# Patient Record
Sex: Male | Born: 1955 | Race: White | Hispanic: No | Smoking: Current every day smoker
Health system: Southern US, Community
[De-identification: ages and names within clinical notes are randomized; demographics above are authoritative.]

## PROBLEM LIST (undated history)

## (undated) DIAGNOSIS — F32A Depression, unspecified: Secondary | ICD-10-CM

## (undated) DIAGNOSIS — D649 Anemia, unspecified: Secondary | ICD-10-CM

## (undated) DIAGNOSIS — K219 Gastro-esophageal reflux disease without esophagitis: Secondary | ICD-10-CM

## (undated) DIAGNOSIS — Z950 Presence of cardiac pacemaker: Secondary | ICD-10-CM

## (undated) DIAGNOSIS — F17200 Nicotine dependence, unspecified, uncomplicated: Secondary | ICD-10-CM

## (undated) DIAGNOSIS — L0390 Cellulitis, unspecified: Secondary | ICD-10-CM

## (undated) DIAGNOSIS — R51 Headache: Secondary | ICD-10-CM

## (undated) DIAGNOSIS — M1990 Unspecified osteoarthritis, unspecified site: Secondary | ICD-10-CM

## (undated) DIAGNOSIS — I4430 Unspecified atrioventricular block: Secondary | ICD-10-CM

## (undated) DIAGNOSIS — L259 Unspecified contact dermatitis, unspecified cause: Secondary | ICD-10-CM

## (undated) DIAGNOSIS — I739 Peripheral vascular disease, unspecified: Secondary | ICD-10-CM

## (undated) DIAGNOSIS — I442 Atrioventricular block, complete: Secondary | ICD-10-CM

## (undated) DIAGNOSIS — R0789 Other chest pain: Secondary | ICD-10-CM

## (undated) DIAGNOSIS — I10 Essential (primary) hypertension: Secondary | ICD-10-CM

## (undated) DIAGNOSIS — R9389 Abnormal findings on diagnostic imaging of other specified body structures: Secondary | ICD-10-CM

## (undated) DIAGNOSIS — J449 Chronic obstructive pulmonary disease, unspecified: Secondary | ICD-10-CM

## (undated) HISTORY — DX: Peripheral vascular disease, unspecified: I73.9

## (undated) HISTORY — DX: Unspecified contact dermatitis, unspecified cause: L25.9

## (undated) HISTORY — DX: Abnormal findings on diagnostic imaging of other specified body structures: R93.89

## (undated) HISTORY — DX: Cellulitis, unspecified: L03.90

## (undated) HISTORY — DX: Depression, unspecified: F32.A

## (undated) HISTORY — DX: Atrioventricular block, complete: I44.2

## (undated) HISTORY — DX: Unspecified atrioventricular block: I44.30

## (undated) HISTORY — DX: Unspecified osteoarthritis, unspecified site: M19.90

## (undated) HISTORY — DX: Chronic obstructive pulmonary disease, unspecified: J44.9

## (undated) HISTORY — DX: Other chest pain: R07.89

## (undated) HISTORY — DX: Headache: R51

## (undated) HISTORY — DX: Nicotine dependence, unspecified, uncomplicated: F17.200

## (undated) HISTORY — DX: Presence of cardiac pacemaker: Z95.0

## (undated) HISTORY — DX: Gastro-esophageal reflux disease without esophagitis: K21.9

## (undated) HISTORY — PX: CATARACT EXTRACTION: SUR2

## (undated) HISTORY — PX: MOUTH SURGERY: SHX715

## (undated) HISTORY — DX: Anemia, unspecified: D64.9

## (undated) HISTORY — DX: Essential (primary) hypertension: I10

---

## 1998-05-08 HISTORY — PX: OTHER SURGICAL HISTORY: SHX169

## 1998-05-31 ENCOUNTER — Ambulatory Visit (HOSPITAL_COMMUNITY): Admission: RE | Admit: 1998-05-31 | Discharge: 1998-06-01 | Payer: Self-pay | Admitting: General Surgery

## 1998-05-31 ENCOUNTER — Encounter: Payer: Self-pay | Admitting: General Surgery

## 2000-05-25 ENCOUNTER — Encounter: Admission: RE | Admit: 2000-05-25 | Discharge: 2000-05-25 | Payer: Self-pay | Admitting: Pulmonary Disease

## 2000-05-25 ENCOUNTER — Encounter: Payer: Self-pay | Admitting: Pulmonary Disease

## 2003-06-12 ENCOUNTER — Emergency Department (HOSPITAL_COMMUNITY): Admission: EM | Admit: 2003-06-12 | Discharge: 2003-06-12 | Payer: Self-pay | Admitting: Emergency Medicine

## 2005-01-10 ENCOUNTER — Ambulatory Visit: Payer: Self-pay | Admitting: Pulmonary Disease

## 2005-01-16 ENCOUNTER — Ambulatory Visit: Payer: Self-pay

## 2005-01-19 ENCOUNTER — Ambulatory Visit: Payer: Self-pay | Admitting: Pulmonary Disease

## 2006-04-16 ENCOUNTER — Ambulatory Visit: Payer: Self-pay | Admitting: Pulmonary Disease

## 2007-03-25 ENCOUNTER — Ambulatory Visit: Payer: Self-pay | Admitting: Pulmonary Disease

## 2007-04-08 ENCOUNTER — Telehealth (INDEPENDENT_AMBULATORY_CARE_PROVIDER_SITE_OTHER): Payer: Self-pay | Admitting: *Deleted

## 2007-04-10 DIAGNOSIS — Z9189 Other specified personal risk factors, not elsewhere classified: Secondary | ICD-10-CM

## 2007-04-10 DIAGNOSIS — R51 Headache: Secondary | ICD-10-CM | POA: Insufficient documentation

## 2007-04-10 DIAGNOSIS — R519 Headache, unspecified: Secondary | ICD-10-CM | POA: Insufficient documentation

## 2007-04-10 DIAGNOSIS — I10 Essential (primary) hypertension: Secondary | ICD-10-CM | POA: Insufficient documentation

## 2007-04-10 HISTORY — DX: Essential (primary) hypertension: I10

## 2007-05-14 ENCOUNTER — Telehealth (INDEPENDENT_AMBULATORY_CARE_PROVIDER_SITE_OTHER): Payer: Self-pay | Admitting: *Deleted

## 2007-09-02 ENCOUNTER — Telehealth (INDEPENDENT_AMBULATORY_CARE_PROVIDER_SITE_OTHER): Payer: Self-pay | Admitting: *Deleted

## 2007-10-30 ENCOUNTER — Telehealth (INDEPENDENT_AMBULATORY_CARE_PROVIDER_SITE_OTHER): Payer: Self-pay | Admitting: *Deleted

## 2007-10-30 ENCOUNTER — Ambulatory Visit: Payer: Self-pay | Admitting: Internal Medicine

## 2007-10-31 DIAGNOSIS — L255 Unspecified contact dermatitis due to plants, except food: Secondary | ICD-10-CM | POA: Insufficient documentation

## 2007-12-16 ENCOUNTER — Telehealth (INDEPENDENT_AMBULATORY_CARE_PROVIDER_SITE_OTHER): Payer: Self-pay | Admitting: *Deleted

## 2008-03-08 HISTORY — PX: PACEMAKER INSERTION: SHX728

## 2008-03-17 ENCOUNTER — Telehealth: Payer: Self-pay | Admitting: Pulmonary Disease

## 2008-03-24 ENCOUNTER — Telehealth (INDEPENDENT_AMBULATORY_CARE_PROVIDER_SITE_OTHER): Payer: Self-pay | Admitting: *Deleted

## 2008-03-26 ENCOUNTER — Ambulatory Visit: Payer: Self-pay | Admitting: Cardiovascular Disease

## 2008-03-26 ENCOUNTER — Ambulatory Visit: Payer: Self-pay | Admitting: Pulmonary Disease

## 2008-03-26 ENCOUNTER — Inpatient Hospital Stay (HOSPITAL_COMMUNITY): Admission: EM | Admit: 2008-03-26 | Discharge: 2008-03-28 | Payer: Self-pay | Admitting: Emergency Medicine

## 2008-03-26 DIAGNOSIS — J449 Chronic obstructive pulmonary disease, unspecified: Secondary | ICD-10-CM

## 2008-03-26 DIAGNOSIS — R930 Abnormal findings on diagnostic imaging of skull and head, not elsewhere classified: Secondary | ICD-10-CM

## 2008-03-26 DIAGNOSIS — J4489 Other specified chronic obstructive pulmonary disease: Secondary | ICD-10-CM | POA: Insufficient documentation

## 2008-03-26 DIAGNOSIS — F17200 Nicotine dependence, unspecified, uncomplicated: Secondary | ICD-10-CM | POA: Insufficient documentation

## 2008-03-26 DIAGNOSIS — K219 Gastro-esophageal reflux disease without esophagitis: Secondary | ICD-10-CM | POA: Insufficient documentation

## 2008-03-26 HISTORY — DX: Chronic obstructive pulmonary disease, unspecified: J44.9

## 2008-03-26 HISTORY — DX: Other specified chronic obstructive pulmonary disease: J44.89

## 2008-03-26 HISTORY — DX: Gastro-esophageal reflux disease without esophagitis: K21.9

## 2008-03-27 ENCOUNTER — Ambulatory Visit: Payer: Self-pay | Admitting: Surgery

## 2008-03-27 ENCOUNTER — Encounter: Payer: Self-pay | Admitting: Cardiovascular Disease

## 2008-04-01 ENCOUNTER — Telehealth (INDEPENDENT_AMBULATORY_CARE_PROVIDER_SITE_OTHER): Payer: Self-pay | Admitting: *Deleted

## 2008-04-15 ENCOUNTER — Ambulatory Visit: Payer: Self-pay

## 2008-04-15 ENCOUNTER — Encounter: Payer: Self-pay | Admitting: Pulmonary Disease

## 2008-05-13 ENCOUNTER — Telehealth: Payer: Self-pay | Admitting: Pulmonary Disease

## 2008-06-04 ENCOUNTER — Ambulatory Visit: Payer: Self-pay | Admitting: Internal Medicine

## 2008-06-04 ENCOUNTER — Emergency Department (HOSPITAL_COMMUNITY): Admission: EM | Admit: 2008-06-04 | Discharge: 2008-06-04 | Payer: Self-pay | Admitting: Emergency Medicine

## 2008-06-12 ENCOUNTER — Telehealth (INDEPENDENT_AMBULATORY_CARE_PROVIDER_SITE_OTHER): Payer: Self-pay | Admitting: *Deleted

## 2008-06-16 ENCOUNTER — Encounter: Payer: Self-pay | Admitting: Internal Medicine

## 2008-06-30 ENCOUNTER — Ambulatory Visit: Payer: Self-pay | Admitting: Internal Medicine

## 2008-08-10 ENCOUNTER — Ambulatory Visit: Payer: Self-pay | Admitting: Internal Medicine

## 2008-08-10 ENCOUNTER — Telehealth (INDEPENDENT_AMBULATORY_CARE_PROVIDER_SITE_OTHER): Payer: Self-pay | Admitting: *Deleted

## 2008-11-26 ENCOUNTER — Ambulatory Visit: Payer: Self-pay | Admitting: Pulmonary Disease

## 2008-11-26 DIAGNOSIS — Z950 Presence of cardiac pacemaker: Secondary | ICD-10-CM | POA: Insufficient documentation

## 2008-11-26 DIAGNOSIS — I459 Conduction disorder, unspecified: Secondary | ICD-10-CM

## 2008-11-26 HISTORY — DX: Conduction disorder, unspecified: I45.9

## 2009-04-12 ENCOUNTER — Telehealth (INDEPENDENT_AMBULATORY_CARE_PROVIDER_SITE_OTHER): Payer: Self-pay | Admitting: *Deleted

## 2009-04-15 ENCOUNTER — Ambulatory Visit: Payer: Self-pay | Admitting: Pulmonary Disease

## 2009-04-18 LAB — CONVERTED CEMR LAB
ALT: 22 units/L (ref 0–53)
AST: 19 units/L (ref 0–37)
Alkaline Phosphatase: 81 units/L (ref 39–117)
Basophils Relative: 0.5 % (ref 0.0–3.0)
Bilirubin, Direct: 0.1 mg/dL (ref 0.0–0.3)
CO2: 30 meq/L (ref 19–32)
Calcium: 9.8 mg/dL (ref 8.4–10.5)
Cholesterol: 197 mg/dL (ref 0–200)
Eosinophils Relative: 1.2 % (ref 0.0–5.0)
Glucose, Bld: 113 mg/dL — ABNORMAL HIGH (ref 70–99)
HDL: 30.2 mg/dL — ABNORMAL LOW (ref >39.00)
Lymphocytes Relative: 33.1 % (ref 12.0–46.0)
MCV: 99.2 fL (ref 78.0–100.0)
Monocytes Absolute: 0.6 10*3/uL (ref 0.1–1.0)
Monocytes Relative: 6.5 % (ref 3.0–12.0)
Neutrophils Relative %: 58.7 % (ref 43.0–77.0)
Platelets: 267 10*3/uL (ref 150.0–400.0)
Potassium: 4.7 meq/L (ref 3.5–5.1)
Sodium: 140 meq/L (ref 135–145)
Total Bilirubin: 0.8 mg/dL (ref 0.3–1.2)
Total Protein: 7.1 g/dL (ref 6.0–8.3)

## 2009-04-20 ENCOUNTER — Encounter (INDEPENDENT_AMBULATORY_CARE_PROVIDER_SITE_OTHER): Payer: Self-pay | Admitting: *Deleted

## 2009-04-23 ENCOUNTER — Ambulatory Visit: Payer: Self-pay | Admitting: Gastroenterology

## 2009-05-03 ENCOUNTER — Encounter (INDEPENDENT_AMBULATORY_CARE_PROVIDER_SITE_OTHER): Payer: Self-pay | Admitting: *Deleted

## 2009-05-06 ENCOUNTER — Encounter: Payer: Self-pay | Admitting: Pulmonary Disease

## 2009-05-06 ENCOUNTER — Ambulatory Visit: Payer: Self-pay

## 2009-05-13 ENCOUNTER — Ambulatory Visit: Payer: Self-pay | Admitting: Internal Medicine

## 2009-09-14 ENCOUNTER — Telehealth: Payer: Self-pay | Admitting: Pulmonary Disease

## 2010-01-03 ENCOUNTER — Encounter (INDEPENDENT_AMBULATORY_CARE_PROVIDER_SITE_OTHER): Payer: Self-pay | Admitting: *Deleted

## 2010-06-07 NOTE — Assessment & Plan Note (Signed)
Summary: 9 MO F/U  Medications Added LOSARTAN POTASSIUM 100 MG TABS (LOSARTAN POTASSIUM) take 1 tab by mouth once daily.../ not started yet MOVIPREP 100 GM  SOLR (PEG-KCL-NACL-NASULF-NA ASC-C) As per prep instructions./has not taken yet.      Allergies Added: NKDA  History of Present Illness: Andrew Wiggins returns today for followup.  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  He has ongoing tobacco abuse but is trying to stop smoking.  He is down to less than half pack a day.  He denies c/p, sob or peripheral edema.  Current Medications (verified): 1)  Bufferin Low Dose 81 Mg Tbec (Aspirin) .... Take 1 Tab By Mouth Once Daily.Marland KitchenMarland Kitchen 2)  Losartan Potassium 100 Mg Tabs (Losartan Potassium) .... Take 1 Tab By Mouth Once Daily.../ Not Started Yet 3)  Moviprep 100 Gm  Solr (Peg-Kcl-Nacl-Nasulf-Na Asc-C) .... As Per Prep Instructions.Mosetta Pigeon Not Taken Yet.  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    Vital Signs:  Patient profile:   55 year old male Height:      70 inches Weight:      160 pounds BMI:     23.04 Pulse rate:   81 / minute BP sitting:   158 / 74  (left arm)  Vitals Entered By: Laurance Flatten CMA (May 13, 2009 12:17 PM)  Physical Exam  General:  Well  developed, well nourished, in no acute distress.  HEENT: normal Neck: supple. No JVD. Carotids 2+ bilaterally no bruits Cor: RRR no rubs, gallops or murmur Lungs: CTA.  Well healed PPM incision. Ab: soft, nontender. nondistended. No HSM. Good bowel sounds Ext: warm. no cyanosis, clubbing or edema Neuro: alert and oriented. Grossly nonfocal. affect pleasant    PPM Specifications Following MD:  Andrew Bunting, MD     PPM Vendor:  Medtronic     PPM Model Number:  [id number]     PPM Serial Number:  [id number] PPM DOI:  03/27/2008      Lead 1    Location: RA     DOI: 03/27/2008     Model #: [id number]     Serial #: [id number]     Status: active Lead 2    Location: RV     DOI: 03/27/2008     Model #: [id number]     Serial #: [id number]     Status: active  Magnet Response Rate:  BOL 85 ERI  65  Indications:  2:1HB   PPM Follow Up Remote Check?  No Battery Voltage:  2.80 V     Battery Est. Longevity:  9 YEARS     Pacer Dependent:  No       PPM Device Measurements Atrium  Amplitude: 2.8 mV, Impedance: 497 ohms, Threshold: 0.5 V at 0.4 msec Right Ventricle  Amplitude: 11.2 mV, Impedance: 744 ohms, Threshold: 0.625 V at 0.4 msec  Episodes MS Episodes:  15     Percent Mode Switch:  <0.1%     Coumadin:  No Ventricular High Rate:  1     Atrial Pacing:  2.2%     Ventricular Pacing:  99.9%  Parameters Mode:  DDDR     Lower Rate Limit:  60     Upper Rate Limit:  130 Paced AV Delay:  150     Sensed AV Delay:  120 Next Cardiology Appt Due:  11/05/2009 Tech Comments:  Normal device function.  Pt in SR today, will D/W Dr Ladona Ridgel need to reprogram to promote intrinsic conduction.  VHR episode is VT with a cycle length of 250 msec.  Longest mode switch episode 33 minutes, likely atrial tach.  No [facility]link because of no land line.  ROV 6 months device clinic. Gypsy Balsam, RN, BSN  May 13, 2009 12:20 PM  MD Comments:  Agree with above.  Impression & Recommendations:  Problem # 1:  CARDIAC PACEMAKER IN SITU  (ICD-V45.01) His device is working normally today.  will recheck his device in several months.  Problem # 2:  HYPERTENSION (ICD-401.9) A low sodium diet is requested.  Continue current meds. His updated medication list for this problem includes:    Bufferin Low Dose 81 Mg Tbec (Aspirin) .[name]... Take 1 tab by mouth once daily...    Losartan Potassium 100 Mg Tabs (Losartan potassium) .[name]... Take 1 tab by mouth once daily.../ not started yet  Problem # 3:  CIGARETTE SMOKER (ICD-305.1) I have continued to ask him to try and stop smoking.

## 2010-06-07 NOTE — Progress Notes (Signed)
Summary: nos appt  Phone Note Call from Patient   Caller: [email or web address]  Call For: nadel Summary of Call: LMTCB x2 to rsc 5/9 nos. Initial call taken by: Darletta Moll,  Sep 14, 2009 1:24 PM

## 2010-06-07 NOTE — Letter (Signed)
Summary: Device-Delinquent Check    [address], [place] [address]   Phone: [phone]  Fax: [phone]     January 03, 2010 MRN: [id number]   [facility] [address] [place], [place]  [address]   Dear Mr. Hallum,  According to our records, you have not had your implanted device checked in the recommended period of time.  We are unable to determine appropriate device function without checking your device on a regular basis.  Please call our office to schedule an appointment, with the device clinic,  as soon as possible.  If you are having your device checked by another physician, please call us so that we may update our records.  Thank you,  Altha Harm, LPN  January 03, 2010 4:25 PM  [facility]

## 2010-06-13 ENCOUNTER — Telehealth (INDEPENDENT_AMBULATORY_CARE_PROVIDER_SITE_OTHER): Payer: Self-pay | Admitting: *Deleted

## 2010-06-23 NOTE — Progress Notes (Signed)
Summary: refill of blood pressure meds  Phone Note Call from Patient   Caller: Patient Call For: NADEL Summary of Call: Patient phoned and stated that he is out of work and doesnt have any insurance wants to know if you could talk to dr Kriste Basque to see if Dr Kriste Basque would refill his blood pressure medication with out seeing him since he doesnt have insurance and cant really afford an office visit. Patient can be reached at [phone] Patient really needs this medication because he ran out in December Initial call taken by: Vedia Coffer,  June 13, 2010 3:18 PM  Follow-up for Phone Call        Spoke with pt.  He states that he has been out of his BP med since Dec 2011.  He was last seen here in Dec 2010 and has since then lost his job and has no ins.  He states can not afford appt, but wants to know if SN can refill losartan anyway.  Pls advise thanks Follow-up by: Vernie Murders,  June 13, 2010 3:53 PM  Additional Follow-up for Phone Call Additional follow up Details #1::        Per TP- can not refill meds without ov.  Can mention to SN that has no ins and he may help with bill.  I called pt and notified of this and he still refused to sched appt.  Additional Follow-up by: Vernie Murders,  June 13, 2010 5:32 PM

## 2010-07-05 ENCOUNTER — Telehealth (INDEPENDENT_AMBULATORY_CARE_PROVIDER_SITE_OTHER): Payer: Self-pay | Admitting: *Deleted

## 2010-07-14 NOTE — Progress Notes (Signed)
Summary: bp is beginning to go up > lisinopril rx, OV scheduled  Phone Note Call from Patient   Caller: Patient Call For: NADEL Summary of Call: Patient phoned regarding his blood pressure and his bp medication. Patient is unemployed and doesnt have any insurance but his blood pressure is beginning to go up he stated that last night it was 183/86 and it came down to 166/85 then before he went to bed it was 155/85 this morning it was 146/84 he can be reached at [phone] Initial call taken by: Vedia Coffer,  July 05, 2010 10:48 AM  Follow-up for Phone Call        Pt's last OV with SN - 04/2009.    Called, spoke with pt.  States he has been out of bp med since approx December d/t no insurance and unemployed at this time.  It is normally arougn 146/84 but states yesterday it went up to the 180's.  States it did come back down into the 160's before he went to bed.  States he did have slight dizziness yesteday but denies blurred vision.  None today - bp 146/84 this am.  He is ok to schedule appt but first availabe with SN is not until 08/03/10.  Pt does not want to wait this long.  Pls advise if he can be worked in sooner and if bp med can be sent in to last until OV -- Dr. Kriste Basque, pls advise.  Thanks!  [facility] in [place] Follow-up by: Gweneth Dimitri RN,  July 05, 2010 11:50 AM  Additional Follow-up for Phone Call Additional follow up Details #1::        per SN----he was on losartan---can change this to lisinopril 20mg   #30   1 daily---this is on the $4 list at [facility] and [facility] or $10 for #90 day supply----he needs to quit smoking and no salt in his diet---will need follow up with SN and Dr. Thereasa Distance follow up of pacer for heart block.  thanks Randell Loop CMA  July 05, 2010 12:41 PM     Additional Follow-up for Phone Call Additional follow up Details #2::    Called, spoke with pt.  He was informed of above recs per SN to change losartan to lisinopril, quit smoking, and no salt  diet.  Aware lisinopril 20mg  once daily #30 x 0 sent to [facility] in [place] and this is on their $4 list.  OV scheduled with SN on March 21 at 4pm -- pt aware he will have to keep OV for additional rxs.  He verbalized understanding of instructions and also aware he needs to call Dr. Lubertha Basque office for f/u.  Pt will call back if he has any further questions before scheduled ov. Follow-up by: Gweneth Dimitri RN,  July 05, 2010 2:02 PM  New/Updated Medications: LISINOPRIL 20 MG TABS (LISINOPRIL) Take 1 tablet by mouth once a day Prescriptions: LISINOPRIL 20 MG TABS (LISINOPRIL) Take 1 tablet by mouth once a day  #30 x 0   Entered by:   Gweneth Dimitri RN   Authorized by:   Michele Mcalpine MD   Signed by:   Gweneth Dimitri RN on 07/05/2010   Method used:   Electronically to        [facility] (retail)       [address]       [place], [place]  [address]       Ph: [phone]  Fax: [phone]   RxID:   [id number]

## 2010-07-27 ENCOUNTER — Ambulatory Visit (INDEPENDENT_AMBULATORY_CARE_PROVIDER_SITE_OTHER): Payer: Self-pay | Admitting: Pulmonary Disease

## 2010-07-27 ENCOUNTER — Encounter: Payer: Self-pay | Admitting: Pulmonary Disease

## 2010-07-27 ENCOUNTER — Encounter: Payer: Self-pay | Admitting: Adult Health

## 2010-07-27 DIAGNOSIS — I779 Disorder of arteries and arterioles, unspecified: Secondary | ICD-10-CM

## 2010-07-27 DIAGNOSIS — J449 Chronic obstructive pulmonary disease, unspecified: Secondary | ICD-10-CM

## 2010-07-27 DIAGNOSIS — I10 Essential (primary) hypertension: Secondary | ICD-10-CM

## 2010-07-27 DIAGNOSIS — I459 Conduction disorder, unspecified: Secondary | ICD-10-CM

## 2010-07-27 HISTORY — DX: Disorder of arteries and arterioles, unspecified: I77.9

## 2010-07-27 NOTE — Progress Notes (Signed)
  Subjective:    Patient ID: Andrew Wiggins, male    DOB: Aug 30, 1955, 55 y.o.   MRN: [id number]  HPI 55 y/o WM here for a follow up visit... 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  ~  July 27, 2010:  15 month ROV- he lost his job & doesn't have insurance therefore let his BP meds run out & hasn't been back to [facility] for pacer check either;  He called last month & we changed his Micardis to LISINOPRIL 20mg /d;  He's been checking his BP at home & prev 160-180/ 80-90 off meds has improved to the 140/80 range now;  We discussed the need to elim salt/ sodium & quit smoking (1ppd = very expensive) as well... He will monitor his BP at home & call me if >150/90 for additional med (?incr Lisin to 40, or add HCT 12.5?);  He promises to f/u w/ Cards as well...    Carotid dis>  Prev CDoppler w/ severe plaque on right ICA, he is taking ASA daily, he knows the contribution from smoking & the benefits of quitting, no cerebral ischemic symptoms & we discussed what to watch out for...    Health Maintenance>  He had Pneumovax 12/10;  Needs yearly Flu vaccines & so advised;  Needs baseline colonoscopy, DRE, & PSA...   Review of Systems    Constitutional:  Denies F/C/S, anorexia, unexpected weight change. HEENT:  No HA, visual changes, earache, nasal symptoms, sore throat, hoarseness. Resp:  min cough & sputum; no hemoptysis; no SOB, tightness, wheezing. Cardio:  No CP, palpit, DOE, orthopnea, edema. GI:  Denies N/V/D/C or blood in stool; no reflux, abd pain, distention, or gas. GU:  No dysuria, freq, urgency, hematuria, or flank pain. MS:  Denies joint pain, swelling, tenderness, or decr ROM; no neck pain, back pain, etc. Neuro:  No tremors, seizures, dizziness, syncope, weakness, numbness,  gait abn. Skin:  No suspicious lesions or skin rash. Heme:  No adenopathy, bruising, bleeding. Psyche: Denies confusion, sleep disturbance, hallucinations, anxiety, depression.    Objective:   Physical Exam   WD, WN, 55 y/o WM in NAD... Vital Signs:  Reviewed... BP recheck 142/ 80... General:  Alert & oriented; pleasant & cooperative... HEENT:  Jerome/AT, EOM-wnl, PERRLA, Fundi-benign, EACs-clear, TMs-wnl, NOSE-clear, THROAT-clear & wnl. Neck:  Supple w/ full ROM; no JVD; normal carotid impulses & faint right neck bruit; no thyromegaly or nodules palpated; no lymphadenopathy. Chest:  Clear to P & A; without wheezes/ rales/ or rhonchi heard... Heart:  Regular Rhythm; norm S1 & S2 without murmurs/ rubs/ or gallops detected... Abdomen:  Soft & nontender; normal bowel sounds; no organomegaly or masses palpated... Ext:  Normal ROM; without deformities or arthritic changes; no varicose veins, venous insuffic, or edema... Derm:  No lesions noted; no rash etc... Lymph:  No cervical, supraclavicular, axillary, or inguinal adenopathy palpated...    Assessment & Plan:

## 2010-07-27 NOTE — Assessment & Plan Note (Signed)
He has been on the Lisinopril 20mg /d for about a month & home BP checks are 140/80 range... Rx refilled for 90d supplies & rec to continue home BP monitoring & call if readings >150/90 for adjustment in therapy.Marland KitchenMarland Kitchen

## 2010-07-27 NOTE — Patient Instructions (Signed)
We refilled your LISINOPRIL 20mg  for 90d supply... Be sure to eliminate the salt/ sodium from your diet... Continue to monitor your BP at home and call if BP is staying >150/90... Andrew Wiggins, you need to quit the smoking!!! Call for any questions... Let's plan a follow up visit in 1 year, sooner if needed.Marland KitchenMarland Kitchen

## 2010-07-27 NOTE — Assessment & Plan Note (Addendum)
We discussed smoking cessation & reviewed his prev CXR & PFT... He does not want repeat eval due to lack of insurance & expense.

## 2010-07-27 NOTE — Assessment & Plan Note (Signed)
His exam is essent neg w/o signif bruit on exam & normal carotid impulses;  No cerebral ischemic symtoms;  He is advised to quit smoking & continue the ASA daily.Marland KitchenMarland Kitchen

## 2010-07-27 NOTE — Assessment & Plan Note (Signed)
He has not had any arrhythmia issues or pacer problems but he is overdue for f/u w/ Cards & promises to set this up as soon as he is able.Marland KitchenMarland Kitchen

## 2010-08-22 LAB — BASIC METABOLIC PANEL
BUN: 13 mg/dL (ref 6–23)
CO2: 23 mEq/L (ref 19–32)
Chloride: 106 mEq/L (ref 96–112)
Creatinine, Ser: 0.84 mg/dL (ref 0.4–1.5)
Glucose, Bld: 162 mg/dL — ABNORMAL HIGH (ref 70–99)
Potassium: 5 mEq/L (ref 3.5–5.1)

## 2010-08-22 LAB — HEPATIC FUNCTION PANEL
ALT: 37 U/L (ref 0–53)
Albumin: 3.9 g/dL (ref 3.5–5.2)
Alkaline Phosphatase: 72 U/L (ref 39–117)
Indirect Bilirubin: 1 mg/dL — ABNORMAL HIGH (ref 0.3–0.9)
Total Bilirubin: 1.4 mg/dL — ABNORMAL HIGH (ref 0.3–1.2)
Total Protein: 6.8 g/dL (ref 6.0–8.3)

## 2010-08-22 LAB — DIFFERENTIAL
Basophils Relative: 0 % (ref 0–1)
Eosinophils Absolute: 0.1 10*3/uL (ref 0.0–0.7)
Eosinophils Relative: 1 % (ref 0–5)
Lymphs Abs: 0.4 10*3/uL — ABNORMAL LOW (ref 0.7–4.0)
Neutrophils Relative %: 94 % — ABNORMAL HIGH (ref 43–77)

## 2010-08-22 LAB — URINALYSIS, ROUTINE W REFLEX MICROSCOPIC
Bilirubin Urine: NEGATIVE
Glucose, UA: NEGATIVE mg/dL
Hgb urine dipstick: NEGATIVE
Specific Gravity, Urine: 1.023 (ref 1.005–1.030)

## 2010-08-22 LAB — POCT CARDIAC MARKERS: Myoglobin, poc: 51.3 ng/mL (ref 12–200)

## 2010-08-22 LAB — CBC
HCT: 51 % (ref 39.0–52.0)
MCHC: 33.7 g/dL (ref 30.0–36.0)
MCV: 96.8 fL (ref 78.0–100.0)
Platelets: 224 10*3/uL (ref 150–400)
WBC: 16.2 10*3/uL — ABNORMAL HIGH (ref 4.0–10.5)

## 2010-09-20 NOTE — H&P (Signed)
NAMEJACQUIS, Andrew Wiggins                 ACCOUNT NO.:  [id number]   MEDICAL RECORD NO.:  [id number]          PATIENT TYPE:  INP   LOCATION:  3313                         FACILITY:  [facility]   PHYSICIAN:  Jason Coop, MD DATE OF BIRTH:  02-Nov-1955   DATE OF ADMISSION:  03/26/2008  DATE OF DISCHARGE:                              HISTORY & PHYSICAL   CHIEF COMPLAINT:  Dizziness and weakness.   HISTORY OF PRESENT ILLNESS:  Andrew Wiggins is a 55 year old man with a  history of hypertension who was referred to ER by Dr. Kriste Basque after he was  found to have second-degree AV block in his office.  Over the last few  weeks, Andrew Wiggins was found to have systolic blood pressure in 170s at  his workplace.  He was also a little dizzy.  He was taking Diovan 80 mg  p.o. daily until then.  Then, he called Dr. Jodelle Green office who is his  primary care doctor and his Diovan dose was doubled to 160 mg p.o.  daily.  Then he started to have heart rate in 40s again found at his  workplace, but his blood pressure did not go down.  He still had some  dizziness, but there is no chest pain, shortness of breath, palpitation,  blurred vision, loss of consciousness, presyncope, fever, chills,  nausea, vomiting, or perspiration.   ALLERGY HISTORY:  He is allergic to NERVE MEDICATIONS, which cause him  completely numb down his legs.   PAST MEDICAL HISTORY:  Positive for hypertension, COPD, status post  cholecystectomy, and ongoing tobacco abuse.   HOME MEDICATIONS:  Diovan 160 mg p.o. daily.   SOCIAL HISTORY:  Andrew Wiggins live in [place] with himself.  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  He is separated and he has  ongoing tobacco abuse of 35 pack-years.  He does not drink alcohol or  drugs.  He does not use illicit drugs.   FAMILY HISTORY:  Noncontributory.   REVIEW OF SYSTEMS:  As per HPI.   PHYSICAL EXAMINATION:  VITAL SIGNS:  Temperature 98.2; pulse 41;  respirations 20; blood pressure 201/91,  repeat 176/69; and oxygenation  saturation 99 on room air.  GENERAL:  He is not in acute distress.  HEENT:  PERRLA, Extraocular muscles were intact.  Oropharynx without  erythema or exudate.  NECK:  Supple without JVD but is positive for bilateral carotid bruits.  CARDIOVASCULAR:  First and second heart sounds normal.  Regular rate and  rhythm.  There is grade 4/6 systolic murmur all over the precordial  area, more prominent on the right second intercostal space in the apex.  There is a bilateral femoral bruit as well.  Bilateral pedal pulses are  2+.  LUNGS:  Clear to auscultation bilaterally without crackles or wheeze.  ABDOMEN:  Soft and nontender.  No organomegaly.  EXTREMITIES:  No pedal pitting edema.  NEUROLOGIC:  Alert and oriented x3.   A 12-lead EKG done today is positive for heart rate of 41 with 2:1 AV  block with incomplete right bundle-branch block.  This is completely  new  compared with last one from December 2007.  Labs and chest x-ray at the  time of dictation are pending.   ASSESSMENT/PLAN:  Andrew Wiggins is a 55 year old gentleman with a history of  hypertension admitted for:  1. 2:1 block.  We will admit him to step-down versus intensive care      unit depending on the bed availability.  We will try an external      pacemaker and we will also keep him at bedside IV atropine.  We      will check 2D echocardiogram.  We will try to get permanent      pacemaker by early morning tomorrow.  We will also try to get      cardiac enzymes and  followup EKG.  We will start him on aspirin      and check potassium and magnesium.  We will also check TSH.  He may      or may not need cardiac catheterization, which will be decided      later.  2. Heart murmur/carotid bruit.  We will check 2D echocardiogram and      carotid Doppler and follow from there.      Jason Coop, MD  Electronically Signed     YP/MEDQ  D:  03/26/2008  T:  03/27/2008  Job:  [id number]   cc:    Lonzo Cloud. Kriste Basque, MD

## 2010-09-20 NOTE — Discharge Summary (Signed)
NAMETUCKER, MINTER                 ACCOUNT NO.:  [id number]   MEDICAL RECORD NO.:  [id number]          PATIENT TYPE:  INP   LOCATION:  3313                         FACILITY:  [facility]   PHYSICIAN:  Madolyn Frieze. Jens Som, MD, FACCDATE OF BIRTH:  13-Jan-1956   DATE OF ADMISSION:  03/26/2008  DATE OF DISCHARGE:  03/28/2008                               DISCHARGE SUMMARY   DISCHARGING PHYSICIAN:  Madolyn Frieze. Jens Som, MD, [facility]   PRIMARY CARE:  Lonzo Cloud. Kriste Basque, MD   ELECTROPHYSIOLOGY:  Doylene Canning. Ladona Ridgel, MD   DISCHARGING DIAGNOSIS:  A 2:1 atrioventricular block status post  permanent pacemaker with a Medtronic Adapta device in the setting of  dizziness and weakness.   PAST MEDICAL HISTORY:  Hypertension, COPD, status post cholecystectomy,  ongoing tobacco abuse.   HOSPITAL COURSE:  Mr. Mooney presented on day of admission after being  referred to the ER by Dr. Kriste Basque as the patient was found to have a  second degree AV block in his office associated with dizziness, heart  rate in the 40s.  The patient was admitted for 2:1 AV block.  A 2-D  echocardiogram obtained.  Echocardiogram showed EF of 55-60% mild  diastolic dysfunction, mild mitral valve regurgitation.  The patient  underwent placement of a permanent pacemaker by Dr. Lewayne Bunting on  March 27, 2008.  DDD permanent pacemaker implanted via left  subclavian vein.  The patient tolerated the procedure without  complications.  The patient also underwent a carotid Doppler showing no  ICA stenosis bilaterally.  On day of discharge, the patient is afebrile,  blood pressure 110/84, heart rate 79, sat 95% on room air.  Chest x-ray  obtained showed status post placement of a dual lead pacemaker without  evidence of complication.  The patient being discharged home to follow  up with Dr. Ladona Ridgel on February 23, at 11:30.  He will have a wound check  in the pacer clinic.  Before that time, office will call to arrange  appointment.  Followup with Dr.  Kriste Basque as previously prescribed, will  need outpatient Myoview in 4-6 weeks, this will be arranged through the  office.  The patient Myoview being arranged secondary to mild bump in  troponin most likely occurred following pacemaker insertion, troponin of  0.11 at time of discharge.  We will arrange for office to set up a  stress Myoview also.  The patient continue Diovan 160 mg daily as  previously prescribed.   DURATION OF DISCHARGE ENCOUNTERED:  Less than 30 minutes.      Dorian Pod, ACNP      Madolyn Frieze. Jens Som, MD, [facility]  Electronically Signed   MB/MEDQ  D:  03/28/2008  T:  03/28/2008  Job:  [id number]

## 2010-09-20 NOTE — Consult Note (Signed)
Andrew Wiggins, Andrew Wiggins.:  [id number]   MEDICAL RECORD Wiggins.:  [id number]          PATIENT TYPE:  EMS   LOCATION:  MAJO                         FACILITY:  [facility]   PHYSICIAN:  Pricilla Riffle, MD, FACCDATE OF BIRTH:  07/18/1955   DATE OF CONSULTATION:  06/04/2008  DATE OF DISCHARGE:                                 CONSULTATION   [name] is a 55 year old male.  He has a past medical history of  hypertension.  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  His symptoms were  dizziness.  He received a Medtronic dual-chamber pacemaker in November  2009.  As part of that admission, he had an echocardiogram on March 27, 2008, which showed an ejection fraction of 55-60%, Wiggins left  ventricular wall motion abnormalities, trivial tricuspid regurgitation,  mild mitral regurgitation, and mild diastolic dysfunction.  He had a  stress test in April 16, 2008, which was negative per patient recall.   On the morning of this admission, January 28, the patient awoke at about  2:30 in the morning.  He was nauseated.  He felt a fluttering of rapid  heart palpitations which lasted about a minute and then calmed down.  He  felt that his face was numb bilaterally and felt swollen.  Since that  time, he has been having sporadic abdominal cramping.  This seems to be  more located in the left lower quadrant.  He has been feeling a little  dizzy.  He ate Wiggins breakfast.  He had one episode of diarrhea at about  6:30 in the morning.  He came to the emergency room.   Telemetry on arriving in the emergency room showed sinus tachycardia.  There has been Wiggins significant ectopy if any.  The patient had a complete  blood count which showed white cells at 16.2, hemoglobin 17.2, and  hematocrit 51.  His serum glucose which was a fasting glucose since he  had Wiggins breakfast was 162.  He did have a temperature spike to 100.4.  Blood  pressure is 133/76 and heart rate at that time of exam was about  104 beats per minute.  The patient has Wiggins known drug allergies.  His  only medication is Diovan 160 mg daily.   PAST MEDICAL HISTORY:  1. Hypertension.  2. 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      The patient had a Medtronic dual-chamber pacemaker implanted in      November 2009, Dr. Lewayne Bunting.  3. Ongoing tobacco habituation.  4. Incomplete right bundle-ranch block.  5. Increased fasting glucose on a.m. BMET today and hemoglobin A1c in      November 2009 was 6.5.  6. The patient has dyslipidemia with a very low HDL.  His HDL      cholesterol on November 20, was 19.  7. He also has a history of diverticulitis which he says had a flare      about 2 years ago.   SOCIAL HISTORY:  He lives  in [place], he lives alone.  Works in  Teaching laboratory technician.  He smokes just a little less than one pack per  day, has done so for lifelong.  Wiggins alcoholic beverages.  Wiggins drugs.   FAMILY HISTORY:  Posey Rea of his mother and father since he was raised as  a foster child.  He has some acquaintance with his siblings.  Two  brothers died of leukemia, both had heart problems apparently.  One  sister is living.  She had open heart surgery for congenital heart  disease at age 69.   REVIEW OF SYSTEMS:  The patient is having fevers, but not chills or  rigors.  HEENT:  He complains of a rushing sound in both ears, day and  night which has occurred since the stress test in April 16, 2008.  He  does have dizziness and especially today, he has felt dizzy.  INTEGUMENT:  Wiggins rashes or lesions.  CARDIOPULMONARY:  Wiggins chest pain.  Wiggins  shortness of breath.  Wiggins dyspnea on exertion.  Wiggins orthopnea.  Wiggins  paroxysmal nocturnal dyspnea.  He is not having any lower extremity  edema.  He is not complaining of presyncope or syncope.  He does have  palpitations which lasted about 1 minute when he first awoke nauseated  this morning  at 2:30.  UROGENITAL:  Wiggins evidence of dysuria, hematuria,  straining, or nocturia.  NEUROPSYCHIATRIC:  The patient complaining of  bilateral facial numbness when he first awoke and a feeling of swelling  in the head.  MUSCULOSKELETAL:  Wiggins arthralgias or joint swelling or  deformity on patient inquiry.  GI:  The patient had nausea this morning  and is still nauseated to some extent.  He had one episode of diarrhea  about 6:30.  He is having a cramping abdominal pain more located on the  left lower quadrant though on palpation is not having frank guarding.  He had minimal rebound.  ENDOCRINE:  Wiggins polyuria.  Wiggins polydipsia.  Wiggins  heat or cold intolerance.   PHYSICAL EXAMINATION:  VITAL SIGNS:  Temperature 100.4, blood pressure  133/76, respirations 19, pulse is 104, and oxygen saturation 94% on room  air.  GENERAL:  The patient is alert and oriented x3, in Wiggins acute distress.  HEENT:  Normocephalic and atraumatic.  Eyes, pupils equal, round, and  reactive to light.  The patient does respond to sinus pressure on  palpation of the maxillary sinus area.  NECK:  Supple.  Wiggins carotid bruits auscultated.  Wiggins jugular venous  distention.  HEART:  Regular rate and rhythm with normal S1 and S2.  Wiggins S3, S4,  murmur, rub, or gallop.  LUNGS:  Clear to auscultation and percussion bilaterally.  INTEGUMENT:  Wiggins rashes or lesions.  ABDOMEN:  Tender left lower quadrant slightly.  Wiggins hepatosplenomegaly.  There is only slight rebound.  Wiggins guarding.  Bowel sounds are present.  UROGENITAL:  Deferred.  RECTAL:  Also, deferred.  EXTREMITIES:  Wiggins evidence of edema.  Pulses 2+ bilaterally.  MUSCULOSKELETAL:  Wiggins joint deformities, effusions, or costovertebral  angle tenderness.  NEUROLOGIC:  Grossly intact.  Alert and oriented x3.   Chest x-ray shows Wiggins acute process.   LABORATORY STUDIES:  White cells 16.2, hemoglobin 17.2, hematocrit 51,  and platelets of 224.  Sodium 136, potassium 5, chloride 106, carbonate   23, BUN is 13, creatinine 0.84, and glucose 162.  TSH in the past in  March 26, 2008, 2.017.  Lipids, March 27, 2009,  cholesterol 145  with triglyceride of 143, HDL cholesterol 19, and LDL cholesterol 97.   IMPRESSION:  The patient admitted with sinus tachycardia on telemetry.  We will have the pacemaker interrogated.  There is Wiggins evidence on  examination for a primary cardiac problem.  His sinus tachycardia is  probably a response to his abdominal process which needs further  evaluation.   PLAN:  We will follow up on pacer interrogation and we ask that he  continue on telemetry while he is here, possibly follow up on borderline  glucose intolerance.  We would appreciate General Medicine to help in  this.     Maple Mirza, PA      Pricilla Riffle, MD, [facility]  Electronically Signed   GM/MEDQ  D:  06/04/2008  T:  06/05/2008  Job:  [id number]

## 2010-09-20 NOTE — Assessment & Plan Note (Signed)
[facility]                         ELECTROPHYSIOLOGY OFFICE NOTE   MELQUAN, ERNSBERGER                        MRN:          [id number]  DATE:06/30/2008                            DOB:          [birth year]    [name] returns today for followup.  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  The patient returns today for  followup.  He has been stable except that he notes that since his  pacemaker was implanted, he has ringing in his ears.  He denies any loss  of hearing.  He denies any other symptoms, to speak of is well.   PHYSICAL EXAMINATION:  GENERAL:  The patient is a pleasant, middle-aged  man in no acute distress.  VITAL SIGNS:  The blood pressure today was 133/83, the pulse was 97 and  regular, respirations were 18.  NECK:  No jugular venous distention.  LUNGS:  Clear bilaterally to auscultation.  No wheezes, rales, or  rhonchi are present.  No increased work of breathing.  CARDIOVASCULAR:  Regular rate and rhythm.  Normal S1 and S2.  ABDOMEN:  Soft, nontender.  EXTREMITIES:  Demonstrate no edema.   MEDICATIONS:  1. Micardis 80 mg a day.  2. Aspirin 81 mg daily.   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  With regard to this patient's  heart block, the patient's pacemaker today is working normally.  His P  and R waves were 2 and 22 respectively.  The impedance 496 in the A and  749 in the V.  The threshold 0.5, 0.4 in the A and 0.625, 0.4 in the  right ventricle.  Battery voltage was 2.79 volts.  He was V pacing 100%  of the time.  With regard to this, I have recommended that he continue  followup, and we will see him back for pacemaker check in several  months.  With regard to the patient's smoking cessation,  I have  recommended that he continue to try to stop smoking and counseled him  that his nicotine patch would be a consideration when he  decides to  stop.     Doylene Canning. Ladona Ridgel, MD  Electronically Signed    GWT/MedQ  DD: 06/30/2008  DT: 07/01/2008  Job #: [id number]

## 2010-09-20 NOTE — Op Note (Signed)
Andrew Wiggins, Andrew Wiggins                 ACCOUNT NO.:  [id number]   MEDICAL RECORD NO.:  [id number]          PATIENT TYPE:  INP   LOCATION:  3313                         FACILITY:  [facility]   PHYSICIAN:  Doylene Canning. Ladona Ridgel, MD    DATE OF BIRTH:  04/18/1956   DATE OF PROCEDURE:  03/27/2008  DATE OF DISCHARGE:                               OPERATIVE REPORT   PROCEDURE PERFORMED:  Implantation of a dual-chamber pacemaker with  indication of symptomatic bradycardia.   INTRODUCTION:  The patient is a 55 year old male with hypertension who  was seen in Dr. 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  He ruled out for MI.  He has had no anginal  symptoms.  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  He is now referred for permanent pacemaker  implantation.  Of note, the patient has right bundle-branch block as  well.   PROCEDURE:  After informed consent was obtained, the patient was taken  to the diagnostic catheterization lab in the fasting state.  After usual  preparation and draping, intravenous fentanyl and midazolam was given  for sedation.  A 30 mL of lidocaine was infiltrated in the left  infraclavicular region.  A 5-cm incision was carried out over this  region.  Electrocautery was utilized to dissect down to the cephalic  vein.  The cephalic vein was isolated and the Medtronic model 5076 58-cm  active fixation pacing lead, serial number [id number] was advanced into  the right ventricle.  The Medtronic model 5076 52-cm active fixation  pacing lead, serial number [id number] was advanced into the right  atrium.  Mapping was carried out in the right ventricle at the final  site on the RV septal junction.  R-wave was measured 12.  The impedance  789 with the lead actively fixed, threshold 0.6 volts at 0.5  milliseconds and 10 volts pacing did not stimulate the diaphragm.  There  is a prominent injury current present.  With the  ventricular lead in  satisfactory position, attention was then turned for placement of the  atrial lead, which was placed in the anterolateral portion of the right  atrium where P-waves measured 5 millivolts, the impedance was 600 ohms,  threshold was 1.2 volts at 0.5 milliseconds.  A 10-volt pacing again did  not stimulate the diaphragm and the injury current was prominent.  With  both atrial and ventricular leads in satisfactory position, they were  secured to the subpectoralis fascia with a figure-of-eight silk suture.  The sewing sleeve was also secured with silk suture.  Electrocautery was  utilized to make subcutaneous pocket.  Gentamicin irrigation was  utilized to irrigate the pocket.  The Medtronic Adapta L dual-chamber  pacemaker, serial [id number] was connected to the atrial and ventricular  leads and placed back in the subcutaneous pocket.  Generator was secured  with silk suture.  Additional kanamycin was utilized to irrigate the  pocket and incision was closed with 2-0 Vicryl followed by a layer of 4-  0 Vicryl.  Benzoin  was painted on the skin, Steri-Strips were applied,  and a pressure dressing was placed, and the patient was returned to his  room in satisfactory condition.   COMPLICATIONS:  There were no immediate procedure complications.   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      Doylene Canning. Ladona Ridgel, MD  Electronically Signed     GWT/MEDQ  D:  03/27/2008  T:  03/28/2008  Job:  [id number]

## 2010-12-16 ENCOUNTER — Encounter: Payer: Self-pay | Admitting: *Deleted

## 2011-02-08 LAB — CBC
HCT: 45.5
HCT: 45.6
Hemoglobin: 15.4
MCHC: 33.8
MCV: 97.8
Platelets: 179
RBC: 4.65
WBC: 8.9
WBC: 9.4

## 2011-02-08 LAB — HEMOGLOBIN A1C
Hgb A1c MFr Bld: 6.5 — ABNORMAL HIGH
Mean Plasma Glucose: 140

## 2011-02-08 LAB — CARDIAC PANEL(CRET KIN+CKTOT+MB+TROPI)
CK, MB: 1.9
Relative Index: 1.3
Relative Index: 1.5
Troponin I: 0.01
Troponin I: 0.11 — ABNORMAL HIGH

## 2011-02-08 LAB — COMPREHENSIVE METABOLIC PANEL
ALT: 26
AST: 26
Alkaline Phosphatase: 70
CO2: 25
Calcium: 9.3
Chloride: 110
GFR calc Af Amer: >60
GFR calc non Af Amer: >60
Potassium: 4.4
Sodium: 141
Total Bilirubin: 0.7

## 2011-02-08 LAB — BASIC METABOLIC PANEL
BUN: 9
CO2: 27
Chloride: 110
Potassium: 4.4

## 2011-02-08 LAB — LIPID PANEL
LDL Cholesterol: 97
VLDL: 29

## 2011-02-08 LAB — B-NATRIURETIC PEPTIDE (CONVERTED LAB): Pro B Natriuretic peptide (BNP): <30

## 2011-07-27 ENCOUNTER — Ambulatory Visit: Payer: Self-pay | Admitting: Pulmonary Disease

## 2012-01-30 ENCOUNTER — Other Ambulatory Visit: Payer: Self-pay | Admitting: Pulmonary Disease

## 2012-01-31 ENCOUNTER — Telehealth: Payer: Self-pay | Admitting: Pulmonary Disease

## 2012-01-31 MED ORDER — LISINOPRIL 20 MG PO TABS: 20.0000 mg | ORAL_TABLET | Freq: Every day | ORAL | Status: DC

## 2012-01-31 NOTE — Telephone Encounter (Signed)
Pt aware that Rx has been sent for 1 month supply only and will keep OV with SN 03-06-12.

## 2012-03-05 ENCOUNTER — Encounter: Payer: Self-pay | Admitting: *Deleted

## 2012-03-06 ENCOUNTER — Other Ambulatory Visit: Payer: Self-pay | Admitting: Pulmonary Disease

## 2012-03-06 ENCOUNTER — Ambulatory Visit (INDEPENDENT_AMBULATORY_CARE_PROVIDER_SITE_OTHER): Payer: Medicare Other | Admitting: Pulmonary Disease

## 2012-03-06 ENCOUNTER — Other Ambulatory Visit (INDEPENDENT_AMBULATORY_CARE_PROVIDER_SITE_OTHER): Payer: Medicare Other

## 2012-03-06 ENCOUNTER — Encounter: Payer: Self-pay | Admitting: Pulmonary Disease

## 2012-03-06 ENCOUNTER — Ambulatory Visit (INDEPENDENT_AMBULATORY_CARE_PROVIDER_SITE_OTHER)
Admission: RE | Admit: 2012-03-06 | Discharge: 2012-03-06 | Disposition: A | Discharge status: Home / Self Care | Payer: Medicare Other | Source: Ambulatory Visit | Attending: Pulmonary Disease | Admitting: Pulmonary Disease

## 2012-03-06 VITALS — BP 164/88 | HR 90 | Temp 97.3°F | Ht 70.0 in | Wt 155.4 lb

## 2012-03-06 DIAGNOSIS — J449 Chronic obstructive pulmonary disease, unspecified: Secondary | ICD-10-CM

## 2012-03-06 DIAGNOSIS — I10 Essential (primary) hypertension: Secondary | ICD-10-CM | POA: Diagnosis not present

## 2012-03-06 DIAGNOSIS — F17200 Nicotine dependence, unspecified, uncomplicated: Secondary | ICD-10-CM

## 2012-03-06 DIAGNOSIS — E785 Hyperlipidemia, unspecified: Secondary | ICD-10-CM

## 2012-03-06 DIAGNOSIS — I459 Conduction disorder, unspecified: Secondary | ICD-10-CM

## 2012-03-06 DIAGNOSIS — N400 Enlarged prostate without lower urinary tract symptoms: Secondary | ICD-10-CM | POA: Diagnosis not present

## 2012-03-06 DIAGNOSIS — I779 Disorder of arteries and arterioles, unspecified: Secondary | ICD-10-CM | POA: Diagnosis not present

## 2012-03-06 DIAGNOSIS — I771 Stricture of artery: Secondary | ICD-10-CM

## 2012-03-06 DIAGNOSIS — F419 Anxiety disorder, unspecified: Secondary | ICD-10-CM

## 2012-03-06 DIAGNOSIS — J4489 Other specified chronic obstructive pulmonary disease: Secondary | ICD-10-CM

## 2012-03-06 DIAGNOSIS — Z950 Presence of cardiac pacemaker: Secondary | ICD-10-CM

## 2012-03-06 HISTORY — DX: Hyperlipidemia, unspecified: E78.5

## 2012-03-06 LAB — BASIC METABOLIC PANEL
BUN: 12 mg/dL (ref 6–23)
CO2: 28 mEq/L (ref 19–32)
Chloride: 104 mEq/L (ref 96–112)
Glucose, Bld: 104 mg/dL — ABNORMAL HIGH (ref 70–99)
Potassium: 4.8 mEq/L (ref 3.5–5.1)

## 2012-03-06 LAB — CBC WITH DIFFERENTIAL/PLATELET
Basophils Absolute: 0 10*3/uL (ref 0.0–0.1)
Eosinophils Absolute: 0.1 10*3/uL (ref 0.0–0.7)
Eosinophils Relative: 1 % (ref 0.0–5.0)
HCT: 48.7 % (ref 39.0–52.0)
Lymphs Abs: 3 10*3/uL (ref 0.7–4.0)
MCHC: 33.3 g/dL (ref 30.0–36.0)
MCV: 96.6 fl (ref 78.0–100.0)
Monocytes Absolute: 0.7 10*3/uL (ref 0.1–1.0)
Platelets: 250 10*3/uL (ref 150.0–400.0)
RDW: 13.6 % (ref 11.5–14.6)

## 2012-03-06 LAB — HEPATIC FUNCTION PANEL
ALT: 30 U/L (ref 0–53)
Total Bilirubin: 0.5 mg/dL (ref 0.3–1.2)

## 2012-03-06 LAB — LDL CHOLESTEROL, DIRECT: Direct LDL: 118.9 mg/dL

## 2012-03-06 LAB — LIPID PANEL: Cholesterol: 194 mg/dL (ref 0–200)

## 2012-03-06 LAB — PSA: PSA: 0.54 ng/mL (ref 0.10–4.00)

## 2012-03-06 NOTE — Patient Instructions (Addendum)
Today we updated your med list in our EPIC system...    Continue your current medications the same...    We refilled your LISINOPRIL BP med today...  Today we did your follow up CXR, EKG, & FASTING blood work...    We will call you w/ the results when available...  We will also schedule follow up blood flow test (Doppler studies) on your Carotids, & leg vessels to check your circulation...  In the meanwhile you NEED to Quit the smoking, and get on a good low cholesterol low fat diet...  Continue the 81mg  aspirin daily...  Let's plan a follow up visit in 6 months time, but call sooner if needed for problems.Marland KitchenMarland Kitchen

## 2012-03-10 ENCOUNTER — Encounter: Payer: Self-pay | Admitting: Pulmonary Disease

## 2012-03-10 NOTE — Progress Notes (Signed)
Subjective:    Patient ID: Andrew Wiggins, male    DOB: 01/02/1956, 56 y.o.   MRN: [id number]  HPI 56 y/o WM here for a follow up visit... 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  ~  July 27, 2010:  15 month ROV- he lost his job & doesn't have insurance therefore let his BP meds run out & hasn't been back to [facility] for pacer check either;  He called last month & we changed his Micardis to LISINOPRIL 20mg /d;  He's been checking his BP at home & prev 160-180/ 80-90 off meds has improved to the 140/80 range now;  We discussed the need to elim salt/ sodium & quit smoking (1ppd = very expensive) as well... He will monitor his BP at home & call me if >150/90 for additional med (?incr Lisin to 40, or add HCT 12.5?);  He promises to f/u w/ Cards as well...    Carotid dis>  Prev CDoppler w/ severe plaque on right ICA, he is taking ASA daily, he knows the contribution from smoking & the benefits of quitting, no cerebral ischemic symptoms & we discussed what to watch out for...    Health Maintenance>  He had Pneumovax 12/10;  Needs yearly Flu vaccines & so advised;  Needs baseline colonoscopy, DRE, & PSA...  ~  March 06, 2012:  23mo ROV & Koltyn is c/o leg pain w/ ambulation ~1block, then rests a few mins, then goes again; no lesions or sores on legs, DP pulse is diminished but PT pulse is intact, groin pulses ok w/o bruits heard... We discussed checking ArtDopplers of LE w/ ABI...    COPD> on Mucinex OTC but he won't stay on inhalers, etc; he denies recent URI or resp exac...    AbnCXR> w/ calcif granuloma right base; f/u CXR w/ pacer on left, normal heart size, clear lungs, NAD...    Cig Smoker> still smoking ~1/2ppd, offered smoking cessation help/ counseling/ but he's not  interested; I begged him to quit!    HBP> on Lisin20 but ran out several days ago, BP= 164/88, and med re-written w/ reminder to take every day...    Cardiac> ABV, Pacer> followed by DrTaylor; EKG shows NSR, rate85, LBBB;     Carotid Art dis> on ASA81; last CDoppler   w/ severe plaque on right but no signif ICAstenoses...    GERD> on Prilosec20 OTC as needed...    Headache> he uses Goodies as needed... We reviewed prob list, meds, xrays and labs> see below for updates >> OK Flu shot today... CXR 10/13 showed normal heart size, pacer on left, clear lungs, NAD.Marland KitchenMarland Kitchen EKG 10/13 showed NSR, rate85, LBBB, NAD... LABS 10/13:  FLP- not at goals on diet alone;  Chems- wnl x BS=104;  CBC- wnl;  PSA= 0.54 Arterial Dopplers of LEs==> pending         PROBLEM LIST:    CHRONIC OBSTRUCTIVE PULMONARY DISEASE (ICD-496) - hx recurrent bronchitic episodes in the past... smokes 1/2 - 1ppd x yrs... he notes mild cough, occas sputum, denies SOB... He will not stay on inhaled Rx... ~  PFT's 11/08 showed FVC= 3.85 (79%), FEV1= 2.19 (55%), %1sec= 57, mid-flows= 32% predicted:  all c/w mod airflow obstruction...   Hx of ABNORMAL CHEST XRAY (  ICD-793.1) - prev film w/ sm calcif granuloma right base, otherw neg...  ~  CXR 1/10 showed pacer placed, clear lungs, NAD.[name]. ~  CXR 12/10 showed Pacer on left, clear lungs & mild hyperinflation, NAD.[name]. ~  CXR 10/13 showed normal heart size, pacer on left, clear lungs, NAD...  CIGARETTE SMOKER (ICD-305.1) - 30+ yrs of >1ppd smoking... recently down to 1/2 ppd... we have discussed smoking cessation on numerous occas & reviewed options again today... He continues to decline smoking cessation help...  HYPERTENSION (ICD-401.9) - hx HBP on LISINOPRIL 20mg /d...  ~  2DEcho 11/09 showed sl incr LVwall thickness, norm LVF w/ EF= 55-60%, no regional wall motion abn, mild DD, mild MR... ~  3/12:  BP= 140/80 & denies fatigue, visual changes, CP, palipit, syncope, dyspnea, edema, etc... Changed  to Lisinopril20. ~  10/13:  BP= 164/88 but he ran out of Lisin20 3d ago; he denies CP, palpit, dizzy, SOB, edema...  CHEST WALL PAIN, HX OF (ICD-V15.89) ~  NuclearStressTest 12/09 = atypic Cp, LBBB, no perfusion defects, EF= 50% w/ abn septal motion.  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  CAROTID ARTERY DISEASE >> on ASA 81mg /d... exam 11/09 shows gr 1-2 sys murmur LSB, but also has prominent bruit to base of right side of his neck w/ right > left Carotid Bruit... ~  CDopplers 11/09 showed severe plaque right ICA, & min plaque on left... no signif ICA stenoses per report. ~  f/u CDopplers 12/10 were ordered but pt never had these done... ~  f/u CDopplers 11/13 ==> pending  R/O PERIPHERAL ARTERIAL DISEASE >> presented 10/13 w/ leg pain on ambulation... ~  11/13:  ArtDopplers & ABIs ==> pending  GERD (ICD-530.81) - prev mild GERD symptoms that improved after cholecystectomy on 2000... uses OTC PPI as needed... ~  he is due for routine screening colonoscopy...  HEADACHE (ICD-784.0) - long hx of intermittent HA's and had neuro eval by DrStiefel in the 80's thought to be mixed muscle contraction & migraines... ~  He uses Goodie's powders as needed...  CONTACT DERMATITIS DUE TO POISON IVY (ICD-692.6)  Heath Maintenance:  OK Flu shot & Pneumovax 12/10...   Past Medical History  Diagnosis Date  . COPD (chronic obstructive pulmonary disease)   . Abnormal chest x-ray   . Tobacco use disorder   . Hypertension   . Chest wall pain   . AV block     history of  . Cardiac pacemaker in situ   . GERD (gastroesophageal reflux disease)   . Headache   . Dermatitis, contact     due to poison ivy  . Peripheral vascular disease     Past Surgical History  Procedure Date  . Laproscopic cholecystectomy 2000    by Dr. Odie Sera  . Pacemaker insertion 03/2008    by Dr. Ladona Ridgel    Outpatient Encounter Prescriptions as of 03/06/2012    Medication Sig Dispense Refill  . aspirin 81 MG tablet Take 81 mg by mouth daily.        . Aspirin-Acetaminophen-Caffeine (GOODY HEADACHE PO) As needed for headaches       . Dextromethorphan-Guaifenesin (MUCINEX FAST-MAX DM MAX) 5-100 MG/5ML LIQD As directed on bottle---take as needed for congestion/cough       . [DISCONTINUED] lisinopril (PRINIVIL,ZESTRIL) 20 MG tablet Take 1 tablet (20 mg total) by mouth daily.  30 tablet  0    No Known Allergies   Current Medications, Allergies,  Past Medical History, Past Surgical History, Family History, and Social History were reviewed in [employer] record.   Review of Systems    Constitutional:  Denies F/C/S, anorexia, unexpected weight change. HEENT:  No HA, visual changes, earache, nasal symptoms, sore throat, hoarseness. Resp:  min cough & sputum; no hemoptysis; no SOB, tightness, wheezing. Cardio:  No CP, palpit, DOE, orthopnea, edema. GI:  Denies N/V/D/C or blood in stool; no reflux, abd pain, distention, or gas. GU:  No dysuria, freq, urgency, hematuria, or flank pain. MS:  Denies joint pain, swelling, tenderness, or decr ROM; no neck pain, back pain, etc. Neuro:  No tremors, seizures, dizziness, syncope, weakness, numbness, gait abn. Skin:  No suspicious lesions or skin rash. Heme:  No adenopathy, bruising, bleeding. Psyche: Denies confusion, sleep disturbance, hallucinations, anxiety, depression.    Objective:   Physical Exam   WD, WN, 56 y/o WM in NAD... Vital Signs:  Reviewed... BP recheck 142/ 80... General:  Alert & oriented; pleasant & cooperative... HEENT:  [place]/AT, EOM-wnl, PERRLA, Fundi-benign, EACs-clear, TMs-wnl, NOSE-clear, THROAT-clear & wnl. Neck:  Supple w/ full ROM; no JVD; normal carotid impulses & faint right neck bruit; no thyromegaly or nodules palpated; no lymphadenopathy. Chest:  Clear to P & A; without wheezes/ rales/ or rhonchi heard... Heart:  Regular Rhythm; norm S1 & S2 without  murmurs/ rubs/ or gallops detected... Abdomen:  Soft & nontender; normal bowel sounds; no organomegaly or masses palpated... Ext:  Normal ROM; without deformities or arthritic changes; no varicose veins, venous insuffic, or edema... Derm:  No lesions noted; no rash etc... Lymph:  No cervical, supraclavicular, axillary, or inguinal adenopathy palpated...    Assessment & Plan:    Pulm> COPD, AbnCXR, CigSmoker> CXR w/o acute changes; must quit all smoking but he is not inclined toward counseling etc...  HBP>  BP is up today but he ran out of his Lisin20 3d ago; re-written 7 reminded to take it every day!  Cardiac> LBBB, Pacer>  Followed by DrTaylor 7 due for f/u visit 7 pacer check...  Carotid Art Dis>  He is overdue for CDopplers==> pending; continue ASA daily...  PAD>  Presented 10/13 w/ LE pain on ambulation; Art dopplers ==> pending...  GERD> min symptoms and uses OTC Prilosec as needed...  Need for baseline Colonoscopy>  He hasn't yet done his baseline colonoscopy 7 reminded again...  Headaches>  Uses Goodies as needed...   Patient's Medications  New Prescriptions   No medications on file  Previous Medications   ASPIRIN 81 MG TABLET    Take 81 mg by mouth daily.     ASPIRIN-ACETAMINOPHEN-CAFFEINE (GOODY HEADACHE PO)    As needed for headaches    DEXTROMETHORPHAN-GUAIFENESIN (MUCINEX FAST-MAX DM MAX) 5-100 MG/5ML LIQD    As directed on bottle---take as needed for congestion/cough   Modified Medications   Modified Medication Previous Medication   LISINOPRIL (PRINIVIL,ZESTRIL) 20 MG TABLET lisinopril (PRINIVIL,ZESTRIL) 20 MG tablet      TAKE ONE TABLET BY MOUTH EVERY DAY    Take 1 tablet (20 mg total) by mouth daily.  Discontinued Medications   No medications on file

## 2012-03-12 ENCOUNTER — Telehealth: Payer: Self-pay | Admitting: Pulmonary Disease

## 2012-03-12 ENCOUNTER — Encounter (INDEPENDENT_AMBULATORY_CARE_PROVIDER_SITE_OTHER): Payer: Medicare Other

## 2012-03-12 DIAGNOSIS — Z950 Presence of cardiac pacemaker: Secondary | ICD-10-CM

## 2012-03-12 DIAGNOSIS — I70219 Atherosclerosis of native arteries of extremities with intermittent claudication, unspecified extremity: Secondary | ICD-10-CM | POA: Diagnosis not present

## 2012-03-12 DIAGNOSIS — I779 Disorder of arteries and arterioles, unspecified: Secondary | ICD-10-CM

## 2012-03-12 NOTE — Telephone Encounter (Signed)
Spoke with patient in regards to lab results and recs per SN. Pt expressed understanding.

## 2012-03-21 ENCOUNTER — Telehealth: Payer: Self-pay | Admitting: Pulmonary Disease

## 2012-03-21 NOTE — Telephone Encounter (Signed)
Notes Recorded by Marcellus Scott, CMA on 03/20/2012 at 1:06 PM [email or web address] for the pt ------  Notes Recorded by Michele Mcalpine, MD on 03/19/2012 at 7:48 AM Please notify patient>  Art dopplers of legs shows normal circulation, no blockages...   [id number]. Carron Curie, CMA

## 2012-03-21 NOTE — Telephone Encounter (Signed)
Spoke with pt and notified of results per Dr. Nadel. Pt verbalized understanding and denied any questions. 

## 2012-05-06 ENCOUNTER — Telehealth: Payer: Self-pay | Admitting: Pulmonary Disease

## 2012-05-06 MED ORDER — AMOXICILLIN-POT CLAVULANATE 875-125 MG PO TABS: 1.0000 | ORAL_TABLET | Freq: Two times a day (BID) | ORAL | Status: DC

## 2012-05-06 MED ORDER — HYDROCODONE-HOMATROPINE 5-1.5 MG/5ML PO SYRP: 5.0000 mL | ORAL_SOLUTION | ORAL | Status: DC | PRN

## 2012-05-06 NOTE — Telephone Encounter (Signed)
Per SN: ok for augmentin 875mg  #14 1 po BID, otc mucinex 600mg  2 po BID w/ plenty of fluids, hycodan #6oz 1 tsp po q4h prn cough.  Thanks.  Called spoke with patient, advised of SN's recs as stated above; pt already taking mucinex.  Pt verbalized his understanding.  Rx's telephoned to verified pharmacy.  Given verbally to pharmacist Bjorn Loser.  Nothing further needed at this time; will sign off.

## 2012-05-06 NOTE — Telephone Encounter (Signed)
Spoke with pt He is c/o congestion and cough x 3 days Started in head and has moved to chest Cough is prod with moderate white sputum Chest sore from cough Had low grade temp yesterday- tylenol helped No SOB, CP/Chest tightness, wheeze, or other co's Last ov 03/06/12 Next ov 09/04/12 No Known Allergies

## 2012-09-04 ENCOUNTER — Ambulatory Visit (INDEPENDENT_AMBULATORY_CARE_PROVIDER_SITE_OTHER): Payer: Medicare Other | Admitting: Pulmonary Disease

## 2012-09-04 ENCOUNTER — Encounter: Payer: Self-pay | Admitting: Pulmonary Disease

## 2012-09-04 VITALS — BP 130/90 | HR 96 | Temp 98.3°F | Ht 70.0 in | Wt 156.0 lb

## 2012-09-04 DIAGNOSIS — F17200 Nicotine dependence, unspecified, uncomplicated: Secondary | ICD-10-CM

## 2012-09-04 DIAGNOSIS — J309 Allergic rhinitis, unspecified: Secondary | ICD-10-CM

## 2012-09-04 DIAGNOSIS — J449 Chronic obstructive pulmonary disease, unspecified: Secondary | ICD-10-CM

## 2012-09-04 DIAGNOSIS — Z950 Presence of cardiac pacemaker: Secondary | ICD-10-CM

## 2012-09-04 DIAGNOSIS — K219 Gastro-esophageal reflux disease without esophagitis: Secondary | ICD-10-CM

## 2012-09-04 DIAGNOSIS — E785 Hyperlipidemia, unspecified: Secondary | ICD-10-CM

## 2012-09-04 DIAGNOSIS — I779 Disorder of arteries and arterioles, unspecified: Secondary | ICD-10-CM

## 2012-09-04 DIAGNOSIS — I10 Essential (primary) hypertension: Secondary | ICD-10-CM

## 2012-09-04 DIAGNOSIS — R51 Headache: Secondary | ICD-10-CM

## 2012-09-04 DIAGNOSIS — J4489 Other specified chronic obstructive pulmonary disease: Secondary | ICD-10-CM

## 2012-09-04 HISTORY — DX: Allergic rhinitis, unspecified: J30.9

## 2012-09-04 MED ORDER — LISINOPRIL 20 MG PO TABS: ORAL_TABLET | ORAL | Status: DC

## 2012-09-04 NOTE — Patient Instructions (Addendum)
Today we updated your med list in our EPIC system...    Continue your current medications the same...  For your allergy symptoms:    Try an OTC antihist like Claritin, Zyrtek, or Allegra once daily...    Use a NASAL Saline mist to spray in the nostrils every 1-2H as needed...     Call for any questions...  Let's plan a follow up visit in 18mo w/ FASTING blood work at that time.Marland KitchenMarland Kitchen

## 2012-09-04 NOTE — Progress Notes (Signed)
Subjective:    Patient ID: Andrew Wiggins, male    DOB: 1955-12-26, 57 y.o.   MRN: [id number]  HPI 57 y/o WM here for a follow up visit... 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  ~  July 27, 2010:  15 month ROV- he lost his job & doesn't have insurance therefore let his BP meds run out & hasn't been back to [facility] for pacer check either;  He called last month & we changed his Micardis to LISINOPRIL 20mg /d;  He's been checking his BP at home & prev 160-180/ 80-90 off meds has improved to the 140/80 range now;  We discussed the need to elim salt/ sodium & quit smoking (1ppd = very expensive) as well... He will monitor his BP at home & call me if >150/90 for additional med (?incr Lisin to 40, or add HCT 12.5?);  He promises to f/u w/ Cards as well...    Carotid dis>  Prev CDoppler w/ severe plaque on right ICA, he is taking ASA daily, he knows the contribution from smoking & the benefits of quitting, no cerebral ischemic symptoms & we discussed what to watch out for...    Health Maintenance>  He had Pneumovax 12/10;  Needs yearly Flu vaccines & so advised;  Needs baseline colonoscopy, DRE, & PSA...  ~  March 06, 2012:  13mo ROV & [name] is c/o leg pain w/ ambulation ~1block, then rests a few mins, then goes again; no lesions or sores on legs, DP pulse is diminished but PT pulse is intact, groin pulses ok w/o bruits heard... We discussed checking ArtDopplers of LE w/ ABI...    COPD> on Mucinex OTC but he won't stay on inhalers, etc; he denies recent URI or resp exac...    AbnCXR> w/ calcif granuloma right base; f/u CXR w/ pacer on left, normal heart size, clear lungs, NAD...    Cig Smoker> still smoking ~1/2ppd, offered smoking cessation help/ counseling/ but he's not  interested; I begged him to quit!    HBP> on Lisin20 but ran out several days ago, BP= 164/88, and med re-written w/ reminder to take every day...    Cardiac> AVB, Pacer> followed by DrTaylor; EKG shows NSR, rate85, LBBB;     Carotid Art dis> on ASA81; last CDoppler   w/ severe plaque on right but no signif ICAstenoses...    GERD> on Prilosec20 OTC as needed...    Headache> he uses Goodies as needed... We reviewed prob list, meds, xrays and labs> see below for updates >> OK Flu shot today... CXR 10/13 showed normal heart size, pacer on left, clear lungs, NAD.Marland KitchenMarland Kitchen EKG 10/13 showed NSR, rate85, LBBB, NAD... LABS 10/13:  FLP- not at goals on diet alone;  Chems- wnl x BS=104;  CBC- wnl;  PSA= 0.54 Carotid Dopplers were ordered but never done... Arterial Dopplers of LEs 11/13 were WNL- normal waveforms, no blockages...  ~  September 04, 2012:  69mo ROV & [name] has had a good interval- just c/o some allergy symptoms w/ the spring pollen; we discussed Rx w/ OTC antihist, nasal saline, etc;  He has been creating walking sticks, birdhouses, etc- he is a talented Nutritional therapist;  We reviewed the following medical problems during today's office visit >>  COPD> on Mucinex OTC but he won't stay on inhalers, etc; still smoking but "trying to quit"; he denies recent URI or resp exac; he declines smoking cessation help...    AbnCXR> w/ calcif granuloma right base; f/u CXR 10/13 w/ pacer on left, normal heart size, clear lungs, NAD...    Cig Smoker> still smoking ~1/2ppd, offered smoking cessation help/ counseling/ but he's not interested; I begged him to quit!    HBP> on Lisin20; not on approp salt restriction; BP= 130/90, & he denies CP/ palpit/ SOB/ edema/ etc...    Cardiac> AVB, Pacer> followed by DrTaylor; EKG 10/13 shows NSR, rate85, LBBB...    Carotid Art dis> on ASA81; last CDoppler w/ severe plaque on right but no signif ICAstenoses=> needs f/u but he cancelled...    GERD> on Prilosec20 OTC as needed...     Headache> he uses Goodies as needed... We reviewed prob list, meds, xrays and labs> see below for updates >>           PROBLEM LIST:    CHRONIC OBSTRUCTIVE PULMONARY DISEASE (ICD-496) - hx recurrent bronchitic episodes in the past... smokes 1/2 - 1ppd x yrs... he notes mild cough, occas sputum, denies SOB... He will not stay on inhaled Rx... ~  PFT's 11/08 showed FVC= 3.85 (79%), FEV1= 2.19 (55%), %1sec= 57, mid-flows= 32% predicted:  all c/w mod airflow obstruction...   Hx of ABNORMAL CHEST XRAY (ICD-793.1) - prev film w/ sm calcif granuloma right base, otherw neg...  ~  CXR 1/10 showed pacer placed, clear lungs, NAD.[name]. ~  CXR 12/10 showed Pacer on left, clear lungs & mild hyperinflation, NAD.[name]. ~  CXR 10/13 showed normal heart size, pacer on left, clear lungs, NAD...  CIGARETTE SMOKER (ICD-305.1) - 30+ yrs of >1ppd smoking... Now down to 1/2 ppd... we have discussed smoking cessation on numerous occas & reviewed options again today... He continues to decline smoking cessation help...  HYPERTENSION (ICD-401.9) - hx HBP on LISINOPRIL 20mg /d...  ~  2DEcho 11/09 showed sl incr LVwall thickness, norm LVF w/ EF= 55-60%, no regional wall motion abn, mild DD, mild MR... ~  3/12:  BP= 140/80 & denies fatigue, visual changes, CP, palipit, syncope, dyspnea, edema, etc... Changed to Lisinopril20. ~  10/13:  BP= 164/88 but he ran out of Lisin20 3d ago; he denies CP, palpit, dizzy, SOB, edema... ~  4/14:  on Lisin20; not on approp salt restriction; BP= 130/90, & he denies CP/ palpit/ SOB/ edema/ etc.  CHEST WALL PAIN, HX OF (ICD-V15.89) ~  NuclearStressTest 12/09 = atypic Cp, LBBB, no perfusion defects, EF= 50% w/ abn septal motion.  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  ~  EKG 10/13 showed NSR, rate85, LBBB, NAD.  CAROTID ARTERY DISEASE >> on ASA 81mg /d... exam 11/09 shows gr 1-2 sys murmur LSB, but also has prominent bruit to base of  right side of his neck w/ right > left Carotid Bruit... ~  CDopplers 11/09 showed severe plaque right ICA, & min plaque on left... no signif ICA stenoses per report. ~  f/u CDopplers 12/10 were ordered but pt never had these done... ~  f/u CDopplers 11/13 ==> pending (pt cancelled)  R/O PERIPHERAL ARTERIAL DISEASE >> presented 10/13 w/ leg pain on ambulation... ~  11/13:  ArtDopplers & ABIs ==> normal  GERD (ICD-530.81) - prev mild GERD symptoms that improved after cholecystectomy on 2000... uses OTC PPI as needed... ~  he is due  for routine screening colonoscopy but he refuses to schedule this important screening procedure...  HEADACHE (ICD-784.0) - long hx of intermittent HA's and had neuro eval by DrStiefel in the 80's thought to be mixed muscle contraction & migraines... ~  He uses Goodie's powders as needed...  CONTACT DERMATITIS DUE TO POISON IVY (ICD-692.6)  Heath Maintenance:  OK Flu shot & Pneumovax 12/10...   Past Medical History  Diagnosis Date  . COPD (chronic obstructive pulmonary disease)   . Abnormal chest x-ray   . Tobacco use disorder   . Hypertension   . Chest wall pain   . AV block     history of  . Cardiac pacemaker in situ   . GERD (gastroesophageal reflux disease)   . Headache   . Dermatitis, contact     due to poison ivy  . Peripheral vascular disease     Past Surgical History  Procedure Laterality Date  . Laproscopic cholecystectomy  2000    by Dr. Odie Sera  . Pacemaker insertion  03/2008    by Dr. Ladona Ridgel    Outpatient Encounter Prescriptions as of 09/04/2012  Medication Sig Dispense Refill  . aspirin 81 MG tablet Take 81 mg by mouth daily.        . Aspirin-Acetaminophen-Caffeine (GOODY HEADACHE PO) As needed for headaches       . lisinopril (PRINIVIL,ZESTRIL) 20 MG tablet TAKE ONE TABLET BY MOUTH EVERY DAY  90 tablet  3  . [DISCONTINUED] amoxicillin-clavulanate (AUGMENTIN) 875-125 MG per tablet Take 1 tablet by mouth 2 (two) times daily.  14  tablet  0  . [DISCONTINUED] Dextromethorphan-Guaifenesin (MUCINEX FAST-MAX DM MAX) 5-100 MG/5ML LIQD As directed on bottle---take as needed for congestion/cough       . [DISCONTINUED] HYDROcodone-homatropine (HYCODAN) 5-1.5 MG/5ML syrup Take 5 mLs by mouth every 4 (four) hours as needed for cough.  180 mL  0   No facility-administered encounter medications on file as of 09/04/2012.    No Known Allergies   Current Medications, Allergies, Past Medical History, Past Surgical History, Family History, and Social History were reviewed in [employer] [facility] record.   Review of Systems    Constitutional:  Denies F/C/S, anorexia, unexpected weight change. HEENT:  No HA, visual changes, earache, nasal symptoms, sore throat, hoarseness. Resp:  min cough & sputum; no hemoptysis; no SOB, tightness, wheezing. Cardio:  No CP, palpit, DOE, orthopnea, edema. GI:  Denies N/V/D/C or blood in stool; no reflux, abd pain, distention, or gas. GU:  No dysuria, freq, urgency, hematuria, or flank pain. MS:  Denies joint pain, swelling, tenderness, or decr ROM; no neck pain, back pain, etc. Neuro:  No tremors, seizures, dizziness, syncope, weakness, numbness, gait abn. Skin:  No suspicious lesions or skin rash. Heme:  No adenopathy, bruising, bleeding. Psyche: Denies confusion, sleep disturbance, hallucinations, anxiety, depression.    Objective:   Physical Exam   WD, WN, 57 y/o WM in NAD... Vital Signs:  Reviewed... BP recheck 142/ 80... General:  Alert & oriented; pleasant & cooperative... HEENT:  [place]/AT, EOM-wnl, PERRLA, Fundi-benign, EACs-clear, TMs-wnl, NOSE-clear, THROAT-clear & wnl. Neck:  Supple w/ full ROM; no JVD; normal carotid impulses & faint right neck bruit; no thyromegaly or nodules palpated; no lymphadenopathy. Chest:  Clear to P & A; without wheezes/ rales/ or rhonchi heard... Heart:  Regular Rhythm; norm S1 & S2 without murmurs/ rubs/ or gallops detected... Abdomen:  Soft  & nontender; normal bowel sounds; no organomegaly or masses palpated... Ext:  Normal ROM; without  deformities or arthritic changes; no varicose veins, venous insuffic, or edema... Derm:  No lesions noted; no rash etc... Lymph:  No cervical, supraclavicular, axillary, or inguinal adenopathy palpated...    Assessment & Plan:    Pulm> COPD, AbnCXR, CigSmoker> CXR w/o acute changes; must quit all smoking but he is not inclined toward counseling etc...  HBP>  BP is borderline today on Lisin20- reminded to take it every day!  Cardiac> LBBB, Pacer>  Followed by DrTaylor & due for f/u visit & pacer check...  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  PAD>  Presented 10/13 w/ LE pain on ambulation; Art dopplers ==> wnl...  GERD> min symptoms and uses OTC Prilosec as needed...  Need for baseline Colonoscopy>  He hasn't yet done his baseline colonoscopy & reminded again...  Headaches>  Uses Goodies as needed...   Patient's Medications  New Prescriptions   No medications on file  Previous Medications   ASPIRIN 81 MG TABLET    Take 81 mg by mouth daily.     ASPIRIN-ACETAMINOPHEN-CAFFEINE (GOODY HEADACHE PO)    As needed for headaches   Modified Medications   Modified Medication Previous Medication   LISINOPRIL (PRINIVIL,ZESTRIL) 20 MG TABLET lisinopril (PRINIVIL,ZESTRIL) 20 MG tablet      TAKE ONE TABLET BY MOUTH EVERY DAY    TAKE ONE TABLET BY MOUTH EVERY DAY  Discontinued Medications   AMOXICILLIN-CLAVULANATE (AUGMENTIN) 875-125 MG PER TABLET    Take 1 tablet by mouth 2 (two) times daily.   DEXTROMETHORPHAN-GUAIFENESIN (MUCINEX FAST-MAX DM MAX) 5-100 MG/5ML LIQD    As directed on bottle---take as needed for congestion/cough    HYDROCODONE-HOMATROPINE (HYCODAN) 5-1.5 MG/5ML SYRUP    Take 5 mLs by mouth every 4 (four) hours as needed for cough.

## 2013-10-06 ENCOUNTER — Telehealth: Payer: Self-pay | Admitting: Pulmonary Disease

## 2013-10-06 MED ORDER — LISINOPRIL 20 MG PO TABS: ORAL_TABLET | ORAL | Status: DC

## 2013-10-06 NOTE — Telephone Encounter (Signed)
Attempted to call pt but no VM has been set up.  Will try back later.  Pt will need to either schedule an appt with SN or he will need to be set up with new primary care doctor for refills.  Will try back later.

## 2013-10-06 NOTE — Telephone Encounter (Signed)
Pt called back and has appt scheduled with SN on 6-19 at 10.  Pt is aware that refill has been sent to the pharmacy for 30 day supply.  Nothing further is needed.

## 2013-10-24 ENCOUNTER — Ambulatory Visit (INDEPENDENT_AMBULATORY_CARE_PROVIDER_SITE_OTHER): Payer: Medicare Other | Admitting: Pulmonary Disease

## 2013-10-24 ENCOUNTER — Encounter: Payer: Self-pay | Admitting: Pulmonary Disease

## 2013-10-24 ENCOUNTER — Other Ambulatory Visit (INDEPENDENT_AMBULATORY_CARE_PROVIDER_SITE_OTHER): Payer: Medicare Other

## 2013-10-24 ENCOUNTER — Ambulatory Visit (INDEPENDENT_AMBULATORY_CARE_PROVIDER_SITE_OTHER)
Admission: RE | Admit: 2013-10-24 | Discharge: 2013-10-24 | Disposition: A | Discharge status: Home / Self Care | Payer: Medicare Other | Source: Ambulatory Visit | Attending: Pulmonary Disease | Admitting: Pulmonary Disease

## 2013-10-24 VITALS — BP 138/78 | HR 75 | Temp 97.5°F | Ht 70.0 in | Wt 152.6 lb

## 2013-10-24 DIAGNOSIS — I459 Conduction disorder, unspecified: Secondary | ICD-10-CM

## 2013-10-24 DIAGNOSIS — I739 Peripheral vascular disease, unspecified: Secondary | ICD-10-CM

## 2013-10-24 DIAGNOSIS — Z950 Presence of cardiac pacemaker: Secondary | ICD-10-CM | POA: Diagnosis not present

## 2013-10-24 DIAGNOSIS — R51 Headache: Secondary | ICD-10-CM

## 2013-10-24 DIAGNOSIS — R61 Generalized hyperhidrosis: Secondary | ICD-10-CM | POA: Diagnosis not present

## 2013-10-24 DIAGNOSIS — J449 Chronic obstructive pulmonary disease, unspecified: Secondary | ICD-10-CM | POA: Diagnosis not present

## 2013-10-24 DIAGNOSIS — K219 Gastro-esophageal reflux disease without esophagitis: Secondary | ICD-10-CM

## 2013-10-24 DIAGNOSIS — F17200 Nicotine dependence, unspecified, uncomplicated: Secondary | ICD-10-CM | POA: Diagnosis not present

## 2013-10-24 DIAGNOSIS — E785 Hyperlipidemia, unspecified: Secondary | ICD-10-CM | POA: Diagnosis not present

## 2013-10-24 DIAGNOSIS — I779 Disorder of arteries and arterioles, unspecified: Secondary | ICD-10-CM

## 2013-10-24 DIAGNOSIS — N320 Bladder-neck obstruction: Secondary | ICD-10-CM | POA: Diagnosis not present

## 2013-10-24 LAB — SEDIMENTATION RATE: Sed Rate: 9 mm/hr (ref 0–22)

## 2013-10-24 LAB — LIPID PANEL
CHOL/HDL RATIO: 6
CHOLESTEROL: 173 mg/dL (ref 0–200)
HDL: 29.1 mg/dL — ABNORMAL LOW (ref >39.00)
LDL CALC: 109 mg/dL — AB (ref 0–99)
NonHDL: 143.9
TRIGLYCERIDES: 173 mg/dL — AB (ref 0.0–149.0)
VLDL: 34.6 mg/dL (ref 0.0–40.0)

## 2013-10-24 LAB — CBC WITH DIFFERENTIAL/PLATELET
BASOS PCT: 0.1 % (ref 0.0–3.0)
Basophils Absolute: 0 10*3/uL (ref 0.0–0.1)
EOS PCT: 1.5 % (ref 0.0–5.0)
Eosinophils Absolute: 0.1 10*3/uL (ref 0.0–0.7)
HEMATOCRIT: 46 % (ref 39.0–52.0)
HEMOGLOBIN: 15.7 g/dL (ref 13.0–17.0)
LYMPHS ABS: 2.4 10*3/uL (ref 0.7–4.0)
Lymphocytes Relative: 24.6 % (ref 12.0–46.0)
MCHC: 34.1 g/dL (ref 30.0–36.0)
MCV: 94.9 fl (ref 78.0–100.0)
MONO ABS: 0.7 10*3/uL (ref 0.1–1.0)
MONOS PCT: 6.8 % (ref 3.0–12.0)
NEUTROS ABS: 6.6 10*3/uL (ref 1.4–7.7)
Neutrophils Relative %: 67 % (ref 43.0–77.0)
Platelets: 216 10*3/uL (ref 150.0–400.0)
RBC: 4.84 Mil/uL (ref 4.22–5.81)
RDW: 13.6 % (ref 11.5–15.5)
WBC: 9.8 10*3/uL (ref 4.0–10.5)

## 2013-10-24 LAB — BASIC METABOLIC PANEL
BUN: 11 mg/dL (ref 6–23)
CALCIUM: 9.5 mg/dL (ref 8.4–10.5)
CO2: 29 mEq/L (ref 19–32)
Chloride: 106 mEq/L (ref 96–112)
Creatinine, Ser: 0.8 mg/dL (ref 0.4–1.5)
GFR: 105.22 mL/min (ref >60.00)
GLUCOSE: 133 mg/dL — AB (ref 70–99)
POTASSIUM: 4.7 meq/L (ref 3.5–5.1)
SODIUM: 139 meq/L (ref 135–145)

## 2013-10-24 LAB — HEPATIC FUNCTION PANEL
ALBUMIN: 4.1 g/dL (ref 3.5–5.2)
ALT: 20 U/L (ref 0–53)
AST: 16 U/L (ref 0–37)
Alkaline Phosphatase: 77 U/L (ref 39–117)
BILIRUBIN TOTAL: 0.4 mg/dL (ref 0.2–1.2)
Bilirubin, Direct: 0.1 mg/dL (ref 0.0–0.3)
Total Protein: 6.8 g/dL (ref 6.0–8.3)

## 2013-10-24 LAB — PSA: PSA: 0.55 ng/mL (ref 0.10–4.00)

## 2013-10-24 LAB — TSH: TSH: 0.67 u[IU]/mL (ref 0.35–4.50)

## 2013-10-24 MED ORDER — LISINOPRIL 20 MG PO TABS: ORAL_TABLET | ORAL | Status: DC

## 2013-10-24 NOTE — Patient Instructions (Signed)
Today we updated your med list in our EPIC system...    Continue your current medications the same...  Today we did your follow up CXR & FASTING blood work...    We will contact you w/ the results when available...   For the night sweats>>    Try the OTC herbal prep called "black cohosh" to see if this helps...    If our labs & CXR are unrevealing, then the next step is to consider an Endocrine consult...  Call for any questions...  Let's plan a follow up visit in [id number], sooner if needed for problems.[name]..[name]

## 2013-10-24 NOTE — Progress Notes (Signed)
Subjective:    Patient ID: Andrew Wiggins, male    DOB: 1955/11/03, 58 y.o.   MRN: [id number]  HPI 58 y/o WM here for a follow up visit... 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  ~  July 27, 2010:  15 month ROV- he lost his job & doesn't have insurance therefore let his BP meds run out & hasn't been back to [facility] for pacer check either;  He called last month & we changed his Micardis to LISINOPRIL 20mg /d;  He's been checking his BP at home & prev 160-180/ 80-90 off meds has improved to the 140/80 range now;  We discussed the need to elim salt/ sodium & quit smoking (1ppd = very expensive) as well... He will monitor his BP at home & call me if >150/90 for additional med (?incr Lisin to 40, or add HCT 12.5?);  He promises to f/u w/ Cards as well...    Carotid dis>  Prev CDoppler w/ severe plaque on right ICA, he is taking ASA daily, he knows the contribution from smoking & the benefits of quitting, no cerebral ischemic symptoms & we discussed what to watch out for...    Health Maintenance>  He had Pneumovax 12/10;  Needs yearly Flu vaccines & so advised;  Needs baseline colonoscopy, DRE, & PSA...  ~  March 06, 2012:  51mo ROV & Andrew Wiggins is c/o leg pain w/ ambulation ~1block, then rests a few mins, then goes again; no lesions or sores on legs, DP pulse is diminished but PT pulse is intact, groin pulses ok w/o bruits heard... We discussed checking ArtDopplers of LE w/ ABI...    COPD> on Mucinex OTC but he won't stay on inhalers, etc; he denies recent URI or resp exac...    AbnCXR> w/ calcif granuloma right base; f/u CXR w/ pacer on left, normal heart size, clear lungs, NAD...    Cig Smoker> still smoking ~1/2ppd, offered smoking cessation help/ counseling/ but he's not  interested; I begged him to quit!    HBP> on Lisin20 but ran out several days ago, BP= 164/88, and med re-written w/ reminder to take every day...    Cardiac> AVB, Pacer> followed by DrTaylor; EKG shows NSR, rate85, LBBB;     Carotid Art dis> on ASA81; last CDoppler   w/ severe plaque on right but no signif ICAstenoses...    GERD> on Prilosec20 OTC as needed...    Headache> he uses Goodies as needed... We reviewed prob list, meds, xrays and labs> see below for updates >> OK Flu shot today...  CXR 10/13 showed normal heart size, pacer on left, clear lungs, NAD.Marland KitchenMarland Kitchen  EKG 10/13 showed NSR, rate85, LBBB, NAD...  LABS 10/13:  FLP- not at goals on diet alone;  Chems- wnl x BS=104;  CBC- wnl;  PSA= 0.54  Carotid Dopplers were ordered but never done...  Arterial Dopplers of LEs 11/13 were WNL- normal waveforms, no blockages...  ~  September 04, 2012:  27mo ROV & Andrew Wiggins has had a good interval- just c/o some allergy symptoms w/ the spring pollen; we discussed Rx w/ OTC antihist, nasal saline, etc;  He has been creating walking sticks, birdhouses, etc- he is a talented Nutritional therapist;  We reviewed the following medical problems during today's office  visit >>     COPD> on Mucinex OTC but he won't stay on inhalers, etc; still smoking but "trying to quit"; he denies recent URI or resp exac; he declines smoking cessation help...    AbnCXR> w/ calcif granuloma right base; f/u CXR 10/13 w/ pacer on left, normal heart size, clear lungs, NAD...    Cig Smoker> still smoking ~1/2ppd, offered smoking cessation help/ counseling/ but he's not interested; I begged him to quit!    HBP> on Lisin20; not on approp salt restriction; BP= 130/90, & he denies CP/ palpit/ SOB/ edema/ etc...    Cardiac> AVB, Pacer> followed by DrTaylor; EKG 10/13 shows NSR, rate85, LBBB...    Carotid Art dis> on ASA81; last CDoppler w/ severe plaque on right but no signif ICAstenoses=> needs f/u but he cancelled...    GERD> on Prilosec20 OTC as  needed...    Headache> he uses Goodies as needed... We reviewed prob list, meds, xrays and labs> see below for updates >>   ~  October 24, 2013:  42mo ROV & Andrew Wiggins is c/o some night sweats and "hot flashes" which he thinks is "the change"; still smoking 1/2 ppd but denies cardioresp symptoms; we discussed trial of "black cohosh" & noted he may need endocrine eval if routine studies are neg & symptom persists... We reviewed the following medical problems during today's office visit >>     COPD> on Mucinex OTC but he won't stay on inhalers, etc; still smoking 1/2ppd but "trying to quit"; he denies recent URI or resp exac; he declines smoking cessation help...    AbnCXR> w/ calcif granuloma right base; f/u CXR 6/15 w/ pacer on left, normal heart size, clear lungs, NAD...    Cig Smoker> still smoking ~1/2ppd, offered smoking cessation help/ counseling/ but he's not interested; I begged him to quit (again)!    HBP> on Lisin20; not on approp salt restriction (reminded); BP= 138/78, & he denies CP/ palpit/ SOB/ edema/ etc...    Cardiac> AVB, Pacer> followed by DrTaylor; EKG 10/13 shows NSR, rate85, LBBB...    Carotid Art dis> on ASA81; last CDoppler 2009 w/ severe plaque on right but no signif ICAstenoses=> needs f/u but he cancelled Dopplers in 2010 & 2013...    Hyperlipid> on diet alone;  FLP 6/15 showed TChol 173, TG 173, HDL 29, LDL 109... rec better low fat diet & incr exercise!    GERD> on Prilosec20 OTC as needed...    Headache> he uses Goodies as needed... We reviewed prob list, meds, xrays and labs> see below for updates >> he notes that OTC B12 tabs are giving him some energy...   CXR 6/15 showed normal heart size, clear lungs, NAD, pacer ok...  LABS 6/15:  FLP- ok x TG/LDL not at goals;  Chems- ok x BS=133;  CBC- wnl;  TSH=0.67;  PSA=0.55;  Sed=9...           PROBLEM LIST:    CHRONIC OBSTRUCTIVE PULMONARY DISEASE (ICD-496) - hx recurrent bronchitic episodes in the past... smokes 1/2 - 1ppd x  yrs... he notes mild cough, occas sputum, denies SOB... He will not stay on inhaled Rx... ~  PFT's 11/08 showed FVC= 3.85 (79%), FEV1= 2.19 (55%), %1sec= 57, mid-flows= 32% predicted:  all c/w mod airflow obstruction...   Hx of ABNORMAL CHEST XRAY (ICD-793.1) - prev film w/ sm calcif granuloma right base, otherw neg...  ~  CXR 1/10 showed pacer placed, clear lungs, NAD.[name]. ~  CXR 12/10 showed Pacer on left,  clear lungs & mild hyperinflation, NAD.[name]. ~  CXR 10/13 showed normal heart size, pacer on left, clear lungs, NAD.[name]. ~  CXR 6/15 showed normal heart size, clear lungs, NAD, pacer ok...  CIGARETTE SMOKER (ICD-305.1) - 30+ yrs of >1ppd smoking... Now down to 1/2 ppd... we have discussed smoking cessation on numerous occas & reviewed options again today... He continues to decline smoking cessation help...  HYPERTENSION (ICD-401.9) - hx HBP on LISINOPRIL 20mg /d...  ~  2DEcho 11/09 showed sl incr LVwall thickness, norm LVF w/ EF= 55-60%, no regional wall motion abn, mild DD, mild MR... ~  3/12:  BP= 140/80 & denies fatigue, visual changes, CP, palipit, syncope, dyspnea, edema, etc... Changed to Lisinopril20. ~  10/13:  BP= 164/88 but he ran out of Lisin20 3d ago; he denies CP, palpit, dizzy, SOB, edema... ~  4/14:  on Lisin20; not on approp salt restriction; BP= 130/90, & he denies CP/ palpit/ SOB/ edema/ etc. ~  6/15:  on Lisin20; not on approp salt restriction (reminded); BP= 138/78, & he denies CP/ palpit/ SOB/ edema/ etc...  CHEST WALL PAIN, HX OF (ICD-V15.89) ~  NuclearStressTest 12/09 = atypic Cp, LBBB, no perfusion defects, EF= 50% w/ abn septal motion.  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  ~  EKG 10/13 showed NSR, rate85, LBBB, NAD.  CAROTID ARTERY DISEASE >> on ASA 81mg /d... exam 11/09 shows gr 1-2 sys murmur LSB, but also has prominent bruit to base of right side of his neck w/ right > left Carotid Bruit... ~  CDopplers  11/09 showed severe plaque right ICA, & min plaque on left... no signif ICA stenoses per report. ~  f/u CDopplers 12/10 were ordered but pt never had these done... ~  f/u CDopplers 11/13 ==> pending (pt cancelled) ~  Pt has declined f/u CDopplers...  R/O PERIPHERAL ARTERIAL DISEASE >> presented 10/13 w/ leg pain on ambulation... ~  11/13:  ArtDopplers & ABIs ==> normal  BORDERLINE HYPERLIPIDEMIA >>  ~  FLP 10/13 on diet alone showed TChol 194, TG 272, HDL 30, LDL 119... We reviewed low fat diet needed...  ~  FLP 6/15 on diet alone showed TChol 173, TG 173, HDL 29, LDL 109... Needs better low fat diet & incr exercise...  GERD (ICD-530.81) - prev mild GERD symptoms that improved after cholecystectomy on 2000... uses OTC PPI as needed... ~  he is due for routine screening colonoscopy but he refuses to schedule this important screening procedure...  HEADACHE (ICD-784.0) - long hx of intermittent HA's and had neuro eval by DrStiefel in the 80's thought to be mixed muscle contraction & migraines... ~  He uses Goodie's powders as needed...  CONTACT DERMATITIS DUE TO POISON IVY (ICD-692.6)  Heath Maintenance:  OK Flu shot & Pneumovax 12/10...   Past Medical History  Diagnosis Date  . COPD (chronic obstructive pulmonary disease)   . Abnormal chest x-ray   . Tobacco use disorder   . Hypertension   . Chest wall pain   . AV block     history of  . Cardiac pacemaker in situ   . GERD (gastroesophageal reflux disease)   . Headache(784.0)   . Dermatitis, contact     due to poison ivy  . Peripheral vascular disease     Past Surgical History  Procedure Laterality Date  . Laproscopic cholecystectomy  2000    by Dr. Odie Sera  . Pacemaker insertion  03/2008  by Dr. Ladona Ridgelaylor    Outpatient Encounter Prescriptions as of 10/24/2013  Medication Sig  . aspirin 81 MG tablet Take 81 mg by mouth daily.    . Aspirin-Acetaminophen-Caffeine (GOODY HEADACHE PO) As needed for headaches   .  lisinopril (PRINIVIL,ZESTRIL) 20 MG tablet TAKE ONE TABLET BY MOUTH EVERY DAY  . vitamin B-12 (CYANOCOBALAMIN) 1000 MCG tablet Take 1,000 mcg by mouth daily.    No Known Allergies   Current Medications, Allergies, Past Medical History, Past Surgical History, Family History, and Social History were reviewed in [facility] electronic medical record.   Review of Systems    Constitutional:  Denies F/C/S, anorexia, unexpected weight change. He is c/o night sweats. HEENT:  No HA, visual changes, earache, nasal symptoms, sore throat, hoarseness. Resp:  min cough & sputum; no hemoptysis; no SOB, tightness, wheezing. Cardio:  No CP, palpit, DOE, orthopnea, edema. GI:  Denies N/V/D/C or blood in stool; no reflux, abd pain, distention, or gas. GU:  No dysuria, freq, urgency, hematuria, or flank pain. MS:  Denies joint pain, swelling, tenderness, or decr ROM; no neck pain, back pain, etc. Neuro:  No tremors, seizures, dizziness, syncope, weakness, numbness, gait abn. Skin:  No suspicious lesions or skin rash. Heme:  No adenopathy, bruising, bleeding. Psyche: Denies confusion, sleep disturbance, hallucinations, anxiety, depression.    Objective:   Physical Exam   WD, WN, 58 y/o WM in NAD... Vital Signs:  Reviewed... BP recheck 142/ 80... General:  Alert & oriented; pleasant & cooperative... HEENT:  [place]/AT, EOM-wnl, PERRLA, Fundi-benign, EACs-clear, TMs-wnl, NOSE-clear, THROAT-clear & wnl. Neck:  Supple w/ full ROM; no JVD; normal carotid impulses & faint right neck bruit; no thyromegaly or nodules palpated; no lymphadenopathy. Chest:  Clear to P & A; without wheezes/ rales/ or rhonchi heard... Heart:  Regular Rhythm; norm S1 & S2 without murmurs/ rubs/ or gallops detected... Abdomen:  Soft & nontender; normal bowel sounds; no organomegaly or masses palpated... Ext:  Normal ROM; without deformities or arthritic changes; no varicose veins, venous insuffic, or edema... Derm:  No lesions  noted; no rash etc... Scar on bridge of nose from prev skin ca... Lymph:  No cervical, supraclavicular, axillary, or inguinal adenopathy palpated...    Assessment & Plan:    Pulm> COPD, AbnCXR, CigSmoker> CXR w/o acute changes; must quit all smoking but he is not inclined toward counseling etc...  HBP>  BP is controlled today on Lisin20- reminded to take it every day!  Cardiac> LBBB, Pacer>  Followed by DrTaylor & due for f/u visit & pacer check...  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  HYPERLIPID>  TG is improved but still not at goal & HDL is low; needs better low fat diet & incr exercise...  PAD>  Presented 10/13 w/ LE pain on ambulation; Art dopplers ==> wnl...  GERD> min symptoms and uses OTC Prilosec as needed...  Need for baseline Colonoscopy>  He hasn't yet done his baseline colonoscopy & reminded again...  Headaches>  Uses Goodies as needed...   Patient's Medications  New Prescriptions   No medications on file  Previous Medications   ASPIRIN 81 MG TABLET    Take 81 mg by mouth daily.     ASPIRIN-ACETAMINOPHEN-CAFFEINE (GOODY HEADACHE PO)    As needed for headaches    VITAMIN B-12 (CYANOCOBALAMIN) 1000 MCG TABLET    Take 1,000 mcg by mouth daily.  Modified Medications   Modified Medication  Previous Medication   LISINOPRIL (PRINIVIL,ZESTRIL) 20 MG TABLET lisinopril (PRINIVIL,ZESTRIL) 20 MG tablet      TAKE ONE TABLET BY MOUTH EVERY DAY    TAKE ONE TABLET BY MOUTH EVERY DAY  Discontinued Medications   No medications on file

## 2014-03-04 ENCOUNTER — Telehealth: Payer: Self-pay | Admitting: Pulmonary Disease

## 2014-03-04 MED ORDER — AZITHROMYCIN 250 MG PO TABS: ORAL_TABLET | ORAL | Status: DC

## 2014-03-04 NOTE — Telephone Encounter (Signed)
Per SN---  Ok for the zpak #1  Take as directed Call for appt if not seeing improvement  Called and spoke with pt and he is aware of SN recs.  Nothing further is needed.

## 2014-03-04 NOTE — Telephone Encounter (Signed)
Called, spoke with pt.  C/o increased cough with increased mucus production (clear to white), wheezing, PND, and feels a "tickle" in chest x 1 wk.  Denies chest tightness/pain, increased SOB, or f/c/s.  Taking mucinex "about q4h" with no relief.  States zpak has worked well in the past for same sxs -- requesting rx.  Dr. Kriste BasqueNadel, pls advise.  Thank you.  Last OV with SN: 10/24/13 No pending appt  [facility] in [place]  No Known Allergies

## 2014-03-04 NOTE — Telephone Encounter (Signed)
[phone] returning call

## 2014-03-04 NOTE — Telephone Encounter (Signed)
LMTCB x 1 

## 2014-12-04 ENCOUNTER — Ambulatory Visit (INDEPENDENT_AMBULATORY_CARE_PROVIDER_SITE_OTHER)
Admission: RE | Admit: 2014-12-04 | Discharge: 2014-12-04 | Disposition: A | Discharge status: Home / Self Care | Payer: Medicare Other | Source: Ambulatory Visit | Attending: Pulmonary Disease | Admitting: Pulmonary Disease

## 2014-12-04 ENCOUNTER — Encounter: Payer: Self-pay | Admitting: Pulmonary Disease

## 2014-12-04 ENCOUNTER — Ambulatory Visit (INDEPENDENT_AMBULATORY_CARE_PROVIDER_SITE_OTHER): Payer: Medicare Other | Admitting: Pulmonary Disease

## 2014-12-04 VITALS — BP 146/80 | HR 97 | Temp 98.9°F | Ht 70.0 in | Wt 152.4 lb

## 2014-12-04 DIAGNOSIS — I459 Conduction disorder, unspecified: Secondary | ICD-10-CM | POA: Diagnosis not present

## 2014-12-04 DIAGNOSIS — E785 Hyperlipidemia, unspecified: Secondary | ICD-10-CM

## 2014-12-04 DIAGNOSIS — J449 Chronic obstructive pulmonary disease, unspecified: Secondary | ICD-10-CM

## 2014-12-04 DIAGNOSIS — Z950 Presence of cardiac pacemaker: Secondary | ICD-10-CM

## 2014-12-04 DIAGNOSIS — I779 Disorder of arteries and arterioles, unspecified: Secondary | ICD-10-CM

## 2014-12-04 DIAGNOSIS — I10 Essential (primary) hypertension: Secondary | ICD-10-CM | POA: Diagnosis not present

## 2014-12-04 DIAGNOSIS — I739 Peripheral vascular disease, unspecified: Secondary | ICD-10-CM

## 2014-12-04 DIAGNOSIS — F17200 Nicotine dependence, unspecified, uncomplicated: Secondary | ICD-10-CM

## 2014-12-04 DIAGNOSIS — M25512 Pain in left shoulder: Secondary | ICD-10-CM

## 2014-12-04 DIAGNOSIS — Z720 Tobacco use: Secondary | ICD-10-CM

## 2014-12-04 MED ORDER — BUDESONIDE-FORMOTEROL FUMARATE 160-4.5 MCG/ACT IN AERO: 2.0000 | INHALATION_SPRAY | Freq: Two times a day (BID) | RESPIRATORY_TRACT | Status: DC

## 2014-12-04 NOTE — Progress Notes (Addendum)
Subjective:    Patient ID: Andrew Wiggins, male    DOB: July 16, 1955, 59 y.o.   MRN: [id number]  HPI 59 y/o WM here for a follow up visit... 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  ~  July 27, 2010:  15 month ROV- he lost his job & doesn't have insurance therefore let his BP meds run out & hasn't been back to [facility] for pacer check either;  He called last month & we changed his Micardis to LISINOPRIL 20mg /d;  He's been checking his BP at home & prev 160-180/ 80-90 off meds has improved to the 140/80 range now;  We discussed the need to elim salt/ sodium & quit smoking (1ppd = very expensive) as well... He will monitor his BP at home & call me if >150/90 for additional med (?incr Lisin to 40, or add HCT 12.5?);  He promises to f/u w/ Cards as well...    Carotid dis>  Prev CDoppler w/ severe plaque on right ICA, he is taking ASA daily, he knows the contribution from smoking & the benefits of quitting, no cerebral ischemic symptoms & we discussed what to watch out for...    Health Maintenance>  He had Pneumovax 12/10;  Needs yearly Flu vaccines & so advised;  Needs baseline colonoscopy, DRE, & PSA...  ~  March 06, 2012:  88mo ROV & Andrew Wiggins is c/o leg pain w/ ambulation ~1block, then rests a few mins, then goes again; no lesions or sores on legs, DP pulse is diminished but PT pulse is intact, groin pulses ok w/o bruits heard... We discussed checking ArtDopplers of LE w/ ABI...    COPD> on Mucinex OTC but he won't stay on inhalers, etc; he denies recent URI or resp exac...    AbnCXR> w/ calcif granuloma right base; f/u CXR w/ pacer on left, normal heart size, clear lungs, NAD...    Cig Smoker> still smoking ~1/2ppd, offered smoking cessation help/ counseling/ but he's not  interested; I begged him to quit!    HBP> on Lisin20 but ran out several days ago, BP= 164/88, and med re-written w/ reminder to take every day...    Cardiac> AVB, Pacer> followed by DrTaylor; EKG shows NSR, rate85, LBBB;     Carotid Art dis> on ASA81; last CDoppler   w/ severe plaque on right but no signif ICAstenoses...    GERD> on Prilosec20 OTC as needed...    Headache> he uses Goodies as needed... We reviewed prob list, meds, xrays and labs> see below for updates >> OK Flu shot today...  CXR 10/13 showed normal heart size, pacer on left, clear lungs, NAD.Marland KitchenMarland Kitchen  EKG 10/13 showed NSR, rate85, LBBB, NAD...  LABS 10/13:  FLP- not at goals on diet alone;  Chems- wnl x BS=104;  CBC- wnl;  PSA= 0.54  Carotid Dopplers were ordered but never done...  Arterial Dopplers of LEs 11/13 were WNL- normal waveforms, no blockages...  ~  September 04, 2012:  77mo ROV & Andrew Wiggins has had a good interval- just c/o some allergy symptoms w/ the spring pollen; we discussed Rx w/ OTC antihist, nasal saline, etc;  He has been creating walking sticks, birdhouses, etc- he is a talented Nutritional therapist;  We reviewed the following medical problems during today's office  visit >>     COPD> on Mucinex OTC but he won't stay on inhalers, etc; still smoking but "trying to quit"; he denies recent URI or resp exac; he declines smoking cessation help...    AbnCXR> w/ calcif granuloma right base; f/u CXR 10/13 w/ pacer on left, normal heart size, clear lungs, NAD...    Cig Smoker> still smoking ~1/2ppd, offered smoking cessation help/ counseling/ but he's not interested; I begged him to quit!    HBP> on Lisin20; not on approp salt restriction; BP= 130/90, & he denies CP/ palpit/ SOB/ edema/ etc...    Cardiac> AVB, Pacer> followed by DrTaylor; EKG 10/13 shows NSR, rate85, LBBB...    Carotid Art dis> on ASA81; last CDoppler w/ severe plaque on right but no signif ICAstenoses=> needs f/u but he cancelled...    GERD> on Prilosec20 OTC as  needed...    Headache> he uses Goodies as needed... We reviewed prob list, meds, xrays and labs> see below for updates >>   ~  October 24, 2013:  37mo ROV & Andrew Wiggins is c/o some night sweats and "hot flashes" which he thinks is "the change"; still smoking 1/2 ppd but denies cardioresp symptoms; we discussed trial of "black cohosh" & noted he may need endocrine eval if routine studies are neg & symptom persists... We reviewed the following medical problems during today's office visit >>     COPD> on Mucinex OTC but he won't stay on inhalers, etc; still smoking 1/2ppd but "trying to quit"; he denies recent URI or resp exac; he declines smoking cessation help...    AbnCXR> w/ calcif granuloma right base; f/u CXR 6/15 w/ pacer on left, normal heart size, clear lungs, NAD...    Cig Smoker> still smoking ~1/2ppd, offered smoking cessation help/ counseling/ but he's not interested; I begged him to quit (again)!    HBP> on Lisin20; not on approp salt restriction (reminded); BP= 138/78, & he denies CP/ palpit/ SOB/ edema/ etc...    Cardiac> AVB, Pacer> followed by DrTaylor; EKG 10/13 shows NSR, rate85, LBBB...    Carotid Art dis> on ASA81; last CDoppler 2009 w/ severe plaque on right but no signif ICAstenoses=> needs f/u but he cancelled Dopplers in 2010 & 2013...    Hyperlipid> on diet alone;  FLP 6/15 showed TChol 173, TG 173, HDL 29, LDL 109... rec better low fat diet & incr exercise!    GERD> on Prilosec20 OTC as needed...    Headache> he uses Goodies as needed... We reviewed prob list, meds, xrays and labs> see below for updates >> he notes that OTC B12 tabs are giving him some energy...   CXR 6/15 showed normal heart size, clear lungs, NAD, pacer ok...  LABS 6/15:  FLP- ok x TG/LDL not at goals;  Chems- ok x BS=133;  CBC- wnl;  TSH=0.67;  PSA=0.55;  Sed=9...   ~  December 04, 2014:  35mo ROV & Andrew Wiggins returns for yearly ROV, states breathing is OK, not on meds, still smoking ~1/2ppd; he denies resp exac over  the past yr- notes AM cough, min sput, no discoloration or hemoptysis; same SOB/DOE w/ exertion eg-walking but he notes legs bother him more than breathing... His CC is left shoulder pain, denies trauma, OTC analgesics help & we have rec trial Tramadol & refer to Ortho for XRays and further eval... We reviewed the following medical problems during today's office visit >>     COPD> on Mucinex OTC prn but he won't stay on inhalers,  etc; still smoking 1/2ppd; he denies recent URI or resp exac; he declines smoking cessation help...    AbnCXR> w/ calcif granuloma right base; f/u CXR 7/16 w/ pacer on left, normal heart size, clear lungs, NAD...    Cig Smoker> still smoking ~1/2ppd, offered smoking cessation help/ counseling/ but he's not interested; I begged him to quit (again)!    HBP> on Lisin20; not on approp salt restriction (reminded); BP= 146/80, & he denies CP/ palpit/ SOB/ edema/ etc...    Cardiac> AVB, Pacer> followed by DrTaylor but last seen ?2011; EKG 10/13 shows NSR, rate85, LBBB; we will arrange for Cards f/u appt..    Carotid Art dis> on ASA81; last CDoppler 2009 w/ severe plaque on right but no signif ICAstenoses=> needs f/u but he cancelled Dopplers in 2010 & 2013...    Hyperlipid> on diet alone;  FLP 6/15 showed TChol 173, TG 173, HDL 29, LDL 109... rec better low fat diet & incr exercise!    GERD> on Prilosec20 OTC as needed...    Headache> he uses Goodies as needed... EXAM showed Afeb, VSS, O2sat=96% on RA;  Heent/neck- 1+right CBruit;  Chest- decr BS at bases, otherw clear;  Heart- RR, gr1/6 SEM w/o r/g;  Abd- sogt, neg;  Ext- neg w/o c/c/e... We reviewed prob list, meds, xrays and labs> see below for updates >>   CXR showed COPD changes, pacer ok, NAD...   Spirometry 12/04/14 was attempted (see graph) but predicted values not recorded on the test...  IMP/PLAN>>  Shan has refused to quit smoking and won't take inhalers; he has cancelled several attempts at f/u CDopplers; he has not  followed up w/ Cards in 5 yrs or had pacer checked; we will arrange for f/u appt w/ Cards => DrTaylor 12/10/14 w/ Carotids;  We will arrange for Ortho referral for left shoulder;  ROV w/ me in 57mo sooner if needed prn...   ADDENDUM>>  CDoppler 12/10/14 showed mixed plaque bilat, incr in right carotid stenosis, same on left;  REC- contin ASA81 daily, QUIT ALL SMOKING, low chol/ low fat diet... Repeat CDoppler 23yr...          PROBLEM LIST:    CHRONIC OBSTRUCTIVE PULMONARY DISEASE (ICD-496) - hx recurrent bronchitic episodes in the past... smokes 1/2 - 1ppd x yrs... he notes mild cough, occas sputum, denies SOB... He will not stay on inhaled Rx... ~  PFT's 11/08 showed FVC= 3.85 (79%), FEV1= 2.19 (55%), %1sec= 57, mid-flows= 32% predicted:  all c/w mod airflow obstruction...   Hx of ABNORMAL CHEST XRAY (ICD-793.1) - prev film w/ sm calcif granuloma right base, otherw neg...  ~  CXR 1/10 showed pacer placed, clear lungs, NAD.[name]. ~  CXR 12/10 showed Pacer on left, clear lungs & mild hyperinflation, NAD.[name]. ~  CXR 10/13 showed normal heart size, pacer on left, clear lungs, NAD.[name]. ~  CXR 6/15 showed normal heart size, clear lungs, NAD, pacer ok... ~  CXR 7/16 showed COPD changes, pacer ok, NAD...  CIGARETTE SMOKER (ICD-305.1) - 30+ yrs of >1ppd smoking... Now down to 1/2 ppd... we have discussed smoking cessation on numerous occas & reviewed options again today... He continues to decline smoking cessation help...  HYPERTENSION (ICD-401.9) - hx HBP on LISINOPRIL 20mg /d...  ~  2DEcho 11/09 showed sl incr LVwall thickness, norm LVF w/ EF= 55-60%, no regional wall motion abn, mild DD, mild MR... ~  3/12:  BP= 140/80 & denies fatigue, visual changes, CP, palipit, syncope, dyspnea, edema, etc... Changed to Lisinopril20. ~  10/13:  BP= 164/88 but he ran out of Lisin20 3d ago; he denies CP, palpit, dizzy, SOB, edema... ~  4/14:  on Lisin20; not on approp salt restriction; BP= 130/90, & he denies CP/ palpit/ SOB/  edema/ etc. ~  6/15:  on Lisin20; not on approp salt restriction (reminded); BP= 138/78, & he denies CP/ palpit/ SOB/ edema/ etc... ~  7/16: on Lisin20; BP=146/80 & he remains asymptomatic, reminded to take med every day & elim salt...  CHEST WALL PAIN, HX OF (ICD-V15.89) ~  NuclearStressTest 12/09 = atypic Cp, LBBB, no perfusion defects, EF= 50% w/ abn septal motion.  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  ~  EKG 10/13 showed NSR, rate85, LBBB, NAD.  CAROTID ARTERY DISEASE >> on ASA 81mg /d... exam 11/09 shows gr 1-2 sys murmur LSB, but also has prominent bruit to base of right side of his neck w/ right > left Carotid Bruit... ~  CDopplers 11/09 showed severe plaque right ICA, & min plaque on left... no signif ICA stenoses per report. ~  f/u CDopplers 12/10 were ordered but pt never had these done... ~  f/u CDopplers 11/13 ==> pending (pt cancelled) ~  Pt has declined f/u CDopplers... ~  7/16: we arranged to f/u CDopplers and ROV w/ DrTaylor 12/10/14...  R/O PERIPHERAL ARTERIAL DISEASE >> presented 10/13 w/ leg pain on ambulation... ~  11/13:  ArtDopplers & ABIs ==> normal  BORDERLINE HYPERLIPIDEMIA >>  ~  FLP 10/13 on diet alone showed TChol 194, TG 272, HDL 30, LDL 119... We reviewed low fat diet needed...  ~  FLP 6/15 on diet alone showed TChol 173, TG 173, HDL 29, LDL 109... Needs better low fat diet & incr exercise...  GERD (ICD-530.81) - prev mild GERD symptoms that improved after cholecystectomy on 2000... uses OTC PPI as needed... ~  he is due for routine screening colonoscopy but he refuses to schedule this important screening procedure...  HEADACHE (ICD-784.0) - long hx of intermittent HA's and had neuro eval by DrStiefel in the 80's thought to be mixed muscle contraction & migraines... ~  He uses Goodie's powders as needed...  CONTACT DERMATITIS DUE TO POISON IVY (ICD-692.6)  Heath Maintenance:  OK Flu shot &  Pneumovax 12/10...   Past Medical History  Diagnosis Date  . COPD (chronic obstructive pulmonary disease)   . Abnormal chest x-ray   . Tobacco use disorder   . Hypertension   . Chest wall pain   . AV block     history of  . Cardiac pacemaker in situ   . GERD (gastroesophageal reflux disease)   . Headache(784.0)   . Dermatitis, contact     due to poison ivy  . Peripheral vascular disease     Past Surgical History  Procedure Laterality Date  . Laproscopic cholecystectomy  2000    by Dr. Odie Sera  . Pacemaker insertion  03/2008    by Dr. Ladona Ridgel    Outpatient Encounter Prescriptions as of 12/04/2014  Medication Sig  . aspirin 81 MG tablet Take 81 mg by mouth daily.    . Aspirin-Acetaminophen-Caffeine (GOODY HEADACHE PO) As needed for headaches   . lisinopril (PRINIVIL,ZESTRIL) 20 MG tablet TAKE ONE TABLET BY MOUTH EVERY DAY  . vitamin B-12 (CYANOCOBALAMIN) 1000 MCG tablet Take 1,000 mcg by mouth daily.  . [DISCONTINUED] azithromycin (ZITHROMAX) 250 MG tablet Take as directed (Patient not taking: Reported on 12/04/2014)   No facility-administered encounter  medications on file as of 12/04/2014.    No Known Allergies   Current Medications, Allergies, Past Medical History, Past Surgical History, Family History, and Social History were reviewed in [employer] record.   Review of Systems    Constitutional:  Denies F/C/S, anorexia, unexpected weight change. He is c/o night sweats. HEENT:  No HA, visual changes, earache, nasal symptoms, sore throat, hoarseness. Resp:  min cough & sputum; no hemoptysis; no SOB, tightness, wheezing. Cardio:  No CP, palpit, DOE, orthopnea, edema. GI:  Denies N/V/D/C or blood in stool; no reflux, abd pain, distention, or gas. GU:  No dysuria, freq, urgency, hematuria, or flank pain. MS:  Denies joint pain, swelling, tenderness, or decr ROM; no neck pain, back pain, etc. Neuro:  No tremors, seizures, dizziness, syncope,  weakness, numbness, gait abn. Skin:  No suspicious lesions or skin rash. Heme:  No adenopathy, bruising, bleeding. Psyche: Denies confusion, sleep disturbance, hallucinations, anxiety, depression.    Objective:   Physical Exam   WD, WN, 59 y/o WM in NAD... Vital Signs:  Reviewed... BP recheck 142/ 80... General:  Alert & oriented; pleasant & cooperative... HEENT:  [place]/AT, EOM-wnl, PERRLA, Fundi-benign, EACs-clear, TMs-wnl, NOSE-clear, THROAT-clear & wnl. Neck:  Supple w/ full ROM; no JVD; normal carotid impulses & faint right neck bruit; no thyromegaly or nodules palpated; no lymphadenopathy. Chest:  Clear to P & A; without wheezes/ rales/ or rhonchi heard... Heart:  Regular Rhythm; norm S1 & S2 without murmurs/ rubs/ or gallops detected... Abdomen:  Soft & nontender; normal bowel sounds; no organomegaly or masses palpated... Ext:  Normal ROM; without deformities or arthritic changes; no varicose veins, venous insuffic, or edema... Derm:  No lesions noted; no rash etc... Scar on bridge of nose from prev skin ca... Lymph:  No cervical, supraclavicular, axillary, or inguinal adenopathy palpated...    Assessment & Plan:    Pulm> COPD, AbnCXR, CigSmoker> CXR w/o acute changes; must quit all smoking but he is not inclined toward counseling etc...  HBP>  BP is controlled today on Lisin20- reminded to take it every day!  Cardiac> LBBB, Pacer>  Followed by DrTaylor & due for f/u visit & pacer check...  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  HYPERLIPID>  TG is improved but still not at goal & HDL is low; needs better low fat diet & incr exercise...  PAD>  Presented 10/13 w/ LE pain on ambulation; Art dopplers ==> wnl...  GERD> min symptoms and uses OTC Prilosec as needed...  Need for baseline Colonoscopy>  He hasn't yet done his baseline colonoscopy & reminded again...  Headaches>  Uses Goodies as  needed...   Patient's Medications  New Prescriptions   BUDESONIDE-FORMOTEROL (SYMBICORT) 160-4.5 MCG/ACT INHALER    Inhale 2 puffs into the lungs 2 (two) times daily.  Previous Medications   ASPIRIN 81 MG TABLET    Take 81 mg by mouth daily.     ASPIRIN-ACETAMINOPHEN-CAFFEINE (GOODY HEADACHE PO)    As needed for headaches    LISINOPRIL (PRINIVIL,ZESTRIL) 20 MG TABLET    TAKE ONE TABLET BY MOUTH EVERY DAY   VITAMIN B-12 (CYANOCOBALAMIN) 1000 MCG TABLET    Take 1,000 mcg by mouth daily.  Modified Medications   No medications on file  Discontinued Medications   AZITHROMYCIN (ZITHROMAX) 250 MG TABLET    Take as directed

## 2014-12-04 NOTE — Patient Instructions (Signed)
Today we updated your med list in our EPIC system...    Continue your current medications the same...  Since you are still smoking- we checked your Pulmonary function & a CXR today...    You need to quit ALL the smoking, & should start on an inhaler=> SYMBICORT160- 2 sprays twice daily for your breathing...    We will contact you w/ the CXR results when available...   You are overdue for a recheck on your pacemaker by drTaylor et al & we will set this up at your convenience...  You are also overdue for a follow up Carotid doppler test to be sure the plaque has not advanced and caused a blockage that would put you at resk for a stroke...    We will set this test up at your convenience...  Finally we will make a referral to an Orthopedist due to your left shoulder predicament...  Call for any questions...  Let's plan a follow up visit in 48mo, sooner if needed for problems.Marland KitchenMarland Kitchen

## 2014-12-05 ENCOUNTER — Other Ambulatory Visit: Payer: Self-pay | Admitting: Pulmonary Disease

## 2014-12-07 ENCOUNTER — Telehealth: Payer: Self-pay | Admitting: Pulmonary Disease

## 2014-12-07 NOTE — Telephone Encounter (Signed)
Spoke with pt. He is needing a refill on Lisinopril. According to our records, this has already been sent in. Pt is aware of this. Nothing further was needed.

## 2014-12-10 ENCOUNTER — Ambulatory Visit (HOSPITAL_COMMUNITY)
Admission: RE | Admit: 2014-12-10 | Discharge: 2014-12-10 | Disposition: A | Discharge status: Home / Self Care | Payer: Medicare Other | Source: Ambulatory Visit | Attending: Pulmonary Disease | Admitting: Pulmonary Disease

## 2014-12-10 ENCOUNTER — Ambulatory Visit (INDEPENDENT_AMBULATORY_CARE_PROVIDER_SITE_OTHER): Payer: Medicare Other | Admitting: Internal Medicine

## 2014-12-10 ENCOUNTER — Encounter: Payer: Self-pay | Admitting: Internal Medicine

## 2014-12-10 VITALS — BP 152/74 | HR 88 | Ht 70.0 in | Wt 153.4 lb

## 2014-12-10 DIAGNOSIS — I459 Conduction disorder, unspecified: Secondary | ICD-10-CM | POA: Diagnosis not present

## 2014-12-10 DIAGNOSIS — Z950 Presence of cardiac pacemaker: Secondary | ICD-10-CM

## 2014-12-10 DIAGNOSIS — I779 Disorder of arteries and arterioles, unspecified: Secondary | ICD-10-CM | POA: Diagnosis not present

## 2014-12-10 DIAGNOSIS — I10 Essential (primary) hypertension: Secondary | ICD-10-CM

## 2014-12-10 DIAGNOSIS — J449 Chronic obstructive pulmonary disease, unspecified: Secondary | ICD-10-CM

## 2014-12-10 DIAGNOSIS — I739 Peripheral vascular disease, unspecified: Secondary | ICD-10-CM

## 2014-12-10 DIAGNOSIS — I6523 Occlusion and stenosis of bilateral carotid arteries: Secondary | ICD-10-CM | POA: Insufficient documentation

## 2014-12-10 LAB — CUP PACEART INCLINIC DEVICE CHECK
Battery Voltage: 2.8 V
Brady Statistic AP VS Percent: 0 %
Brady Statistic AS VP Percent: 94 %
Brady Statistic AS VS Percent: 0 %
Date Time Interrogation Session: 20160804123745
Lead Channel Impedance Value: 469 Ohm
Lead Channel Pacing Threshold Amplitude: 0.75 V
Lead Channel Pacing Threshold Pulse Width: 0.4 ms
Lead Channel Setting Pacing Amplitude: 1.5 V
Lead Channel Setting Pacing Pulse Width: 0.4 ms
Lead Channel Setting Sensing Sensitivity: 5.6 mV
MDC IDC MSMT BATTERY IMPEDANCE: 439 Ohm
MDC IDC MSMT BATTERY REMAINING LONGEVITY: 94 mo
MDC IDC MSMT LEADCHNL RA SENSING INTR AMPL: 4 mV
MDC IDC MSMT LEADCHNL RV IMPEDANCE VALUE: 538 Ohm
MDC IDC MSMT LEADCHNL RV PACING THRESHOLD AMPLITUDE: 0.75 V
MDC IDC MSMT LEADCHNL RV PACING THRESHOLD PULSEWIDTH: 0.4 ms
MDC IDC SET LEADCHNL RV PACING AMPLITUDE: 2 V
MDC IDC STAT BRADY AP VP PERCENT: 6 %

## 2014-12-10 NOTE — Assessment & Plan Note (Signed)
He is encouraged to stop smoking. He will continue his bronchodilators and followup with Dr. Kriste Basque.

## 2014-12-10 NOTE — Assessment & Plan Note (Signed)
His Medtronic DDD PM is working normally. Will recheck in several months. 

## 2014-12-10 NOTE — Assessment & Plan Note (Signed)
He has systolic HTN today. He will continue his current meds.

## 2014-12-10 NOTE — Progress Notes (Signed)
      HPI [name] returns today after a long absence from our EP clinic. He is a pleasant 59 yo man with compete heart block, s/p PPM insertion, HTN, and COPD. He is seen by Dr. Kriste Basque. The patient denies being hospitalized, and overall feels well. He denies chest pain or sob.  No Known Allergies   Current Outpatient Prescriptions  Medication Sig Dispense Refill  . aspirin 81 MG tablet Take 81 mg by mouth daily.      . Aspirin-Acetaminophen-Caffeine (GOODY HEADACHE PO) Take 1 packet by mouth daily as needed for headaches    . budesonide-formoterol (SYMBICORT) 160-4.5 MCG/ACT inhaler Inhale 2 puffs into the lungs 2 (two) times daily. 1 Inhaler 6  . lisinopril (PRINIVIL,ZESTRIL) 20 MG tablet TAKE ONE TABLET BY MOUTH ONCE DAILY 90 tablet 0  . vitamin B-12 (CYANOCOBALAMIN) 1000 MCG tablet Take 1,000 mcg by mouth daily.     No current facility-administered medications for this visit.     Past Medical History  Diagnosis Date  . COPD (chronic obstructive pulmonary disease)   . Abnormal chest x-ray   . Tobacco use disorder   . Hypertension   . Chest wall pain   . AV block     history of  . Cardiac pacemaker in situ   . GERD (gastroesophageal reflux disease)   . Headache(784.0)   . Dermatitis, contact     due to poison ivy  . Peripheral vascular disease     ROS:   All systems reviewed and negative except as noted in the HPI.   Past Surgical History  Procedure Laterality Date  . Laproscopic cholecystectomy  2000    by Dr. Odie Sera  . Pacemaker insertion  03/2008    by Dr. Ladona Ridgel     No family history on file.   History   Social History  . Marital Status: Legally Separated    Spouse Name: N/A  . Number of Children: 2  . Years of Education: N/A   Occupational History  . unemployed     works for himself   Social History Main Topics  . Smoking status: Current Every Day Smoker -- 0.50 packs/day for 25 years    Types: Cigarettes  . Smokeless tobacco: Never Used      Comment: 5 cigs per day  . Alcohol Use: No  . Drug Use: No  . Sexual Activity: Not on file   Other Topics Concern  . Not on file   Social History Narrative   2 siblings died with leukemia around age 71     BP 152/74 mmHg  Pulse 88  Ht  (1.778 m)  Wt 153 lb 6.4 oz (69.582 kg)  BMI 22.01 kg/m2  Physical Exam:  Well appearing but diskempt 59 yo man, NAD HEENT: Unremarkable Neck:  6 cm JVD, no thyromegally Back:  No CVA tenderness Lungs:  Clear with no wheezes HEART:  Regular rate rhythm, no murmurs, no rubs, no clicks Abd:  soft, positive bowel sounds, no organomegally, no rebound, no guarding Ext:  2 plus pulses, no edema, no cyanosis, no clubbing Skin:  No rashes no nodules Neuro:  CN II through XII intact, motor grossly intact   DEVICE  Normal device function.  See PaceArt for details.   Assess/Plan:

## 2014-12-10 NOTE — Patient Instructions (Signed)
Medication Instructions:  Your physician recommends that you continue on your current medications as directed. Please refer to the Current Medication list given to you today.   Labwork: None ordered  Testing/Procedures: None ordered  Follow-Up: Your physician wants you to follow-up in: 12 months with Dr. Taylor.  You will receive a reminder letter in the mail two months in advance. If you don't receive a letter, please call our office to schedule the follow-up appointment.  Remote monitoring is used to monitor your Pacemaker from home. This monitoring reduces the number of office visits required to check your device to one time per year. It allows us to keep an eye on the functioning of your device to ensure it is working properly. You are scheduled for a device check from home on 03-11-15. You may send your transmission at any time that day. If you have a wireless device, the transmission will be sent automatically. After your physician reviews your transmission, you will receive a postcard with your next transmission date.    Any Other Special Instructions Will Be Listed Below (If Applicable).   

## 2014-12-21 ENCOUNTER — Telehealth: Payer: Self-pay | Admitting: Pulmonary Disease

## 2014-12-21 NOTE — Telephone Encounter (Signed)
Result Note     Please notify patient>     CDoppler shows mixed plaque bilat w/ worsening right stenosis- no yet ripe for surg; REC- QUIT SMOKING, take ASA81 daily, low chol/ low fat diet...    Plan repeat CDoppler 11yr.   --  I spoke with patient about results and he verbalized understanding and had no questions

## 2014-12-30 DIAGNOSIS — M7502 Adhesive capsulitis of left shoulder: Secondary | ICD-10-CM | POA: Diagnosis not present

## 2014-12-31 DIAGNOSIS — M25512 Pain in left shoulder: Secondary | ICD-10-CM | POA: Diagnosis not present

## 2015-03-05 ENCOUNTER — Other Ambulatory Visit: Payer: Self-pay | Admitting: Pulmonary Disease

## 2015-03-08 ENCOUNTER — Telehealth: Payer: Self-pay | Admitting: Pulmonary Disease

## 2015-03-08 NOTE — Telephone Encounter (Signed)
Called and spoke to pt. Pt requesting refill of Lisinopril. Rx already sent in on 10.28.16 to preferred pharmacy. Pt verbalized understanding and denied any further questions or concerns at this time.

## 2015-03-11 ENCOUNTER — Ambulatory Visit (INDEPENDENT_AMBULATORY_CARE_PROVIDER_SITE_OTHER): Payer: Medicare Other | Admitting: *Deleted

## 2015-03-11 ENCOUNTER — Telehealth: Payer: Self-pay | Admitting: Cardiology

## 2015-03-11 DIAGNOSIS — Z950 Presence of cardiac pacemaker: Secondary | ICD-10-CM

## 2015-03-11 NOTE — Telephone Encounter (Signed)
Spoke with pt and reminded pt of remote transmission that is due today. Pt verbalized understanding.   

## 2015-03-12 NOTE — Progress Notes (Signed)
Remote pacemaker transmission.   

## 2015-03-31 ENCOUNTER — Encounter: Payer: Self-pay | Admitting: Cardiology

## 2015-03-31 LAB — CUP PACEART REMOTE DEVICE CHECK
Battery Impedance: 536 Ohm
Battery Remaining Longevity: 87 mo
Battery Voltage: 2.79 V
Brady Statistic AP VS Percent: 0 %
Brady Statistic AS VS Percent: 0 %
Implantable Lead Implant Date: 20091120
Implantable Lead Implant Date: 20091120
Implantable Lead Location: 753859
Implantable Lead Location: 753860
Implantable Lead Model: 5076
Lead Channel Pacing Threshold Pulse Width: 0.4 ms
Lead Channel Pacing Threshold Pulse Width: 0.4 ms
Lead Channel Setting Pacing Amplitude: 1.5 V
Lead Channel Setting Pacing Amplitude: 2 V
Lead Channel Setting Pacing Pulse Width: 0.4 ms
Lead Channel Setting Sensing Sensitivity: 5.6 mV
MDC IDC MSMT LEADCHNL RA IMPEDANCE VALUE: 496 Ohm
MDC IDC MSMT LEADCHNL RA PACING THRESHOLD AMPLITUDE: 0.5 V
MDC IDC MSMT LEADCHNL RA SENSING INTR AMPL: 2.8 mV
MDC IDC MSMT LEADCHNL RV IMPEDANCE VALUE: 559 Ohm
MDC IDC MSMT LEADCHNL RV PACING THRESHOLD AMPLITUDE: 0.5 V
MDC IDC SESS DTM: 20161103145021
MDC IDC STAT BRADY AP VP PERCENT: 5 %
MDC IDC STAT BRADY AS VP PERCENT: 95 %

## 2015-05-28 ENCOUNTER — Encounter: Payer: Self-pay | Admitting: Internal Medicine

## 2015-06-07 ENCOUNTER — Encounter: Payer: Self-pay | Admitting: Pulmonary Disease

## 2015-06-07 ENCOUNTER — Ambulatory Visit (INDEPENDENT_AMBULATORY_CARE_PROVIDER_SITE_OTHER): Payer: Medicare Other | Admitting: Pulmonary Disease

## 2015-06-07 VITALS — BP 150/80 | HR 88 | Temp 97.2°F | Ht 70.0 in | Wt 152.4 lb

## 2015-06-07 DIAGNOSIS — J449 Chronic obstructive pulmonary disease, unspecified: Secondary | ICD-10-CM | POA: Diagnosis not present

## 2015-06-07 DIAGNOSIS — I10 Essential (primary) hypertension: Secondary | ICD-10-CM

## 2015-06-07 DIAGNOSIS — F17200 Nicotine dependence, unspecified, uncomplicated: Secondary | ICD-10-CM | POA: Diagnosis not present

## 2015-06-07 DIAGNOSIS — I459 Conduction disorder, unspecified: Secondary | ICD-10-CM | POA: Diagnosis not present

## 2015-06-07 DIAGNOSIS — Z23 Encounter for immunization: Secondary | ICD-10-CM

## 2015-06-07 DIAGNOSIS — I739 Peripheral vascular disease, unspecified: Secondary | ICD-10-CM

## 2015-06-07 DIAGNOSIS — Z950 Presence of cardiac pacemaker: Secondary | ICD-10-CM

## 2015-06-07 DIAGNOSIS — I779 Disorder of arteries and arterioles, unspecified: Secondary | ICD-10-CM

## 2015-06-07 DIAGNOSIS — E785 Hyperlipidemia, unspecified: Secondary | ICD-10-CM

## 2015-06-07 MED ORDER — AZITHROMYCIN 250 MG PO TABS: ORAL_TABLET | ORAL | Status: DC

## 2015-06-07 MED ORDER — FLUTICASONE-SALMETEROL 250-50 MCG/DOSE IN AEPB: 1.0000 | INHALATION_SPRAY | Freq: Two times a day (BID) | RESPIRATORY_TRACT | Status: DC

## 2015-06-07 NOTE — Patient Instructions (Signed)
Today we updated your med list in our EPIC system...    Continue your current medications the same...  We wrote for a ZPak antibiotic, continue the Mucinex as needed, you need to QUIT THE SMOKING!!!  We decided to switch the Symbicort (too $$) to [id number]- one inhalation twice daily...  Get a copy of your Part D Plan Prescription drug formulary so we can know what your plan covers & what it doesn't cover...  We gave you the 2016 Flu vaccine today...  Call for any questions...  Let's plan a follow up visit in 56mo, sooner if needed for problems.Marland KitchenMarland Kitchen

## 2015-06-08 ENCOUNTER — Encounter: Payer: Self-pay | Admitting: Pulmonary Disease

## 2015-06-08 NOTE — Progress Notes (Signed)
Subjective:    Patient ID: BARNABAS HENRIQUES, male    DOB: 1955-08-18, 60 y.o.   MRN: [id number]  HPI 60 y/o WM here for a follow up visit... 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 ~  SEE PREV EPIC NOTES FOR OLDER DATA >>    CXR 10/13 showed normal heart size, pacer on left, clear lungs, NAD.Marland KitchenMarland Kitchen  EKG 10/13 showed NSR, rate85, LBBB, NAD...  LABS 10/13:  FLP- not at goals on diet alone;  Chems- wnl x BS=104;  CBC- wnl;  PSA= 0.54  Carotid Dopplers were ordered but never done...  Arterial Dopplers of LEs 11/13 were WNL- normal waveforms, no blockages...  ~  October 24, 2013:  66mo ROV & Dmauri is c/o some night sweats and "hot flashes" which he thinks is "the change"; still smoking 1/2 ppd but denies cardioresp symptoms; we discussed trial of "black cohosh" & noted he may need endocrine eval if routine studies are neg & symptom persists... We reviewed the following medical problems during today's office visit >>     COPD> on Mucinex OTC but he won't stay on inhalers, etc; still smoking 1/2ppd but "trying to quit"; he denies recent URI or resp exac; he declines smoking cessation help...    AbnCXR> w/ calcif granuloma right base; f/u CXR 6/15 w/ pacer on left, normal heart size, clear lungs, NAD...    Cig Smoker> still smoking ~1/2ppd, offered smoking cessation help/ counseling/ but he's not interested; I begged him to quit (again)!    HBP> on Lisin20; not on approp salt restriction (reminded); BP= 138/78, & he denies CP/ palpit/ SOB/ edema/ etc...    Cardiac> AVB, Pacer> followed by DrTaylor; EKG 10/13 shows NSR, rate85, LBBB...    Carotid Art dis> on ASA81; last CDoppler 2009 w/ severe plaque on right but no signif ICAstenoses=> needs f/u but he cancelled Dopplers in 2010 &  2013...    Hyperlipid> on diet alone;  FLP 6/15 showed TChol 173, TG 173, HDL 29, LDL 109... rec better low fat diet & incr exercise!    GERD> on Prilosec20 OTC as needed...    Headache> he uses Goodies as needed... We reviewed prob list, meds, xrays and labs> see below for updates >> he notes that OTC B12 tabs are giving him some energy...   CXR 6/15 showed normal heart size, clear lungs, NAD, pacer ok...  LABS 6/15:  FLP- ok x TG/LDL not at goals;  Chems- ok x BS=133;  CBC- wnl;  TSH=0.67;  PSA=0.55;  Sed=9...   ~  December 04, 2014:  70mo ROV & Sadiel returns for yearly ROV, states breathing is OK, not on meds, still smoking ~1/2ppd; he denies resp exac over the past yr- notes AM cough, min sput, no discoloration or hemoptysis; same SOB/DOE w/ exertion eg-walking but he notes legs bother him more than breathing... His CC is left shoulder pain, denies trauma, OTC analgesics help & we have rec trial Tramadol & refer to Ortho for XRays and further eval... We reviewed the following medical problems during today's office visit >>     COPD> on Mucinex OTC prn but he won't stay on inhalers, etc; still smoking 1/2ppd; he denies recent URI or resp exac; he declines smoking cessation help.Marland KitchenMarland Kitchen  AbnCXR> w/ calcif granuloma right base; f/u CXR 7/16 w/ pacer on left, normal heart size, clear lungs, NAD...    Cig Smoker> still smoking ~1/2ppd, offered smoking cessation help/ counseling/ but he's not interested; I begged him to quit (again)!    HBP> on Lisin20; not on approp salt restriction (reminded); BP= 146/80, & he denies CP/ palpit/ SOB/ edema/ etc...    Cardiac> AVB, Pacer> followed by DrTaylor but last seen ?2011; EKG 10/13 shows NSR, rate85, LBBB; we will arrange for Cards f/u appt..    Carotid Art dis> on ASA81; last CDoppler 2009 w/ severe plaque on right but no signif ICAstenoses=> needs f/u but he cancelled Dopplers in 2010 & 2013...    Hyperlipid> on diet alone;  FLP 6/15 showed TChol 173, TG 173, HDL  29, LDL 109... rec better low fat diet & incr exercise!    GERD> on Prilosec20 OTC as needed...    Headache> he uses Goodies as needed... EXAM showed Afeb, VSS, O2sat=96% on RA;  Heent/neck- 1+right CBruit;  Chest- decr BS at bases, otherw clear;  Heart- RR, gr1/6 SEM w/o r/g;  Abd- sogt, neg;  Ext- neg w/o c/c/e... We reviewed prob list, meds, xrays and labs> see below for updates >>   CXR showed COPD changes, pacer ok, NAD...   Spirometry 12/04/14 was attempted (see graph) but predicted values not recorded on the test...  IMP/PLAN>>  Riese has refused to quit smoking and won't take inhalers; he has cancelled several attempts at f/u CDopplers; he has not followed up w/ Cards in 5 yrs or had pacer checked; we will arrange for f/u appt w/ Cards => DrTaylor 12/10/14 w/ Carotids;  We will arrange for Ortho referral for left shoulder;  ROV w/ me in 20mo sooner if needed prn...  ADDENDUM>>  CDoppler 12/10/14 showed mixed plaque bilat, incr in right carotid stenosis, same on left;  REC- contin ASA81 daily, QUIT ALL SMOKING, low chol/ low fat diet... Repeat CDoppler 28yr...  ~  June 07, 2015:  20mo ROV & Blade is still smoking ~1/2ppd; c/o sore throat, bronchitic cough, small amt whitish sput w/o blood; he notes ?low grade fever, no c/s, and SOB/DOE w/o change (pt still not exercising);  He is supposed to be on [id number] but it is too expensive & he hasn't refilled this med (reminded that 1/2ppd costs in $2-3 per day, ~$75/mo & he could use this for his co-pay)... We will change Rx to [id number] & he is asked to get a copy of his Insurance co prescription drug formulary... We reviewed the above problems list... EXAM showed Afeb, VSS- BP=150/80 recheck, O2sat=98% on RA;  Heent/neck- 1+right CBruit;  Chest- decr BS at bases, otherw clear;  Heart- RR, gr1/6 SEM w/o r/g;  Abd- sogt, neg;  Ext- neg w/o c/c/e... IMP/PLAN>>  Kemp needs to quit all smoking before it is too late but he is clearly NOT motivated  to do so & declines smoking cessation help; we will Rx w/ ZPak for prn use & switch to.[id number]; OK 2016 Flu vaccine today...           PROBLEM LIST:    CHRONIC OBSTRUCTIVE PULMONARY DISEASE (ICD-496) - hx recurrent bronchitic episodes in the past... smokes 1/2 - 1ppd x yrs... he notes mild cough, occas sputum, denies SOB... He will not stay on inhaled Rx... ~  PFT's 11/08 showed FVC= 3.85 (79%), FEV1= 2.19 (55%), %1sec= 57, mid-flows= 32% predicted:  all c/w mod airflow obstruction...   Hx of  ABNORMAL CHEST XRAY (ICD-793.1) - prev film w/ sm calcif granuloma right base, otherw neg...  ~  CXR 1/10 showed pacer placed, clear lungs, NAD.[name]. ~  CXR 12/10 showed Pacer on left, clear lungs & mild hyperinflation, NAD.[name]. ~  CXR 10/13 showed normal heart size, pacer on left, clear lungs, NAD.[name]. ~  CXR 6/15 showed normal heart size, clear lungs, NAD, pacer ok... ~  CXR 7/16 showed COPD changes, pacer ok, NAD...  CIGARETTE SMOKER (ICD-305.1) - 30+ yrs of >1ppd smoking... Now down to 1/2 ppd... we have discussed smoking cessation on numerous occas & reviewed options again today... He continues to decline smoking cessation help...  HYPERTENSION (ICD-401.9) - hx HBP on LISINOPRIL /d...  ~  2DEcho 11/09 showed sl incr LVwall thickness, norm LVF w/ EF= 55-60%, no regional wall motion abn, mild DD, mild MR... ~  3/12:  BP= 140/80 & denies fatigue, visual changes, CP, palipit, syncope, dyspnea, edema, etc... Changed to Lisinopril20. ~  10/13:  BP= 164/88 but he ran out of Lisin20 3d ago; he denies CP, palpit, dizzy, SOB, edema... ~  4/14:  on Lisin20; not on approp salt restriction; BP= 130/90, & he denies CP/ palpit/ SOB/ edema/ etc. ~  6/15:  on Lisin20; not on approp salt restriction (reminded); BP= 138/78, & he denies CP/ palpit/ SOB/ edema/ etc... ~  7/16: on Lisin20; BP=146/80 & he remains asymptomatic, reminded to take med every day & elim salt...  CHEST WALL PAIN, HX OF (ICD-V15.89) ~   NuclearStressTest 12/09 = atypic Cp, LBBB, no perfusion defects, EF= 50% w/ abn septal motion.  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  ~  EKG 10/13 showed NSR, rate85, LBBB, NAD.  CAROTID ARTERY DISEASE >> on ASA /d... exam 11/09 shows gr 1-2 sys murmur LSB, but also has prominent bruit to base of right side of his neck w/ right > left Carotid Bruit... ~  CDopplers 11/09 showed severe plaque right ICA, & min plaque on left... no signif ICA stenoses per report. ~  f/u CDopplers 12/10 were ordered but pt never had these done... ~  f/u CDopplers 11/13 ==> pending (pt cancelled) ~  Pt has declined f/u CDopplers... ~  7/16: we arranged to f/u CDopplers and ROV w/ DrTaylor 12/10/14...  R/O PERIPHERAL ARTERIAL DISEASE >> presented 10/13 w/ leg pain on ambulation... ~  11/13:  ArtDopplers & ABIs ==> normal  BORDERLINE HYPERLIPIDEMIA >>  ~  FLP 10/13 on diet alone showed TChol 194, TG 272, HDL 30, LDL 119... We reviewed low fat diet needed...  ~  FLP 6/15 on diet alone showed TChol 173, TG 173, HDL 29, LDL 109... Needs better low fat diet & incr exercise...  GERD (ICD-530.81) - prev mild GERD symptoms that improved after cholecystectomy on 2000... uses OTC PPI as needed... ~  he is due for routine screening colonoscopy but he refuses to schedule this important screening procedure...  HEADACHE (ICD-784.0) - long hx of intermittent HA's and had neuro eval by DrStiefel in the 80's thought to be mixed muscle contraction & migraines... ~  He uses Goodie's powders as needed...  CONTACT DERMATITIS DUE TO POISON IVY (ICD-692.6)  Heath Maintenance:  OK Flu shot & Pneumovax 12/10...   Past Medical History  Diagnosis Date  . COPD (chronic obstructive pulmonary disease) (HCC)   . Abnormal chest x-ray   . Tobacco use disorder   . Hypertension   . Chest wall pain   .  AV block     history of  . Cardiac pacemaker in situ   . GERD  (gastroesophageal reflux disease)   . Headache(784.0)   . Dermatitis, contact     due to poison ivy  . Peripheral vascular disease [facility])     Past Surgical History  Procedure Laterality Date  . Laproscopic cholecystectomy  2000    by Dr. Odie Sera  . Pacemaker insertion  03/2008    by Dr. Ladona Ridgel    Outpatient Encounter Prescriptions as of 06/07/2015  Medication Sig  . aspirin 81 MG tablet Take 81 mg by mouth daily.    . Aspirin-Acetaminophen-Caffeine (GOODY HEADACHE PO) Take 1 packet by mouth daily as needed for headaches  . guaiFENesin (MUCINEX) 600 MG 12 hr tablet Take 600 mg by mouth 2 (two) times daily.  [name] lisinopril (PRINIVIL,ZESTRIL) 20 MG tablet TAKE ONE TABLET BY MOUTH ONCE DAILY  . vitamin B-12 (CYANOCOBALAMIN) 1000 MCG tablet Take 1,000 mcg by mouth daily.  [name] azithromycin (ZITHROMAX) 250 MG tablet Take as directed  . Fluticasone-Salmeterol (ADVAIR) 250-50 MCG/DOSE AEPB Inhale 1 puff into the lungs every 12 (twelve) hours.  . [DISCONTINUED] budesonide-formoterol (SYMBICORT) 160-4.5 MCG/ACT inhaler Inhale 2 puffs into the lungs 2 (two) times daily. (Patient not taking: Reported on 06/07/2015)   No facility-administered encounter medications on file as of 06/07/2015.    No Known Allergies   Current Medications, Allergies, Past Medical History, Past Surgical History, Family History, and Social History were reviewed in [employer] record.   Review of Systems    Constitutional:  Denies F/C/S, anorexia, unexpected weight change. He is c/o night sweats. HEENT:  No HA, visual changes, earache, nasal symptoms, sore throat, hoarseness. Resp:  min cough & sputum; no hemoptysis; no SOB, tightness, wheezing. Cardio:  No CP, palpit, DOE, orthopnea, edema. GI:  Denies N/V/D/C or blood in stool; no reflux, abd pain, distention, or gas. GU:  No dysuria, freq, urgency, hematuria, or flank pain. MS:  Denies joint pain, swelling, tenderness, or decr ROM; no neck  pain, back pain, etc. Neuro:  No tremors, seizures, dizziness, syncope, weakness, numbness, gait abn. Skin:  No suspicious lesions or skin rash. Heme:  No adenopathy, bruising, bleeding. Psyche: Denies confusion, sleep disturbance, hallucinations, anxiety, depression.    Objective:   Physical Exam   WD, WN, 60 y/o WM in NAD... Vital Signs:  Reviewed... BP recheck 142/ 80... General:  Alert & oriented; pleasant & cooperative... HEENT:  [place]/AT, EOM-wnl, PERRLA, Fundi-benign, EACs-clear, TMs-wnl, NOSE-clear, THROAT-clear & wnl.  Neck:  Supple w/ full ROM; no JVD; normal carotid impulses & faint right neck bruit; no thyromegaly or nodules palpated; no lymphadenopathy.  Chest:  Clear to P & A; without wheezes/ rales/ or rhonchi heard... Heart:  Regular Rhythm; norm S1 & S2 without murmurs/ rubs/ or gallops detected... Abdomen:  Soft & nontender; normal bowel sounds; no organomegaly or masses palpated... Ext:  Normal ROM; without deformities or arthritic changes; no varicose veins, venous insuffic, or edema... Derm:  No lesions noted; no rash etc... Scar on bridge of nose from prev skin ca... Lymph:  No cervical, supraclavicular, axillary, or inguinal adenopathy palpated...    Assessment & Plan:    Pulm> COPD, AbnCXR, CigSmoker> CXR w/o acute changes; must quit all smoking but he is not inclined toward counseling etc...  HBP>  BP is controlled today on Lisin20- reminded to take it every day!  Cardiac> LBBB, Pacer>  Followed by DrTaylor & due for f/u visit & pacer  check...  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  HYPERLIPID>  TG is improved but still not at goal & HDL is low; needs better low fat diet & incr exercise...  PAD>  Presented 10/13 w/ LE pain on ambulation; Art dopplers ==> wnl...  GERD> min symptoms and uses OTC Prilosec as needed...  Need for baseline Colonoscopy>  He hasn't yet done his  baseline colonoscopy & reminded again...  Headaches>  Uses Goodies as needed...   Patient's Medications  New Prescriptions   AZITHROMYCIN (ZITHROMAX) 250 MG TABLET    Take as directed   FLUTICASONE-SALMETEROL (ADVAIR) 250-50 MCG/DOSE AEPB    Inhale 1 puff into the lungs every 12 (twelve) hours.  Previous Medications   ASPIRIN 81 MG TABLET    Take 81 mg by mouth daily.     ASPIRIN-ACETAMINOPHEN-CAFFEINE (GOODY HEADACHE PO)    Take 1 packet by mouth daily as needed for headaches   GUAIFENESIN (MUCINEX) 600 MG 12 HR TABLET    Take 600 mg by mouth 2 (two) times daily.   LISINOPRIL (PRINIVIL,ZESTRIL) 20 MG TABLET    TAKE ONE TABLET BY MOUTH ONCE DAILY   VITAMIN B-12 (CYANOCOBALAMIN) 1000 MCG TABLET    Take 1,000 mcg by mouth daily.  Modified Medications   No medications on file  Discontinued Medications   BUDESONIDE-FORMOTEROL (SYMBICORT) 160-4.5 MCG/ACT INHALER    Inhale 2 puffs into the lungs 2 (two) times daily.

## 2015-06-10 ENCOUNTER — Ambulatory Visit (INDEPENDENT_AMBULATORY_CARE_PROVIDER_SITE_OTHER): Payer: Medicare Other | Admitting: *Deleted

## 2015-06-10 ENCOUNTER — Telehealth: Payer: Self-pay | Admitting: Cardiology

## 2015-06-10 DIAGNOSIS — Z950 Presence of cardiac pacemaker: Secondary | ICD-10-CM | POA: Diagnosis not present

## 2015-06-10 NOTE — Telephone Encounter (Signed)
LMOVM reminding pt to send remote transmission.   

## 2015-06-14 NOTE — Progress Notes (Signed)
Remote pacemaker transmission.   

## 2015-06-25 ENCOUNTER — Encounter: Payer: Self-pay | Admitting: Cardiology

## 2015-06-25 LAB — CUP PACEART REMOTE DEVICE CHECK
Battery Remaining Longevity: 85 mo
Battery Voltage: 2.8 V
Brady Statistic AP VP Percent: 6 %
Brady Statistic AS VS Percent: 0 %
Date Time Interrogation Session: 20170202145555
Implantable Lead Implant Date: 20091120
Implantable Lead Implant Date: 20091120
Implantable Lead Location: 753859
Implantable Lead Model: 5076
Implantable Lead Model: 5076
Lead Channel Impedance Value: 598 Ohm
Lead Channel Pacing Threshold Amplitude: 0.5 V
Lead Channel Pacing Threshold Pulse Width: 0.4 ms
Lead Channel Sensing Intrinsic Amplitude: 2.8 mV
Lead Channel Setting Pacing Amplitude: 1.5 V
Lead Channel Setting Pacing Pulse Width: 0.4 ms
Lead Channel Setting Sensing Sensitivity: 5.6 mV
MDC IDC LEAD LOCATION: 753860
MDC IDC MSMT BATTERY IMPEDANCE: 585 Ohm
MDC IDC MSMT LEADCHNL RA IMPEDANCE VALUE: 496 Ohm
MDC IDC MSMT LEADCHNL RA PACING THRESHOLD AMPLITUDE: 0.625 V
MDC IDC MSMT LEADCHNL RA PACING THRESHOLD PULSEWIDTH: 0.4 ms
MDC IDC SET LEADCHNL RV PACING AMPLITUDE: 2 V
MDC IDC STAT BRADY AP VS PERCENT: 0 %
MDC IDC STAT BRADY AS VP PERCENT: 94 %

## 2015-08-25 ENCOUNTER — Telehealth: Payer: Self-pay | Admitting: Pulmonary Disease

## 2015-08-25 MED ORDER — LISINOPRIL 20 MG PO TABS: 20.0000 mg | ORAL_TABLET | Freq: Every day | ORAL | Status: DC

## 2015-08-25 NOTE — Telephone Encounter (Signed)
Called spoke with pt. He states he needs a refill on his lisinopril and would like it sent to [facility] in [place]. I explained to him that I would send the medication today. He voiced understanding and had no further questions.

## 2015-09-08 ENCOUNTER — Encounter: Payer: Medicare Other | Admitting: *Deleted

## 2015-09-10 ENCOUNTER — Encounter: Payer: Self-pay | Admitting: Cardiology

## 2015-09-27 ENCOUNTER — Ambulatory Visit (INDEPENDENT_AMBULATORY_CARE_PROVIDER_SITE_OTHER): Payer: Medicare Other | Admitting: *Deleted

## 2015-09-27 DIAGNOSIS — Z950 Presence of cardiac pacemaker: Secondary | ICD-10-CM | POA: Diagnosis not present

## 2015-09-28 NOTE — Progress Notes (Signed)
Remote pacemaker transmission.   

## 2015-10-15 LAB — CUP PACEART REMOTE DEVICE CHECK
Battery Remaining Longevity: 85 mo
Battery Voltage: 2.79 V
Date Time Interrogation Session: 20170520164118
Implantable Lead Implant Date: 20091120
Implantable Lead Location: 753860
Implantable Lead Model: 5076
Implantable Lead Model: 5076
Lead Channel Pacing Threshold Amplitude: 0.625 V
Lead Channel Pacing Threshold Amplitude: 0.625 V
Lead Channel Pacing Threshold Pulse Width: 0.4 ms
Lead Channel Setting Pacing Amplitude: 1.5 V
Lead Channel Setting Pacing Amplitude: 2 V
Lead Channel Setting Sensing Sensitivity: 5.6 mV
MDC IDC LEAD IMPLANT DT: 20091120
MDC IDC LEAD LOCATION: 753859
MDC IDC MSMT BATTERY IMPEDANCE: 610 Ohm
MDC IDC MSMT LEADCHNL RA IMPEDANCE VALUE: 497 Ohm
MDC IDC MSMT LEADCHNL RA PACING THRESHOLD PULSEWIDTH: 0.4 ms
MDC IDC MSMT LEADCHNL RA SENSING INTR AMPL: 2.8 mV
MDC IDC MSMT LEADCHNL RV IMPEDANCE VALUE: 698 Ohm
MDC IDC SET LEADCHNL RV PACING PULSEWIDTH: 0.4 ms
MDC IDC STAT BRADY AP VP PERCENT: 5 %
MDC IDC STAT BRADY AP VS PERCENT: 0 %
MDC IDC STAT BRADY AS VP PERCENT: 95 %
MDC IDC STAT BRADY AS VS PERCENT: 0 %

## 2015-10-21 ENCOUNTER — Encounter: Payer: Self-pay | Admitting: Cardiology

## 2015-12-06 ENCOUNTER — Encounter: Payer: Self-pay | Admitting: Pulmonary Disease

## 2015-12-06 ENCOUNTER — Ambulatory Visit (INDEPENDENT_AMBULATORY_CARE_PROVIDER_SITE_OTHER): Payer: Medicare Other | Admitting: Pulmonary Disease

## 2015-12-06 ENCOUNTER — Other Ambulatory Visit (INDEPENDENT_AMBULATORY_CARE_PROVIDER_SITE_OTHER): Payer: Medicare Other

## 2015-12-06 ENCOUNTER — Ambulatory Visit (INDEPENDENT_AMBULATORY_CARE_PROVIDER_SITE_OTHER)
Admission: RE | Admit: 2015-12-06 | Discharge: 2015-12-06 | Disposition: A | Discharge status: Home / Self Care | Payer: Medicare Other | Source: Ambulatory Visit | Attending: Pulmonary Disease | Admitting: Pulmonary Disease

## 2015-12-06 VITALS — BP 118/74 | HR 86 | Temp 98.6°F | Ht 69.0 in | Wt 149.2 lb

## 2015-12-06 DIAGNOSIS — F17200 Nicotine dependence, unspecified, uncomplicated: Secondary | ICD-10-CM | POA: Diagnosis not present

## 2015-12-06 DIAGNOSIS — K219 Gastro-esophageal reflux disease without esophagitis: Secondary | ICD-10-CM

## 2015-12-06 DIAGNOSIS — F411 Generalized anxiety disorder: Secondary | ICD-10-CM

## 2015-12-06 DIAGNOSIS — I10 Essential (primary) hypertension: Secondary | ICD-10-CM

## 2015-12-06 DIAGNOSIS — N320 Bladder-neck obstruction: Secondary | ICD-10-CM

## 2015-12-06 DIAGNOSIS — J449 Chronic obstructive pulmonary disease, unspecified: Secondary | ICD-10-CM

## 2015-12-06 DIAGNOSIS — I459 Conduction disorder, unspecified: Secondary | ICD-10-CM

## 2015-12-06 DIAGNOSIS — Z950 Presence of cardiac pacemaker: Secondary | ICD-10-CM

## 2015-12-06 DIAGNOSIS — J4489 Other specified chronic obstructive pulmonary disease: Secondary | ICD-10-CM

## 2015-12-06 LAB — CBC WITH DIFFERENTIAL/PLATELET
Basophils Absolute: 0 10*3/uL (ref 0.0–0.1)
Basophils Relative: 0.3 % (ref 0.0–3.0)
EOS ABS: 0.2 10*3/uL (ref 0.0–0.7)
EOS PCT: 1.5 % (ref 0.0–5.0)
HCT: 47.7 % (ref 39.0–52.0)
HEMOGLOBIN: 16.1 g/dL (ref 13.0–17.0)
LYMPHS ABS: 3.1 10*3/uL (ref 0.7–4.0)
Lymphocytes Relative: 29 % (ref 12.0–46.0)
MCHC: 33.8 g/dL (ref 30.0–36.0)
MCV: 93.7 fl (ref 78.0–100.0)
MONO ABS: 0.8 10*3/uL (ref 0.1–1.0)
Monocytes Relative: 7.1 % (ref 3.0–12.0)
NEUTROS PCT: 62.1 % (ref 43.0–77.0)
Neutro Abs: 6.7 10*3/uL (ref 1.4–7.7)
Platelets: 222 10*3/uL (ref 150.0–400.0)
RBC: 5.09 Mil/uL (ref 4.22–5.81)
RDW: 13.9 % (ref 11.5–15.5)
WBC: 10.7 10*3/uL — ABNORMAL HIGH (ref 4.0–10.5)

## 2015-12-06 LAB — COMPREHENSIVE METABOLIC PANEL
ALBUMIN: 4.3 g/dL (ref 3.5–5.2)
ALT: 18 U/L (ref 0–53)
AST: 14 U/L (ref 0–37)
Alkaline Phosphatase: 103 U/L (ref 39–117)
BILIRUBIN TOTAL: 0.5 mg/dL (ref 0.2–1.2)
BUN: 11 mg/dL (ref 6–23)
CALCIUM: 9.7 mg/dL (ref 8.4–10.5)
CO2: 28 meq/L (ref 19–32)
Chloride: 103 mEq/L (ref 96–112)
Creatinine, Ser: 0.82 mg/dL (ref 0.40–1.50)
GFR: 101.53 mL/min (ref >60.00)
Glucose, Bld: 199 mg/dL — ABNORMAL HIGH (ref 70–99)
Potassium: 4.7 mEq/L (ref 3.5–5.1)
Sodium: 139 mEq/L (ref 135–145)
Total Protein: 7.3 g/dL (ref 6.0–8.3)

## 2015-12-06 LAB — PSA: PSA: 0.59 ng/mL (ref 0.10–4.00)

## 2015-12-06 LAB — TSH: TSH: 1.33 u[IU]/mL (ref 0.35–4.50)

## 2015-12-06 NOTE — Progress Notes (Signed)
Subjective:    Patient ID: Andrew Wiggins, male    DOB: 1955-08-18, 60 y.o.   MRN: [id number]  HPI 60 y/o WM here for a follow up visit... 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 ~  SEE PREV EPIC NOTES FOR OLDER DATA >>    CXR 10/13 showed normal heart size, pacer on left, clear lungs, NAD.Marland KitchenMarland Kitchen  EKG 10/13 showed NSR, rate85, LBBB, NAD...  LABS 10/13:  FLP- not at goals on diet alone;  Chems- wnl x BS=104;  CBC- wnl;  PSA= 0.54  Carotid Dopplers were ordered but never done...  Arterial Dopplers of LEs 11/13 were WNL- normal waveforms, no blockages...  ~  October 24, 2013:  66mo ROV & Andrew Wiggins is c/o some night sweats and "hot flashes" which he thinks is "the change"; still smoking 1/2 ppd but denies cardioresp symptoms; we discussed trial of "black cohosh" & noted he may need endocrine eval if routine studies are neg & symptom persists... We reviewed the following medical problems during today's office visit >>     COPD> on Mucinex OTC but he won't stay on inhalers, etc; still smoking 1/2ppd but "trying to quit"; he denies recent URI or resp exac; he declines smoking cessation help...    AbnCXR> w/ calcif granuloma right base; f/u CXR 6/15 w/ pacer on left, normal heart size, clear lungs, NAD...    Cig Smoker> still smoking ~1/2ppd, offered smoking cessation help/ counseling/ but he's not interested; I begged him to quit (again)!    HBP> on Lisin20; not on approp salt restriction (reminded); BP= 138/78, & he denies CP/ palpit/ SOB/ edema/ etc...    Cardiac> AVB, Pacer> followed by DrTaylor; EKG 10/13 shows NSR, rate85, LBBB...    Carotid Art dis> on ASA81; last CDoppler 2009 w/ severe plaque on right but no signif ICAstenoses=> needs f/u but he cancelled Dopplers in 2010 &  2013...    Hyperlipid> on diet alone;  FLP 6/15 showed TChol 173, TG 173, HDL 29, LDL 109... rec better low fat diet & incr exercise!    GERD> on Prilosec20 OTC as needed...    Headache> he uses Goodies as needed... We reviewed prob list, meds, xrays and labs> see below for updates >> he notes that OTC B12 tabs are giving him some energy...   CXR 6/15 showed normal heart size, clear lungs, NAD, pacer ok...  LABS 6/15:  FLP- ok x TG/LDL not at goals;  Chems- ok x BS=133;  CBC- wnl;  TSH=0.67;  PSA=0.55;  Sed=9...   ~  December 04, 2014:  70mo ROV & Andrew Wiggins returns for yearly ROV, states breathing is OK, not on meds, still smoking ~1/2ppd; he denies resp exac over the past yr- notes AM cough, min sput, no discoloration or hemoptysis; same SOB/DOE w/ exertion eg-walking but he notes legs bother him more than breathing... His CC is left shoulder pain, denies trauma, OTC analgesics help & we have rec trial Tramadol & refer to Ortho for XRays and further eval... We reviewed the following medical problems during today's office visit >>     COPD> on Mucinex OTC prn but he won't stay on inhalers, etc; still smoking 1/2ppd; he denies recent URI or resp exac; he declines smoking cessation help.Marland KitchenMarland Kitchen  AbnCXR> w/ calcif granuloma right base; f/u CXR 7/16 w/ pacer on left, normal heart size, clear lungs, NAD...    Cig Smoker> still smoking ~1/2ppd, offered smoking cessation help/ counseling/ but he's not interested; I begged him to quit (again)!    HBP> on Lisin20; not on approp salt restriction (reminded); BP= 146/80, & he denies CP/ palpit/ SOB/ edema/ etc...    Cardiac> AVB, Pacer> followed by DrTaylor but last seen ?2011; EKG 10/13 shows NSR, rate85, LBBB; we will arrange for Cards f/u appt..    Carotid Art dis> on ASA81; last CDoppler 2009 w/ severe plaque on right but no signif ICAstenoses=> needs f/u but he cancelled Dopplers in 2010 & 2013...    Hyperlipid> on diet alone;  FLP 6/15 showed TChol 173, TG 173, HDL  29, LDL 109... rec better low fat diet & incr exercise!    GERD> on Prilosec20 OTC as needed...    Headache> he uses Goodies as needed... EXAM showed Afeb, VSS, O2sat=96% on RA;  Heent/neck- 1+right CBruit;  Chest- decr BS at bases, otherw clear;  Heart- RR, gr1/6 SEM w/o r/g;  Abd- sogt, neg;  Ext- neg w/o c/c/e... We reviewed prob list, meds, xrays and labs> see below for updates >>   CXR showed COPD changes, pacer ok, NAD...   Spirometry 12/04/14 was attempted (see graph) but predicted values not recorded on the test...  IMP/PLAN>>  Andrew Wiggins has refused to quit smoking and won't take inhalers; he has cancelled several attempts at f/u CDopplers; he has not followed up w/ Cards in 5 yrs or had pacer checked; we will arrange for f/u appt w/ Cards => DrTaylor 12/10/14 w/ Carotids;  We will arrange for Ortho referral for left shoulder;  ROV w/ me in 28mo sooner if needed prn...  ADDENDUM>>  CDoppler 12/10/14 showed mixed plaque bilat, incr in right carotid stenosis, same on left;  REC- contin ASA81 daily, QUIT ALL SMOKING, low chol/ low fat diet... Repeat CDoppler 538yr...  ~  June 07, 2015:  28mo ROV & Andrew Wiggins is still smoking ~1/2ppd; c/o sore throat, bronchitic cough, small amt whitish sput w/o blood; he notes ?low grade fever, no c/s, and SOB/DOE w/o change (pt still not exercising);  He is supposed to be on [id number] but it is too expensive & he hasn't refilled this med (reminded that 1/2ppd costs in $2-3 per day, ~$75/mo & he could use this for his co-pay)... We will change Rx to Advair250Bid & he is asked to get a copy of his Insurance co prescription drug formulary... We reviewed the above problems list... EXAM showed Afeb, VSS- BP=150/80 recheck, O2sat=98% on RA;  Heent/neck- 1+right CBruit;  Chest- decr BS at bases, otherw clear;  Heart- RR, gr1/6 SEM w/o r/g;  Abd- sogt, neg;  Ext- neg w/o c/c/e... IMP/PLAN>>  Andrew Wiggins needs to quit all smoking before it is too late but he is clearly NOT motivated  to do so & declines smoking cessation help; we will Rx w/ ZPak for prn use & switch to.[id number]; OK 2016 Flu vaccine today...   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 We reviewed the following medical problems during today's office visit >>     COPD> on Mucinex OTC prn but he won't stay on inhalers ?due to cost; still smoking but <1/2ppd now; he denies recent URI or resp exac; he declines smoking cessation help...    AbnCXR> w/ calcif granuloma right base; f/u CXR 7/17 w/ pacer on left, normal heart size, clear lungs, NAD...    Cig Smoker> still smoking <1/2ppd now, offered smoking cessation help/ counseling/ but he's not interested; I begged him to quit (again)!    HBP> on Lisin20 (med refilled); not on approp salt restriction (reminded); BP= 118/74, & he denies CP/ palpit/ SOB/ edema/ etc...    Cardiac> AVB, Pacer> followed by DrTaylor but last seen 8/16 (chart reviewed by me)- Hx CHB, PPM insertion, HBP; device functioning normally, no change in meds...    Carotid Art dis> on ASA81; last CDoppler 2009 w/ severe plaque on right but no signif ICAstenoses=> needs f/u but he cancelled Dopplers in 2010 & 2013...    Hyperlipid> on diet alone;  FLP 6/15 showed TChol 173, TG 173, HDL 29, LDL 109... rec better low fat diet & incr exercise! He is once again NOT fasting for labs today...    NEW= DM> blood sugar today = 199 (prev 104-133); we reviewed need for low carb diet & incr exercise & no smoking=> rec to start METFORMIN-ER 500mg  Qam...    GERD> on Prilosec20 OTC as needed...    Headache> he uses  Goodies as needed... EXAM showed Afeb, VSS, O2sat=98% on RA;  Heent/neck- 1+right CBruit;  Chest- decr BS at bases, otherw clear;  Heart- RR, gr1/6 SEM w/o r/g;  Abd- sogt, neg;  Ext- neg w/o c/c/e; Neuro intact w/o focal abn x R-CBruit...  CXR 12/06/15 (independently reviewed by me in the PACS system) showed norm heart size, hyperinflated lungs c/w COPD but clear 7 NAD, pacemaker on left...  Spirometry 12/06/15 (independently interpreted by me) FVC=3.27 (71%), FEV1=2.32 (67%), %1sec=71, mid-flows reduced at 54% predicted;  This represents mild airflow obstruction & GOLD Stage2 COPD (can't r/o mild superimposed restriction)...  LABS 12/06/15>  Chems- ok x BS=199;  CBC- wnl;  TSH=1.33;  PSA=0.59... IMP/PLAN>>  Zaniel has some underlying COPD & desperately needs to quit smoking; rec to start [id number] but unable to afford w/o insurance coverage- we discussed the obvious to quit smoking 7 save the money towards needed meds but he is not impressed; he will look into PartD coverage... we plan ROV in 70mo w/ labs and A1c...          PROBLEM LIST:    CHRONIC OBSTRUCTIVE PULMONARY DISEASE (ICD-496) - hx recurrent bronchitic episodes in the past... smokes 1/2 - 1ppd x yrs... he notes mild cough, occas sputum, denies SOB... He will not stay on inhaled Rx... ~  PFT's 11/08 showed FVC= 3.85 (79%), FEV1= 2.19 (55%), %1sec= 57, mid-flows= 32% predicted:  all c/w mod airflow obstruction...   Hx of ABNORMAL CHEST XRAY (ICD-793.1) - prev film w/ sm calcif granuloma right base, otherw neg...  ~  CXR 1/10 showed pacer placed, clear lungs, NAD.[name]. ~  CXR 12/10 showed Pacer on left, clear lungs & mild hyperinflation, NAD.[name]. ~  CXR 10/13 showed normal heart size, pacer on left, clear lungs, NAD.[name]. ~  CXR 6/15 showed normal heart size, clear lungs, NAD, pacer ok... ~  CXR 7/16 showed COPD changes, pacer ok,  NAD.Marland KitchenMarland Kitchen  CIGARETTE SMOKER (ICD-305.1) - 30+ yrs of >1ppd smoking... Now down to 1/2 ppd... we have  discussed smoking cessation on numerous occas & reviewed options again today... He continues to decline smoking cessation help...  HYPERTENSION (ICD-401.9) - hx HBP on LISINOPRIL 20mg /d...  ~  2DEcho 11/09 showed sl incr LVwall thickness, norm LVF w/ EF= 55-60%, no regional wall motion abn, mild DD, mild MR... ~  3/12:  BP= 140/80 & denies fatigue, visual changes, CP, palipit, syncope, dyspnea, edema, etc... Changed to Lisinopril20. ~  10/13:  BP= 164/88 but he ran out of Lisin20 3d ago; he denies CP, palpit, dizzy, SOB, edema... ~  4/14:  on Lisin20; not on approp salt restriction; BP= 130/90, & he denies CP/ palpit/ SOB/ edema/ etc. ~  6/15:  on Lisin20; not on approp salt restriction (reminded); BP= 138/78, & he denies CP/ palpit/ SOB/ edema/ etc... ~  7/16: on Lisin20; BP=146/80 & he remains asymptomatic, reminded to take med every day & elim salt...  CHEST WALL PAIN, HX OF (ICD-V15.89) ~  NuclearStressTest 12/09 = atypic Cp, LBBB, no perfusion defects, EF= 50% w/ abn septal motion.  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  ~  EKG 10/13 showed NSR, rate85, LBBB, NAD.  CAROTID ARTERY DISEASE >> on ASA 81mg /d... exam 11/09 shows gr 1-2 sys murmur LSB, but also has prominent bruit to base of right side of his neck w/ right > left Carotid Bruit... ~  CDopplers 11/09 showed severe plaque right ICA, & min plaque on left... no signif ICA stenoses per report. ~  f/u CDopplers 12/10 were ordered but pt never had these done... ~  f/u CDopplers 11/13 ==> pending (pt cancelled) ~  Pt has declined f/u CDopplers... ~  7/16: we arranged to f/u CDopplers and ROV w/ DrTaylor 12/10/14...  R/O PERIPHERAL ARTERIAL DISEASE >> presented 10/13 w/ leg pain on ambulation... ~  11/13:  ArtDopplers & ABIs ==> normal  BORDERLINE HYPERLIPIDEMIA >>  ~  FLP 10/13 on diet alone showed TChol 194, TG 272, HDL 30, LDL 119... We reviewed low fat diet needed...  ~   FLP 6/15 on diet alone showed TChol 173, TG 173, HDL 29, LDL 109... Needs better low fat diet & incr exercise...  GERD (ICD-530.81) - prev mild GERD symptoms that improved after cholecystectomy on 2000... uses OTC PPI as needed... ~  he is due for routine screening colonoscopy but he refuses to schedule this important screening procedure...  HEADACHE (ICD-784.0) - long hx of intermittent HA's and had neuro eval by DrStiefel in the 80's thought to be mixed muscle contraction & migraines... ~  He uses Goodie's powders as needed...  CONTACT DERMATITIS DUE TO POISON IVY (ICD-692.6)  Heath Maintenance:  OK Flu shot & Pneumovax 12/10...   Past Medical History:  Diagnosis Date  . Abnormal chest x-ray   . AV block    history of  . Cardiac pacemaker in situ   . Chest wall pain   . COPD (chronic obstructive pulmonary disease) (HCC)   . Dermatitis, contact    due to poison ivy  . GERD (gastroesophageal reflux disease)   . Headache(784.0)   . Hypertension   . Peripheral vascular disease (HCC)   . Tobacco use disorder     Past Surgical History:  Procedure Laterality Date  . laproscopic cholecystectomy  2000   by Dr. Odie Sera  . PACEMAKER INSERTION  03/2008   by  Dr. Ladona Ridgel    Outpatient Encounter Prescriptions as of 12/06/2015  Medication Sig Dispense Refill  . aspirin 81 MG tablet Take 81 mg by mouth daily.      . Aspirin-Acetaminophen-Caffeine (GOODY HEADACHE PO) Take 1 packet by mouth daily as needed for headaches    . cholecalciferol (VITAMIN D) 1000 units tablet Take 1,000 Units by mouth daily.    . Fluticasone-Salmeterol (ADVAIR) 250-50 MCG/DOSE AEPB Inhale 1 puff into the lungs every 12 (twelve) hours. 60 each 11  . guaiFENesin (MUCINEX) 600 MG 12 hr tablet Take 600 mg by mouth 2 (two) times daily.    [name] lisinopril (PRINIVIL,ZESTRIL) 20 MG tablet Take 1 tablet (20 mg total) by mouth daily. 90 tablet 1  . vitamin B-12 (CYANOCOBALAMIN) 1000 MCG tablet Take 1,000 mcg by mouth  daily.    . [DISCONTINUED] azithromycin (ZITHROMAX) 250 MG tablet Take as directed (Patient not taking: Reported on 12/06/2015) 6 tablet 0   No facility-administered encounter medications on file as of 12/06/2015.     No Known Allergies   Current Medications, Allergies, Past Medical History, Past Surgical History, Family History, and Social History were reviewed in [employer] record.   Review of Systems    Constitutional:  Denies F/C/S, anorexia, unexpected weight change. He is c/o night sweats. HEENT:  No HA, visual changes, earache, nasal symptoms, sore throat, hoarseness. Resp:  min cough & sputum; no hemoptysis; no SOB, tightness, wheezing. Cardio:  No CP, palpit, DOE, orthopnea, edema. GI:  Denies N/V/D/C or blood in stool; no reflux, abd pain, distention, or gas. GU:  No dysuria, freq, urgency, hematuria, or flank pain. MS:  Denies joint pain, swelling, tenderness, or decr ROM; no neck pain, back pain, etc. Neuro:  No tremors, seizures, dizziness, syncope, weakness, numbness, gait abn. Skin:  No suspicious lesions or skin rash. Heme:  No adenopathy, bruising, bleeding. Psyche: Denies confusion, sleep disturbance, hallucinations, anxiety, depression.    Objective:   Physical Exam   WD, WN, 60 y/o WM in NAD... Vital Signs:  Reviewed... BP recheck 142/ 80... General:  Alert & oriented; pleasant & cooperative... HEENT:  Andrew Wiggins/AT, EOM-wnl, PERRLA, Fundi-benign, EACs-clear, TMs-wnl, NOSE-clear, THROAT-clear & wnl.  Neck:  Supple w/ full ROM; no JVD; normal carotid impulses & faint right neck bruit; no thyromegaly or nodules palpated; no lymphadenopathy.  Chest:  Clear to P & A; without wheezes/ rales/ or rhonchi heard... Heart:  Regular Rhythm; norm S1 & S2 without murmurs/ rubs/ or gallops detected... Abdomen:  Soft & nontender; normal bowel sounds; no organomegaly or masses palpated... Ext:  Normal ROM; without deformities or arthritic changes; no varicose  veins, venous insuffic, or edema... Derm:  No lesions noted; no rash etc... Scar on bridge of nose from prev skin ca... Lymph:  No cervical, supraclavicular, axillary, or inguinal adenopathy palpated...    Assessment & Plan:    Pulm> COPD, AbnCXR, CigSmoker> CXR w/o acute changes; must quit all smoking but he is not inclined toward counseling etc...  HBP>  BP is controlled today on Lisin20- reminded to take it every day!  Cardiac> LBBB, Pacer>  Followed by DrTaylor & due for f/u visit & pacer check...  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  HYPERLIPID>  TG is improved but still not at goal & HDL is low; needs better low fat diet & incr exercise...  NEW => DM w/ FBS 11/2015 = 199 & we  reviewed diet, exercise, start METFORMIN-ER  Qam...  PAD>  Presented 10/13 w/ LE pain on ambulation; Art dopplers ==> wnl...  GERD> min symptoms and uses OTC Prilosec as needed...  Need for baseline Colonoscopy>  He hasn't yet done his baseline colonoscopy & reminded again...  Headaches>  Uses Goodies as needed...   Patient's Medications  New Prescriptions   No medications on file  Previous Medications   ASPIRIN 81 MG TABLET    Take 81 mg by mouth daily.     ASPIRIN-ACETAMINOPHEN-CAFFEINE (GOODY HEADACHE PO)    Take 1 packet by mouth daily as needed for headaches   CHOLECALCIFEROL (VITAMIN D) 1000 UNITS TABLET    Take 1,000 Units by mouth daily.   FLUTICASONE-SALMETEROL (ADVAIR) 250-50 MCG/DOSE AEPB    Inhale 1 puff into the lungs every 12 (twelve) hours.   GUAIFENESIN (MUCINEX) 600 MG 12 HR TABLET    Take 600 mg by mouth 2 (two) times daily.   LISINOPRIL (PRINIVIL,ZESTRIL) 20 MG TABLET    Take 1 tablet (20 mg total) by mouth daily.   VITAMIN B-12 (CYANOCOBALAMIN) 1000 MCG TABLET    Take 1,000 mcg by mouth daily.  Modified Medications   No medications on file  Discontinued Medications   AZITHROMYCIN  (ZITHROMAX) 250 MG TABLET    Take as directed

## 2015-12-06 NOTE — Patient Instructions (Signed)
Today we updated your med list in our EPIC system...    Continue your current medications the same...  We discussed contacting [employer] or Automotive engineer insurance man...    Ask about Medicare Plan D options (drug coverage)    Consider an Advantage plan (has Part B & Part D combined)  Once you get a Part d plan to help cover the cost of meds- ask for their PRESCRIPTION DRUG FORMULARY which lists all the meds they cover & their co-pays (so we can pick the best & least expensive meds for you)...  Today we checked your spirometry breathing test, a follow up CXR & Blood work...    We will contact you w/ the results when available...   I know that you know that you must quit smoking to preserve your lung function...  Call for any questions...  Let's plan a follow up visit in 55mo, sooner if needed for problems.Marland KitchenMarland Kitchen

## 2015-12-08 ENCOUNTER — Other Ambulatory Visit: Payer: Self-pay | Admitting: Pulmonary Disease

## 2015-12-08 MED ORDER — METFORMIN HCL ER (MOD) 500 MG PO TB24: 500.0000 mg | ORAL_TABLET | Freq: Every day | ORAL | 1 refills | Status: DC

## 2015-12-09 ENCOUNTER — Other Ambulatory Visit: Payer: Self-pay | Admitting: Pulmonary Disease

## 2015-12-09 MED ORDER — METFORMIN HCL ER 500 MG PO TB24: 500.0000 mg | ORAL_TABLET | Freq: Every day | ORAL | 3 refills | Status: DC

## 2016-02-11 DIAGNOSIS — K859 Acute pancreatitis without necrosis or infection, unspecified: Secondary | ICD-10-CM | POA: Diagnosis not present

## 2016-03-06 ENCOUNTER — Telehealth: Payer: Self-pay | Admitting: Pulmonary Disease

## 2016-03-06 ENCOUNTER — Other Ambulatory Visit: Payer: Self-pay | Admitting: Pulmonary Disease

## 2016-03-06 NOTE — Telephone Encounter (Signed)
We already refilled this today  I spoke with the pt and notified that this has been done  Nothing further needed

## 2016-03-06 NOTE — Telephone Encounter (Signed)
lmomtcb x1 

## 2016-03-06 NOTE — Telephone Encounter (Signed)
Patient is returning call.  States he has no refills on BP per pharmacy.  CB is [phone].  He states he is out of medication.

## 2016-06-07 ENCOUNTER — Ambulatory Visit (INDEPENDENT_AMBULATORY_CARE_PROVIDER_SITE_OTHER): Payer: PPO | Admitting: Pulmonary Disease

## 2016-06-07 ENCOUNTER — Other Ambulatory Visit (INDEPENDENT_AMBULATORY_CARE_PROVIDER_SITE_OTHER): Payer: PPO

## 2016-06-07 VITALS — BP 140/78 | HR 82 | Temp 97.0°F | Ht 69.0 in | Wt 148.0 lb

## 2016-06-07 DIAGNOSIS — R7309 Other abnormal glucose: Secondary | ICD-10-CM | POA: Diagnosis not present

## 2016-06-07 DIAGNOSIS — Z950 Presence of cardiac pacemaker: Secondary | ICD-10-CM | POA: Diagnosis not present

## 2016-06-07 DIAGNOSIS — I10 Essential (primary) hypertension: Secondary | ICD-10-CM | POA: Diagnosis not present

## 2016-06-07 DIAGNOSIS — Z23 Encounter for immunization: Secondary | ICD-10-CM

## 2016-06-07 DIAGNOSIS — J449 Chronic obstructive pulmonary disease, unspecified: Secondary | ICD-10-CM | POA: Diagnosis not present

## 2016-06-07 DIAGNOSIS — E039 Hypothyroidism, unspecified: Secondary | ICD-10-CM | POA: Diagnosis not present

## 2016-06-07 DIAGNOSIS — I459 Conduction disorder, unspecified: Secondary | ICD-10-CM | POA: Diagnosis not present

## 2016-06-07 DIAGNOSIS — F17200 Nicotine dependence, unspecified, uncomplicated: Secondary | ICD-10-CM

## 2016-06-07 DIAGNOSIS — E785 Hyperlipidemia, unspecified: Secondary | ICD-10-CM | POA: Diagnosis not present

## 2016-06-07 DIAGNOSIS — K219 Gastro-esophageal reflux disease without esophagitis: Secondary | ICD-10-CM | POA: Diagnosis not present

## 2016-06-07 LAB — COMPREHENSIVE METABOLIC PANEL
ALT: 17 U/L (ref 0–53)
AST: 14 U/L (ref 0–37)
Albumin: 4.4 g/dL (ref 3.5–5.2)
Alkaline Phosphatase: 97 U/L (ref 39–117)
BILIRUBIN TOTAL: 0.5 mg/dL (ref 0.2–1.2)
BUN: 12 mg/dL (ref 6–23)
CO2: 31 meq/L (ref 19–32)
CREATININE: 0.8 mg/dL (ref 0.40–1.50)
Calcium: 9.8 mg/dL (ref 8.4–10.5)
Chloride: 103 mEq/L (ref 96–112)
GFR: 104.29 mL/min (ref >60.00)
GLUCOSE: 279 mg/dL — AB (ref 70–99)
Potassium: 5 mEq/L (ref 3.5–5.1)
Sodium: 137 mEq/L (ref 135–145)
Total Protein: 7.6 g/dL (ref 6.0–8.3)

## 2016-06-07 LAB — CBC WITH DIFFERENTIAL/PLATELET
BASOS ABS: 0 10*3/uL (ref 0.0–0.1)
Basophils Relative: 0.4 % (ref 0.0–3.0)
Eosinophils Absolute: 0.1 10*3/uL (ref 0.0–0.7)
Eosinophils Relative: 1.2 % (ref 0.0–5.0)
HCT: 51.3 % (ref 39.0–52.0)
HEMOGLOBIN: 17.6 g/dL — AB (ref 13.0–17.0)
LYMPHS PCT: 29.8 % (ref 12.0–46.0)
Lymphs Abs: 2.6 10*3/uL (ref 0.7–4.0)
MCHC: 34.3 g/dL (ref 30.0–36.0)
MCV: 93.6 fl (ref 78.0–100.0)
MONOS PCT: 8 % (ref 3.0–12.0)
Monocytes Absolute: 0.7 10*3/uL (ref 0.1–1.0)
Neutro Abs: 5.3 10*3/uL (ref 1.4–7.7)
Neutrophils Relative %: 60.6 % (ref 43.0–77.0)
Platelets: 173 10*3/uL (ref 150.0–400.0)
RBC: 5.48 Mil/uL (ref 4.22–5.81)
RDW: 14.2 % (ref 11.5–15.5)
WBC: 8.8 10*3/uL (ref 4.0–10.5)

## 2016-06-07 LAB — LIPID PANEL
Cholesterol: 209 mg/dL — ABNORMAL HIGH (ref 0–200)
HDL: 30.2 mg/dL — ABNORMAL LOW (ref >39.00)
NonHDL: 178.64
Total CHOL/HDL Ratio: 7
Triglycerides: 246 mg/dL — ABNORMAL HIGH (ref 0.0–149.0)
VLDL: 49.2 mg/dL — ABNORMAL HIGH (ref 0.0–40.0)

## 2016-06-07 LAB — HEMOGLOBIN A1C: Hgb A1c MFr Bld: 10.9 % — ABNORMAL HIGH (ref 4.6–6.5)

## 2016-06-07 LAB — TSH: TSH: 1.2 u[IU]/mL (ref 0.35–4.50)

## 2016-06-07 LAB — LDL CHOLESTEROL, DIRECT: Direct LDL: 110 mg/dL

## 2016-06-07 MED ORDER — LISINOPRIL 20 MG PO TABS: 20.0000 mg | ORAL_TABLET | Freq: Every day | ORAL | 1 refills | Status: DC

## 2016-06-07 NOTE — Patient Instructions (Addendum)
Today we updated your med list in our EPIC system...    Continue your current medications the same...  We checked your fasting blood work including the A1c test to check your diabetes & determine if you needs meds...    We will contact you w/ the results when available...   Please work on smoking cessation it is vitally important!!! (as we discussed)...  Call for any questions...  Let's plan a follow up visit in 6mo, sooner if needed for problems.[name]..[name]

## 2016-06-09 ENCOUNTER — Encounter: Payer: Self-pay | Admitting: Pulmonary Disease

## 2016-06-09 NOTE — Progress Notes (Signed)
Subjective:    Patient ID: Andrew Wiggins, male    DOB: 01-Aug-1955, 61 y.o.   MRN: [id number]  HPI 61 y/o WM here for a follow up visit... 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 ~  SEE PREV EPIC NOTES FOR OLDER DATA >>     CXR 10/13 showed normal heart size, pacer on left, clear lungs, NAD.Andrew KitchenMarland Wiggins  EKG 10/13 showed NSR, rate85, LBBB, NAD...  LABS 10/13:  FLP- not at goals on diet alone;  Chems- wnl x BS=104;  CBC- wnl;  PSA= 0.54  Carotid Dopplers were ordered but never done...  Arterial Dopplers of LEs 11/13 were WNL- normal waveforms, no blockages...   ~  October 24, 2013:  62mo ROV & [name] is c/o some night sweats and "hot flashes" which he thinks is "the change"; still smoking 1/2 ppd but denies cardioresp symptoms; we discussed trial of "black cohosh" & noted he may need endocrine eval if routine studies are neg & symptom persists... We reviewed the following medical problems during today's office visit >>     COPD> on Mucinex OTC but he won't stay on inhalers, etc; still smoking 1/2ppd but "trying to quit"; he denies recent URI or resp exac; he declines smoking cessation help...    AbnCXR> w/ calcif granuloma right base; f/u CXR 6/15 w/ pacer on left, normal heart size, clear lungs, NAD...    Cig Smoker> still smoking ~1/2ppd, offered smoking cessation help/ counseling/ but he's not interested; I begged him to quit (again)!    HBP> on Lisin20; not on approp salt restriction (reminded); BP= 138/78, & he denies CP/ palpit/ SOB/ edema/ etc...    Cardiac> AVB, Pacer> followed by DrTaylor; EKG 10/13 shows NSR, rate85, LBBB...    Carotid Art dis> on ASA81; last CDoppler 2009 w/ severe plaque on right but no signif ICAstenoses=> needs f/u but he cancelled Dopplers in 2010 &  2013...    Hyperlipid> on diet alone;  FLP 6/15 showed TChol 173, TG 173, HDL 29, LDL 109... rec better low fat diet & incr exercise!    GERD> on Prilosec20 OTC as needed...    Headache> he uses Goodies as needed... We reviewed prob list, meds, xrays and labs> see below for updates >> he notes that OTC B12 tabs are giving him some energy...   CXR 6/15 showed normal heart size, clear lungs, NAD, pacer ok...  LABS 6/15:  FLP- ok x TG/LDL not at goals;  Chems- ok x BS=133;  CBC- wnl;  TSH=0.67;  PSA=0.55;  Sed=9...   ~  December 04, 2014:  62mo ROV & Andrew Wiggins returns for yearly ROV, states breathing is OK, not on meds, still smoking ~1/2ppd; he denies resp exac over the past yr- notes AM cough, min sput, no discoloration or hemoptysis; same SOB/DOE w/ exertion eg-walking but he notes legs bother him more than breathing... His CC is left shoulder pain, denies trauma, OTC analgesics help & we have rec trial Tramadol & refer to Ortho for XRays and further eval... We reviewed the following medical problems during today's office visit >>     COPD> on Mucinex OTC prn but he won't stay on inhalers, etc; still smoking 1/2ppd; he denies recent URI or resp exac; he declines smoking cessation help.Andrew KitchenMarland Wiggins  AbnCXR> w/ calcif granuloma right base; f/u CXR 7/16 w/ pacer on left, normal heart size, clear lungs, NAD...    Cig Smoker> still smoking ~1/2ppd, offered smoking cessation help/ counseling/ but he's not interested; I begged him to quit (again)!    HBP> on Lisin20; not on approp salt restriction (reminded); BP= 146/80, & he denies CP/ palpit/ SOB/ edema/ etc...    Cardiac> AVB, Pacer> followed by DrTaylor but last seen ?2011; EKG 10/13 shows NSR, rate85, LBBB; we will arrange for Cards f/u appt..    Carotid Art dis> on ASA81; last CDoppler 2009 w/ severe plaque on right but no signif ICAstenoses=> needs f/u but he cancelled Dopplers in 2010 & 2013...    Hyperlipid> on diet alone;  FLP 6/15 showed TChol 173, TG 173, HDL  29, LDL 109... rec better low fat diet & incr exercise!    GERD> on Prilosec20 OTC as needed...    Headache> he uses Goodies as needed... EXAM showed Afeb, VSS, O2sat=96% on RA;  Heent/neck- 1+right CBruit;  Chest- decr BS at bases, otherw clear;  Heart- RR, gr1/6 SEM w/o r/g;  Abd- sogt, neg;  Ext- neg w/o c/c/e... We reviewed prob list, meds, xrays and labs> see below for updates >>   CXR showed COPD changes, pacer ok, NAD...   Spirometry 12/04/14 was attempted (see graph) but predicted values not recorded on the test...  IMP/PLAN>>  Franciso has refused to quit smoking and won't take inhalers; he has cancelled several attempts at f/u CDopplers; he has not followed up w/ Cards in 5 yrs or had pacer checked; we will arrange for f/u appt w/ Cards => DrTaylor 12/10/14 w/ Carotids;  We will arrange for Ortho referral for left shoulder;  ROV w/ me in 28mo sooner if needed prn...  ADDENDUM>>  CDoppler 12/10/14 showed mixed plaque bilat, incr in right carotid stenosis, same on left;  REC- contin ASA81 daily, QUIT ALL SMOKING, low chol/ low fat diet... Repeat CDoppler 538yr...  ~  June 07, 2015:  62mo ROV & [name] is still smoking ~1/2ppd; c/o sore throat, bronchitic cough, small amt whitish sput w/o blood; he notes ?low grade fever, no c/s, and SOB/DOE w/o change (pt still not exercising);  He is supposed to be on [id number] but it is too expensive & he hasn't refilled this med (reminded that 1/2ppd costs in $2-3 per day, ~$75/mo & he could use this for his co-pay)... We will change Rx to Advair250Bid & he is asked to get a copy of his Insurance co prescription drug formulary... We reviewed the above problems list... EXAM showed Afeb, VSS- BP=150/80 recheck, O2sat=98% on RA;  Heent/neck- 1+right CBruit;  Chest- decr BS at bases, otherw clear;  Heart- RR, gr1/6 SEM w/o r/g;  Abd- sogt, neg;  Ext- neg w/o c/c/e... IMP/PLAN>>  Andrew Wiggins needs to quit all smoking before it is too late but he is clearly NOT motivated  to do so & declines smoking cessation help; we will Rx w/ ZPak for prn use & switch to.[id number]; OK 2016 Flu vaccine today...   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 We reviewed the following medical problems during today's office visit >>     COPD> on Mucinex OTC prn but he won't stay on inhalers ?due to cost; still smoking but <1/2ppd now; he denies recent URI or resp exac; he declines smoking cessation help...    AbnCXR> w/ calcif granuloma right base; f/u CXR 7/17 w/ pacer on left, normal heart size, clear lungs, NAD...    Cig Smoker> still smoking <1/2ppd now, offered smoking cessation help/ counseling/ but he's not interested; I begged him to quit (again)!    HBP> on Lisin20 (med refilled); not on approp salt restriction (reminded); BP= 118/74, & he denies CP/ palpit/ SOB/ edema/ etc...    Cardiac> AVB, Pacer> followed by DrTaylor but last seen 8/16 (chart reviewed by me)- Hx CHB, PPM insertion, HBP; device functioning normally, no change in meds...    Carotid Art dis> on ASA81; last CDoppler 2009 w/ severe plaque on right but no signif ICAstenoses=> needs f/u but he cancelled Dopplers in 2010 & 2013...    Hyperlipid> on diet alone;  FLP 6/15 showed TChol 173, TG 173, HDL 29, LDL 109... rec better low fat diet & incr exercise! He is once again NOT fasting for labs today...    NEW= DM> blood sugar today = 199 (prev 104-133); we reviewed need for low carb diet & incr exercise & no smoking=> rec to start METFORMIN-ER 500mg  Qam...    GERD> on Prilosec20 OTC as needed...    Headache> he uses  Goodies as needed... EXAM showed Afeb, VSS, O2sat=98% on RA;  Heent/neck- 1+right CBruit;  Chest- decr BS at bases, otherw clear;  Heart- RR, gr1/6 SEM w/o r/g;  Abd- sogt, neg;  Ext- neg w/o c/c/e; Neuro intact w/o focal abn x R-CBruit...  CXR 12/06/15 (independently reviewed by me in the PACS system) showed norm heart size, hyperinflated lungs c/w COPD but clear 7 NAD, pacemaker on left...  Spirometry 12/06/15 (independently interpreted by me) FVC=3.27 (71%), FEV1=2.32 (67%), %1sec=71, mid-flows reduced at 54% predicted;  This represents mild airflow obstruction & GOLD Stage2 COPD (can't r/o mild superimposed restriction)...  LABS 12/06/15>  Chems- ok x BS=199;  CBC- wnl;  TSH=1.33;  PSA=0.59... IMP/PLAN>>  Drayke has some underlying COPD & desperately needs to quit smoking; rec to start [id number] but unable to afford w/o insurance coverage- we discussed the obvious to quit smoking & save the money towards needed meds but he is not impressed; he will look into PartD coverage... we plan ROV in 61mo w/ labs and A1c...   ~  June 07, 2016:  60mo ROV & when Hitesh was here last we 1) begged him to quit smoking due to his mild COPD & his inability to afford inhalers; and 2) started Metformin500Qam for his DM w/ BS=199 in July2017; he was asked to f/u in 61mo but never made this appt and comes in today for 60mo ROV but he STOPPED the Metformin severasl months ago => he tells me that he went to [facility] ER in Oct2017 w/ diarrhea and LLQ pain, he says a scan showed pancreatitis, they stopped the Metformin, he was NOT admitted, placed on a liq diet & his symptoms resolved=> he did not see a gastroenterologist, did not call our office for follow up, and we never received any records from [facility]... we reviewed the following medical problems during today's office visit >>  COPD> on Mucinex OTC prn but he won't stay on inhalers ?due to cost; still smoking but ~1/2ppd now; he denies recent URI or resp  exac; he declines smoking cessation help...    AbnCXR> w/ calcif granuloma right base; f/u CXR 7/17 w/ pacer on left, normal heart size, clear lungs, NAD...    Cig Smoker> still smoking ~1/2ppd now, offered smoking cessation help/ counseling/ but he's not interested; I begged him to quit (again)!    HBP> on Lisin20 (med refilled); not on approp salt restriction (reminded); BP= 140/78, & he denies CP/ palpit/ SOB/ edema/ etc...    Cardiac> AVB, Pacer> followed by DrTaylor but last seen 8/16 (chart reviewed by me)- Hx CHB, PPM insertion, HBP; device functioning normally, no change in meds, asked to f/u 283yr...    Carotid Art dis> on ASA81; he cancelled CDopplers in 2010 & 2013; last CDoppler 12/2014 showed mixed plaque bilat, worsening right stenosis at 40-59%, similar left stenosis at 1-39%, & rec to take ASA, quit smoking, contol BP, Chol, BS to help prevent stroke...    Hyperlipid> on diet alone;  FLP 6/15 showed TChol 173, TG 173, HDL 29, LDL 109... rec better low fat diet & incr exercise! Repeat FLP 1/18 shows TChol 209, TG 246, HDL 30, LDL 110=> offered meds he declines wants diet alone    DM> blood sugar 7/17 = 199 (prev 104-133); we reviewed need for low carb diet & incr exercise & no smoking=>started on Metform500Qam, but he stopped 02/2016 w/ [facility] visit for diarrhea/ LLQ pain and said to have pancreatitis- he was not adm, we do not have any records, he was not seen by GI, and never called us for follow up here; LABS today 1/18 shows BS=279, A1c=10.9 & he is referred to Endocrine-DM for ASAP consultation...     GERD> on Prilosec20 OTC as needed...    Headache> he uses Goodies as needed... EXAM showed Afeb, VSS, O2sat=97% on RA;  Heent/neck- 1+right CBruit;  Chest- decr BS at bases, otherw clear;  Heart- RR, gr1/6 SEM w/o r/g;  Abd- sogt, neg;  Ext- neg w/o c/c/e; Neuro intact w/o focal abn x R-CBruit...  LABS 06/07/16>  FLP- not at goals on diet alone, refuses med rx;  Chems- ok x BS=279,  A1c=10.9 => needs Diabetic specialist (refer to DrEllison);   TSH=1.20;  CBC- wnl... IMP/PLAN>>  [name] is a walking time-bomb medically- smoker w/ COPD & abnCXR, known HBP, heart dis/pacer, HL, DM, and hx poor compliance w/ medical therapy & my recommendations for his care; he can't vs won't quit smoking, needs f/u w/ Cards-DrTaylor, needs statin added to regimen (declines), needs Endocrine-DM consult for on-going management of his DM => we will refer to [facility] endocrine ASAP...          PROBLEM LIST:    CHRONIC OBSTRUCTIVE PULMONARY DISEASE (ICD-496) - hx recurrent bronchitic episodes in the past... smokes 1/2 - 1ppd x yrs... he notes mild cough, occas sputum, denies SOB... He will not stay on inhaled Rx... ~  PFT's 11/08 showed FVC= 3.85 (79%), FEV1= 2.19 (55%), %1sec= 57, mid-flows= 32% predicted:  all c/w mod airflow obstruction...   Hx of ABNORMAL CHEST XRAY (ICD-793.1) - prev film w/ sm calcif granuloma right base, otherw neg...  ~  CXR 1/10 showed pacer placed, clear lungs, NAD.Andrew Wiggins.. ~  CXR 12/10 showed Pacer on left, clear lungs & mild hyperinflation, NAD.Andrew Wiggins.. ~  CXR 10/13 showed normal heart size, pacer on left, clear lungs, NAD.Andrew Wiggins.Andrew Wiggins. ~  CXR 6/15 showed normal heart size, clear lungs, NAD, pacer ok... ~  CXR 7/16 showed COPD changes, pacer ok, NAD...  CIGARETTE SMOKER (ICD-305.1) - 30+ yrs of >1ppd smoking... Now down to 1/2 ppd... we have discussed smoking cessation on numerous occas & reviewed options again today... He continues to decline smoking cessation help...  HYPERTENSION (ICD-401.9) - hx HBP on LISINOPRIL 20mg /d...  ~  2DEcho 11/09 showed sl incr LVwall thickness, norm LVF w/ EF= 55-60%, no regional wall motion abn, mild DD, mild MR... ~  3/12:  BP= 140/80 & denies fatigue, visual changes, CP, palipit, syncope, dyspnea, edema, etc... Changed to Lisinopril20. ~  10/13:  BP= 164/88 but he ran out of Lisin20 3d ago; he denies CP, palpit, dizzy, SOB, edema... ~  4/14:  on Lisin20; not on  approp salt restriction; BP= 130/90, & he denies CP/ palpit/ SOB/ edema/ etc. ~  6/15:  on Lisin20; not on approp salt restriction (reminded); BP= 138/78, & he denies CP/ palpit/ SOB/ edema/ etc... ~  7/16: on Lisin20; BP=146/80 & he remains asymptomatic, reminded to take med every day & elim salt...  CHEST WALL PAIN, HX OF (ICD-V15.89) ~  NuclearStressTest 12/09 = atypic Cp, LBBB, no perfusion defects, EF= 50% w/ abn septal motion.  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  ~  EKG 10/13 showed NSR, rate85, LBBB, NAD.  CAROTID ARTERY DISEASE >> on ASA 81mg /d... exam 11/09 shows gr 1-2 sys murmur LSB, but also has prominent bruit to base of right side of his neck w/ right > left Carotid Bruit... ~  CDopplers 11/09 showed severe plaque right ICA, & min plaque on left... no signif ICA stenoses per report. ~  f/u CDopplers 12/10 were ordered but pt never had these done... ~  f/u CDopplers 11/13 ==> pending (pt cancelled) ~  Pt has declined f/u CDopplers... ~  7/16: we arranged to f/u CDopplers and ROV w/ DrTaylor 12/10/14...  R/O PERIPHERAL ARTERIAL DISEASE >> presented 10/13 w/ leg pain on ambulation... ~  11/13:  ArtDopplers & ABIs ==> normal  HYPERLIPIDEMIA >>  ~  FLP 10/13 on diet alone showed TChol 194, TG 272, HDL 30, LDL 119... We reviewed low fat diet needed...  ~  FLP 6/15 on diet alone showed TChol 173, TG 173, HDL 29, LDL 109... Needs better low fat diet & incr exercise...  DIABETES MELLITUS >>  ~  Labs 11/2015 showed BS199 (prev 104-133) and he was started on Metformin500Qam; he did not return as requested to f/u efficacy... ~  He tells me he presented to [facility] ER [id number] w/ diarrhea & LLQ pain; told he had pancreatitis & says he was told to stop the Metformin; he was not admitted- did not consult w/ Endocrine/ GI/ etc- and we were never sent notification or records, and pt did not call our office for f/u visit! ~  Pt  returned 06/07/16 for ROV on diet alone & Labs showed BS=279, A1c=10.9, and he is being referred to Endocrine-DM ASAP for consultation and DM management...   GERD (ICD-530.81) - prev mild GERD symptoms that improved after cholecystectomy on 2000... uses OTC PPI as needed... ~  he is due for routine screening colonoscopy but he refuses to schedule this important screening procedure...  HEADACHE (ICD-784.0) - long hx of intermittent HA's and had neuro eval by DrStiefel in the 80's thought to be mixed muscle contraction & migraines... ~  He uses [employer]  powders as needed...  CONTACT DERMATITIS DUE TO POISON IVY (ICD-692.6)  Heath Maintenance:  OK Flu shot & Pneumovax 12/10...   Past Medical History:  Diagnosis Date  . Abnormal chest x-ray   . AV block    history of  . Cardiac pacemaker in situ   . Chest wall pain   . COPD (chronic obstructive pulmonary disease) (HCC)   . Dermatitis, contact    due to poison ivy  . GERD (gastroesophageal reflux disease)   . Headache(784.0)   . Hypertension   . Peripheral vascular disease (HCC)   . Tobacco use disorder     Past Surgical History:  Procedure Laterality Date  . laproscopic cholecystectomy  2000   by Dr. Odie Sera  . PACEMAKER INSERTION  03/2008   by Dr. Ladona Ridgel    Outpatient Encounter Prescriptions as of 06/07/2016  Medication Sig Dispense Refill  . aspirin 81 MG tablet Take 81 mg by mouth daily.      . Aspirin-Acetaminophen-Caffeine (GOODY HEADACHE PO) Take 1 packet by mouth daily as needed for headaches    . cholecalciferol (VITAMIN D) 1000 units tablet Take 1,000 Units by mouth daily.    Andrew Wiggins guaiFENesin (MUCINEX) 600 MG 12 hr tablet Take 600 mg by mouth 2 (two) times daily.    Andrew Wiggins lisinopril (PRINIVIL,ZESTRIL) 20 MG tablet Take 1 tablet (20 mg total) by mouth daily. 90 tablet 1  . [DISCONTINUED] lisinopril (PRINIVIL,ZESTRIL) 20 MG tablet TAKE ONE TABLET BY MOUTH ONCE DAILY 90 tablet 1  . Fluticasone-Salmeterol (ADVAIR) 250-50  MCG/DOSE AEPB Inhale 1 puff into the lungs every 12 (twelve) hours. (Patient not taking: Reported on 06/07/2016) 60 each 11  . metFORMIN (GLUCOPHAGE-XR) 500 MG 24 hr tablet Take 1 tablet (500 mg total) by mouth daily with breakfast. (Patient not taking: Reported on 06/07/2016) 90 tablet 3  . vitamin B-12 (CYANOCOBALAMIN) 1000 MCG tablet Take 1,000 mcg by mouth daily.     No facility-administered encounter medications on file as of 06/07/2016.     No Known Allergies   Current Medications, Allergies, Past Medical History, Past Surgical History, Family History, and Social History were reviewed in [employer] record.   Review of Systems    Constitutional:  Denies F/C/S, anorexia, unexpected weight change. He is c/o night sweats. HEENT:  No HA, visual changes, earache, nasal symptoms, sore throat, hoarseness. Resp:  min cough & sputum; no hemoptysis; no SOB, tightness, wheezing. Cardio:  No CP, palpit, DOE, orthopnea, edema. GI:  Denies N/V/D/C or blood in stool; no reflux, abd pain, distention, or gas. GU:  No dysuria, freq, urgency, hematuria, or flank pain. MS:  Denies joint pain, swelling, tenderness, or decr ROM; no neck pain, back pain, etc. Neuro:  No tremors, seizures, dizziness, syncope, weakness, numbness, gait abn. Skin:  No suspicious lesions or skin rash. Heme:  No adenopathy, bruising, bleeding. Psyche: Denies confusion, sleep disturbance, hallucinations, anxiety, depression.    Objective:   Physical Exam   WD, WN, 61 y/o WM in NAD... Vital Signs:  Reviewed... BP recheck 142/ 80... General:  Alert & oriented; pleasant & cooperative... HEENT:  [place]/AT, EOM-wnl, PERRLA, Fundi-benign, EACs-clear, TMs-wnl, NOSE-clear, THROAT-clear & wnl.  Neck:  Supple w/ full ROM; no JVD; normal carotid impulses & faint right neck bruit; no thyromegaly or nodules palpated; no lymphadenopathy.  Chest:  Clear to P & A; without wheezes/ rales/ or rhonchi heard... Heart:   Regular Rhythm; pacer palpated in left upper chest wall, norm S1 & S2 without murmurs/  rubs/ or gallops detected... Abdomen:  Soft & nontender; normal bowel sounds; no organomegaly or masses palpated... Ext:  Normal ROM; without deformities or arthritic changes; no varicose veins, venous insuffic, or edema... Derm:  No lesions noted; no rash etc... Scar on bridge of nose from prev skin ca... Lymph:  No cervical, supraclavicular, axillary, or inguinal adenopathy palpated...    Assessment & Plan:    06/07/16>   Andrew Wiggins is a walking time-bomb medically- smoker w/ COPD & abnCXR, known HBP, heart dis/pacer, HL, DM, and hx poor compliance w/ medical therapy & my recommendations for his care; he can't vs won't quit smoking, needs f/u w/ Cards-DrTaylor, needs statin added to regimen (declines), needs Endocrine-DM consult for on-going management of his DM => we will refer to [facility] endocrine ASAP.   Pulm> COPD, AbnCXR, CigSmoker> CXRs w/o acute changes; must quit all smoking but he is not inclined toward counseling etc...  HBP>  BP is controlled on Lisin20- reminded to take it every day!  Cardiac> LBBB, Pacer>  Followed by DrTaylor & due for f/u visit & pacer check...  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  HYPERLIPID>  FLP not controlled on diet alone, esp w/ mult coronary risk factors; I have advised STATIN therapy but he declines, wants diet alone...  NEW => DM w/ FBS 11/2015 = 199 & we reviewed diet, exercise, start METFORMIN-ER 500mg  Qam=> this was stopped 02/2016 by [facility] & pt never followed up w/ me... ~  Pt returned 06/07/16 for ROV on diet alone & Labs showed BS=279, A1c=10.9, and he is being referred to Endocrine-DM ASAP for consultation and DM management...   PAD>  Presented 10/13 w/ LE pain on ambulation; Art dopplers ==> wnl...  GERD> min symptoms and uses OTC Prilosec as needed...  Need for baseline  Colonoscopy>  He hasn't yet done his baseline colonoscopy & reminded again...  Headaches>  Uses Goodies as needed...   Patient's Medications  New Prescriptions   No medications on file  Previous Medications   ASPIRIN 81 MG TABLET    Take 81 mg by mouth daily.     ASPIRIN-ACETAMINOPHEN-CAFFEINE (GOODY HEADACHE PO)    Take 1 packet by mouth daily as needed for headaches   CHOLECALCIFEROL (VITAMIN D) 1000 UNITS TABLET    Take 1,000 Units by mouth daily.   FLUTICASONE-SALMETEROL (ADVAIR) 250-50 MCG/DOSE AEPB    Inhale 1 puff into the lungs every 12 (twelve) hours.   GUAIFENESIN (MUCINEX) 600 MG 12 HR TABLET    Take 600 mg by mouth 2 (two) times daily.   METFORMIN (GLUCOPHAGE-XR) 500 MG 24 HR TABLET    Take 1 tablet (500 mg total) by mouth daily with breakfast.   VITAMIN B-12 (CYANOCOBALAMIN) 1000 MCG TABLET    Take 1,000 mcg by mouth daily.  Modified Medications   Modified Medication Previous Medication   LISINOPRIL (PRINIVIL,ZESTRIL) 20 MG TABLET lisinopril (PRINIVIL,ZESTRIL) 20 MG tablet      Take 1 tablet (20 mg total) by mouth daily.    TAKE ONE TABLET BY MOUTH ONCE DAILY  Discontinued Medications   No medications on file

## 2016-06-12 ENCOUNTER — Other Ambulatory Visit: Payer: Self-pay | Admitting: Pulmonary Disease

## 2016-06-12 DIAGNOSIS — E118 Type 2 diabetes mellitus with unspecified complications: Secondary | ICD-10-CM

## 2016-07-07 ENCOUNTER — Ambulatory Visit: Payer: Medicare Other | Admitting: Endocrinology

## 2016-12-05 ENCOUNTER — Ambulatory Visit: Payer: Medicare Other | Admitting: Pulmonary Disease

## 2016-12-07 ENCOUNTER — Ambulatory Visit: Payer: Medicare Other | Admitting: Pulmonary Disease

## 2016-12-20 ENCOUNTER — Ambulatory Visit (INDEPENDENT_AMBULATORY_CARE_PROVIDER_SITE_OTHER)
Admission: RE | Admit: 2016-12-20 | Discharge: 2016-12-20 | Disposition: A | Discharge status: Home / Self Care | Payer: PPO | Source: Ambulatory Visit | Attending: Pulmonary Disease | Admitting: Pulmonary Disease

## 2016-12-20 ENCOUNTER — Other Ambulatory Visit (INDEPENDENT_AMBULATORY_CARE_PROVIDER_SITE_OTHER): Payer: PPO

## 2016-12-20 ENCOUNTER — Encounter: Payer: Self-pay | Admitting: Pulmonary Disease

## 2016-12-20 ENCOUNTER — Ambulatory Visit (INDEPENDENT_AMBULATORY_CARE_PROVIDER_SITE_OTHER): Payer: PPO | Admitting: Pulmonary Disease

## 2016-12-20 VITALS — BP 150/80 | HR 84 | Temp 98.2°F | Ht 69.0 in | Wt 144.1 lb

## 2016-12-20 DIAGNOSIS — J449 Chronic obstructive pulmonary disease, unspecified: Secondary | ICD-10-CM | POA: Diagnosis not present

## 2016-12-20 DIAGNOSIS — K219 Gastro-esophageal reflux disease without esophagitis: Secondary | ICD-10-CM

## 2016-12-20 DIAGNOSIS — E785 Hyperlipidemia, unspecified: Secondary | ICD-10-CM | POA: Diagnosis not present

## 2016-12-20 DIAGNOSIS — R7309 Other abnormal glucose: Secondary | ICD-10-CM

## 2016-12-20 DIAGNOSIS — E0840 Diabetes mellitus due to underlying condition with diabetic neuropathy, unspecified: Secondary | ICD-10-CM | POA: Diagnosis not present

## 2016-12-20 DIAGNOSIS — I10 Essential (primary) hypertension: Secondary | ICD-10-CM

## 2016-12-20 DIAGNOSIS — E1140 Type 2 diabetes mellitus with diabetic neuropathy, unspecified: Secondary | ICD-10-CM | POA: Insufficient documentation

## 2016-12-20 DIAGNOSIS — Z950 Presence of cardiac pacemaker: Secondary | ICD-10-CM | POA: Diagnosis not present

## 2016-12-20 DIAGNOSIS — I779 Disorder of arteries and arterioles, unspecified: Secondary | ICD-10-CM | POA: Diagnosis not present

## 2016-12-20 DIAGNOSIS — I459 Conduction disorder, unspecified: Secondary | ICD-10-CM

## 2016-12-20 DIAGNOSIS — I739 Peripheral vascular disease, unspecified: Secondary | ICD-10-CM

## 2016-12-20 DIAGNOSIS — F17200 Nicotine dependence, unspecified, uncomplicated: Secondary | ICD-10-CM

## 2016-12-20 HISTORY — DX: Type 2 diabetes mellitus with diabetic neuropathy, unspecified: E11.40

## 2016-12-20 LAB — BASIC METABOLIC PANEL
BUN: 11 mg/dL (ref 6–23)
CALCIUM: 9.3 mg/dL (ref 8.4–10.5)
CO2: 30 mEq/L (ref 19–32)
CREATININE: 0.78 mg/dL (ref 0.40–1.50)
Chloride: 102 mEq/L (ref 96–112)
GFR: 107.2 mL/min (ref >60.00)
Glucose, Bld: 232 mg/dL — ABNORMAL HIGH (ref 70–99)
Potassium: 4.1 mEq/L (ref 3.5–5.1)
Sodium: 138 mEq/L (ref 135–145)

## 2016-12-20 LAB — HEMOGLOBIN A1C: Hgb A1c MFr Bld: 9.3 % — ABNORMAL HIGH (ref 4.6–6.5)

## 2016-12-20 NOTE — Progress Notes (Signed)
Subjective:    Patient ID: Andrew Wiggins, male    DOB: 1956-02-19, 61 y.o.   MRN: [id number]  HPI 62 y/o WM here for a follow up visit... 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 ~  SEE PREV EPIC NOTES FOR OLDER DATA >>     CXR 10/13 showed normal heart size, pacer on left, clear lungs, NAD.Marland KitchenMarland Kitchen  EKG 10/13 showed NSR, rate85, LBBB, NAD...  LABS 10/13:  FLP- not at goals on diet alone;  Chems- wnl x BS=104;  CBC- wnl;  PSA= 0.54  Carotid Dopplers were ordered but never done...  Arterial Dopplers of LEs 11/13 were WNL- normal waveforms, no blockages...   ~  October 24, 2013:  63mo ROV & Andrew Wiggins is c/o some night sweats and "hot flashes" which he thinks is "the change"; still smoking 1/2 ppd but denies cardioresp symptoms; we discussed trial of "black cohosh" & noted he may need endocrine eval if routine studies are neg & symptom persists... We reviewed the following medical problems during today's office visit >>     COPD> on Mucinex OTC but he won't stay on inhalers, etc; still smoking 1/2ppd but "trying to quit"; he denies recent URI or resp exac; he declines smoking cessation help...    AbnCXR> w/ calcif granuloma right base; f/u CXR 6/15 w/ pacer on left, normal heart size, clear lungs, NAD...    Cig Smoker> still smoking ~1/2ppd, offered smoking cessation help/ counseling/ but he's not interested; I begged him to quit (again)!    HBP> on Lisin20; not on approp salt restriction (reminded); BP= 138/78, & he denies CP/ palpit/ SOB/ edema/ etc...    Cardiac> AVB, Pacer> followed by DrTaylor; EKG 10/13 shows NSR, rate85, LBBB...    Carotid Art dis> on ASA81; last CDoppler 2009 w/ severe plaque on right but no signif ICAstenoses=> needs f/u but he cancelled Dopplers in 2010 &  2013...    Hyperlipid> on diet alone;  FLP 6/15 showed TChol 173, TG 173, HDL 29, LDL 109... rec better low fat diet & incr exercise!    GERD> on Prilosec20 OTC as needed...    Headache> he uses Goodies as needed... We reviewed prob list, meds, xrays and labs> see below for updates >> he notes that OTC B12 tabs are giving him some energy...   CXR 6/15 showed normal heart size, clear lungs, NAD, pacer ok...  LABS 6/15:  FLP- ok x TG/LDL not at goals;  Chems- ok x BS=133;  CBC- wnl;  TSH=0.67;  PSA=0.55;  Sed=9...   ~  December 04, 2014:  60mo ROV & Andrew Wiggins returns for yearly ROV, states breathing is OK, not on meds, still smoking ~1/2ppd; he denies resp exac over the past yr- notes AM cough, min sput, no discoloration or hemoptysis; same SOB/DOE w/ exertion eg-walking but he notes legs bother him more than breathing... His CC is left shoulder pain, denies trauma, OTC analgesics help & we have rec trial Tramadol & refer to Ortho for XRays and further eval... We reviewed the following medical problems during today's office visit >>     COPD> on Mucinex OTC prn but he won't stay on inhalers, etc; still smoking 1/2ppd; he denies recent URI or resp exac; he declines smoking cessation help.Marland KitchenMarland Kitchen  AbnCXR> w/ calcif granuloma right base; f/u CXR 7/16 w/ pacer on left, normal heart size, clear lungs, NAD...    Cig Smoker> still smoking ~1/2ppd, offered smoking cessation help/ counseling/ but he's not interested; I begged him to quit (again)!    HBP> on Lisin20; not on approp salt restriction (reminded); BP= 146/80, & he denies CP/ palpit/ SOB/ edema/ etc...    Cardiac> AVB, Pacer> followed by DrTaylor but last seen ?2011; EKG 10/13 shows NSR, rate85, LBBB; we will arrange for Cards f/u appt..    Carotid Art dis> on ASA81; last CDoppler 2009 w/ severe plaque on right but no signif ICAstenoses=> needs f/u but he cancelled Dopplers in 2010 & 2013...    Hyperlipid> on diet alone;  FLP 6/15 showed TChol 173, TG 173, HDL  29, LDL 109... rec better low fat diet & incr exercise!    GERD> on Prilosec20 OTC as needed...    Headache> he uses Goodies as needed... EXAM showed Afeb, VSS, O2sat=96% on RA;  Heent/neck- 1+right CBruit;  Chest- decr BS at bases, otherw clear;  Heart- RR, gr1/6 SEM w/o r/g;  Abd- sogt, neg;  Ext- neg w/o c/c/e... We reviewed prob list, meds, xrays and labs> see below for updates >>   CXR showed COPD changes, pacer ok, NAD...   Spirometry 12/04/14 was attempted (see graph) but predicted values not recorded on the test...  IMP/PLAN>>  Franciso has refused to quit smoking and won't take inhalers; he has cancelled several attempts at f/u CDopplers; he has not followed up w/ Cards in 5 yrs or had pacer checked; we will arrange for f/u appt w/ Cards => DrTaylor 12/10/14 w/ Carotids;  We will arrange for Ortho referral for left shoulder;  ROV w/ me in 28mo sooner if needed prn...  ADDENDUM>>  CDoppler 12/10/14 showed mixed plaque bilat, incr in right carotid stenosis, same on left;  REC- contin ASA81 daily, QUIT ALL SMOKING, low chol/ low fat diet... Repeat CDoppler 538yr...  ~  June 07, 2015:  28mo ROV & Andrew Wiggins is still smoking ~1/2ppd; c/o sore throat, bronchitic cough, small amt whitish sput w/o blood; he notes ?low grade fever, no c/s, and SOB/DOE w/o change (pt still not exercising);  He is supposed to be on [id number] but it is too expensive & he hasn't refilled this med (reminded that 1/2ppd costs in $2-3 per day, ~$75/mo & he could use this for his co-pay)... We will change Rx to Advair250Bid & he is asked to get a copy of his Insurance co prescription drug formulary... We reviewed the above problems list... EXAM showed Afeb, VSS- BP=150/80 recheck, O2sat=98% on RA;  Heent/neck- 1+right CBruit;  Chest- decr BS at bases, otherw clear;  Heart- RR, gr1/6 SEM w/o r/g;  Abd- sogt, neg;  Ext- neg w/o c/c/e... IMP/PLAN>>  Andrew Wiggins needs to quit all smoking before it is too late but he is clearly NOT motivated  to do so & declines smoking cessation help; we will Rx w/ ZPak for prn use & switch to.[id number]; OK 2016 Flu vaccine today...   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 We reviewed the following medical problems during today's office visit >>     COPD> on Mucinex OTC prn but he won't stay on inhalers ?due to cost; still smoking but <1/2ppd now; he denies recent URI or resp exac; he declines smoking cessation help...    AbnCXR> w/ calcif granuloma right base; f/u CXR 7/17 w/ pacer on left, normal heart size, clear lungs, NAD...    Cig Smoker> still smoking <1/2ppd now, offered smoking cessation help/ counseling/ but he's not interested; I begged him to quit (again)!    HBP> on Lisin20 (med refilled); not on approp salt restriction (reminded); BP= 118/74, & he denies CP/ palpit/ SOB/ edema/ etc...    Cardiac> AVB, Pacer> followed by DrTaylor but last seen 8/16 (chart reviewed by me)- Hx CHB, PPM insertion, HBP; device functioning normally, no change in meds...    Carotid Art dis> on ASA81; last CDoppler 2009 w/ severe plaque on right but no signif ICAstenoses=> needs f/u but he cancelled Dopplers in 2010 & 2013...    Hyperlipid> on diet alone;  FLP 6/15 showed TChol 173, TG 173, HDL 29, LDL 109... rec better low fat diet & incr exercise! He is once again NOT fasting for labs today...    NEW= DM> blood sugar today = 199 (prev 104-133); we reviewed need for low carb diet & incr exercise & no smoking=> rec to start METFORMIN-ER 500mg  Qam...    GERD> on Prilosec20 OTC as needed...    Headache> he uses  Goodies as needed... EXAM showed Afeb, VSS, O2sat=98% on RA;  Heent/neck- 1+right CBruit;  Chest- decr BS at bases, otherw clear;  Heart- RR, gr1/6 SEM w/o r/g;  Abd- sogt, neg;  Ext- neg w/o c/c/e; Neuro intact w/o focal abn x R-CBruit...  CXR 12/06/15 (independently reviewed by me in the PACS system) showed norm heart size, hyperinflated lungs c/w COPD but clear 7 NAD, pacemaker on left...  Spirometry 12/06/15 (independently interpreted by me) FVC=3.27 (71%), FEV1=2.32 (67%), %1sec=71, mid-flows reduced at 54% predicted;  This represents mild airflow obstruction & GOLD Stage2 COPD (can't r/o mild superimposed restriction)...  LABS 12/06/15>  Chems- ok x BS=199;  CBC- wnl;  TSH=1.33;  PSA=0.59... IMP/PLAN>>  Kamarion has some underlying COPD & desperately needs to quit smoking; rec to start [id number] but unable to afford w/o insurance coverage- we discussed the obvious to quit smoking & save the money towards needed meds but he is not impressed; he will look into PartD coverage... we plan ROV in 878mo w/ labs and A1c...  ~  June 07, 2016:  108mo ROV & when Andrew Wiggins was here last we 1) begged him to quit smoking due to his mild COPD & his inability to afford inhalers; and 2) started Metformin500Qam for his DM w/ BS=199 in July2017; he was asked to f/u in 878mo but never made this appt and comes in today for 108mo ROV but he STOPPED the Metformin severasl months ago => he tells me that [facility] w/ diarrhea and LLQ pain, he says a scan showed pancreatitis, they stopped the Metformin, he was NOT admitted, placed on a liq diet & his symptoms resolved=> he did not see a gastroenterologist, did not call our office for follow up, and we never received any records from [facility]... we reviewed the following medical problems during today's office visit >>     COPD>  on Mucinex OTC prn but he won't stay on inhalers ?due to cost; still smoking but ~1/2ppd now; he denies recent URI or resp  exac; he declines smoking cessation help...    AbnCXR> w/ calcif granuloma right base; f/u CXR 7/17 w/ pacer on left, normal heart size, clear lungs, NAD...    Cig Smoker> still smoking ~1/2ppd now, offered smoking cessation help/ counseling/ but he's not interested; I begged him to quit (again)!    HBP> on Lisin20 (med refilled); not on approp salt restriction (reminded); BP= 140/78, & he denies CP/ palpit/ SOB/ edema/ etc...    Cardiac> AVB, Pacer> followed by DrTaylor but last seen 8/16 (chart reviewed by me)- Hx CHB, PPM insertion, HBP; device functioning normally, no change in meds, asked to f/u 10yr...    Carotid Art dis> on ASA81; he cancelled CDopplers in 2010 & 2013; last CDoppler 12/2014 showed mixed plaque bilat, worsening right stenosis at 40-59%, similar left stenosis at 1-39%, & rec to take ASA, quit smoking, contol BP, Chol, BS to help prevent stroke...    Hyperlipid> on diet alone;  FLP 6/15 showed TChol 173, TG 173, HDL 29, LDL 109... rec better low fat diet & incr exercise! Repeat FLP 1/18 shows TChol 209, TG 246, HDL 30, LDL 110=> offered meds he declines wants diet alone    DM> blood sugar 7/17 = 199 (prev 104-133); we reviewed need for low carb diet & incr exercise & no smoking=>started on Metform500Qam, but he stopped 02/2016 w/ [facility] ER visit for diarrhea/ LLQ pain and said to have pancreatitis- he was not adm, we do not have any records, he was not seen by GI, and never called [place] for follow up here; LABS today 1/18 shows BS=279, A1c=10.9 & he is referred to Endocrine-DM for ASAP consultation...     GERD> on Prilosec20 OTC as needed...    Headache> he uses Goodies as needed... EXAM showed Afeb, VSS, O2sat=97% on RA;  Heent/neck- 1+right CBruit;  Chest- decr BS at bases, otherw clear;  Heart- RR, gr1/6 SEM w/o r/g;  Abd- sogt, neg;  Ext- neg w/o c/c/e; Neuro intact w/o focal abn x R-CBruit...  LABS 06/07/16>  FLP- not at goals on diet alone, refuses med rx;  Chems- ok x BS=279,  A1c=10.9 => needs Diabetic specialist (refer to DrEllison);   TSH=1.20;  CBC- wnl... IMP/PLAN>>  Andrew Wiggins is a walking time-bomb medically- smoker w/ COPD & abnCXR, known HBP, heart dis/pacer, HL, DM, and hx poor compliance w/ medical therapy & my recommendations for his care; he can't vs won't quit smoking, needs f/u w/ Cards-DrTaylor, needs statin added to regimen (declines), needs Endocrine-DM consult for on-going management of his DM => we will refer to [facility] ASAP...   ~  December 20, 2016:  6-25mo ROV & at our last visit Andrew Wiggins's BS=279, A1c=10.9 & he was referred to [facility]/ DM but he never went for the appt- says it was due to a death in the family & never called to reschedule;  He is now c/o neuropathic symptoms in his legs, esp Qhs, & we reviewed the importance of tight DM control, need for DM consult & management, consideration of Gabapentin medication, etc;  Also reminded of the CV risk & need for f/u by CARDS (prev getting phone monitoring by DrTaylor but pt stopped this on his own...  We reviewed the following medical problems during today's office visit >>     COPD, GOLD 2> on Mucinex OTC prn but he  won't stay on inhalers ?due to cost; still smoking but ~1/2ppd now; he denies recent URI or resp exac; he declines smoking cessation help, refuses regular inhaler meds...    AbnCXR> w/ calcif granuloma right base; f/u CXR 8/18 w/ pacer on left, normal heart size, clear lungs, NAD...    Cig Smoker> still smoking ~1/2ppd now, offered smoking cessation help/ counseling/ but he's not interested; I begged him to quit (again)!    HBP> on Lisin20 (med refilled); not on approp salt restriction (reminded); BP= 160/80, & he denies CP/ palpit/ SOB/ edema/ etc; reminded to take med daily, elim sodium, monitor BP at home & call for issues...    Cardiac> AVB, Pacer> followed by DrTaylor but last seen 8/16 (chart reviewed by me)- Hx CHB, PPM insertion, HBP; device functioning normally, no change in meds,  asked to f/u 54yr...    Carotid Art dis> on ASA81; he cancelled CDopplers in 2010 & 2013; last CDoppler 12/2014 showed mixed plaque bilat, worsening right stenosis at 40-59%, similar left stenosis at 1-39%, & rec to take ASA, quit smoking, contol BP, Chol, BS to help prevent stroke...    Hyperlipid> on diet alone;  FLP 1/18 showed TChol 209, TG 246, HDL 30, LDL 110... rec better low fat diet & increase exercise, offered meds he declines wants diet alone...    DM> blood sugar 7/17 = 199 (prev 104-133); we reviewed need for low carb diet & incr exercise & no smoking=>started on Metform500Qam, but he stopped 02/2016 w/ [facility] ER visit for diarrhea/ LLQ pain and said to have pancreatitis- he was not adm, we do not have any records, he was not seen by GI, and never called [place] for follow up here; LABS 1/18 shows BS=279, A1c=10.9 & he is referred to Endocrine-DM but he cancelled & never resched...    GERD> on Prilosec20 OTC as needed...    Headache> he uses Goodies as needed... EXAM showed Afeb, VSS, O2sat=96% on RA;  Heent/neck- 1+right CBruit;  Chest- decr BS at bases, otherw clear;  Heart- RR, gr1/6 SEM w/o r/g;  Abd- soft, neg;  Ext- neg w/o c/c/e; Neuro intact w/o focal abn...  CXR 12/20/16 (independently reviewed by me in the PACS system) showed norm heart size & vascularity, pacer unchanged, lungs clear w/ sl hyperinflation- NAD...  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           PROBLEM LIST:    CHRONIC OBSTRUCTIVE PULMONARY DISEASE (ICD-496) - hx recurrent bronchitic episodes in the past... smokes 1/2 - 1ppd x yrs... he notes mild cough, occas sputum, denies SOB... He will not stay on inhaled Rx... ~  PFT's 11/08 showed FVC= 3.85 (79%), FEV1= 2.19 (55%), %  1sec= 57, mid-flows= 32% predicted:  all c/w mod airflow obstruction...   Hx of ABNORMAL CHEST XRAY (ICD-793.1) - prev film w/ sm calcif granuloma right base, otherw neg...  ~  CXR 1/10 showed pacer placed, clear lungs, NAD.[name]. ~  CXR 12/10 showed Pacer on left, clear lungs & mild hyperinflation, NAD.[name]. ~  CXR 10/13 showed normal heart size, pacer on left, clear lungs, NAD.[name]. ~  CXR 6/15 showed normal heart size, clear lungs, NAD, pacer ok... ~  CXR 7/16 showed COPD changes, pacer ok, NAD...  CIGARETTE SMOKER (ICD-305.1) - 30+ yrs of >1ppd smoking... Now down to 1/2 ppd... we have discussed smoking cessation on numerous occas & reviewed options again today... He continues to decline smoking cessation help...  HYPERTENSION (ICD-401.9) - hx HBP on LISINOPRIL 20mg /d...  ~  2DEcho 11/09 showed sl incr LVwall thickness, norm LVF w/ EF= 55-60%, no regional wall motion abn, mild DD, mild MR... ~  3/12:  BP= 140/80 & denies fatigue, visual changes, CP, palipit, syncope, dyspnea, edema, etc... Changed to Lisinopril20. ~  10/13:  BP= 164/88 but he ran out of Lisin20 3d ago; he denies CP, palpit, dizzy, SOB, edema... ~  4/14:  on Lisin20; not on approp salt restriction; BP= 130/90, & he denies CP/ palpit/ SOB/ edema/ etc. ~  6/15:  on Lisin20; not on approp salt restriction (reminded); BP= 138/78, & he denies CP/ palpit/ SOB/ edema/ etc... ~  7/16: on Lisin20; BP=146/80 & he remains asymptomatic, reminded to take med every  day & elim salt...  CHEST WALL PAIN, HX OF (ICD-V15.89) ~  NuclearStressTest 12/09 = atypic Cp, LBBB, no perfusion defects, EF= 50% w/ abn septal motion.  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  ~  EKG 10/13 showed NSR, rate85, LBBB, NAD.  CAROTID ARTERY DISEASE >> on ASA 81mg /d... exam 11/09 shows gr 1-2 sys murmur LSB, but also has prominent bruit to base of right side of his neck w/ right > left Carotid Bruit... ~  CDopplers 11/09 showed severe plaque right ICA, & min plaque on left... no signif ICA stenoses per report. ~  f/u CDopplers 12/10 were ordered but pt never had these done... ~  f/u CDopplers 11/13 ==> pending (pt cancelled) ~  Pt has declined f/u CDopplers... ~  7/16: we arranged to f/u CDopplers and ROV w/ DrTaylor 12/10/14...  R/O PERIPHERAL ARTERIAL DISEASE >> presented 10/13 w/ leg pain on ambulation... ~  11/13:  ArtDopplers & ABIs ==> normal  HYPERLIPIDEMIA >>  ~  FLP 10/13 on diet alone showed TChol 194, TG 272, HDL 30, LDL 119... We reviewed low fat diet needed...  ~  FLP 6/15 on diet alone showed TChol 173, TG 173, HDL 29, LDL 109... Needs better low fat diet & incr exercise...  DIABETES MELLITUS >>  ~  Labs 11/2015 showed BS199 (prev 104-133) and he was started on Metformin500Qam; he did not return as requested to f/u efficacy... ~  He tells me he presented to [facility] ER [id number] w/ diarrhea & LLQ pain; told he had pancreatitis & says he was told to stop the Metformin; he was not admitted- did not consult w/ Endocrine/ GI/ etc- and we were never sent notification or records, and pt did not call our office for f/u visit! ~  Pt returned 06/07/16 for ROV on diet alone & Labs showed BS=279, A1c=10.9, and he is being referred to Endocrine-DM ASAP for consultation and DM  management...   GERD (ICD-530.81) - prev mild GERD symptoms that improved after cholecystectomy on 2000... uses OTC PPI as needed... ~  he is  due for routine screening colonoscopy but he refuses to schedule this important screening procedure...  HEADACHE (ICD-784.0) - long hx of intermittent HA's and had neuro eval by DrStiefel in the 80's thought to be mixed muscle contraction & migraines... ~  He uses Goodie's powders as needed...  CONTACT DERMATITIS DUE TO POISON IVY (ICD-692.6)  Heath Maintenance:  OK Flu shot & Pneumovax 12/10...   Past Medical History:  Diagnosis Date  . Abnormal chest x-ray   . AV block    history of  . Cardiac pacemaker in situ   . Chest wall pain   . COPD (chronic obstructive pulmonary disease) (HCC)   . Dermatitis, contact    due to poison ivy  . GERD (gastroesophageal reflux disease)   . Headache(784.0)   . Hypertension   . Peripheral vascular disease (HCC)   . Tobacco use disorder     Past Surgical History:  Procedure Laterality Date  . laproscopic cholecystectomy  2000   by Dr. Odie Sera  . PACEMAKER INSERTION  03/2008   by Dr. Ladona Ridgel    Outpatient Encounter Prescriptions as of 12/20/2016  Medication Sig  . aspirin 81 MG tablet Take 81 mg by mouth daily.    . Aspirin-Acetaminophen-Caffeine (GOODY HEADACHE PO) Take 1 packet by mouth daily as needed for headaches  . cholecalciferol (VITAMIN D) 1000 units tablet Take 1,000 Units by mouth daily.  [name] guaiFENesin (MUCINEX) 600 MG 12 hr tablet Take 600 mg by mouth 2 (two) times daily.  [name] lisinopril (PRINIVIL,ZESTRIL) 20 MG tablet Take 1 tablet (20 mg total) by mouth daily.  . vitamin B-12 (CYANOCOBALAMIN) 1000 MCG tablet Take 1,000 mcg by mouth daily.  . [DISCONTINUED] Fluticasone-Salmeterol (ADVAIR) 250-50 MCG/DOSE AEPB Inhale 1 puff into the lungs every 12 (twelve) hours. (Patient not taking: Reported on 06/07/2016)  . [DISCONTINUED] metFORMIN (GLUCOPHAGE-XR) 500 MG 24 hr tablet Take 1 tablet (500 mg total) by mouth daily with breakfast. (Patient not taking: Reported on 06/07/2016)   No facility-administered encounter medications on file  as of 12/20/2016.     No Known Allergies   Immunization History  Administered Date(s) Administered  . Influenza Whole 04/15/2009  . Influenza,inj,Quad PF,36+ Mos 06/07/2015, 06/07/2016  . Pneumococcal Polysaccharide-23 04/15/2009    Current Medications, Allergies, Past Medical History, Past Surgical History, Family History, and Social History were reviewed in [employer] record.   Review of Systems    Constitutional:  Denies F/C/S, anorexia, unexpected weight change. He is c/o night sweats. HEENT:  No HA, visual changes, earache, nasal symptoms, sore throat, hoarseness. Resp:  min cough & sputum; no hemoptysis; no SOB, tightness, wheezing. Cardio:  No CP, palpit, DOE, orthopnea, edema. GI:  Denies N/V/D/C or blood in stool; no reflux, abd pain, distention, or gas. GU:  No dysuria, freq, urgency, hematuria, or flank pain. MS:  Denies joint pain, swelling, tenderness, or decr ROM; no neck pain, back pain, etc. Neuro:  No tremors, seizures, dizziness, syncope, weakness, numbness, gait abn. Skin:  No suspicious lesions or skin rash. Heme:  No adenopathy, bruising, bleeding. Psyche: Denies confusion, sleep disturbance, hallucinations, anxiety, depression.    Objective:   Physical Exam   WD, WN, 61 y/o WM in NAD... Vital Signs:  Reviewed... BP recheck 142/ 80... General:  Alert & oriented; pleasant & cooperative... HEENT:  [place]/AT, EOM-wnl, PERRLA, Fundi-benign, EACs-clear, TMs-wnl,  NOSE-clear, THROAT-clear & wnl.  Neck:  Supple w/ full ROM; no JVD; normal carotid impulses & faint right neck bruit; no thyromegaly or nodules palpated; no lymphadenopathy.  Chest:  Clear to P & A; without wheezes/ rales/ or rhonchi heard... Heart:  Regular Rhythm; pacer palpated in left upper chest wall, norm S1 & S2 without murmurs/ rubs/ or gallops detected... Abdomen:  Soft & nontender; normal bowel sounds; no organomegaly or masses palpated... Ext:  Normal ROM; without  deformities or arthritic changes; no varicose veins, venous insuffic, or edema... Derm:  No lesions noted; no rash etc... Scar on bridge of nose from prev skin ca... Lymph:  No cervical, supraclavicular, axillary, or inguinal adenopathy palpated...    Assessment & Plan:    06/07/16>   Andrik is a walking time-bomb medically- smoker w/ COPD & abnCXR, known HBP, heart dis/pacer, HL, DM, and hx poor compliance w/ medical therapy & my recommendations for his care; he can't vs won't quit smoking, needs f/u w/ Cards-DrTaylor, needs statin added to regimen (declines), needs Endocrine-DM consult for on-going management of his DM => we will refer to [facility] ASAP. 12/20/16>   Andrew Wiggins had some significant stress & family issues over the last 29mo forcing him to cancel the planned Endocrine/DM consult and he never let us know about these problems;  In the interim he has developed some DM neuropathy symptoms in his LEs (mild & he doesn't want Rx yet), he has continued smoking at least 6cig/d, and NOT really dieting (asked to follow a low carb, no sweets, low sodium diet);  He is intol to Metformin & not currently taking any DM meds;  Follow up labs show FBS=232 & A1c=9.3, and his CXR is clear;  We discussed starting GLIMEPIRIDE 4mg  Qam in the interim & getting in to see Endocrine/DM ASAP-- he also needs a f/u w/ DrTaylor for CARDS/EP (last seen 12/10/14 and last transmission was 09/27/15).   Pulm> COPD, AbnCXR, CigSmoker> CXRs w/o acute changes; must quit all smoking but he is not inclined toward counseling etc...  HBP>  BP is controlled on Lisin20- reminded to take it every day!  Cardiac> LBBB, Pacer>  Followed by DrTaylor & due for f/u visit & pacer check...  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  HYPERLIPID>  FLP not controlled on diet alone, esp w/ mult coronary risk factors; I have advised STATIN therapy but he declines,  wants diet alone...  NEW => DM w/ FBS 11/2015 = 199 & we reviewed diet, exercise, start METFORMIN-ER 500mg  Qam=> this was stopped 02/2016 by [facility] & pt never followed up w/ me... ~  Pt returned 06/07/16 for ROV on diet alone & Labs showed BS=279, A1c=10.9, and he is being referred to Endocrine-DM ASAP for consultation and DM management...   PAD>  Presented 10/13 w/ LE pain on ambulation; Art dopplers ==> wnl...  GERD> min symptoms and uses OTC Prilosec as needed...  Need for baseline Colonoscopy>  He hasn't yet done his baseline colonoscopy & reminded again...  Headaches>  Uses Goodies as needed...   Patient's Medications  New Prescriptions   No medications on file  Previous Medications   ASPIRIN 81 MG TABLET    Take 81 mg by mouth daily.     ASPIRIN-ACETAMINOPHEN-CAFFEINE (GOODY HEADACHE PO)    Take 1 packet by mouth daily as needed for headaches   CHOLECALCIFEROL (VITAMIN D) 1000 UNITS TABLET  Take 1,000 Units by mouth daily.   GUAIFENESIN (MUCINEX) 600 MG 12 HR TABLET    Take 600 mg by mouth 2 (two) times daily.   LISINOPRIL (PRINIVIL,ZESTRIL) 20 MG TABLET    Take 1 tablet (20 mg total) by mouth daily.   VITAMIN B-12 (CYANOCOBALAMIN) 1000 MCG TABLET    Take 1,000 mcg by mouth daily.  Modified Medications   No medications on file  Discontinued Medications   FLUTICASONE-SALMETEROL (ADVAIR) 250-50 MCG/DOSE AEPB    Inhale 1 puff into the lungs every 12 (twelve) hours.   METFORMIN (GLUCOPHAGE-XR) 500 MG 24 HR TABLET    Take 1 tablet (500 mg total) by mouth daily with breakfast.

## 2016-12-20 NOTE — Patient Instructions (Signed)
Today we updated your med list in our EPIC system...    Continue your current medications the same...  Today we rechecked your blood work & a follow up CXR...    We will contact you w/ the results when available...   We will arrange for an ASAP appt w/ Endocrine/ Diabetes for consultation & to manage your DM going forward...  We will also arrange for a follow up appt w/ CARDS- DrTaylor to check your pacemaker...  Call for any questions or if I can be of service in any way...  Let's plan a follow up visit in 60mo, sooner if needed for problems.[name]..[name]

## 2016-12-21 ENCOUNTER — Telehealth: Payer: Self-pay | Admitting: Pulmonary Disease

## 2016-12-21 DIAGNOSIS — E0840 Diabetes mellitus due to underlying condition with diabetic neuropathy, unspecified: Secondary | ICD-10-CM

## 2016-12-21 MED ORDER — GLIMEPIRIDE 4 MG PO TABS: 4.0000 mg | ORAL_TABLET | Freq: Every day | ORAL | 2 refills | Status: DC

## 2016-12-21 NOTE — Telephone Encounter (Signed)
Please notify patient>   LABS show FBS=232 & A1c=9.3; these numbers are sl better than 62mo ago but indicative of severe/ poorly controlled DM...  REC-- start GLIMEPIRIDE 4mg  one tab Qam Disp #30 or 90 w/ refills & referral to [facility] for DM management...   Please notify patient>   CXR is ok w/ norm heart size, pacer ok, lungs sl hyperinflated c/w COPD but clear- NAD...  He must quit all smoking...  -------------------------  Spoke with pt, aware of results/recs.  rx sent to preferred pharmacy, referral to endocrinology placed.  Nothing further needed.

## 2017-01-25 ENCOUNTER — Encounter: Payer: Self-pay | Admitting: Internal Medicine

## 2017-01-25 ENCOUNTER — Ambulatory Visit (INDEPENDENT_AMBULATORY_CARE_PROVIDER_SITE_OTHER): Payer: PPO | Admitting: Internal Medicine

## 2017-01-25 ENCOUNTER — Encounter (INDEPENDENT_AMBULATORY_CARE_PROVIDER_SITE_OTHER): Payer: Self-pay

## 2017-01-25 VITALS — BP 160/90 | HR 81 | Ht 69.0 in | Wt 144.0 lb

## 2017-01-25 DIAGNOSIS — I10 Essential (primary) hypertension: Secondary | ICD-10-CM

## 2017-01-25 DIAGNOSIS — I442 Atrioventricular block, complete: Secondary | ICD-10-CM

## 2017-01-25 NOTE — Progress Notes (Signed)
HPI Andrew Wiggins returns today for follow-up. He is a very pleasant 61 year old man with complete heart block, status post pacemaker insertion, hypertension, and COPD, who returns today for ongoing evaluation and management of the above problems. In the interim, he has done well. He continues to smoke less than a half pack of cigarettes a day. No chest pain, no shortness of breath, no syncope, no peripheral edema. He does complain of some numbness and burning sensation in his feet at nighttime which is consistent with his diabetic neuropathy. Finally, he has known history of whitecoat hypertension. No Known Allergies   Current Outpatient Prescriptions  Medication Sig Dispense Refill  . aspirin 81 MG tablet Take 81 mg by mouth daily.      . Aspirin-Acetaminophen-Caffeine (GOODY HEADACHE PO) Take 1 packet by mouth daily as needed for headaches    . glimepiride (AMARYL) 4 MG tablet Take 1 tablet (4 mg total) by mouth daily with breakfast. 30 tablet 2  . lisinopril (PRINIVIL,ZESTRIL) 20 MG tablet Take 1 tablet (20 mg total) by mouth daily. 90 tablet 1   No current facility-administered medications for this visit.      Past Medical History:  Diagnosis Date  . Abnormal chest x-ray   . AV block    history of  . Cardiac pacemaker in situ   . Chest wall pain   . COPD (chronic obstructive pulmonary disease) (HCC)   . Dermatitis, contact    due to poison ivy  . GERD (gastroesophageal reflux disease)   . Headache(784.0)   . Hypertension   . Peripheral vascular disease (HCC)   . Tobacco use disorder     ROS:   All systems reviewed and negative except as noted in the HPI.   Past Surgical History:  Procedure Laterality Date  . laproscopic cholecystectomy  2000   by Dr. Odie Sera  . PACEMAKER INSERTION  03/2008   by Dr. Ladona Ridgel     No family history on file.   Social History   Social History  . Marital status: Legally Separated    Spouse name: N/A  . Number of children: 2   . Years of education: N/A   Occupational History  . unemployed     works for himself   Social History Main Topics  . Smoking status: Current Every Day Smoker    Packs/day: 0.50    Years: 25.00    Types: Cigarettes  . Smokeless tobacco: Never Used     Comment: 10 cigs per day --1.30.17  . Alcohol use No  . Drug use: No  . Sexual activity: Not on file   Other Topics Concern  . Not on file   Social History Narrative   2 siblings died with leukemia around age 97     BP (!) 160/90   Pulse 81   Ht  (1.753 m)   Wt 144 lb (65.3 kg)   BMI 21.27 kg/m   Physical Exam:  diskempt appearing 61 year old man, NAD HEENT: Unremarkable Neck:  6 cm JVD, no thyromegally Lymphatics:  No adenopathy Back:  No CVA tenderness Lungs:  Clear, with no wheezes, rales, or rhonchi HEART:  Regular rate rhythm, no murmurs, no rubs, no clicks Abd:  soft, positive bowel sounds, no organomegally, no rebound, no guarding Ext:  2 plus pulses, no edema, no cyanosis, no clubbing Skin:  No rashes no nodules Neuro:  CN II through XII intact, motor grossly intact   DEVICE  Normal device function.  See PaceArt for details.   Assess/Plan: 1. Complete heart block - he is doing well status post pacemaker insertion. He has no underlying escape rhythm today. 2. Pacemaker - his Medtronic dual-chamber pacemaker is working normally. We'll plan a recheck in several months. He has approximate 6 years of battery longevity left. 3. Hypertension - his blood pressure in the office today is elevated, but he notes that is always high when he is in the doctor's office. At home it is well controlled. I've encouraged the patient to maintain a low-sodium diet, and he will continue his blood pressure lowering medications.  Andrew Wiggins, M.D.

## 2017-01-25 NOTE — Patient Instructions (Addendum)
Medication Instructions:  Your physician recommends that you continue on your current medications as directed. Please refer to the Current Medication list given to you today.  Labwork: None ordered.  Testing/Procedures: None ordered.  Follow-Up: Your physician wants you to follow-up in: one year with Dr. Ladona Ridgel.   You will receive a reminder letter in the mail two months in advance. If you don't receive a letter, please call our office to schedule the follow-up appointment.  Remote monitoring is used to monitor your Pacemaker from home. This monitoring reduces the number of office visits required to check your device to one time per year. It allows [place] to keep an eye on the functioning of your device to ensure it is working properly. You are scheduled for a device check from home on 04/26/2017. You may send your transmission at any time that day. If you have a wireless device, the transmission will be sent automatically. After your physician reviews your transmission, you will receive a postcard with your next transmission date.    Any Other Special Instructions Will Be Listed Below (If Applicable).     If you need a refill on your cardiac medications before your next appointment, please call your pharmacy.

## 2017-02-03 LAB — CUP PACEART INCLINIC DEVICE CHECK
Battery Remaining Longevity: 71 mo
Battery Voltage: 2.79 V
Date Time Interrogation Session: 20180920175143
Implantable Lead Implant Date: 20091120
Implantable Lead Model: 5076
Implantable Pulse Generator Implant Date: 20091120
Lead Channel Impedance Value: 462 Ohm
Lead Channel Pacing Threshold Pulse Width: 0.4 ms
Lead Channel Setting Pacing Amplitude: 1.5 V
Lead Channel Setting Pacing Amplitude: 2 V
Lead Channel Setting Pacing Pulse Width: 0.4 ms
Lead Channel Setting Sensing Sensitivity: 5.6 mV
MDC IDC LEAD IMPLANT DT: 20091120
MDC IDC LEAD LOCATION: 753859
MDC IDC LEAD LOCATION: 753860
MDC IDC MSMT BATTERY IMPEDANCE: 935 Ohm
MDC IDC MSMT LEADCHNL RA PACING THRESHOLD AMPLITUDE: 0.75 V
MDC IDC MSMT LEADCHNL RA PACING THRESHOLD PULSEWIDTH: 0.4 ms
MDC IDC MSMT LEADCHNL RA SENSING INTR AMPL: 4 mV
MDC IDC MSMT LEADCHNL RV IMPEDANCE VALUE: 1012 Ohm
MDC IDC MSMT LEADCHNL RV PACING THRESHOLD AMPLITUDE: 0.5 V
MDC IDC STAT BRADY AP VP PERCENT: 5 %
MDC IDC STAT BRADY AP VS PERCENT: 0 %
MDC IDC STAT BRADY AS VP PERCENT: 95 %
MDC IDC STAT BRADY AS VS PERCENT: 0 %

## 2017-02-07 ENCOUNTER — Encounter: Payer: Self-pay | Admitting: Endocrinology

## 2017-02-07 ENCOUNTER — Ambulatory Visit (INDEPENDENT_AMBULATORY_CARE_PROVIDER_SITE_OTHER): Payer: PPO | Admitting: Endocrinology

## 2017-02-07 VITALS — BP 140/88 | HR 96 | Ht 68.75 in | Wt 148.8 lb

## 2017-02-07 DIAGNOSIS — E1165 Type 2 diabetes mellitus with hyperglycemia: Secondary | ICD-10-CM | POA: Diagnosis not present

## 2017-02-07 DIAGNOSIS — E785 Hyperlipidemia, unspecified: Secondary | ICD-10-CM

## 2017-02-07 LAB — BASIC METABOLIC PANEL
BUN: 11 mg/dL (ref 6–23)
CALCIUM: 9.9 mg/dL (ref 8.4–10.5)
CO2: 29 mEq/L (ref 19–32)
CREATININE: 0.85 mg/dL (ref 0.40–1.50)
Chloride: 105 mEq/L (ref 96–112)
GFR: 97.03 mL/min (ref >60.00)
GLUCOSE: 127 mg/dL — AB (ref 70–99)
POTASSIUM: 4.4 meq/L (ref 3.5–5.1)
Sodium: 140 mEq/L (ref 135–145)

## 2017-02-07 LAB — GLUCOSE, POCT (MANUAL RESULT ENTRY): POC Glucose: 191 mg/dl — AB (ref 70–99)

## 2017-02-07 LAB — MICROALBUMIN / CREATININE URINE RATIO
Creatinine,U: 196.4 mg/dL
MICROALB UR: 2.9 mg/dL — AB (ref 0.0–1.9)
Microalb Creat Ratio: 1.5 mg/g (ref 0.0–30.0)

## 2017-02-07 MED ORDER — PIOGLITAZONE HCL 15 MG PO TABS: 15.0000 mg | ORAL_TABLET | Freq: Every day | ORAL | 2 refills | Status: DC

## 2017-02-07 NOTE — Progress Notes (Signed)
Patient ID: Andrew Wiggins, male   DOB: Apr 11, 1956, 61 y.o.   MRN: [id number]          Reason for Appointment: Consultation for Type 2 Diabetes  Referring physician: Lorin Picket NADEL   History of Present Illness:          Date of diagnosis of type 2 diabetes mellitus: 7/17      Background history:   His glucose was 199 done in 11/2015 He was recommended metformin but this caused diarrhea and possibly abdominal pain and was not continued after 3 weeks Subsequently the patient has not been on any treatment or had any regular follow-up His A1c in 1/18 was 10.9 and he was recommended endocrinology consultation but apparently the patient canceled this  Recent history:   Non-insulin hypoglycemic drugs the patient is taking are: Amaryl 4 mg daily  Current management, blood sugar patterns and problems identified:  The patient had a follow-up in August with showed an A1c of 9.3 in the lab glucose of 232  At that point he was started on Amaryl 4 mg daily  He has not seen a dietitian and has not been shown how to use a glucose monitor     He is not very active because of joint pains and does not do any formal walking  He says that he is only eating toast for breakfast with no protein  He is generally drinking regular Pepsi and has sugar in his coffee  Side effects from medications have been: Diarrhea from metformin ER  Compliance with the medical regimen: Fair Hypoglycemia:   none reported  Glucose monitoring:  not done         Glucometer: None  .      Blood Glucose readings not available   Self-care: The diet that the patient has been following is: None   Typical meal intake: Breakfast is Toast               Dietician visit, most recent: none               Exercise: none   Weight history: Has been up to 159 in 2017  Wt Readings from Last 3 Encounters:  02/07/17 148 lb 12.8 oz (67.5 kg)  01/25/17 144 lb (65.3 kg)  12/20/16 144 lb 2 oz (65.4 kg)    Glycemic control:   Lab  Results  Component Value Date   HGBA1C 9.3 (H) 12/20/2016   HGBA1C 10.9 (H) 06/07/2016   HGBA1C (H) 03/27/2008    6.5 (NOTE)   The ADA recommends the following therapeutic goal for glycemic   control related to Hgb A1C measurement:   Goal of Therapy:   < 7.0% Hgb A1C   Reference: American Diabetes Association: Clinical Practice   Recommendations 2008, Diabetes Care,  2008, 31:(Suppl 1).   Lab Results  Component Value Date   LDLCALC 109 (H) 10/24/2013   CREATININE 0.78 12/20/2016   No results found for: MICRALBCREAT  No results found for: FRUCTOSAMINE    Allergies as of 02/07/2017   No Known Allergies     Medication List       Accurate as of 02/07/17  2:50 PM. Always use your most recent med list.          aspirin 81 MG tablet Take 81 mg by mouth daily.   glimepiride 4 MG tablet Commonly known as:  AMARYL Take 1 tablet (4 mg total) by mouth daily with breakfast.   GOODY HEADACHE PO Take  1 packet by mouth daily as needed for headaches   lisinopril 20 MG tablet Commonly known as:  PRINIVIL,ZESTRIL Take 1 tablet (20 mg total) by mouth daily.   pioglitazone 15 MG tablet Commonly known as:  ACTOS Take 1 tablet (15 mg total) by mouth daily.       Allergies: No Known Allergies  Past Medical History:  Diagnosis Date  . Abnormal chest x-ray   . AV block    history of  . Cardiac pacemaker in situ   . Chest wall pain   . COPD (chronic obstructive pulmonary disease) (HCC)   . Dermatitis, contact    due to poison ivy  . GERD (gastroesophageal reflux disease)   . Headache(784.0)   . Hypertension   . Peripheral vascular disease (HCC)   . Tobacco use disorder     Past Surgical History:  Procedure Laterality Date  . laproscopic cholecystectomy  2000   by Dr. Odie Sera  . PACEMAKER INSERTION  03/2008   by Dr. Ladona Ridgel    Family History  Problem Relation Age of Onset  . Diabetes Brother     Social History:  reports that he has been smoking Cigarettes.  He  has a 12.50 pack-year smoking history. He has never used smokeless tobacco. He reports that he does not drink alcohol or use drugs.   Review of Systems  Constitutional: Positive for weight loss. Negative for reduced appetite.  HENT: Negative for headaches.   Eyes:       Occasionally may have blurred vision  Respiratory: Positive for shortness of breath.        Mild shortness of breath on exertion  Cardiovascular: Negative for chest pain and claudication.  Gastrointestinal: Negative for constipation and diarrhea.  Endocrine: Positive for fatigue. Negative for decreased libido.  Genitourinary: Positive for nocturia.       Getting overnight about 2 times.  Occasionally has slower urine stream  Musculoskeletal: Positive for joint pain.       Has pain in the hip area of the left and some in the shoulders  Skin: Negative for dry skin.  Neurological: Negative for numbness and tingling.       Sometimes gets a little burning in his toes  Psychiatric/Behavioral: Negative for insomnia.     Lipid history:  Labs as follows, has not been on medications recently    Lab Results  Component Value Date   CHOL 209 (H) 06/07/2016   HDL 30.20 (L) 06/07/2016   LDLCALC 109 (H) 10/24/2013   LDLDIRECT 110.0 06/07/2016   TRIG 246.0 (H) 06/07/2016   CHOLHDL 7 06/07/2016           Hypertension: On rx for years with lisinopril  BP Readings from Last 3 Encounters:  02/07/17 (!) 150/90  01/25/17 (!) 160/90  12/20/16 (!) 150/80     Most recent eye exam was A few years ago  Most recent foot exam: 10/18       Physical Examination:  BP (!) 150/90   Pulse 96   Ht 5' 8.75" (1.746 m)   Wt 148 lb 12.8 oz (67.5 kg)   SpO2 96%   BMI 22.13 kg/m   GENERAL:   Averagely built and nourished HEENT:         Eye exam shows normal external appearance. Fundus exam shows no retinopathy.  Oral exam shows normal mucosa with slightly dry tongue   NECK:   There is no lymphadenopathy Thyroid is not  enlarged and no nodules felt.  Carotids are normal to palpation and no bruit heard LUNGS:         Chest is symmetrical. Lungs are clear to auscultation.[name]   HEART:         Heart sounds:  S1 and S2 are normal. No murmur or click heard., no S3 or S4.   ABDOMEN:   There is no distention present. Liver and spleen are not palpable.  No renal artery bruit heard No other mass or tenderness present.   NEUROLOGICAL:   Ankle jerks are 1+ bilaterally.  biceps reflexes brisk   Diabetic Foot Exam - Simple   Simple Foot Form Diabetic Foot exam was performed with the following findings:  Yes 02/07/2017  1:47 PM  Visual Inspection No deformities, no ulcerations, no other skin breakdown bilaterally:  Yes See comments:  Yes Sensation Testing Intact to touch and monofilament testing bilaterally:  Yes Pulse Check See comments:  Yes Comments Severe onychomycosis of great toenails with deformity bilaterally Pedal pulses decreased bilaterally, right posterior tibialis not palpable            Vibration sense is Mildly reduced in distal first toes.  MUSCULOSKELETAL:  There is no swelling or deformity of the peripheral joints. [name]   EXTREMITIES:     There is no edema. No skin lesions present.[name] SKIN:       No rash        ASSESSMENT:  Diabetes type 2, uncontrolled  He has had diabetes for at least a year Currently only on Amaryl for the last 6 weeks but does not have adequate control, glucose 191 today He has had no diabetes education and no glucose monitoring at home He can do better with his diet especially with cutting back on regular soft drinks, having balanced meals with some protein Also not exercising  Discussed with patient that Amaryl only will be effective for the first few years and he needs a more long-term plan for pharmacological treatment of his diabetes   He is intolerant to metformin  Complications of diabetes: None evident, needs urine microalbumin  History of hypertension: Tends to  have relatively high blood pressure readings in the office  DYSLIPIDEMIA: He has a relatively high LDL over 100 and low HDL  PLAN:     He needs to start seeing the dietitian for meal planning  Encouraged him to taper down his intake of sweet drinks like Pepsi and reduce carbohydrates and fats  Have shown him how to use an Accu-Chek monitor   Discussed how often and when to check his blood sugars  Since ACTOS may be effective and would be inexpensive in combination with Amaryl he can start with 15 mg daily.  Discussed benefits, improvement of insulin resistance, HDL and triglycerides, side effects of possible ankle swelling or shortness of breath with high doses   Will consider increasing the dose on the next visit  He may also be a candidate for Invokana if this is not effective, cost may be an issue  We will see him back in 6 weeks for follow-up  Patient Instructions  Get eye exam  Check blood sugars on waking up 2/7 days   Also check blood sugars about 2 hours after a meal and do this after different meals by rotation  Recommended blood sugar levels on waking up is 90-130 and about 2 hours after meal is 130-160  Please bring your blood sugar monitor to each visit, thank you  Taper off sugar intake  Consultation note has been sent to the referring physician  [facility] 02/07/2017, 2:50 PM   Note: This office note was prepared with Dragon voice recognition system technology. Any transcriptional errors that result from this process are unintentional.

## 2017-02-07 NOTE — Patient Instructions (Addendum)
Get eye exam  Check blood sugars on waking up 2/7 days   Also check blood sugars about 2 hours after a meal and do this after different meals by rotation  Recommended blood sugar levels on waking up is 90-130 and about 2 hours after meal is 130-160  Please bring your blood sugar monitor to each visit, thank you  Taper off sugar intake

## 2017-02-08 LAB — FRUCTOSAMINE: Fructosamine: 284 umol/L (ref 0–285)

## 2017-03-16 ENCOUNTER — Other Ambulatory Visit: Payer: Self-pay | Admitting: Pulmonary Disease

## 2017-03-16 ENCOUNTER — Telehealth: Payer: Self-pay | Admitting: Pulmonary Disease

## 2017-03-16 MED ORDER — LISINOPRIL 20 MG PO TABS: 20.0000 mg | ORAL_TABLET | Freq: Every day | ORAL | 1 refills | Status: DC

## 2017-03-16 NOTE — Telephone Encounter (Signed)
Rx sent to pharmacy but no answer. I left a detailed message letting pt know his Rx was sent to the pharmacy.

## 2017-03-21 ENCOUNTER — Ambulatory Visit: Payer: PPO | Admitting: Endocrinology

## 2017-04-06 ENCOUNTER — Other Ambulatory Visit: Payer: PPO

## 2017-04-08 IMAGING — CR DG CHEST 2V
2 series · 2 of 2 positions shown · non-contrast
Comparison: 10/24/2013

CLINICAL DATA: COPD.

EXAM:
CHEST - 2 VIEW

[view not recorded (1 of 2)]
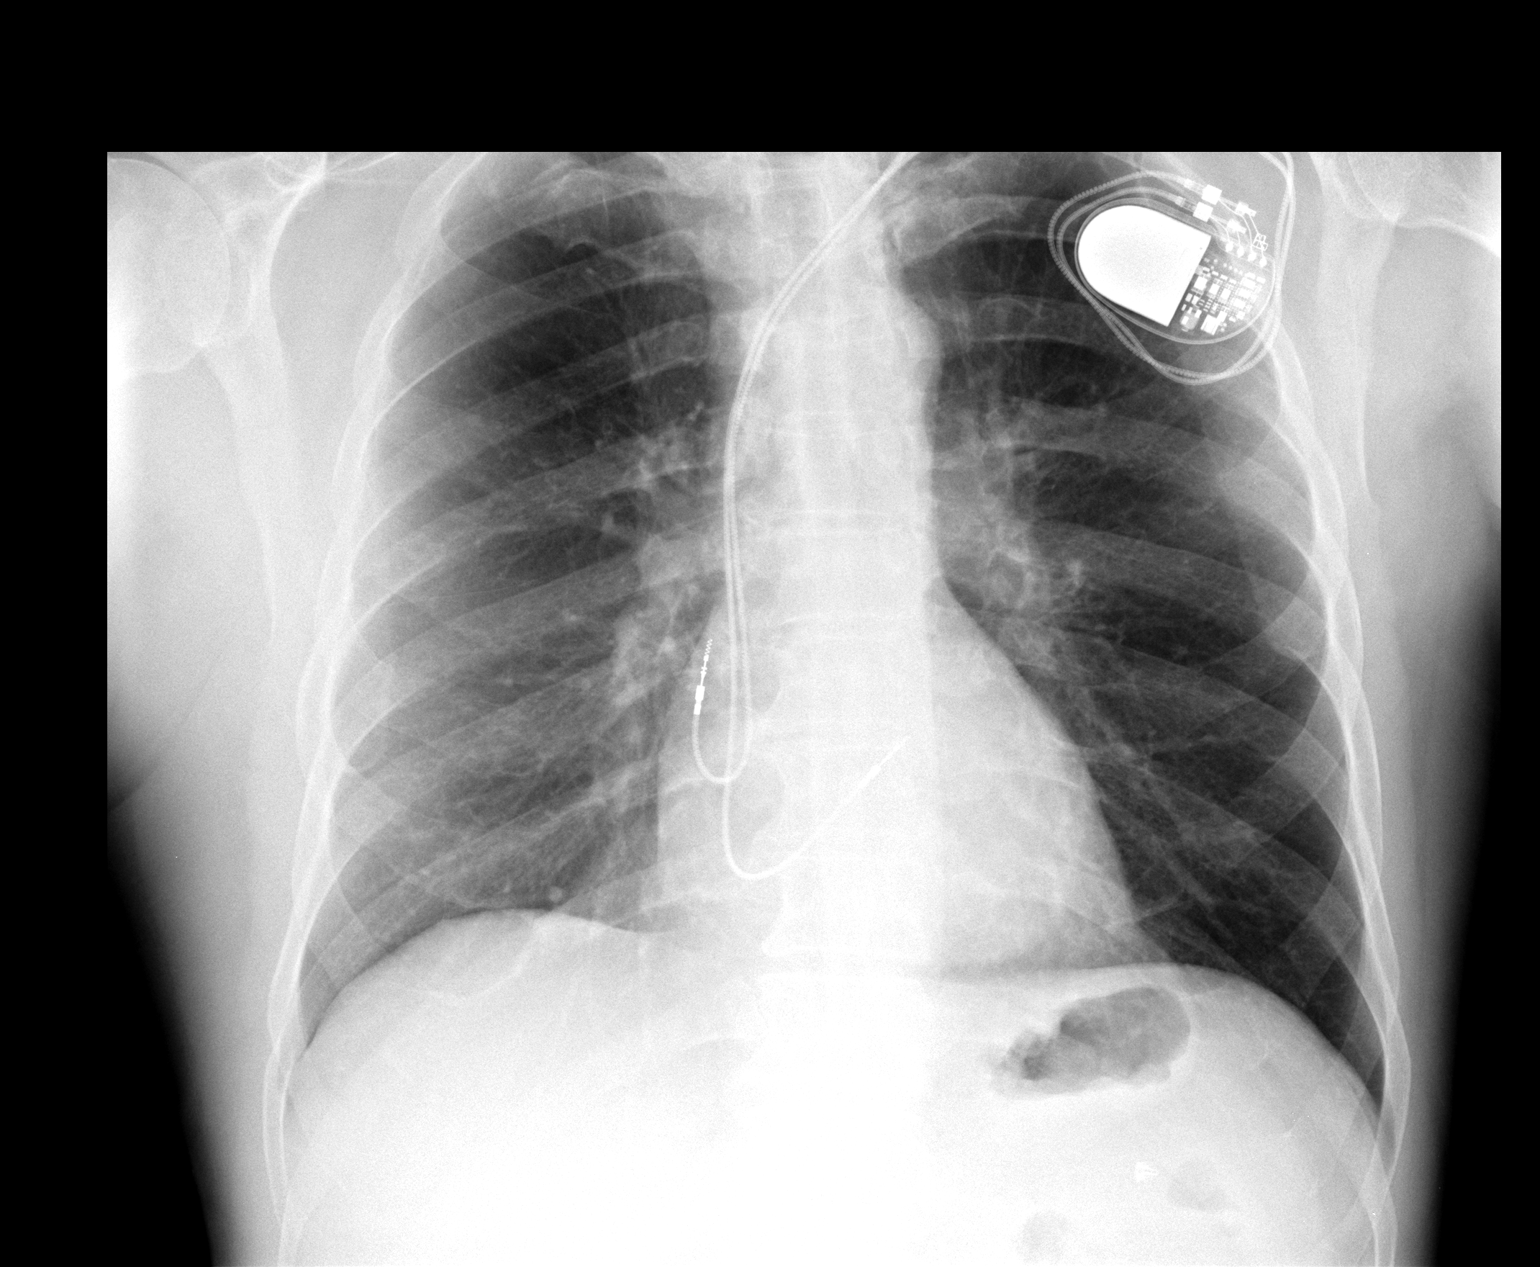

[view not recorded (2 of 2)]
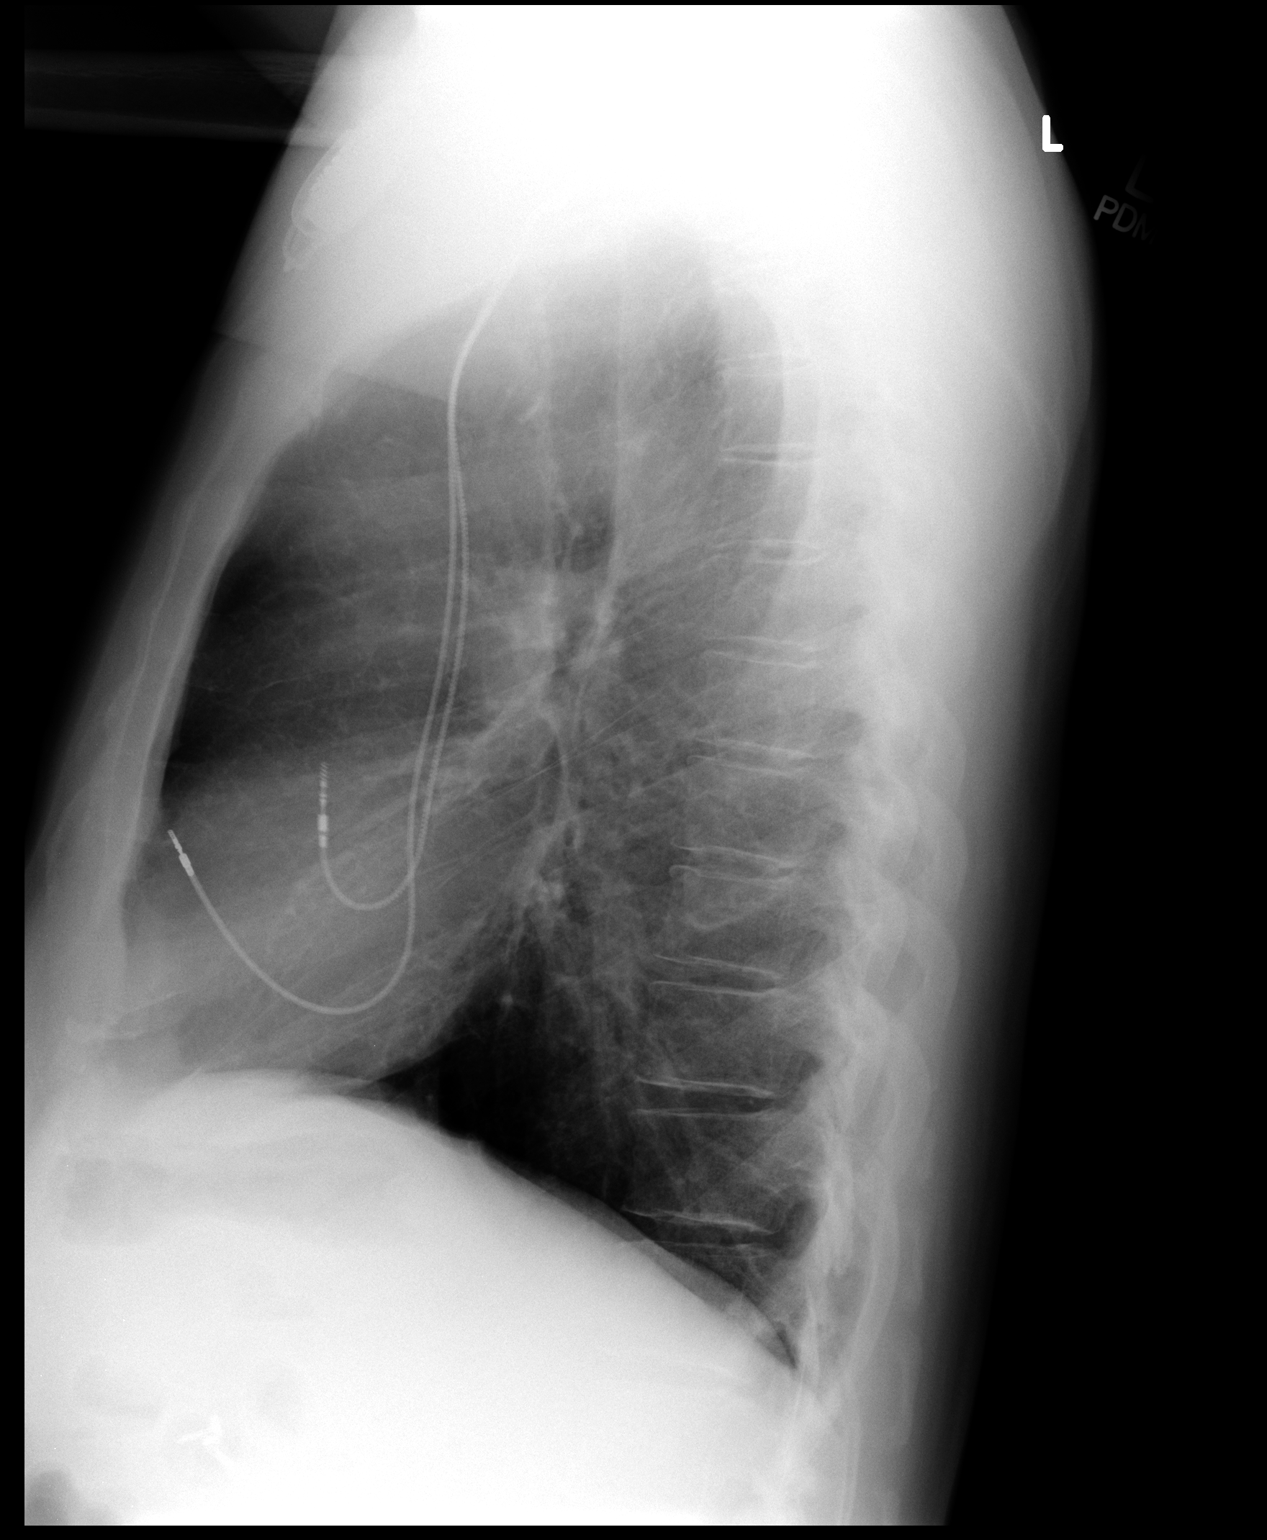

[2 of 2 positions shown; findings below may reference images not displayed]

FINDINGS: The heart size and mediastinal contours are within normal limits.
Stable appearance of dual-chamber pacemaker. Stable mild chronic
lung disease. There is no evidence of pulmonary edema,
consolidation, pneumothorax, nodule or pleural fluid. The visualized
skeletal structures are unremarkable.
IMPRESSION: Stable mild chronic lung disease.  No active disease.

## 2017-04-09 ENCOUNTER — Telehealth: Payer: Self-pay | Admitting: Pulmonary Disease

## 2017-04-09 ENCOUNTER — Telehealth: Payer: Self-pay | Admitting: Endocrinology

## 2017-04-09 ENCOUNTER — Other Ambulatory Visit: Payer: Self-pay

## 2017-04-09 MED ORDER — GLIMEPIRIDE 4 MG PO TABS: 4.0000 mg | ORAL_TABLET | Freq: Every day | ORAL | 2 refills | Status: DC

## 2017-04-09 NOTE — Telephone Encounter (Signed)
Pt request GLIMEPIRIDE 4 MG once daily.  Pt says Dr Kriste BasqueNadel pulmonogist told him to call here for refills of Glimepiride. [facility] [place] [phone].  Also call pt to confirm [phone].

## 2017-04-09 NOTE — Telephone Encounter (Signed)
ok 

## 2017-04-09 NOTE — Telephone Encounter (Signed)
Called patient and left a voice message to let him know that I have sent over this prescription to [facility] in [place] for him per Dr. Lucianne MussKumar.

## 2017-04-09 NOTE — Telephone Encounter (Signed)
Please advise if okay to fill  

## 2017-04-09 NOTE — Telephone Encounter (Signed)
Spoke with pt and advised him to call Dr Remus BlakeKumar's office for refill.  PT verbalized understanding.  Nothing further needed

## 2017-04-11 ENCOUNTER — Ambulatory Visit: Payer: PPO | Admitting: Endocrinology

## 2017-04-11 NOTE — Progress Notes (Deleted)
Patient ID: Andrew Wiggins, male   DOB: Jan 16, 1956, 61 y.o.   MRN: [id number]          Reason for Appointment: Consultation for Type 2 Diabetes  Referring physician: Lorin PicketScott NADEL   History of Present Illness:          Date of diagnosis of type 2 diabetes mellitus: 7/17      Background history:   His glucose was 199 done in 11/2015 He was recommended metformin but this caused diarrhea and possibly abdominal pain and was not continued after 3 weeks Subsequently the patient has not been on any treatment or had any regular follow-up His A1c in 1/18 was 10.9 and he was recommended endocrinology consultation but apparently the patient canceled this  Recent history:   Non-insulin hypoglycemic drugs the patient is taking are: Amaryl 4 mg daily  Current management, blood sugar patterns and problems identified:  The patient had a follow-up in August with showed an A1c of 9.3 in the lab glucose of 232  At that point he was started on Amaryl 4 mg daily  He has not seen a dietitian and has not been shown how to use a glucose monitor     He is not very active because of joint pains and does not do any formal walking  He says that he is only eating toast for breakfast with no protein  He is generally drinking regular Pepsi and has sugar in his coffee  Side effects from medications have been: Diarrhea from metformin ER  Compliance with the medical regimen: Fair Hypoglycemia:   none reported  Glucose monitoring:  not done         Glucometer: None  .      Blood Glucose readings not available   Self-care: The diet that the patient has been following is: None   Typical meal intake: Breakfast is Toast               Dietician visit, most recent: none               Exercise: none   Weight history: Has been up to 159 in 2017  Wt Readings from Last 3 Encounters:  02/07/17 148 lb 12.8 oz (67.5 kg)  01/25/17 144 lb (65.3 kg)  12/20/16 144 lb 2 oz (65.4 kg)    Glycemic control:   Lab  Results  Component Value Date   HGBA1C 9.3 (H) 12/20/2016   HGBA1C 10.9 (H) 06/07/2016   HGBA1C (H) 03/27/2008    6.5 (NOTE)   The ADA recommends the following therapeutic goal for glycemic   control related to Hgb A1C measurement:   Goal of Therapy:   < 7.0% Hgb A1C   Reference: American Diabetes Association: Clinical Practice   Recommendations 2008, Diabetes Care,  2008, 31:(Suppl 1).   Lab Results  Component Value Date   MICROALBUR 2.9 (H) 02/07/2017   LDLCALC 109 (H) 10/24/2013   CREATININE 0.85 02/07/2017   Lab Results  Component Value Date   MICRALBCREAT 1.5 02/07/2017    Lab Results  Component Value Date   FRUCTOSAMINE 284 02/07/2017      Allergies as of 04/11/2017   No Known Allergies     Medication List        Accurate as of 04/11/17  1:09 PM. Always use your most recent med list.          aspirin 81 MG tablet Take 81 mg by mouth daily.   glimepiride 4 MG  tablet Commonly known as:  AMARYL Take 1 tablet (4 mg total) by mouth daily with breakfast.   GOODY HEADACHE PO Take 1 packet by mouth daily as needed for headaches   lisinopril 20 MG tablet Commonly known as:  PRINIVIL,ZESTRIL Take 1 tablet (20 mg total) daily by mouth.   pioglitazone 15 MG tablet Commonly known as:  ACTOS Take 1 tablet (15 mg total) by mouth daily.       Allergies: No Known Allergies  Past Medical History:  Diagnosis Date  . Abnormal chest x-ray   . AV block    history of  . Cardiac pacemaker in situ   . Chest wall pain   . COPD (chronic obstructive pulmonary disease) (HCC)   . Dermatitis, contact    due to poison ivy  . GERD (gastroesophageal reflux disease)   . Headache(784.0)   . Hypertension   . Peripheral vascular disease (HCC)   . Tobacco use disorder     Past Surgical History:  Procedure Laterality Date  . laproscopic cholecystectomy  2000   by Dr. Odie Sera  . PACEMAKER INSERTION  03/2008   by Dr. Ladona Ridgel    Family History  Problem Relation Age  of Onset  . Diabetes Brother     Social History:  reports that he has been smoking cigarettes.  He has a 12.50 pack-year smoking history. he has never used smokeless tobacco. He reports that he does not drink alcohol or use drugs.   Review of Systems  Constitutional: Positive for weight loss. Negative for reduced appetite.  HENT: Negative for headaches.   Eyes:       Occasionally may have blurred vision  Respiratory: Positive for shortness of breath.        Mild shortness of breath on exertion  Cardiovascular: Negative for chest pain and claudication.  Gastrointestinal: Negative for constipation and diarrhea.  Endocrine: Positive for fatigue. Negative for decreased libido.  Genitourinary: Positive for nocturia.       Getting overnight about 2 times.  Occasionally has slower urine stream  Musculoskeletal: Positive for joint pain.       Has pain in the hip area of the left and some in the shoulders  Skin: Negative for dry skin.  Neurological: Negative for numbness and tingling.       Sometimes gets a little burning in his toes  Psychiatric/Behavioral: Negative for insomnia.     Lipid history:  Labs as follows, has not been on medications recently    Lab Results  Component Value Date   CHOL 209 (H) 06/07/2016   HDL 30.20 (L) 06/07/2016   LDLCALC 109 (H) 10/24/2013   LDLDIRECT 110.0 06/07/2016   TRIG 246.0 (H) 06/07/2016   CHOLHDL 7 06/07/2016           Hypertension: On rx for years with lisinopril  BP Readings from Last 3 Encounters:  02/07/17 140/88  01/25/17 (!) 160/90  12/20/16 (!) 150/80     Most recent eye exam was A few years ago  Most recent foot exam: 10/18       Physical Examination:  There were no vitals taken for this visit.  GENERAL:   Averagely built and nourished HEENT:         Eye exam shows normal external appearance. Fundus exam shows no retinopathy.  Oral exam shows normal mucosa with slightly dry tongue   NECK:   There is no  lymphadenopathy Thyroid is not enlarged and no nodules felt.  Carotids are normal  to palpation and no bruit heard LUNGS:         Chest is symmetrical. Lungs are clear to auscultation.[name].   HEART:         Heart sounds:  S1 and S2 are normal. No murmur or click heard., no S3 or S4.   ABDOMEN:   There is no distention present. Liver and spleen are not palpable.  No renal artery bruit heard No other mass or tenderness present.   NEUROLOGICAL:   Ankle jerks are 1+ bilaterally.  biceps reflexes brisk   Diabetic Foot Exam - Simple   No data filed             Vibration sense is Mildly reduced in distal first toes.  MUSCULOSKELETAL:  There is no swelling or deformity of the peripheral joints. [name].   EXTREMITIES:     There is no edema. No skin lesions present.[name]. SKIN:       No rash        ASSESSMENT:  Diabetes type 2, uncontrolled  He has had diabetes for at least a year Currently only on Amaryl for the last 6 weeks but does not have adequate control, glucose 191 today He has had no diabetes education and no glucose monitoring at home He can do better with his diet especially with cutting back on regular soft drinks, having balanced meals with some protein Also not exercising  Discussed with patient that Amaryl only will be effective for the first few years and he needs a more long-term plan for pharmacological treatment of his diabetes   He is intolerant to metformin  Complications of diabetes: None evident, needs urine microalbumin  History of hypertension: Tends to have relatively high blood pressure readings in the office  DYSLIPIDEMIA: He has a relatively high LDL over 100 and low HDL  PLAN:     He needs to start seeing the dietitian for meal planning  Encouraged him to taper down his intake of sweet drinks like Pepsi and reduce carbohydrates and fats  Have shown him how to use an Accu-Chek monitor   Discussed how often and when to check his blood sugars  Since ACTOS may be  effective and would be inexpensive in combination with Amaryl he can start with 15 mg daily.  Discussed benefits, improvement of insulin resistance, HDL and triglycerides, side effects of possible ankle swelling or shortness of breath with high doses   Will consider increasing the dose on the next visit  He may also be a candidate for Invokana if this is not effective, cost may be an issue  We will see him back in 6 weeks for follow-up  There are no Patient Instructions on file for this visit.   Consultation note has been sent to the referring physician  [facility]Rockie Schnoor 04/11/2017, 1:09 PM   Note: This office note was prepared with Dragon voice recognition system technology. Any transcriptional errors that result from this process are unintentional.

## 2017-04-26 ENCOUNTER — Ambulatory Visit (INDEPENDENT_AMBULATORY_CARE_PROVIDER_SITE_OTHER): Payer: PPO | Admitting: *Deleted

## 2017-04-26 ENCOUNTER — Telehealth: Payer: Self-pay | Admitting: Cardiology

## 2017-04-26 DIAGNOSIS — I442 Atrioventricular block, complete: Secondary | ICD-10-CM

## 2017-04-26 NOTE — Telephone Encounter (Signed)
LMOVM reminding pt to send remote transmission.   

## 2017-04-27 ENCOUNTER — Encounter: Payer: Self-pay | Admitting: Cardiology

## 2017-04-27 NOTE — Progress Notes (Signed)
Remote pacemaker transmission.   

## 2017-05-09 LAB — CUP PACEART REMOTE DEVICE CHECK
Battery Impedance: 1013 Ohm
Battery Remaining Longevity: 68 mo
Battery Voltage: 2.79 V
Brady Statistic AP VP Percent: 5 %
Brady Statistic AS VS Percent: 0 %
Implantable Lead Implant Date: 20091120
Implantable Lead Location: 753860
Implantable Lead Model: 5076
Implantable Lead Model: 5076
Lead Channel Impedance Value: 1039 Ohm
Lead Channel Impedance Value: 462 Ohm
Lead Channel Pacing Threshold Pulse Width: 0.4 ms
Lead Channel Setting Pacing Amplitude: 1.5 V
Lead Channel Setting Pacing Amplitude: 2 V
Lead Channel Setting Pacing Pulse Width: 0.4 ms
MDC IDC LEAD IMPLANT DT: 20091120
MDC IDC LEAD LOCATION: 753859
MDC IDC MSMT LEADCHNL RA PACING THRESHOLD AMPLITUDE: 0.625 V
MDC IDC MSMT LEADCHNL RV PACING THRESHOLD AMPLITUDE: 0.5 V
MDC IDC MSMT LEADCHNL RV PACING THRESHOLD PULSEWIDTH: 0.4 ms
MDC IDC PG IMPLANT DT: 20091120
MDC IDC SESS DTM: 20181220221943
MDC IDC SET LEADCHNL RV SENSING SENSITIVITY: 5.6 mV
MDC IDC STAT BRADY AP VS PERCENT: 0 %
MDC IDC STAT BRADY AS VP PERCENT: 95 %

## 2017-06-26 ENCOUNTER — Ambulatory Visit: Payer: PPO | Admitting: Pulmonary Disease

## 2017-07-04 ENCOUNTER — Other Ambulatory Visit (INDEPENDENT_AMBULATORY_CARE_PROVIDER_SITE_OTHER): Payer: PPO

## 2017-07-04 ENCOUNTER — Encounter: Payer: Self-pay | Admitting: Pulmonary Disease

## 2017-07-04 ENCOUNTER — Ambulatory Visit (INDEPENDENT_AMBULATORY_CARE_PROVIDER_SITE_OTHER): Payer: PPO | Admitting: Pulmonary Disease

## 2017-07-04 VITALS — BP 146/82 | HR 91 | Temp 98.0°F | Ht 69.0 in | Wt 152.0 lb

## 2017-07-04 DIAGNOSIS — I779 Disorder of arteries and arterioles, unspecified: Secondary | ICD-10-CM

## 2017-07-04 DIAGNOSIS — I459 Conduction disorder, unspecified: Secondary | ICD-10-CM

## 2017-07-04 DIAGNOSIS — E785 Hyperlipidemia, unspecified: Secondary | ICD-10-CM

## 2017-07-04 DIAGNOSIS — Z950 Presence of cardiac pacemaker: Secondary | ICD-10-CM

## 2017-07-04 DIAGNOSIS — J449 Chronic obstructive pulmonary disease, unspecified: Secondary | ICD-10-CM

## 2017-07-04 DIAGNOSIS — F17200 Nicotine dependence, unspecified, uncomplicated: Secondary | ICD-10-CM

## 2017-07-04 DIAGNOSIS — E0840 Diabetes mellitus due to underlying condition with diabetic neuropathy, unspecified: Secondary | ICD-10-CM

## 2017-07-04 DIAGNOSIS — J4489 Other specified chronic obstructive pulmonary disease: Secondary | ICD-10-CM

## 2017-07-04 DIAGNOSIS — I10 Essential (primary) hypertension: Secondary | ICD-10-CM

## 2017-07-04 DIAGNOSIS — I739 Peripheral vascular disease, unspecified: Secondary | ICD-10-CM

## 2017-07-04 LAB — BASIC METABOLIC PANEL
BUN: 10 mg/dL (ref 6–23)
CALCIUM: 10.6 mg/dL — AB (ref 8.4–10.5)
CO2: 31 meq/L (ref 19–32)
Chloride: 102 mEq/L (ref 96–112)
Creatinine, Ser: 0.84 mg/dL (ref 0.40–1.50)
GFR: 98.24 mL/min (ref >60.00)
Glucose, Bld: 183 mg/dL — ABNORMAL HIGH (ref 70–99)
Potassium: 4.3 mEq/L (ref 3.5–5.1)
SODIUM: 138 meq/L (ref 135–145)

## 2017-07-04 LAB — HEMOGLOBIN A1C: Hgb A1c MFr Bld: 7.5 % — ABNORMAL HIGH (ref 4.6–6.5)

## 2017-07-04 NOTE — Progress Notes (Signed)
Subjective:    Patient ID: Andrew Wiggins, male    DOB: 1956-02-19, 62 y.o.   MRN: [id number]  HPI 62 y/o WM here for a follow up visit... 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 ~  SEE PREV EPIC NOTES FOR OLDER DATA >>     CXR 10/13 showed normal heart size, pacer on left, clear lungs, NAD.Marland KitchenMarland Kitchen  EKG 10/13 showed NSR, rate85, LBBB, NAD...  LABS 10/13:  FLP- not at goals on diet alone;  Chems- wnl x BS=104;  CBC- wnl;  PSA= 0.54  Carotid Dopplers were ordered but never done...  Arterial Dopplers of LEs 11/13 were WNL- normal waveforms, no blockages...   ~  October 24, 2013:  63mo ROV & Andrew Wiggins is c/o some night sweats and "hot flashes" which he thinks is "the change"; still smoking 1/2 ppd but denies cardioresp symptoms; we discussed trial of "black cohosh" & noted he may need endocrine eval if routine studies are neg & symptom persists... We reviewed the following medical problems during today's office visit >>     COPD> on Mucinex OTC but he won't stay on inhalers, etc; still smoking 1/2ppd but "trying to quit"; he denies recent URI or resp exac; he declines smoking cessation help...    AbnCXR> w/ calcif granuloma right base; f/u CXR 6/15 w/ pacer on left, normal heart size, clear lungs, NAD...    Cig Smoker> still smoking ~1/2ppd, offered smoking cessation help/ counseling/ but he's not interested; I begged him to quit (again)!    HBP> on Lisin20; not on approp salt restriction (reminded); BP= 138/78, & he denies CP/ palpit/ SOB/ edema/ etc...    Cardiac> AVB, Pacer> followed by DrTaylor; EKG 10/13 shows NSR, rate85, LBBB...    Carotid Art dis> on ASA81; last CDoppler 2009 w/ severe plaque on right but no signif ICAstenoses=> needs f/u but he cancelled Dopplers in 2010 &  2013...    Hyperlipid> on diet alone;  FLP 6/15 showed TChol 173, TG 173, HDL 29, LDL 109... rec better low fat diet & incr exercise!    GERD> on Prilosec20 OTC as needed...    Headache> he uses Goodies as needed... We reviewed prob list, meds, xrays and labs> see below for updates >> he notes that OTC B12 tabs are giving him some energy...   CXR 6/15 showed normal heart size, clear lungs, NAD, pacer ok...  LABS 6/15:  FLP- ok x TG/LDL not at goals;  Chems- ok x BS=133;  CBC- wnl;  TSH=0.67;  PSA=0.55;  Sed=9...   ~  December 04, 2014:  60mo ROV & Andrew Wiggins returns for yearly ROV, states breathing is OK, not on meds, still smoking ~1/2ppd; he denies resp exac over the past yr- notes AM cough, min sput, no discoloration or hemoptysis; same SOB/DOE w/ exertion eg-walking but he notes legs bother him more than breathing... His CC is left shoulder pain, denies trauma, OTC analgesics help & we have rec trial Tramadol & refer to Ortho for XRays and further eval... We reviewed the following medical problems during today's office visit >>     COPD> on Mucinex OTC prn but he won't stay on inhalers, etc; still smoking 1/2ppd; he denies recent URI or resp exac; he declines smoking cessation help.Marland KitchenMarland Kitchen  AbnCXR> w/ calcif granuloma right base; f/u CXR 7/16 w/ pacer on left, normal heart size, clear lungs, NAD...    Cig Smoker> still smoking ~1/2ppd, offered smoking cessation help/ counseling/ but he's not interested; I begged him to quit (again)!    HBP> on Lisin20; not on approp salt restriction (reminded); BP= 146/80, & he denies CP/ palpit/ SOB/ edema/ etc...    Cardiac> AVB, Pacer> followed by DrTaylor but last seen ?2011; EKG 10/13 shows NSR, rate85, LBBB; we will arrange for Cards f/u appt..    Carotid Art dis> on ASA81; last CDoppler 2009 w/ severe plaque on right but no signif ICAstenoses=> needs f/u but he cancelled Dopplers in 2010 & 2013...    Hyperlipid> on diet alone;  FLP 6/15 showed TChol 173, TG 173, HDL  29, LDL 109... rec better low fat diet & incr exercise!    GERD> on Prilosec20 OTC as needed...    Headache> he uses Goodies as needed... EXAM showed Afeb, VSS, O2sat=96% on RA;  Heent/neck- 1+right CBruit;  Chest- decr BS at bases, otherw clear;  Heart- RR, gr1/6 SEM w/o r/g;  Abd- sogt, neg;  Ext- neg w/o c/c/e... We reviewed prob list, meds, xrays and labs> see below for updates >>   CXR showed COPD changes, pacer ok, NAD...   Spirometry 12/04/14 was attempted (see graph) but predicted values not recorded on the test...  IMP/PLAN>>  Andrew Wiggins has refused to quit smoking and won't take inhalers; he has cancelled several attempts at f/u CDopplers; he has not followed up w/ Cards in 5 yrs or had pacer checked; we will arrange for f/u appt w/ Cards => DrTaylor 12/10/14 w/ Carotids;  We will arrange for Ortho referral for left shoulder;  ROV w/ me in 28mo sooner if needed prn...  ADDENDUM>>  CDoppler 12/10/14 showed mixed plaque bilat, incr in right carotid stenosis, same on left;  REC- contin ASA81 daily, QUIT ALL SMOKING, low chol/ low fat diet... Repeat CDoppler 538yr...  ~  June 07, 2015:  28mo ROV & Andrew Wiggins is still smoking ~1/2ppd; c/o sore throat, bronchitic cough, small amt whitish sput w/o blood; he notes ?low grade fever, no c/s, and SOB/DOE w/o change (pt still not exercising);  He is supposed to be on [id number] but it is too expensive & he hasn't refilled this med (reminded that 1/2ppd costs in $2-3 per day, ~$75/mo & he could use this for his co-pay)... We will change Rx to Advair250Bid & he is asked to get a copy of his Insurance co prescription drug formulary... We reviewed the above problems list... EXAM showed Afeb, VSS- BP=150/80 recheck, O2sat=98% on RA;  Heent/neck- 1+right CBruit;  Chest- decr BS at bases, otherw clear;  Heart- RR, gr1/6 SEM w/o r/g;  Abd- sogt, neg;  Ext- neg w/o c/c/e... IMP/PLAN>>  Andrew Wiggins needs to quit all smoking before it is too late but he is clearly NOT motivated  to do so & declines smoking cessation help; we will Rx w/ ZPak for prn use & switch to.[id number]; OK 2016 Flu vaccine today...   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 We reviewed the following medical problems during today's office visit >>     COPD> on Mucinex OTC prn but he won't stay on inhalers ?due to cost; still smoking but <1/2ppd now; he denies recent URI or resp exac; he declines smoking cessation help...    AbnCXR> w/ calcif granuloma right base; f/u CXR 7/17 w/ pacer on left, normal heart size, clear lungs, NAD...    Cig Smoker> still smoking <1/2ppd now, offered smoking cessation help/ counseling/ but he's not interested; I begged him to quit (again)!    HBP> on Lisin20 (med refilled); not on approp salt restriction (reminded); BP= 118/74, & he denies CP/ palpit/ SOB/ edema/ etc...    Cardiac> AVB, Pacer> followed by DrTaylor but last seen 8/16 (chart reviewed by me)- Hx CHB, PPM insertion, HBP; device functioning normally, no change in meds...    Carotid Art dis> on ASA81; last CDoppler 2009 w/ severe plaque on right but no signif ICAstenoses=> needs f/u but he cancelled Dopplers in 2010 & 2013...    Hyperlipid> on diet alone;  FLP 6/15 showed TChol 173, TG 173, HDL 29, LDL 109... rec better low fat diet & incr exercise! He is once again NOT fasting for labs today...    NEW= DM> blood sugar today = 199 (prev 104-133); we reviewed need for low carb diet & incr exercise & no smoking=> rec to start METFORMIN-ER 500mg  Qam...    GERD> on Prilosec20 OTC as needed...    Headache> he uses  Goodies as needed... EXAM showed Afeb, VSS, O2sat=98% on RA;  Heent/neck- 1+right CBruit;  Chest- decr BS at bases, otherw clear;  Heart- RR, gr1/6 SEM w/o r/g;  Abd- sogt, neg;  Ext- neg w/o c/c/e; Neuro intact w/o focal abn x R-CBruit...  CXR 12/06/15 (independently reviewed by me in the PACS system) showed norm heart size, hyperinflated lungs c/w COPD but clear 7 NAD, pacemaker on left...  Spirometry 12/06/15 (independently interpreted by me) FVC=3.27 (71%), FEV1=2.32 (67%), %1sec=71, mid-flows reduced at 54% predicted;  This represents mild airflow obstruction & GOLD Stage2 COPD (can't r/o mild superimposed restriction)...  LABS 12/06/15>  Chems- ok x BS=199;  CBC- wnl;  TSH=1.33;  PSA=0.59... IMP/PLAN>>  Andrew Wiggins has some underlying COPD & desperately needs to quit smoking; rec to start [id number] but unable to afford w/o insurance coverage- we discussed the obvious to quit smoking & save the money towards needed meds but he is not impressed; he will look into PartD coverage... we plan ROV in 878mo w/ labs and A1c...  ~  June 07, 2016:  108mo ROV & when Andrew Wiggins was here last we 1) begged him to quit smoking due to his mild COPD & his inability to afford inhalers; and 2) started Metformin500Qam for his DM w/ BS=199 in July2017; he was asked to f/u in 878mo but never made this appt and comes in today for 108mo ROV but he STOPPED the Metformin severasl months ago => he tells me that [facility] Oct[facility] w/ diarrhea and LLQ pain, he says a scan showed pancreatitis, they stopped the Metformin, he was NOT admitted, placed on a liq diet & his symptoms resolved=> he did not see a gastroenterologist, did not call our office for follow up, and we never received any records from [facility]... we reviewed the following medical problems during today's office visit >>     COPD>  on Mucinex OTC prn but he won't stay on inhalers ?due to cost; still smoking but ~1/2ppd now; he denies recent URI or resp  exac; he declines smoking cessation help...    AbnCXR> w/ calcif granuloma right base; f/u CXR 7/17 w/ pacer on left, normal heart size, clear lungs, NAD...    Cig Smoker> still smoking ~1/2ppd now, offered smoking cessation help/ counseling/ but he's not interested; I begged him to quit (again)!    HBP> on Lisin20 (med refilled); not on approp salt restriction (reminded); BP= 140/78, & he denies CP/ palpit/ SOB/ edema/ etc...    Cardiac> AVB, Pacer> followed by DrTaylor but last seen 8/16 (chart reviewed by me)- Hx CHB, PPM insertion, HBP; device functioning normally, no change in meds, asked to f/u 5523yr...    Carotid Art dis> on ASA81; he cancelled CDopplers in 2010 & 2013; last CDoppler 12/2014 showed mixed plaque bilat, worsening right stenosis at 40-59%, similar left stenosis at 1-39%, & rec to take ASA, quit smoking, contol BP, Chol, BS to help prevent stroke...    Hyperlipid> on diet alone;  FLP 6/15 showed TChol 173, TG 173, HDL 29, LDL 109... rec better low fat diet & incr exercise! Repeat FLP 1/18 shows TChol 209, TG 246, HDL 30, LDL 110=> offered meds he declines wants diet alone    DM> blood sugar 7/17 = 199 (prev 104-133); we reviewed need for low carb diet & incr exercise & no smoking=>started on Metform500Qam, but he stopped 02/2016 w/ [facility] visit for diarrhea/ LLQ pain and said to have pancreatitis- he was not adm, we do not have any records, he was not seen by GI, and never called us for follow up here; LABS today 1/18 shows BS=279, A1c=10.9 & he is referred to Endocrine-DM for ASAP consultation...     GERD> on Prilosec20 OTC as needed...    Headache> he uses Goodies as needed... EXAM showed Afeb, VSS, O2sat=97% on RA;  Heent/neck- 1+right CBruit;  Chest- decr BS at bases, otherw clear;  Heart- RR, gr1/6 SEM w/o r/g;  Abd- sogt, neg;  Ext- neg w/o c/c/e; Neuro intact w/o focal abn x R-CBruit...  LABS 06/07/16>  FLP- not at goals on diet alone, refuses med rx;  Chems- ok x BS=279,  A1c=10.9 => needs Diabetic specialist (refer to DrEllison);   TSH=1.20;  CBC- wnl... IMP/PLAN>>  Andrew Wiggins is a walking time-bomb medically- smoker w/ COPD & abnCXR, known HBP, heart dis/pacer, HL, DM, and hx poor compliance w/ medical therapy & my recommendations for his care; he can't vs won't quit smoking, needs f/u w/ Cards-DrTaylor, needs statin added to regimen (declines), needs Endocrine-DM consult for on-going management of his DM => we will refer to [facility] ASAP...  ~  December 20, 2016:  6-4015mo ROV & at our last visit Andrew Wiggins's BS=279, A1c=10.9 & he was referred to [facility]/ DM but he never went for the appt- says it was due to a death in the family & never called to reschedule;  He is now c/o neuropathic symptoms in his legs, esp Qhs, & we reviewed the importance of tight DM control, need for DM consult & management, consideration of Gabapentin medication, etc;  Also reminded of the CV risk & need for f/u by CARDS (prev getting phone monitoring by DrTaylor but pt stopped this on his own...  We reviewed the following medical problems during today's office visit >>     COPD, GOLD 2> on Mucinex OTC prn but he won't  stay on inhalers ?due to cost; still smoking but ~1/2ppd now; he denies recent URI or resp exac; he declines smoking cessation help, refuses regular inhaler meds...    AbnCXR> w/ calcif granuloma right base; f/u CXR 8/18 w/ pacer on left, normal heart size, clear lungs, NAD...    Cig Smoker> still smoking ~1/2ppd now, offered smoking cessation help/ counseling/ but he's not interested; I begged him to quit (again)!    HBP> on Lisin20 (med refilled); not on approp salt restriction (reminded); BP= 160/80, & he denies CP/ palpit/ SOB/ edema/ etc; reminded to take med daily, elim sodium, monitor BP at home & call for issues...    Cardiac> AVB, Pacer> followed by DrTaylor but last seen 8/16 (chart reviewed by me)- Hx CHB, PPM insertion, HBP; device functioning normally, no change in meds,  asked to f/u 36yr...    Carotid Art dis> on ASA81; he cancelled CDopplers in 2010 & 2013; last CDoppler 12/2014 showed mixed plaque bilat, worsening right stenosis at 40-59%, similar left stenosis at 1-39%, & rec to take ASA, quit smoking, contol BP, Chol, BS to help prevent stroke...    Hyperlipid> on diet alone;  FLP 1/18 showed TChol 209, TG 246, HDL 30, LDL 110... rec better low fat diet & increase exercise, offered meds he declines wants diet alone...    DM> blood sugar 7/17 = 199 (prev 104-133); we reviewed need for low carb diet & incr exercise & no smoking=>started on Metform500Qam, but he stopped 02/2016 w/ [facility] visit for diarrhea/ LLQ pain and said to have pancreatitis- he was not adm, we do not have any records, he was not seen by GI, and never called [place] for follow up here; LABS 1/18 shows BS=279, A1c=10.9 & he is referred to Endocrine-DM but he cancelled & never resched...    GERD> on Prilosec20 OTC as needed...    Headache> he uses Goodies as needed... EXAM showed Afeb, VSS, O2sat=96% on RA;  Heent/neck- 1+right CBruit;  Chest- decr BS at bases, otherw clear;  Heart- RR, gr1/6 SEM w/o r/g;  Abd- soft, neg;  Ext- neg w/o c/c/e; Neuro intact w/o focal abn...  CXR 12/20/16 (independently reviewed by me in the PACS system) showed norm heart size & vascularity, pacer unchanged, lungs clear w/ sl hyperinflation- NAD...  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    ~  July 04, 2017:  78mo ROV    We reviewed the following interval medical visits since he was last here>       He continues to have regularly sched remote pacer checks by Cards- DrTaylor...     He saw CARDS- DrTaylor on 01/25/17>  HBP, CHB- s/p pacer, still smoking,  denies CP/ SOB/ edema; on ASA & Lisin20- doing satis x "white coat" HBP & asked to continue BP chacks at home to be sure he is well controlled...     He saw ENDOCRINE- DrKumar 02/07/17>  DM2 & mixed hyperlipidemia, early PN symptoms; intol Metform due to diarrhea, not following diet, not checking sugars, not exercising; on Actos15 + Glimep4 but hasn't followed up w/ Endocrine since then...  We reviewed the following medical problems during today's office visit>      COPD, GOLD 2> on Mucinex OTC prn but he won't stay on inhalers ?due to cost; still smoking but ~1/2ppd now; he denies recent URI or resp exac; he declines smoking cessation help, refuses regular inhaler meds...    AbnCXR> w/ calcif granuloma right base; f/u CXR 8/18 w/ pacer on left, normal heart size, clear lungs, NAD...    Cig Smoker> still smoking ~1/2ppd now, offered smoking cessation help/ counseling/ but he's not interested; I begged him to quit (again)!    HBP> on Lisin20 (med refilled); not on approp salt restriction (reminded); BP= 146/82, & he denies CP/ palpit/ SOB/ edema/ etc; reminded to take med daily, elim sodium, monitor BP at home & call for issues...    Cardiac> AVB, Pacer> followed by DrTaylor & last seen 9/18 (chart reviewed by me)- Hx CHB, PPM insertion, HBP; device functioning normally, no change in meds, asked to f/u 75yr...    Carotid Art dis> on ASA81; he cancelled CDopplers in 2010 & 2013; last CDoppler 12/2014 showed mixed plaque bilat, worsening right  stenosis at 40-59%, similar left stenosis at 1-39%, & rec to take ASA, quit smoking, contol BP, Chol, BS to help prevent stroke...    Hyperlipid> on diet alone;  FLP 1/18 showed TChol 209, TG 246, HDL 30, LDL 110... rec better low fat diet & increase exercise, offered meds he declines wants diet alone...    DM> blood sugar 7/17 = 199 (prev 104-133); we reviewed need for low carb diet & incr exercise & no smoking=>started on Metform500Qam, but he stopped 02/2016 w/ [facility] visit for diarrhea/ LLQ pain and said to have pancreatitis- he was not adm, we do not have any records, he was not seen by GI, and never called [place] for follow up here; LABS 1/18 shows BS=279, A1c=10.9 & he is referred to Endocrine-DM but he cancelled & never resched; he finally saw DrKumar 02/2017 w/ Actos15 added to the [facility] but he never followed up & stopped the Actos on his own; Labs 07/04/17 on diet + Glim4 showed BS=183, A1c=7.5 & we discussed trial MetformER-500mg /d    GERD> on Prilosec20 OTC as needed...    Headache> he uses Goodies as needed... EXAM showed Afeb, VSS, O2sat=98% on RA;  Heent/neck- 1+right CBruit;  Chest- decr BS at bases, otherw clear;  Heart- RR, gr1/6 SEM w/o r/g;  Abd- soft, neg;  Ext- neg w/o c/c/e; Neuro intact w/o focal abn...  LABS 07/04/17>  BMet- BS=183, K-4.3, Cr=0.84;  A1c=7.5.Marland KitchenMarland Kitchen IMP/PLAN>>  He refused to stay on the Actos (?why- generic is only $9 on GoodRx) & we discussed options- continue glimep4 & agrees to trial METFORM-ER500 one daily and he is instructed to call me w/ any problems to pursue alternatives if necessary...          PROBLEM LIST:    CHRONIC OBSTRUCTIVE PULMONARY DISEASE (ICD-496) - hx recurrent bronchitic episodes in the past... smokes 1/2 - 1ppd x yrs... he notes mild cough, occas sputum, denies SOB... He will  not stay on inhaled Rx... ~  PFT's 11/08 showed FVC= 3.85 (79%), FEV1= 2.19 (55%), %1sec= 57, mid-flows= 32% predicted:  all c/w mod airflow obstruction...   Hx of  ABNORMAL CHEST XRAY (ICD-793.1) - prev film w/ sm calcif granuloma right base, otherw neg...  ~  CXR 1/10 showed pacer placed, clear lungs, NAD.[name]. ~  CXR 12/10 showed Pacer on left, clear lungs & mild hyperinflation, NAD.[name]. ~  CXR 10/13 showed normal heart size, pacer on left, clear lungs, NAD.[name]. ~  CXR 6/15 showed normal heart size, clear lungs, NAD, pacer ok... ~  CXR 7/16 showed COPD changes, pacer ok, NAD...  CIGARETTE SMOKER (ICD-305.1) - 30+ yrs of >1ppd smoking... Now down to 1/2 ppd... we have discussed smoking cessation on numerous occas & reviewed options again today... He continues to decline smoking cessation help...  HYPERTENSION (ICD-401.9) - hx HBP on LISINOPRIL 20mg /d...  ~  2DEcho 11/09 showed sl incr LVwall thickness, norm LVF w/ EF= 55-60%, no regional wall motion abn, mild DD, mild MR... ~  3/12:  BP= 140/80 & denies fatigue, visual changes, CP, palipit, syncope, dyspnea, edema, etc... Changed to Lisinopril20. ~  10/13:  BP= 164/88 but he ran out of Lisin20 3d ago; he denies CP, palpit, dizzy, SOB, edema... ~  4/14:  on Lisin20; not on approp salt restriction; BP= 130/90, & he denies CP/ palpit/ SOB/ edema/ etc. ~  6/15:  on Lisin20; not on approp salt restriction (reminded); BP= 138/78, & he denies CP/ palpit/ SOB/ edema/ etc... ~  7/16: on Lisin20; BP=146/80 & he remains asymptomatic, reminded to take med every day & elim salt...  CHEST WALL PAIN, HX OF (ICD-V15.89) ~  NuclearStressTest 12/09 = atypic Cp, LBBB, no perfusion defects, EF= 50% w/ abn septal motion.  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  ~  EKG 10/13 showed NSR, rate85, LBBB, NAD.  CAROTID ARTERY DISEASE >> on ASA 81mg /d... exam 11/09 shows gr 1-2 sys murmur LSB, but also has prominent bruit to base of right side of his neck w/ right > left Carotid Bruit... ~  CDopplers 11/09 showed severe plaque right ICA, & min plaque on left... no signif ICA  stenoses per report. ~  f/u CDopplers 12/10 were ordered but pt never had these done... ~  f/u CDopplers 11/13 ==> pending (pt cancelled) ~  Pt has declined f/u CDopplers... ~  7/16: we arranged to f/u CDopplers and ROV w/ DrTaylor 12/10/14...  R/O PERIPHERAL ARTERIAL DISEASE >> presented 10/13 w/ leg pain on ambulation... ~  11/13:  ArtDopplers & ABIs ==> normal  HYPERLIPIDEMIA >>  ~  FLP 10/13 on diet alone showed TChol 194, TG 272, HDL 30, LDL 119... We reviewed low fat diet needed...  ~  FLP 6/15 on diet alone showed TChol 173, TG 173, HDL 29, LDL 109... Needs better low fat diet & incr exercise...  DIABETES MELLITUS >>  ~  Labs 11/2015 showed BS199 (prev 104-133) and he was started on Metformin500Qam; he did not return as requested to f/u efficacy... ~  He tells me he presented to [facility] ER [id number] w/ diarrhea & LLQ pain; told he had pancreatitis & says he was told to stop the Metformin; he was not admitted- did not consult w/ Endocrine/ GI/ etc- and we were never sent notification or records, and pt did not call our office for f/u visit! ~  Pt returned 06/07/16 for ROV on diet alone &  Labs showed BS=279, A1c=10.9, and he is being referred to Endocrine-DM ASAP for consultation and DM management...   GERD (ICD-530.81) - prev mild GERD symptoms that improved after cholecystectomy on 2000... uses OTC PPI as needed... ~  he is due for routine screening colonoscopy but he refuses to schedule this important screening procedure...  HEADACHE (ICD-784.0) - long hx of intermittent HA's and had neuro eval by DrStiefel in the 80's thought to be mixed muscle contraction & migraines... ~  He uses Goodie's powders as needed...  CONTACT DERMATITIS DUE TO POISON IVY (ICD-692.6)  Heath Maintenance:  OK Flu shot & Pneumovax 12/10...   Past Medical History:  Diagnosis Date  . Abnormal chest x-ray   . AV block    history of  . Cardiac pacemaker in situ   . Chest wall pain   . COPD (chronic  obstructive pulmonary disease) (HCC)   . Dermatitis, contact    due to poison ivy  . GERD (gastroesophageal reflux disease)   . Headache(784.0)   . Hypertension   . Peripheral vascular disease (HCC)   . Tobacco use disorder     Past Surgical History:  Procedure Laterality Date  . laproscopic cholecystectomy  2000   by Dr. Odie Sera  . PACEMAKER INSERTION  03/2008   by Dr. Ladona Ridgel    Outpatient Encounter Medications as of 07/04/2017  Medication Sig  . aspirin 81 MG tablet Take 81 mg by mouth daily.    . Aspirin-Acetaminophen-Caffeine (GOODY HEADACHE PO) Take 1 packet by mouth daily as needed for headaches  . glimepiride (AMARYL) 4 MG tablet Take 1 tablet (4 mg total) by mouth daily with breakfast.  . lisinopril (PRINIVIL,ZESTRIL) 20 MG tablet Take 1 tablet (20 mg total) daily by mouth.  . pioglitazone (ACTOS) 15 MG tablet Take 1 tablet (15 mg total) by mouth daily. (Patient not taking: Reported on 07/04/2017)   No facility-administered encounter medications on file as of 07/04/2017.     No Known Allergies   Immunization History  Administered Date(s) Administered  . Influenza Whole 04/15/2009  . Influenza,inj,Quad PF,6+ Mos 06/07/2015, 06/07/2016  . Pneumococcal Polysaccharide-23 04/15/2009    Current Medications, Allergies, Past Medical History, Past Surgical History, Family History, and Social History were reviewed in [employer] record.   Review of Systems    Constitutional:  Denies F/C/S, anorexia, unexpected weight change. He is c/o night sweats. HEENT:  No HA, visual changes, earache, nasal symptoms, sore throat, hoarseness. Resp:  min cough & sputum; no hemoptysis; no SOB, tightness, wheezing. Cardio:  No CP, palpit, DOE, orthopnea, edema. GI:  Denies N/V/D/C or blood in stool; no reflux, abd pain, distention, or gas. GU:  No dysuria, freq, urgency, hematuria, or flank pain. MS:  Denies joint pain, swelling, tenderness, or decr ROM; no neck  pain, back pain, etc. Neuro:  No tremors, seizures, dizziness, syncope, weakness, numbness, gait abn. Skin:  No suspicious lesions or skin rash. Heme:  No adenopathy, bruising, bleeding. Psyche: Denies confusion, sleep disturbance, hallucinations, anxiety, depression.    Objective:   Physical Exam   WD, WN, 62 y/o WM in NAD... Vital Signs:  Reviewed... BP recheck 142/ 80... General:  Alert & oriented; pleasant & cooperative... HEENT:  [place]/AT, EOM-wnl, PERRLA, Fundi-benign, EACs-clear, TMs-wnl, NOSE-clear, THROAT-clear & wnl.  Neck:  Supple w/ full ROM; no JVD; normal carotid impulses & faint right neck bruit; no thyromegaly or nodules palpated; no lymphadenopathy.  Chest:  Clear to P & A; without wheezes/ rales/ or rhonchi  heard... Heart:  Regular Rhythm; pacer palpated in left upper chest wall, norm S1 & S2 without murmurs/ rubs/ or gallops detected... Abdomen:  Soft & nontender; normal bowel sounds; no organomegaly or masses palpated... Ext:  Normal ROM; without deformities or arthritic changes; no varicose veins, venous insuffic, or edema... Derm:  No lesions noted; no rash etc... Scar on bridge of nose from prev skin ca... Lymph:  No cervical, supraclavicular, axillary, or inguinal adenopathy palpated...    Assessment & Plan:    06/07/16>   Caetano is a walking time-bomb medically- smoker w/ COPD & abnCXR, known HBP, heart dis/pacer, HL, DM, and hx poor compliance w/ medical therapy & my recommendations for his care; he can't vs won't quit smoking, needs f/u w/ Cards-DrTaylor, needs statin added to regimen (declines), needs Endocrine-DM consult for on-going management of his DM => we will refer to [facility] ASAP. 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 07/04/17>   He refused to stay on the Actos (?why- generic is only $9 on GoodRx) & we discussed options- continue glimep4 & agrees to trial METFORM-ER500 one daily and he is instructed to call me w/ any problems to pursue alternatives if necessary   Pulm> COPD, AbnCXR, CigSmoker> CXRs w/o acute changes; must quit all smoking but he is not inclined toward counseling etc...  HBP>  BP is controlled on Lisin20- reminded to take it every day!  Cardiac> LBBB, Pacer>  Followed by DrTaylor & due for f/u visit & pacer check...  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  HYPERLIPID>  FLP not controlled on diet alone, esp w/ mult coronary risk factors; I have advised STATIN therapy but he declines, wants diet alone...  NEW => DM w/ FBS 11/2015 = 199 & we reviewed diet, exercise, start METFORMIN-ER 500mg  Qam=> this was stopped 02/2016 by [facility] & pt never followed up w/ me... ~  Pt returned 06/07/16 for ROV on diet alone & Labs showed BS=279, A1c=10.9, and he is being referred to Endocrine-DM ASAP for consultation and DM management...   PAD>  Presented 10/13 w/ LE pain on ambulation; Art dopplers ==> wnl...  GERD> min symptoms and uses OTC Prilosec as needed...  Need for baseline Colonoscopy>  He hasn't yet done his baseline colonoscopy & reminded again...  Headaches>  Uses Goodies as needed...   Patient's Medications  New Prescriptions   No medications on file  Previous  Medications   ASPIRIN 81 MG TABLET    Take 81 mg by mouth daily.     ASPIRIN-ACETAMINOPHEN-CAFFEINE (GOODY HEADACHE PO)    Take 1 packet by mouth daily as needed for headaches   GLIMEPIRIDE (AMARYL) 4 MG TABLET  Take 1 tablet (4 mg total) by mouth daily with breakfast.   LISINOPRIL (PRINIVIL,ZESTRIL) 20 MG TABLET    Take 1 tablet (20 mg total) daily by mouth.   PIOGLITAZONE (ACTOS) 15 MG TABLET    Take 1 tablet (15 mg total) by mouth daily.  Modified Medications   No medications on file  Discontinued Medications   No medications on file

## 2017-07-04 NOTE — Patient Instructions (Signed)
Today we updated your med list in our EPIC system...    Continue your current medications the same...  We discussed checking your diabetic labs today on the GLIMEPIRIDE 4mg  each AM + your diet & exercise plan...    We will contact you w/ the results when available and then decide how to proceed...  Dude, you need to QUIT THE SMOKING & save the $$$...  Call for any questions...  Let's plan a follow up visit in [phone], sooner if needed for problems.[name]..[name]

## 2017-07-06 MED ORDER — METFORMIN HCL ER 500 MG PO TB24: 500.0000 mg | ORAL_TABLET | Freq: Every day | ORAL | 0 refills | Status: DC

## 2017-07-26 ENCOUNTER — Encounter: Payer: PPO | Admitting: *Deleted

## 2017-07-26 ENCOUNTER — Telehealth: Payer: Self-pay | Admitting: Cardiology

## 2017-07-26 NOTE — Telephone Encounter (Signed)
LMOVM reminding pt to send remote transmission.   

## 2017-07-27 ENCOUNTER — Encounter: Payer: Self-pay | Admitting: Cardiology

## 2017-08-06 ENCOUNTER — Telehealth: Payer: Self-pay | Admitting: Pulmonary Disease

## 2017-08-06 DIAGNOSIS — F17210 Nicotine dependence, cigarettes, uncomplicated: Secondary | ICD-10-CM | POA: Diagnosis not present

## 2017-08-06 DIAGNOSIS — J189 Pneumonia, unspecified organism: Secondary | ICD-10-CM | POA: Diagnosis not present

## 2017-08-06 DIAGNOSIS — E1165 Type 2 diabetes mellitus with hyperglycemia: Secondary | ICD-10-CM | POA: Diagnosis not present

## 2017-08-06 DIAGNOSIS — M791 Myalgia, unspecified site: Secondary | ICD-10-CM | POA: Diagnosis not present

## 2017-08-06 DIAGNOSIS — R05 Cough: Secondary | ICD-10-CM | POA: Diagnosis not present

## 2017-08-06 NOTE — Telephone Encounter (Signed)
  Spoke with patient. He is wondering if he needs a refill on his metformin. Metformin 500mg  XR was called in for him on 07/06/17 with no refills.   He also wanted to know if he should be taking the glimepiride 4mg  as well.   He wishes to use [facility] in [place].   SN, please advise. Thanks!

## 2017-08-07 ENCOUNTER — Other Ambulatory Visit: Payer: Self-pay | Admitting: Pulmonary Disease

## 2017-08-07 ENCOUNTER — Ambulatory Visit (INDEPENDENT_AMBULATORY_CARE_PROVIDER_SITE_OTHER): Payer: PPO | Admitting: *Deleted

## 2017-08-07 DIAGNOSIS — I442 Atrioventricular block, complete: Secondary | ICD-10-CM

## 2017-08-07 MED ORDER — GLIMEPIRIDE 4 MG PO TABS: 4.0000 mg | ORAL_TABLET | Freq: Every day | ORAL | 2 refills | Status: DC

## 2017-08-07 MED ORDER — METFORMIN HCL ER 500 MG PO TB24: 500.0000 mg | ORAL_TABLET | Freq: Every day | ORAL | 2 refills | Status: DC

## 2017-08-07 NOTE — Telephone Encounter (Signed)
ATC patient, but mailbox is full and I am unable to leave message.  Will try again later today.

## 2017-08-07 NOTE — Addendum Note (Signed)
Addended by: Jacquiline DoeROGDON, Janie Capp M on: 08/07/2017 12:48 PM   Modules accepted: Orders

## 2017-08-07 NOTE — Telephone Encounter (Signed)
Per SN- OK to refill Metformin XR 500mg . Last OV BS was 183 and A1C 7.5 on glimepiride alone. Was not taking Actos, so it can be removed from his med list.  He is to take the glimepiride 4mg  as well as the metformin 500mg .Actos removed from med list and Met[place] [facility].

## 2017-08-07 NOTE — Progress Notes (Signed)
Remote pacemaker transmission.   

## 2017-08-07 NOTE — Telephone Encounter (Signed)
Spoke with Patient about Metformin and SN recommendations. Patient stated he understood that he is to take the Metformin and he is to remain on the glimepiride. He also stated he needed a refill on glimepiride. Glimepiride 4mg   refill sent to [facility] in [place]. Nothing further needed.

## 2017-08-08 ENCOUNTER — Encounter: Payer: Self-pay | Admitting: Cardiology

## 2017-08-14 LAB — CUP PACEART REMOTE DEVICE CHECK
Battery Impedance: 1167 Ohm
Battery Voltage: 2.78 V
Brady Statistic AP VP Percent: 4 %
Brady Statistic AP VS Percent: 0 %
Brady Statistic AS VS Percent: 0 %
Implantable Lead Implant Date: 20091120
Implantable Lead Location: 753859
Implantable Lead Location: 753860
Implantable Lead Model: 5076
Implantable Lead Model: 5076
Lead Channel Impedance Value: 1111 Ohm
Lead Channel Impedance Value: 519 Ohm
Lead Channel Pacing Threshold Pulse Width: 0.4 ms
Lead Channel Setting Pacing Amplitude: 1.5 V
Lead Channel Setting Pacing Amplitude: 2 V
Lead Channel Setting Pacing Pulse Width: 0.4 ms
MDC IDC LEAD IMPLANT DT: 20091120
MDC IDC MSMT BATTERY REMAINING LONGEVITY: 63 mo
MDC IDC MSMT LEADCHNL RA PACING THRESHOLD AMPLITUDE: 0.625 V
MDC IDC MSMT LEADCHNL RV PACING THRESHOLD AMPLITUDE: 0.625 V
MDC IDC MSMT LEADCHNL RV PACING THRESHOLD PULSEWIDTH: 0.4 ms
MDC IDC PG IMPLANT DT: 20091120
MDC IDC SESS DTM: 20190402150739
MDC IDC SET LEADCHNL RV SENSING SENSITIVITY: 5.6 mV
MDC IDC STAT BRADY AS VP PERCENT: 96 %

## 2017-08-15 ENCOUNTER — Telehealth: Payer: Self-pay | Admitting: Pulmonary Disease

## 2017-08-15 ENCOUNTER — Other Ambulatory Visit: Payer: Self-pay | Admitting: Pulmonary Disease

## 2017-08-15 MED ORDER — HYDROCODONE-HOMATROPINE 5-1.5 MG/5ML PO SYRP: 5.0000 mL | ORAL_SOLUTION | Freq: Four times a day (QID) | ORAL | 0 refills | Status: DC | PRN

## 2017-08-15 NOTE — Telephone Encounter (Signed)
Per SN- Hycodan 6oz take 1 teaspoon by mouth every 6 hours as needed for cough. Called and spoke with Patient about prescription and recommendations about stop smoking and smoking cessation. Patient stated understanding. Prescription placed up front for pick up.  Nothing further needed at this time.

## 2017-08-15 NOTE — Telephone Encounter (Signed)
Spoke with the pt  He is c/o increased cough x 1 wk Cough is prod with white sputum  He has HA and "head all gummed up"- clear nasal d/c He states that he has also had some soreness in his chest from coughing No wheezing, increased SOB, f/c/s He is requesting that SN call in something  He has not tried anything OTC b/c "those meds make me feel funny"  Please advise thanks!  Current Outpatient Medications on File Prior to Visit  Medication Sig Dispense Refill  . aspirin 81 MG tablet Take 81 mg by mouth daily.      . Aspirin-Acetaminophen-Caffeine (GOODY HEADACHE PO) Take 1 packet by mouth daily as needed for headaches    . glimepiride (AMARYL) 4 MG tablet Take 1 tablet (4 mg total) by mouth daily with breakfast. 30 tablet 2  . lisinopril (PRINIVIL,ZESTRIL) 20 MG tablet Take 1 tablet (20 mg total) daily by mouth. 90 tablet 1  . metFORMIN (GLUCOPHAGE-XR) 500 MG 24 hr tablet Take 1 tablet (500 mg total) by mouth daily with breakfast. 30 tablet 2   No current facility-administered medications on file prior to visit.    No Known Allergies

## 2017-09-05 ENCOUNTER — Other Ambulatory Visit: Payer: Self-pay | Admitting: Pulmonary Disease

## 2017-11-06 ENCOUNTER — Telehealth: Payer: Self-pay | Admitting: Cardiology

## 2017-11-06 ENCOUNTER — Encounter: Payer: PPO | Admitting: *Deleted

## 2017-11-06 NOTE — Telephone Encounter (Signed)
LMOVM reminding pt to send remote transmission.   

## 2017-11-07 ENCOUNTER — Encounter: Payer: Self-pay | Admitting: Cardiology

## 2017-11-09 ENCOUNTER — Ambulatory Visit (INDEPENDENT_AMBULATORY_CARE_PROVIDER_SITE_OTHER): Payer: PPO | Admitting: *Deleted

## 2017-11-09 DIAGNOSIS — I442 Atrioventricular block, complete: Secondary | ICD-10-CM

## 2017-11-12 ENCOUNTER — Telehealth: Payer: Self-pay | Admitting: Pulmonary Disease

## 2017-11-12 NOTE — Telephone Encounter (Signed)
lmtcb for pt.  

## 2017-11-12 NOTE — Progress Notes (Signed)
Remote pacemaker transmission.   

## 2017-11-13 MED ORDER — METFORMIN HCL ER 500 MG PO TB24: 500.0000 mg | ORAL_TABLET | Freq: Every day | ORAL | 2 refills | Status: DC

## 2017-11-13 MED ORDER — GLIMEPIRIDE 4 MG PO TABS: 4.0000 mg | ORAL_TABLET | Freq: Every day | ORAL | 2 refills | Status: DC

## 2017-11-13 NOTE — Telephone Encounter (Signed)
Spoke with pt. He is needing refills on Glimepiride and Metformin. Rxs have been sent in. Nothing further was needed.

## 2017-11-14 ENCOUNTER — Encounter: Payer: Self-pay | Admitting: Cardiology

## 2017-12-03 ENCOUNTER — Ambulatory Visit: Payer: PPO | Admitting: Pulmonary Disease

## 2017-12-04 LAB — CUP PACEART REMOTE DEVICE CHECK
Battery Impedance: 1193 Ohm
Brady Statistic AP VS Percent: 0 %
Brady Statistic AS VS Percent: 0 %
Date Time Interrogation Session: 20190705163225
Implantable Lead Implant Date: 20091120
Implantable Lead Implant Date: 20091120
Implantable Lead Location: 753859
Implantable Lead Location: 753860
Implantable Lead Model: 5076
Lead Channel Impedance Value: 1071 Ohm
Lead Channel Impedance Value: 476 Ohm
Lead Channel Sensing Intrinsic Amplitude: 2.8 mV
Lead Channel Setting Pacing Amplitude: 1.5 V
Lead Channel Setting Pacing Amplitude: 2 V
Lead Channel Setting Pacing Pulse Width: 0.4 ms
MDC IDC MSMT BATTERY REMAINING LONGEVITY: 62 mo
MDC IDC MSMT BATTERY VOLTAGE: 2.78 V
MDC IDC MSMT LEADCHNL RA PACING THRESHOLD AMPLITUDE: 0.625 V
MDC IDC MSMT LEADCHNL RA PACING THRESHOLD PULSEWIDTH: 0.4 ms
MDC IDC MSMT LEADCHNL RV PACING THRESHOLD AMPLITUDE: 0.5 V
MDC IDC MSMT LEADCHNL RV PACING THRESHOLD PULSEWIDTH: 0.4 ms
MDC IDC PG IMPLANT DT: 20091120
MDC IDC SET LEADCHNL RV SENSING SENSITIVITY: 5.6 mV
MDC IDC STAT BRADY AP VP PERCENT: 3 %
MDC IDC STAT BRADY AS VP PERCENT: 97 %

## 2017-12-20 ENCOUNTER — Other Ambulatory Visit (INDEPENDENT_AMBULATORY_CARE_PROVIDER_SITE_OTHER): Payer: PPO

## 2017-12-20 ENCOUNTER — Ambulatory Visit (INDEPENDENT_AMBULATORY_CARE_PROVIDER_SITE_OTHER): Payer: PPO | Admitting: Pulmonary Disease

## 2017-12-20 ENCOUNTER — Encounter: Payer: Self-pay | Admitting: Pulmonary Disease

## 2017-12-20 VITALS — BP 144/82 | HR 103 | Temp 98.1°F | Ht 68.75 in | Wt 147.2 lb

## 2017-12-20 DIAGNOSIS — I10 Essential (primary) hypertension: Secondary | ICD-10-CM

## 2017-12-20 DIAGNOSIS — E785 Hyperlipidemia, unspecified: Secondary | ICD-10-CM

## 2017-12-20 DIAGNOSIS — I739 Peripheral vascular disease, unspecified: Secondary | ICD-10-CM

## 2017-12-20 DIAGNOSIS — J4489 Other specified chronic obstructive pulmonary disease: Secondary | ICD-10-CM

## 2017-12-20 DIAGNOSIS — I779 Disorder of arteries and arterioles, unspecified: Secondary | ICD-10-CM

## 2017-12-20 DIAGNOSIS — Z0000 Encounter for general adult medical examination without abnormal findings: Secondary | ICD-10-CM

## 2017-12-20 DIAGNOSIS — Z950 Presence of cardiac pacemaker: Secondary | ICD-10-CM

## 2017-12-20 DIAGNOSIS — J449 Chronic obstructive pulmonary disease, unspecified: Secondary | ICD-10-CM

## 2017-12-20 DIAGNOSIS — R7309 Other abnormal glucose: Secondary | ICD-10-CM

## 2017-12-20 DIAGNOSIS — E0840 Diabetes mellitus due to underlying condition with diabetic neuropathy, unspecified: Secondary | ICD-10-CM | POA: Diagnosis not present

## 2017-12-20 DIAGNOSIS — I459 Conduction disorder, unspecified: Secondary | ICD-10-CM

## 2017-12-20 DIAGNOSIS — F17200 Nicotine dependence, unspecified, uncomplicated: Secondary | ICD-10-CM

## 2017-12-20 LAB — CBC WITH DIFFERENTIAL/PLATELET
BASOS ABS: 0 10*3/uL (ref 0.0–0.1)
Basophils Relative: 0.3 % (ref 0.0–3.0)
EOS ABS: 0.1 10*3/uL (ref 0.0–0.7)
Eosinophils Relative: 0.9 % (ref 0.0–5.0)
HEMATOCRIT: 50.6 % (ref 39.0–52.0)
Hemoglobin: 17.2 g/dL — ABNORMAL HIGH (ref 13.0–17.0)
LYMPHS ABS: 3.6 10*3/uL (ref 0.7–4.0)
LYMPHS PCT: 33.1 % (ref 12.0–46.0)
MCHC: 34 g/dL (ref 30.0–36.0)
MCV: 93.9 fl (ref 78.0–100.0)
Monocytes Absolute: 0.8 10*3/uL (ref 0.1–1.0)
Monocytes Relative: 7.6 % (ref 3.0–12.0)
NEUTROS ABS: 6.2 10*3/uL (ref 1.4–7.7)
NEUTROS PCT: 58.1 % (ref 43.0–77.0)
PLATELETS: 166 10*3/uL (ref 150.0–400.0)
RBC: 5.39 Mil/uL (ref 4.22–5.81)
RDW: 14.4 % (ref 11.5–15.5)
WBC: 10.8 10*3/uL — ABNORMAL HIGH (ref 4.0–10.5)

## 2017-12-20 LAB — COMPREHENSIVE METABOLIC PANEL
ALT: 16 U/L (ref 0–53)
AST: 17 U/L (ref 0–37)
Albumin: 4.7 g/dL (ref 3.5–5.2)
Alkaline Phosphatase: 99 U/L (ref 39–117)
BILIRUBIN TOTAL: 0.5 mg/dL (ref 0.2–1.2)
BUN: 11 mg/dL (ref 6–23)
CO2: 30 meq/L (ref 19–32)
CREATININE: 0.97 mg/dL (ref 0.40–1.50)
Calcium: 10.1 mg/dL (ref 8.4–10.5)
Chloride: 101 mEq/L (ref 96–112)
GFR: 83.08 mL/min (ref >60.00)
Glucose, Bld: 127 mg/dL — ABNORMAL HIGH (ref 70–99)
Potassium: 4.4 mEq/L (ref 3.5–5.1)
Sodium: 137 mEq/L (ref 135–145)
Total Protein: 7.5 g/dL (ref 6.0–8.3)

## 2017-12-20 LAB — LIPID PANEL
CHOL/HDL RATIO: 5
CHOLESTEROL: 180 mg/dL (ref 0–200)
HDL: 34.2 mg/dL — ABNORMAL LOW (ref >39.00)
NonHDL: 145.75
Triglycerides: 220 mg/dL — ABNORMAL HIGH (ref 0.0–149.0)
VLDL: 44 mg/dL — AB (ref 0.0–40.0)

## 2017-12-20 LAB — HEMOGLOBIN A1C: Hgb A1c MFr Bld: 6.9 % — ABNORMAL HIGH (ref 4.6–6.5)

## 2017-12-20 LAB — PSA: PSA: 0.63 ng/mL (ref 0.10–4.00)

## 2017-12-20 LAB — LDL CHOLESTEROL, DIRECT: Direct LDL: 113 mg/dL

## 2017-12-20 LAB — TSH: TSH: 1.46 u[IU]/mL (ref 0.35–4.50)

## 2017-12-20 NOTE — Progress Notes (Signed)
Subjective:    Patient ID: Andrew Wiggins, male    DOB: 10-10-1955, 62 y.o.   MRN: [id number]  HPI 62 y/o WM here for a follow up visit... 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 ~  SEE PREV EPIC NOTES FOR OLDER DATA >>     CXR 10/13 showed normal heart size, pacer on left, clear lungs, NAD.Andrew KitchenMarland Wiggins  EKG 10/13 showed NSR, rate85, LBBB, NAD...  LABS 10/13:  FLP- not at goals on diet alone;  Chems- wnl x BS=104;  CBC- wnl;  PSA= 0.54  Carotid Dopplers were ordered but never done...  Arterial Dopplers of LEs 11/13 were WNL- normal waveforms, no blockages...   ~  October 24, 2013:  42mo ROV & Andrew Wiggins is c/o some night sweats and "hot flashes" which he thinks is "the change"; still smoking 1/2 ppd but denies cardioresp symptoms; we discussed trial of "black cohosh" & noted he may need endocrine eval if routine studies are neg & symptom persists... We reviewed the following medical problems during today's office visit >>     COPD> on Mucinex OTC but he won't stay on inhalers, etc; still smoking 1/2ppd but "trying to quit"; he denies recent URI or resp exac; he declines smoking cessation help...    AbnCXR> w/ calcif granuloma right base; f/u CXR 6/15 w/ pacer on left, normal heart size, clear lungs, NAD...    Cig Smoker> still smoking ~1/2ppd, offered smoking cessation help/ counseling/ but he's not interested; I begged him to quit (again)!    HBP> on Lisin20; not on approp salt restriction (reminded); BP= 138/78, & he denies CP/ palpit/ SOB/ edema/ etc...    Cardiac> AVB, Pacer> followed by DrTaylor; EKG 10/13 shows NSR, rate85, LBBB...    Carotid Art dis> on ASA81; last CDoppler 2009 w/ severe plaque on right but no signif ICAstenoses=> needs f/u but he cancelled Dopplers in 2010 &  2013...    Hyperlipid> on diet alone;  FLP 6/15 showed TChol 173, TG 173, HDL 29, LDL 109... rec better low fat diet & incr exercise!    GERD> on Prilosec20 OTC as needed...    Headache> he uses Goodies as needed... We reviewed prob list, meds, xrays and labs> see below for updates >> he notes that OTC B12 tabs are giving him some energy...   CXR 6/15 showed normal heart size, clear lungs, NAD, pacer ok...  LABS 6/15:  FLP- ok x TG/LDL not at goals;  Chems- ok x BS=133;  CBC- wnl;  TSH=0.67;  PSA=0.55;  Sed=9...   ~  December 04, 2014:  56mo ROV & [name] returns for yearly ROV, states breathing is OK, not on meds, still smoking ~1/2ppd; he denies resp exac over the past yr- notes AM cough, min sput, no discoloration or hemoptysis; same SOB/DOE w/ exertion eg-walking but he notes legs bother him more than breathing... His CC is left shoulder pain, denies trauma, OTC analgesics help & we have rec trial Tramadol & refer to Ortho for XRays and further eval... We reviewed the following medical problems during today's office visit >>     COPD> on Mucinex OTC prn but he won't stay on inhalers, etc; still smoking 1/2ppd; he denies recent URI or resp exac; he declines smoking cessation help.Andrew KitchenMarland Wiggins  AbnCXR> w/ calcif granuloma right base; f/u CXR 7/16 w/ pacer on left, normal heart size, clear lungs, NAD...    Cig Smoker> still smoking ~1/2ppd, offered smoking cessation help/ counseling/ but he's not interested; I begged him to quit (again)!    HBP> on Lisin20; not on approp salt restriction (reminded); BP= 146/80, & he denies CP/ palpit/ SOB/ edema/ etc...    Cardiac> AVB, Pacer> followed by DrTaylor but last seen ?2011; EKG 10/13 shows NSR, rate85, LBBB; we will arrange for Cards f/u appt..    Carotid Art dis> on ASA81; last CDoppler 2009 w/ severe plaque on right but no signif ICAstenoses=> needs f/u but he cancelled Dopplers in 2010 & 2013...    Hyperlipid> on diet alone;  FLP 6/15 showed TChol 173, TG 173, HDL  29, LDL 109... rec better low fat diet & incr exercise!    GERD> on Prilosec20 OTC as needed...    Headache> he uses Goodies as needed... EXAM showed Afeb, VSS, O2sat=96% on RA;  Heent/neck- 1+right CBruit;  Chest- decr BS at bases, otherw clear;  Heart- RR, gr1/6 SEM w/o r/g;  Abd- sogt, neg;  Ext- neg w/o c/c/e... We reviewed prob list, meds, xrays and labs> see below for updates >>   CXR showed COPD changes, pacer ok, NAD...   Spirometry 12/04/14 was attempted (see graph) but predicted values not recorded on the test...  IMP/PLAN>>  Franciso has refused to quit smoking and won't take inhalers; he has cancelled several attempts at f/u CDopplers; he has not followed up w/ Cards in 5 yrs or had pacer checked; we will arrange for f/u appt w/ Cards => DrTaylor 12/10/14 w/ Carotids;  We will arrange for Ortho referral for left shoulder;  ROV w/ me in 28mo sooner if needed prn...  ADDENDUM>>  CDoppler 12/10/14 showed mixed plaque bilat, incr in right carotid stenosis, same on left;  REC- contin ASA81 daily, QUIT ALL SMOKING, low chol/ low fat diet... Repeat CDoppler 538yr...  ~  June 07, 2015:  28mo ROV & Andrew LawrenceJonah is still smoking ~1/2ppd; c/o sore throat, bronchitic cough, small amt whitish sput w/o blood; he notes ?low grade fever, no c/s, and SOB/DOE w/o change (pt still not exercising);  He is supposed to be on [id number] but it is too expensive & he hasn't refilled this med (reminded that 1/2ppd costs in $2-3 per day, ~$75/mo & he could use this for his co-pay)... We will change Rx to Advair250Bid & he is asked to get a copy of his Insurance co prescription drug formulary... We reviewed the above problems list... EXAM showed Afeb, VSS- BP=150/80 recheck, O2sat=98% on RA;  Heent/neck- 1+right CBruit;  Chest- decr BS at bases, otherw clear;  Heart- RR, gr1/6 SEM w/o r/g;  Abd- sogt, neg;  Ext- neg w/o c/c/e... IMP/PLAN>>  Andrew Wiggins needs to quit all smoking before it is too late but he is clearly NOT motivated  to do so & declines smoking cessation help; we will Rx w/ ZPak for prn use & switch to.[id number]; OK 2016 Flu vaccine today...   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 We reviewed the following medical problems during today's office visit >>     COPD> on Mucinex OTC prn but he won't stay on inhalers ?due to cost; still smoking but <1/2ppd now; he denies recent URI or resp exac; he declines smoking cessation help...    AbnCXR> w/ calcif granuloma right base; f/u CXR 7/17 w/ pacer on left, normal heart size, clear lungs, NAD...    Cig Smoker> still smoking <1/2ppd now, offered smoking cessation help/ counseling/ but he's not interested; I begged him to quit (again)!    HBP> on Lisin20 (med refilled); not on approp salt restriction (reminded); BP= 118/74, & he denies CP/ palpit/ SOB/ edema/ etc...    Cardiac> AVB, Pacer> followed by DrTaylor but last seen 8/16 (chart reviewed by me)- Hx CHB, PPM insertion, HBP; device functioning normally, no change in meds...    Carotid Art dis> on ASA81; last CDoppler 2009 w/ severe plaque on right but no signif ICAstenoses=> needs f/u but he cancelled Dopplers in 2010 & 2013...    Hyperlipid> on diet alone;  FLP 6/15 showed TChol 173, TG 173, HDL 29, LDL 109... rec better low fat diet & incr exercise! He is once again NOT fasting for labs today...    NEW= DM> blood sugar today = 199 (prev 104-133); we reviewed need for low carb diet & incr exercise & no smoking=> rec to start METFORMIN-ER 500mg  Qam...    GERD> on Prilosec20 OTC as needed...    Headache> he uses  Goodies as needed... EXAM showed Afeb, VSS, O2sat=98% on RA;  Heent/neck- 1+right CBruit;  Chest- decr BS at bases, otherw clear;  Heart- RR, gr1/6 SEM w/o r/g;  Abd- sogt, neg;  Ext- neg w/o c/c/e; Neuro intact w/o focal abn x R-CBruit...  CXR 12/06/15 (independently reviewed by me in the PACS system) showed norm heart size, hyperinflated lungs c/w COPD but clear 7 NAD, pacemaker on left...  Spirometry 12/06/15 (independently interpreted by me) FVC=3.27 (71%), FEV1=2.32 (67%), %1sec=71, mid-flows reduced at 54% predicted;  This represents mild airflow obstruction & GOLD Stage2 COPD (can't r/o mild superimposed restriction)...  LABS 12/06/15>  Chems- ok x BS=199;  CBC- wnl;  TSH=1.33;  PSA=0.59... IMP/PLAN>>  Kamarion has some underlying COPD & desperately needs to quit smoking; rec to start [id number] but unable to afford w/o insurance coverage- we discussed the obvious to quit smoking & save the money towards needed meds but he is not impressed; he will look into PartD coverage... we plan ROV in 878mo w/ labs and A1c...  ~  June 07, 2016:  108mo ROV & when Andrew LawrenceJonah was here last we 1) begged him to quit smoking due to his mild COPD & his inability to afford inhalers; and 2) started Metformin500Qam for his DM w/ BS=199 in July2017; he was asked to f/u in 878mo but never made this appt and comes in today for 108mo ROV but he STOPPED the Metformin severasl months ago => he tells me that [facility] w/ diarrhea and LLQ pain, he says a scan showed pancreatitis, they stopped the Metformin, he was NOT admitted, placed on a liq diet & his symptoms resolved=> he did not see a gastroenterologist, did not call our office for follow up, and we never received any records from [facility]... we reviewed the following medical problems during today's office visit >>     COPD>  on Mucinex OTC prn but he won't stay on inhalers ?due to cost; still smoking but ~1/2ppd now; he denies recent URI or resp  exac; he declines smoking cessation help...    AbnCXR> w/ calcif granuloma right base; f/u CXR 7/17 w/ pacer on left, normal heart size, clear lungs, NAD...    Cig Smoker> still smoking ~1/2ppd now, offered smoking cessation help/ counseling/ but he's not interested; I begged him to quit (again)!    HBP> on Lisin20 (med refilled); not on approp salt restriction (reminded); BP= 140/78, & he denies CP/ palpit/ SOB/ edema/ etc...    Cardiac> AVB, Pacer> followed by DrTaylor but last seen 8/16 (chart reviewed by me)- Hx CHB, PPM insertion, HBP; device functioning normally, no change in meds, asked to f/u 44yr...    Carotid Art dis> on ASA81; he cancelled CDopplers in 2010 & 2013; last CDoppler 12/2014 showed mixed plaque bilat, worsening right stenosis at 40-59%, similar left stenosis at 1-39%, & rec to take ASA, quit smoking, contol BP, Chol, BS to help prevent stroke...    Hyperlipid> on diet alone;  FLP 6/15 showed TChol 173, TG 173, HDL 29, LDL 109... rec better low fat diet & incr exercise! Repeat FLP 1/18 shows TChol 209, TG 246, HDL 30, LDL 110=> offered meds he declines wants diet alone    DM> blood sugar 7/17 = 199 (prev 104-133); we reviewed need for low carb diet & incr exercise & no smoking=>started on Metform500Qam, but he stopped 02/2016 w/ [facility] ER visit for diarrhea/ LLQ pain and said to have pancreatitis- he was not adm, we do not have any records, he was not seen by GI, and never called [place] for follow up here; LABS today 1/18 shows BS=279, A1c=10.9 & he is referred to Endocrine-DM for ASAP consultation...     GERD> on Prilosec20 OTC as needed...    Headache> he uses Goodies as needed... EXAM showed Afeb, VSS, O2sat=97% on RA;  Heent/neck- 1+right CBruit;  Chest- decr BS at bases, otherw clear;  Heart- RR, gr1/6 SEM w/o r/g;  Abd- sogt, neg;  Ext- neg w/o c/c/e; Neuro intact w/o focal abn x R-CBruit...  LABS 06/07/16>  FLP- not at goals on diet alone, refuses med rx;  Chems- ok x BS=279,  A1c=10.9 => needs Diabetic specialist (refer to DrEllison);   TSH=1.20;  CBC- wnl... IMP/PLAN>>  Garl is a walking time-bomb medically- smoker w/ COPD & abnCXR, known HBP, heart dis/pacer, HL, DM, and hx poor compliance w/ medical therapy & my recommendations for his care; he can't vs won't quit smoking, needs f/u w/ Cards-DrTaylor, needs statin added to regimen (declines), needs Endocrine-DM consult for on-going management of his DM => we will refer to [facility] ASAP...  ~  December 20, 2016:  6-75mo ROV & at our last visit Orvill's BS=279, A1c=10.9 & he was referred to [facility]/ DM but he never went for the appt- says it was due to a death in the family & never called to reschedule;  He is now c/o neuropathic symptoms in his legs, esp Qhs, & we reviewed the importance of tight DM control, need for DM consult & management, consideration of Gabapentin medication, etc;  Also reminded of the CV risk & need for f/u by CARDS (prev getting phone monitoring by DrTaylor but pt stopped this on his own...  We reviewed the following medical problems during today's office visit>      COPD, GOLD 2> on Mucinex OTC prn but he won't  stay on inhalers ?due to cost; still smoking but ~1/2ppd now; he denies recent URI or resp exac; he declines smoking cessation help, refuses regular inhaler meds...    AbnCXR> w/ calcif granuloma right base; f/u CXR 8/18 w/ pacer on left, normal heart size, clear lungs, NAD...    Cig Smoker> still smoking ~1/2ppd now, offered smoking cessation help/ counseling/ but he's not interested; I begged him to quit (again)!    HBP> on Lisin20 (med refilled); not on approp salt restriction (reminded); BP= 160/80, & he denies CP/ palpit/ SOB/ edema/ etc; reminded to take med daily, elim sodium, monitor BP at home & call for issues...    Cardiac> AVB, Pacer> followed by DrTaylor but last seen 8/16 (chart reviewed by me)- Hx CHB, PPM insertion, HBP; device functioning normally, no change in meds,  asked to f/u 13yr...    Carotid Art dis> on ASA81; he cancelled CDopplers in 2010 & 2013; last CDoppler 12/2014 showed mixed plaque bilat, worsening right stenosis at 40-59%, similar left stenosis at 1-39%, & rec to take ASA, quit smoking, contol BP, Chol, BS to help prevent stroke...    Hyperlipid> on diet alone;  FLP 1/18 showed TChol 209, TG 246, HDL 30, LDL 110... rec better low fat diet & increase exercise, offered meds he declines wants diet alone...    DM> blood sugar 7/17 = 199 (prev 104-133); we reviewed need for low carb diet & incr exercise & no smoking=>started on Metform500Qam, but he stopped 02/2016 w/ [facility] visit for diarrhea/ LLQ pain and said to have pancreatitis- he was not adm, we do not have any records, he was not seen by GI, and never called [place] for follow up here; LABS 1/18 shows BS=279, A1c=10.9 & he is referred to Endocrine-DM but he cancelled & never resched...    GERD> on Prilosec20 OTC as needed...    Headache> he uses Goodies as needed... EXAM showed Afeb, VSS, O2sat=96% on RA;  Heent/neck- 1+right CBruit;  Chest- decr BS at bases, otherw clear;  Heart- RR, gr1/6 SEM w/o r/g;  Abd- soft, neg;  Ext- neg w/o c/c/e; Neuro intact w/o focal abn...  CXR 12/20/16 (independently reviewed by me in the PACS system) showed norm heart size & vascularity, pacer unchanged, lungs clear w/ sl hyperinflation- NAD...  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   ~  July 04, 2017:  72mo ROV & Avrohom reports doing well- no new complaints or concerns;  He saw DrKumar for DM & was placed on Actos but he only took for 92mo & never refilled, didn't f/u, etc;  He remains on Glimep4mg /d & still needs commitment to low carb diet, exercise, etc;  He is  still smoking ~1/2ppd and can't vs won't quit;  States cardiac stable, breathing OK, & we discussed f/u LABS today=> BS=183, A1c=7.5 We reviewed the following interval medical visits since he was last here>       He continues to have regularly sched remote pacer checks by Cards- DrTaylor...     He saw CARDS- DrTaylor on 01/25/17>  HBP, CHB- s/p pacer, still smoking, denies CP/ SOB/ edema; on ASA & Lisin20- doing satis x "white coat" HBP & asked to continue BP chacks at home to be sure he is well controlled...     He saw ENDOCRINE- DrKumar 02/07/17>  DM2 & mixed hyperlipidemia, early PN symptoms; intol Metform due to diarrhea, not following diet, not checking sugars, not exercising; on Actos15 + Glimep4 but hasn't followed up w/ Endocrine since then...  We reviewed the following medical problems during today's office visit>      COPD, GOLD 2> on Mucinex OTC prn but he won't stay on inhalers ?due to cost; still smoking but ~1/2ppd now; he denies recent URI or resp exac; he declines smoking cessation help, refuses regular inhaler meds...    AbnCXR> w/ calcif granuloma right base; f/u CXR 8/18 w/ pacer on left, normal heart size, clear lungs, NAD...    Cig Smoker> still smoking ~1/2ppd now, offered smoking cessation help/ counseling/ but he's not interested; I begged him to quit (again)!    HBP> on Lisin20 (med refilled); not on approp salt restriction (reminded); BP= 146/82, & he denies CP/ palpit/ SOB/ edema/ etc; reminded to take med  daily, elim sodium, monitor BP at home & call for issues...    Cardiac> AVB, Pacer> followed by DrTaylor & last seen 9/18 (chart reviewed by me)- Hx CHB, PPM insertion, HBP; device functioning normally, no change in meds, asked to f/u 67yr...    Carotid Art dis> on ASA81; he cancelled CDopplers in 2010 & 2013; last CDoppler 12/2014 showed mixed plaque bilat, worsening right stenosis at 40-59%, similar left stenosis at 1-39%, & rec to take ASA, quit smoking, contol BP, Chol, BS to help prevent stroke...    Hyperlipid> on diet alone;  FLP 1/18 showed TChol 209, TG 246, HDL 30, LDL 110... rec better low fat diet & increase exercise, offered meds he declines wants diet alone...    DM> blood sugar 7/17 = 199 (prev 104-133); we reviewed need for low carb diet & incr exercise & no smoking=>started on Metform500Qam, but he stopped 02/2016 w/ [facility] ER visit for diarrhea/ LLQ pain and said to have pancreatitis- he was not adm, we do not have any records, he was not seen by GI, and never called [place] for follow up here; LABS 1/18 shows BS=279, A1c=10.9 & he is referred to Endocrine-DM but he cancelled & never resched; he finally saw DrKumar 02/2017 w/ Actos15 added to the [facility] but he never followed up & stopped the Actos on his own; Labs 07/04/17 on diet + Glim4 showed BS=183, A1c=7.5 & we discussed trial MetformER-500mg /d    GERD> on Prilosec20 OTC as needed...    Headache> he uses Goodies as needed... EXAM showed Afeb, VSS, O2sat=98% on RA;  Heent/neck- 1+right CBruit;  Chest- decr BS at bases, otherw clear;  Heart- RR, gr1/6 SEM w/o r/g;  Abd- soft, neg;  Ext- neg w/o c/c/e; Neuro intact w/o focal abn...  LABS 07/04/17>  BMet- BS=183, K-4.3, Cr=0.84;  A1c=7.5.Andrew KitchenMarland Wiggins IMP/PLAN>>  He refused to stay on the Actos (?why- generic is only $9 on GoodRx) & we  discussed options- continue Glimep4 & agrees to trial METFORM-ER500 one daily and he is instructed to call me w/ any problems to pursue alternatives if necessary...   ~   December 20, 2017:  4mo ROV & Jovanni reports stable, feeling well overall, still smoking 1/2ppd, taking his Metform-ER500 & Glimep4 daily;  Today is his 62rd birthday!!!    We discussed my up-coming retirement & he has agreed to continue his medical journey w/ DrPerry at [facility] in [place] closer to home...          PROBLEM LIST:    CHRONIC OBSTRUCTIVE PULMONARY DISEASE (ICD-496) - hx recurrent bronchitic episodes in the past... smokes 1/2 - 1ppd x yrs... he notes mild cough, occas sputum, denies SOB... He will not stay on inhaled Rx... ~  PFT's 11/08 showed FVC= 3.85 (79%), FEV1= 2.19 (55%), %1sec= 57, mid-flows= 32% predicted:  all c/w mod airflow obstruction...   Hx of ABNORMAL CHEST XRAY (ICD-793.1) - prev film w/ sm calcif granuloma right base, otherw neg...  ~  CXR 1/10 showed pacer placed, clear lungs, NAD.Andrew Wiggins. ~  CXR 12/10 showed Pacer on left, clear lungs & mild hyperinflation, NAD.Andrew Wiggins. ~  CXR 10/13 showed normal heart size, pacer on left, clear lungs, NAD.Andrew Wiggins. ~  CXR 6/15 showed normal heart size, clear lungs, NAD, pacer ok... ~  CXR 7/16 showed COPD changes, pacer ok, NAD...  CIGARETTE SMOKER (ICD-305.1) - 30+ yrs of >1ppd smoking... Now down to 1/2 ppd... we have discussed smoking cessation on numerous occas & reviewed options again today... He continues to decline smoking cessation help...  HYPERTENSION (ICD-401.9) - hx HBP on LISINOPRIL 20mg /d...  ~  2DEcho 11/09 showed sl incr LVwall thickness, norm LVF w/ EF= 55-60%, no regional wall motion abn, mild DD, mild MR... ~  3/12:  BP= 140/80 & denies fatigue, visual changes, CP, palipit, syncope, dyspnea, edema, etc... Changed to Lisinopril20. ~  10/13:  BP= 164/88 but he ran out of Lisin20 3d ago; he denies CP, palpit, dizzy, SOB, edema... ~  4/14:  on Lisin20; not on approp salt restriction; BP= 130/90, & he denies CP/ palpit/ SOB/ edema/ etc. ~  6/15:  on Lisin20; not on approp salt restriction (reminded); BP= 138/78, & he  denies CP/ palpit/ SOB/ edema/ etc... ~  7/16: on Lisin20; BP=146/80 & he remains asymptomatic, reminded to take med every day & elim salt...  CHEST WALL PAIN, HX OF (ICD-V15.89) ~  NuclearStressTest 12/09 = atypic Cp, LBBB, no perfusion defects, EF= 50% w/ abn septal motion.  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  ~  EKG 10/13 showed NSR, rate85, LBBB, NAD.  CAROTID ARTERY DISEASE >> on ASA 81mg /d... exam 11/09 shows gr 1-2 sys murmur LSB, but also has prominent bruit to base of right side of his neck w/ right > left Carotid Bruit... ~  CDopplers 11/09 showed severe plaque right ICA, & min plaque on left... no signif ICA stenoses per report. ~  f/u CDopplers 12/10 were ordered but pt never had these done... ~  f/u CDopplers 11/13 ==> pending (pt cancelled) ~  Pt has declined f/u CDopplers... ~  7/16: we arranged to f/u CDopplers and ROV w/ DrTaylor 12/10/14...  R/O PERIPHERAL ARTERIAL DISEASE >> presented 10/13 w/ leg pain on ambulation... ~  11/13:  ArtDopplers & ABIs ==> normal  HYPERLIPIDEMIA >>  ~  FLP 10/13 on diet alone showed TChol 194, TG 272, HDL 30, LDL  119... We reviewed low fat diet needed...  ~  FLP 6/15 on diet alone showed TChol 173, TG 173, HDL 29, LDL 109... Needs better low fat diet & incr exercise...  DIABETES MELLITUS >>  ~  Labs 11/2015 showed BS199 (prev 104-133) and he was started on Metformin500Qam; he did not return as requested to f/u efficacy... ~  He tells me he presented to [facility] ER [id number] w/ diarrhea & LLQ pain; told he had pancreatitis & says he was told to stop the Metformin; he was not admitted- did not consult w/ Endocrine/ GI/ etc- and we were never sent notification or records, and pt did not call our office for f/u visit! ~  Pt returned 06/07/16 for ROV on diet alone & Labs showed BS=279, A1c=10.9, and he is being referred to Endocrine-DM ASAP for consultation and DM management...    GERD (ICD-530.81) - prev mild GERD symptoms that improved after cholecystectomy on 2000... uses OTC PPI as needed... ~  he is due for routine screening colonoscopy but he refuses to schedule this important screening procedure...  HEADACHE (ICD-784.0) - long hx of intermittent HA's and had neuro eval by DrStiefel in the 80's thought to be mixed muscle contraction & migraines... ~  He uses Goodie's powders as needed...  CONTACT DERMATITIS DUE TO POISON IVY (ICD-692.6)  Heath Maintenance:  OK Flu shot & Pneumovax 12/10...   Past Medical History:  Diagnosis Date  . Abnormal chest x-ray   . AV block    history of  . Cardiac pacemaker in situ   . Chest wall pain   . COPD (chronic obstructive pulmonary disease) (HCC)   . Dermatitis, contact    due to poison ivy  . GERD (gastroesophageal reflux disease)   . Headache(784.0)   . Hypertension   . Peripheral vascular disease (HCC)   . Tobacco use disorder     Past Surgical History:  Procedure Laterality Date  . laproscopic cholecystectomy  2000   by Dr. Odie Sera  . PACEMAKER INSERTION  03/2008   by Dr. Ladona Ridgel    Outpatient Encounter Medications as of 12/20/2017  Medication Sig  . aspirin 81 MG tablet Take 81 mg by mouth daily.    . Aspirin-Acetaminophen-Caffeine (GOODY HEADACHE PO) Take 1 packet by mouth daily as needed for headaches  . glimepiride (AMARYL) 4 MG tablet Take 1 tablet (4 mg total) by mouth daily with breakfast.  . lisinopril (PRINIVIL,ZESTRIL) 20 MG tablet TAKE 1 TABLET BY MOUTH ONCE DAILY  . metFORMIN (GLUCOPHAGE-XR) 500 MG 24 hr tablet Take 1 tablet (500 mg total) by mouth daily with breakfast.  . [DISCONTINUED] HYDROcodone-homatropine (HYCODAN) 5-1.5 MG/5ML syrup Take 5 mLs by mouth every 6 (six) hours as needed for cough.   No facility-administered encounter medications on file as of 12/20/2017.     No Known Allergies   Immunization History  Administered Date(s) Administered  . Influenza Whole 04/15/2009   . Influenza,inj,Quad PF,6+ Mos 06/07/2015, 06/07/2016  . Pneumococcal Polysaccharide-23 04/15/2009    Current Medications, Allergies, Past Medical History, Past Surgical History, Family History, and Social History were reviewed in [employer] record.   Review of Systems    Constitutional:  Denies F/C/S, anorexia, unexpected weight change. He is c/o night sweats. HEENT:  No HA, visual changes, earache, nasal symptoms, sore throat, hoarseness. Resp:  min cough & sputum; no hemoptysis; no SOB, tightness, wheezing. Cardio:  No CP, palpit, DOE, orthopnea, edema. GI:  Denies N/V/D/C or blood in stool; no  reflux, abd pain, distention, or gas. GU:  No dysuria, freq, urgency, hematuria, or flank pain. MS:  Denies joint pain, swelling, tenderness, or decr ROM; no neck pain, back pain, etc. Neuro:  No tremors, seizures, dizziness, syncope, weakness, numbness, gait abn. Skin:  No suspicious lesions or skin rash. Heme:  No adenopathy, bruising, bleeding. Psyche: Denies confusion, sleep disturbance, hallucinations, anxiety, depression.    Objective:   Physical Exam   WD, WN, 62 y/o WM in NAD... Vital Signs:  Reviewed... BP recheck 142/ 80... General:  Alert & oriented; pleasant & cooperative... HEENT:  [place]/AT, EOM-wnl, PERRLA, Fundi-benign, EACs-clear, TMs-wnl, NOSE-clear, THROAT-clear & wnl.  Neck:  Supple w/ full ROM; no JVD; normal carotid impulses & faint right neck bruit; no thyromegaly or nodules palpated; no lymphadenopathy.  Chest:  Clear to P & A; without wheezes/ rales/ or rhonchi heard... Heart:  Regular Rhythm; pacer palpated in left upper chest wall, norm S1 & S2 without murmurs/ rubs/ or gallops detected... Abdomen:  Soft & nontender; normal bowel sounds; no organomegaly or masses palpated... Ext:  Normal ROM; without deformities or arthritic changes; no varicose veins, venous insuffic, or edema... Derm:  No lesions noted; no rash etc... Scar on bridge of  nose from prev skin ca... Lymph:  No cervical, supraclavicular, axillary, or inguinal adenopathy palpated...    Assessment & Plan:    06/07/16>   Danh is a walking time-bomb medically- smoker w/ COPD & abnCXR, known HBP, heart dis/pacer, HL, DM, and hx poor compliance w/ medical therapy & my recommendations for his care; he can't vs won't quit smoking, needs f/u w/ Cards-DrTaylor, needs statin added to regimen (declines), needs Endocrine-DM consult for on-going management of his DM => we will refer to [facility] ASAP. 12/20/16>   nah had some significant stress & family issues over the last 69mo forcing him to cancel the planned Endocrine/DM consult and he never let us know about these problems;  In the interim he has developed some DM neuropathy symptoms in his LEs (mild & he doesn't want Rx yet), he has continued smoking at least 6cig/d, and NOT really dieting (asked to follow a low carb, no sweets, low sodium diet);  He is intol to Metformin & not currently taking any DM meds;  Follow up labs show FBS=232 & A1c=9.3, and his CXR is clear;  We discussed starting GLIMEPIRIDE 4mg  Qam in the interim & getting in to see Endocrine/DM ASAP-- he also needs a f/u w/ DrTaylor for CARDS/EP (last seen 12/10/14 and last transmission was 09/27/15). 07/04/17>   He refused to stay on the Actos (?why- generic is only $9 on GoodRx) & we discussed options- continue glimep4 & agrees to trial [id number]-ER500 one daily and he is instructed to call me w/ any problems to pursue alternatives if necessary 12/20/17>      Pulm> COPD, AbnCXR, CigSmoker> CXRs w/o acute changes; must quit all smoking but he is not inclined toward counseling etc...  HBP>  BP is controlled on Lisin20- reminded to take it every day!  Cardiac> LBBB, Pacer>  Followed by DrTaylor & due for f/u visit & pacer check...  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  HYPERLIPID>   FLP not controlled on diet alone, esp w/ mult coronary risk factors; I have advised STATIN therapy but he declines, wants diet alone...  NEW => DM w/ FBS 11/2015 = 199 & we reviewed diet, exercise, start  METFORMIN-ER 500mg  Qam=> this was stopped 02/2016 by [facility] & pt never followed up w/ me... ~  Pt returned 06/07/16 for ROV on diet alone & Labs showed BS=279, A1c=10.9, and he is being referred to Endocrine-DM ASAP for consultation and DM management...   PAD>  Presented 10/13 w/ LE pain on ambulation; Art dopplers ==> wnl...  GERD> min symptoms and uses OTC Prilosec as needed...  Need for baseline Colonoscopy>  He hasn't yet done his baseline colonoscopy & reminded again...  Headaches>  Uses Goodies as needed...   Patient's Medications  New Prescriptions   No medications on file  Previous Medications   ASPIRIN 81 MG TABLET    Take 81 mg by mouth daily.     ASPIRIN-ACETAMINOPHEN-CAFFEINE (GOODY HEADACHE PO)    Take 1 packet by mouth daily as needed for headaches   GLIMEPIRIDE (AMARYL) 4 MG TABLET    Take 1 tablet (4 mg total) by mouth daily with breakfast.   LISINOPRIL (PRINIVIL,ZESTRIL) 20 MG TABLET    TAKE 1 TABLET BY MOUTH ONCE DAILY   METFORMIN (GLUCOPHAGE-XR) 500 MG 24 HR TABLET    Take 1 tablet (500 mg total) by mouth daily with breakfast.  Modified Medications   No medications on file  Discontinued Medications   HYDROCODONE-HOMATROPINE (HYCODAN) 5-1.5 MG/5ML SYRUP    Take 5 mLs by mouth every 6 (six) hours as needed for cough.

## 2017-12-20 NOTE — Patient Instructions (Signed)
Today we updated your med list in our EPIC system...    Continue your current medications the same...  We reviewed your diabetic MEDS and DIET...  Today we checked your follow up FASTING blood work...    We will contact you w/ the results when available...   Call for any questions or if we can be of service in any way...  Osmond, it has been my honor to have been your doctor over these many years... Continue to work on smoking cessation!!!  Stay as active as possible & stay away from the sweets and carbs... My best wishes to you for the future.[name]..[name]

## 2018-02-11 ENCOUNTER — Telehealth: Payer: Self-pay

## 2018-02-11 ENCOUNTER — Encounter: Payer: PPO | Admitting: *Deleted

## 2018-02-11 NOTE — Telephone Encounter (Signed)
Attempted to confirm remote transmission with pt. No answer and was unable to leave a message.   

## 2018-02-15 ENCOUNTER — Telehealth: Payer: Self-pay | Admitting: Pulmonary Disease

## 2018-02-15 MED ORDER — METFORMIN HCL ER 500 MG PO TB24: 500.0000 mg | ORAL_TABLET | Freq: Every day | ORAL | 3 refills | Status: DC

## 2018-02-15 MED ORDER — GLIMEPIRIDE 4 MG PO TABS: 4.0000 mg | ORAL_TABLET | Freq: Every day | ORAL | 3 refills | Status: DC

## 2018-02-15 MED ORDER — LISINOPRIL 20 MG PO TABS: 20.0000 mg | ORAL_TABLET | Freq: Every day | ORAL | 3 refills | Status: DC

## 2018-02-15 NOTE — Telephone Encounter (Signed)
Called and spoke with Patient.  Patient requesting refills on Lisinipril, Metformin, and Amaryl. Refills sent to preferred pharmacy, [facility] in [place].  Nothing further at this time.

## 2018-02-20 ENCOUNTER — Encounter: Payer: Self-pay | Admitting: Cardiology

## 2019-02-18 DIAGNOSIS — Z1159 Encounter for screening for other viral diseases: Secondary | ICD-10-CM | POA: Diagnosis not present

## 2019-02-18 DIAGNOSIS — F17218 Nicotine dependence, cigarettes, with other nicotine-induced disorders: Secondary | ICD-10-CM | POA: Diagnosis not present

## 2019-02-18 DIAGNOSIS — Z6822 Body mass index (BMI) 22.0-22.9, adult: Secondary | ICD-10-CM | POA: Diagnosis not present

## 2019-02-18 DIAGNOSIS — E1169 Type 2 diabetes mellitus with other specified complication: Secondary | ICD-10-CM | POA: Diagnosis not present

## 2019-02-18 DIAGNOSIS — E782 Mixed hyperlipidemia: Secondary | ICD-10-CM | POA: Diagnosis not present

## 2019-02-18 DIAGNOSIS — I10 Essential (primary) hypertension: Secondary | ICD-10-CM | POA: Diagnosis not present

## 2019-02-18 DIAGNOSIS — J440 Chronic obstructive pulmonary disease with acute lower respiratory infection: Secondary | ICD-10-CM | POA: Diagnosis not present

## 2019-02-26 DIAGNOSIS — E875 Hyperkalemia: Secondary | ICD-10-CM | POA: Diagnosis not present

## 2019-04-25 IMAGING — DX DG CHEST 2V
2 series · 2 of 2 positions shown · non-contrast
Comparison: December 06, 2015

CLINICAL DATA: Hypertension.  History of cardiac arrhythmia

EXAM:
CHEST  2 VIEW

[chest pa]
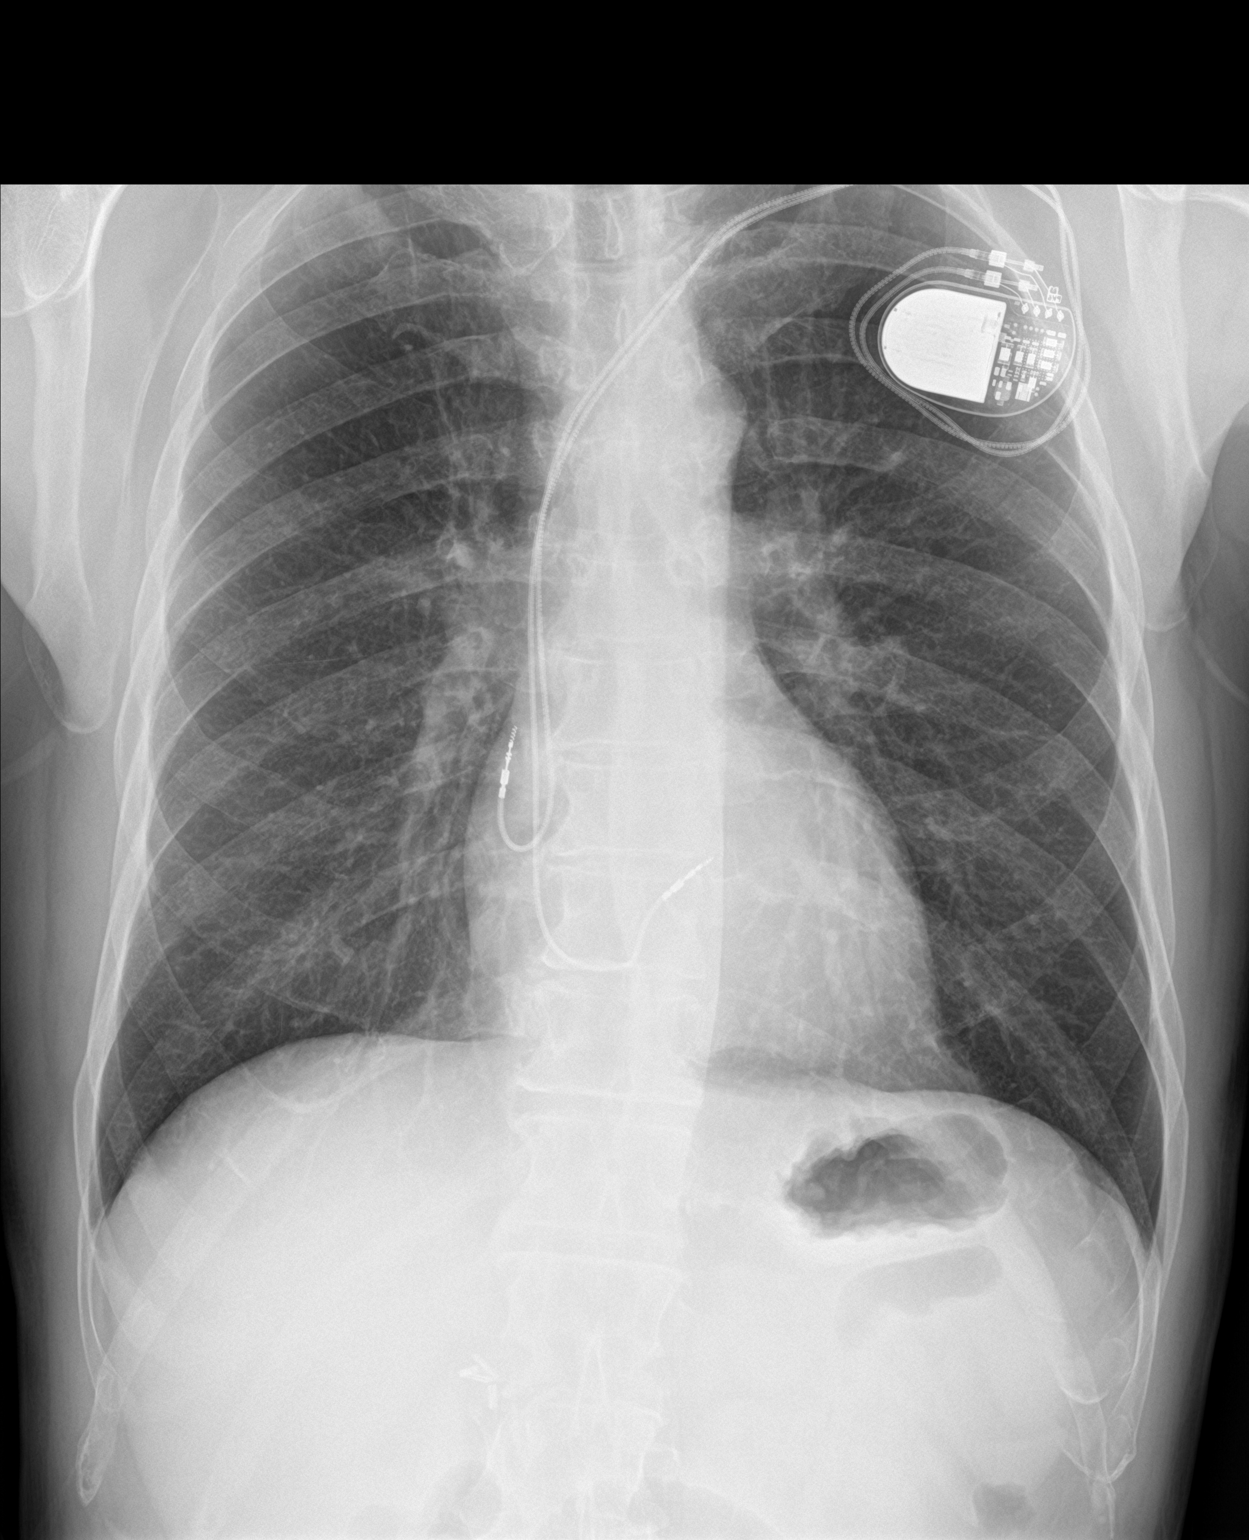

[chest lat]
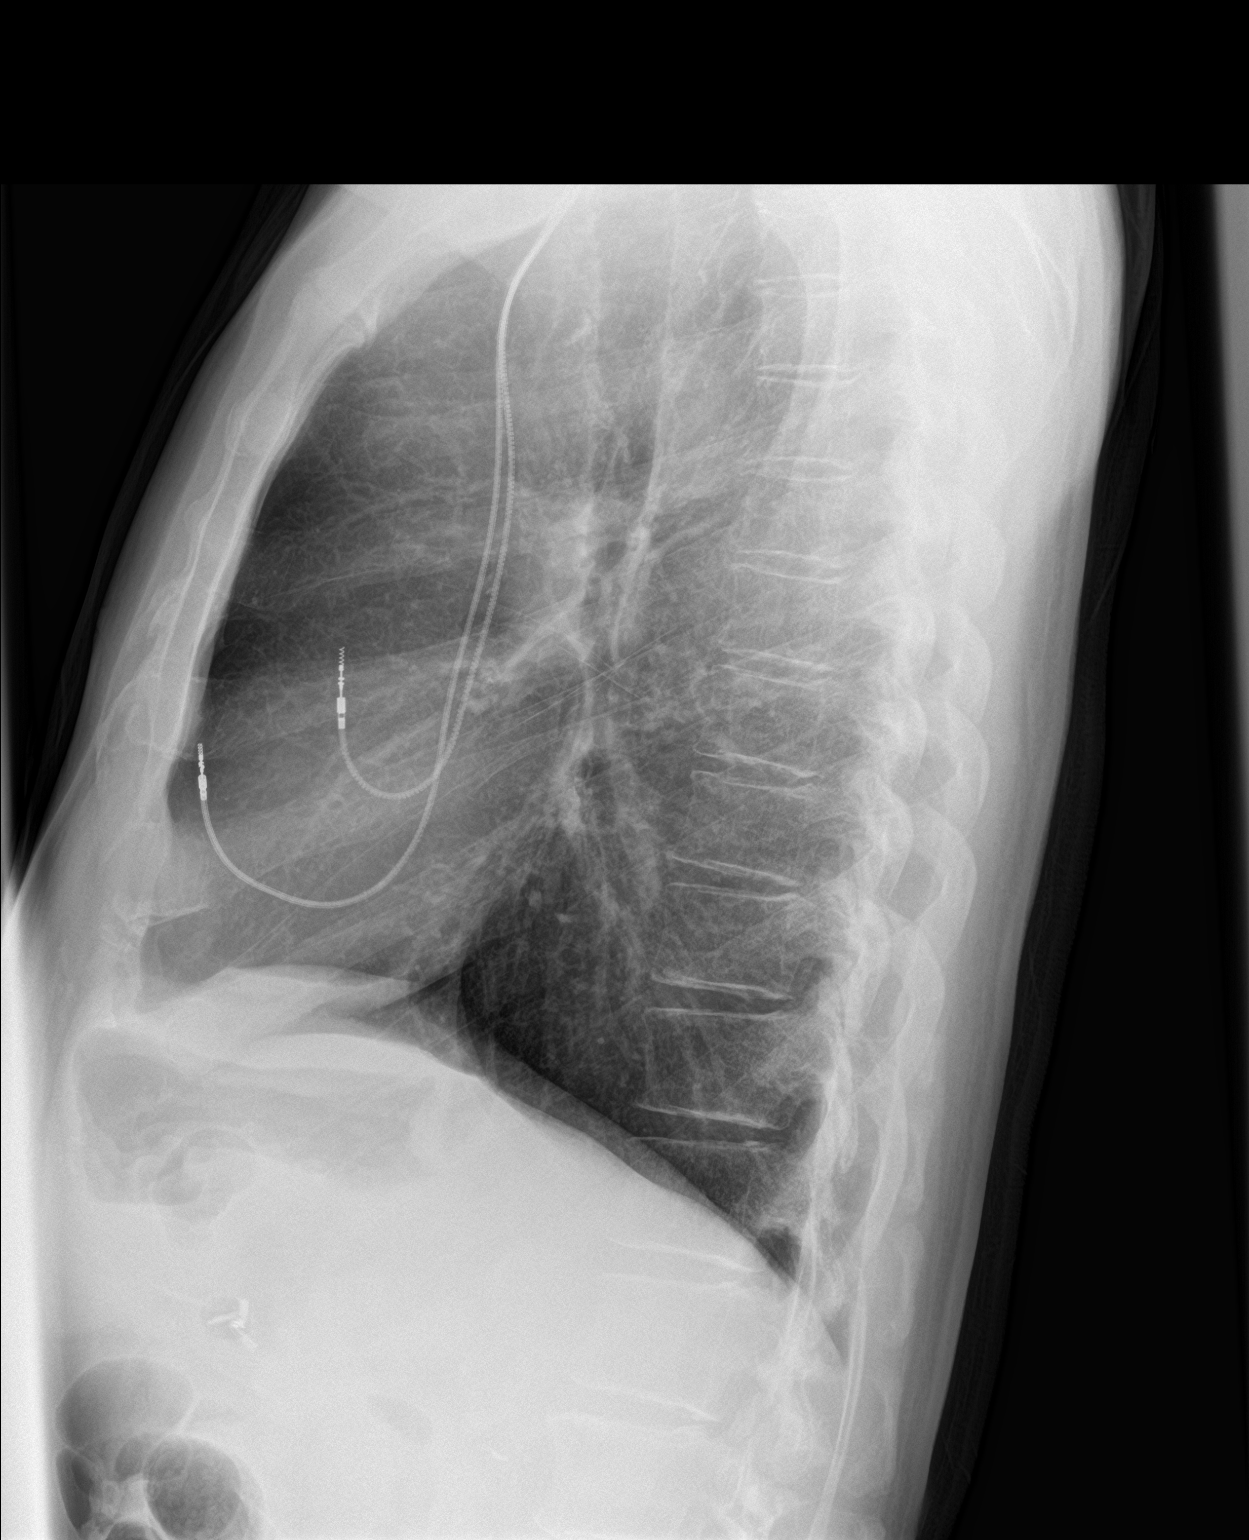

[2 of 2 positions shown; findings below may reference images not displayed]

FINDINGS: Pacemaker leads are unchanged. Heart size and pulmonary vascularity
are normal. No edema or consolidation. No adenopathy. No bone
lesions. No pneumothorax.
IMPRESSION: No edema or consolidation.  Stable cardiac silhouette.

## 2019-06-19 DIAGNOSIS — E782 Mixed hyperlipidemia: Secondary | ICD-10-CM | POA: Insufficient documentation

## 2019-06-19 HISTORY — DX: Mixed hyperlipidemia: E78.2

## 2019-06-20 ENCOUNTER — Ambulatory Visit (INDEPENDENT_AMBULATORY_CARE_PROVIDER_SITE_OTHER): Payer: PPO | Admitting: Legal Medicine

## 2019-06-20 ENCOUNTER — Other Ambulatory Visit: Payer: Self-pay

## 2019-06-20 ENCOUNTER — Encounter: Payer: Self-pay | Admitting: Legal Medicine

## 2019-06-20 VITALS — BP 136/64 | HR 88 | Temp 96.7°F | Resp 17 | Ht 70.0 in | Wt 154.0 lb

## 2019-06-20 DIAGNOSIS — E1140 Type 2 diabetes mellitus with diabetic neuropathy, unspecified: Secondary | ICD-10-CM | POA: Diagnosis not present

## 2019-06-20 DIAGNOSIS — I10 Essential (primary) hypertension: Secondary | ICD-10-CM | POA: Diagnosis not present

## 2019-06-20 DIAGNOSIS — E782 Mixed hyperlipidemia: Secondary | ICD-10-CM | POA: Diagnosis not present

## 2019-06-20 DIAGNOSIS — E1169 Type 2 diabetes mellitus with other specified complication: Secondary | ICD-10-CM

## 2019-06-20 MED ORDER — LISINOPRIL 20 MG PO TABS: 20.0000 mg | ORAL_TABLET | Freq: Every day | ORAL | 2 refills | Status: DC

## 2019-06-20 NOTE — Assessment & Plan Note (Addendum)
AN INDIVIDUAL CARE PLAN was established and reinforced today.  The patient's status was assessed using clinical findings on exam, lab and other diagnostic tests. The patient's disease status was assessed based on evidence-based guidelines and found to be GOOD controlled. MEDICATIONS were REVIEWED. SELF MANAGEMENT GOALS have been discussed and patient's success at attaining the goal of low cholesterol was assessed. RECOMMENDATION given include regular exercise 3 days a week and low cholesterol/low fat diet. CLINICAL SUMMARY including written plan to identify barriers unique to the patient due to social or economic  reasons was discussed.

## 2019-06-20 NOTE — Assessment & Plan Note (Signed)
An individual care plan was established and reinforced today.  The patient's status was assessed using clinical findings on exam, labs and diagnostic testing. Patient success at meeting goals based on disease specific evidence-based guidelines and found to be GOOD controlled. Medications were assessed and patient's understanding of the medical issues , including barriers were assessed. Recommend adherence to a diabetic diet, a graduated exercise program, HgbA1c level is checked quarterly, and urine microalbumin performed yearly .  Annual mono-filament sensation testing performed. Lower blood pressure and control hyperlipidemia is important. Get annual eye exams and annual flu shots and smoking cessation discussed.  Self management goals were discussed.

## 2019-06-20 NOTE — Progress Notes (Signed)
Established Patient Office Visit  Subjective:  Patient ID: Andrew Wiggins, male    DOB: Aug 29, 1955  Age: 64 y.o. MRN: [id number]  CC:  Chief Complaint  Patient presents with  . Diabetes  . Hypertension    HPI Andrew Wiggins presents for Chronic vsit.  Patient present with type 2 diabetes.  Specifically, this is type 2, no insulin requiring diabetes, complicated by neuropathy.  Compliance with treatment has been good; patient take medicines as directed, maintains diet and exercise regimen, follows up as directed, and is keeping glucose diary.  Date of  diagnosis 2018.  Depression screen has been performed.Tobacco screen nonsmoker. Current medicines for diabetes 3.  Patient is on lisinopril for renal protection and nothing for cholesterol control.  Patient performs foot exams daily and last ophthalmologic exam was long time..  Patient presents for follow up of hypertension.  Patient tolerating lisinopril well with side effects.  Patient was diagnosed with hypertension 2015 so has been treated for hypertension for 5 years.Patient is working on maintaining diet and exercise regimen and follows up as directed. Complication include none.  Past Medical History:  Diagnosis Date  . Abnormal chest x-ray   . AV block    history of  . Cardiac pacemaker in situ   . Chest wall pain   . COPD (chronic obstructive pulmonary disease) ([place])   . Dermatitis, contact    due to poison ivy  . GERD (gastroesophageal reflux disease)   . Headache(784.0)   . Hypertension   . Peripheral vascular disease ([place])   . Tobacco use disorder     Past Surgical History:  Procedure Laterality Date  . laproscopic cholecystectomy  2000   by Dr. Abran Cantor  . PACEMAKER INSERTION  03/2008   by Dr. Lovena Le    Family History  Problem Relation Age of Onset  . Diabetes Brother     Social History   Socioeconomic History  . Marital status: Legally Separated    Spouse name: Not on file  . Number of children: 2  .  Years of education: Not on file  . Highest education level: Not on file  Occupational History  . Occupation: unemployed    Comment: works for himself  Tobacco Use  . Smoking status: Current Every Day Smoker    Packs/day: 0.50    Years: 25.00    Pack years: 12.50    Types: Cigarettes  . Smokeless tobacco: Never Used  Substance and Sexual Activity  . Alcohol use: Not Currently    Alcohol/week: 0.0 standard drinks  . Drug use: No  . Sexual activity: Not Currently  Other Topics Concern  . Not on file  Social History Narrative   2 siblings died with leukemia around age 74   Social Determinants of Health   Financial Resource Strain:   . Difficulty of Paying Living Expenses: Not on file  Food Insecurity:   . Worried About Charity fundraiser in the Last Year: Not on file  . Ran Out of Food in the Last Year: Not on file  Transportation Needs:   . Lack of Transportation (Medical): Not on file  . Lack of Transportation (Non-Medical): Not on file  Physical Activity:   . Days of Exercise per Week: Not on file  . Minutes of Exercise per Session: Not on file  Stress:   . Feeling of Stress : Not on file  Social Connections:   . Frequency of Communication with Friends and Family: Not on file  .  Frequency of Social Gatherings with Friends and Family: Not on file  . Attends Religious Services: Not on file  . Active Member of Clubs or Organizations: Not on file  . Attends Banker Meetings: Not on file  . Marital Status: Not on file  Intimate Partner Violence:   . Fear of Current or Ex-Partner: Not on file  . Emotionally Abused: Not on file  . Physically Abused: Not on file  . Sexually Abused: Not on file    Outpatient Medications Prior to Visit  Medication Sig Dispense Refill  . aspirin 81 MG tablet Take 81 mg by mouth daily.      . Aspirin-Acetaminophen-Caffeine (GOODY HEADACHE PO) Take 1 packet by mouth daily as needed for headaches    . glimepiride (AMARYL) 4 MG  tablet Take 1 tablet (4 mg total) by mouth daily with breakfast. 90 tablet 3  . metFORMIN (GLUCOPHAGE-XR) 500 MG 24 hr tablet Take 1 tablet (500 mg total) by mouth daily with breakfast. 90 tablet 3  . lisinopril (PRINIVIL,ZESTRIL) 20 MG tablet Take 1 tablet (20 mg total) by mouth daily. 90 tablet 3   No facility-administered medications prior to visit.    No Known Allergies  ROS Review of Systems  Constitutional: Negative.   HENT: Negative.   Eyes: Negative.   Respiratory: Negative.   Cardiovascular: Negative.   Gastrointestinal: Negative.   Genitourinary: Negative.   Musculoskeletal: Positive for arthralgias.  Skin: Negative.   Neurological: Negative.   Psychiatric/Behavioral: Negative.       Objective:    Physical Exam  Constitutional: He is oriented to person, place, and time. He appears well-developed and well-nourished.  HENT:  Head: Normocephalic and atraumatic.  Eyes: Pupils are equal, round, and reactive to light. Conjunctivae and EOM are normal.  Cardiovascular: Normal rate, regular rhythm and normal heart sounds.  Pulmonary/Chest: Effort normal and breath sounds normal.  Abdominal: Soft. Bowel sounds are normal.  Musculoskeletal:        General: Normal range of motion.     Cervical back: Normal range of motion and neck supple.  Neurological: He is alert and oriented to person, place, and time. He has normal reflexes.  Skin: Skin is warm and dry.  Psychiatric: He has a normal mood and affect.  Vitals reviewed.   BP 136/64 (BP Location: Left Arm, Patient Position: Sitting)   Pulse 88   Temp (!) 96.7 F (35.9 C) (Temporal)   Resp 17   Ht 5\' 10"  (1.778 m)   Wt 154 lb (69.9 kg)   BMI 22.10 kg/m  Wt Readings from Last 3 Encounters:  06/20/19 154 lb (69.9 kg)  12/20/17 147 lb 3.2 oz (66.8 kg)  07/04/17 152 lb (68.9 kg)     Health Maintenance Due  Topic Date Due  . Hepatitis C Screening  17-Oct-1955  . OPHTHALMOLOGY EXAM  12/20/1964  . HIV Screening   12/20/1969  . TETANUS/TDAP  12/20/1973  . COLONOSCOPY  12/20/2004  . FOOT EXAM  02/07/2018  . INFLUENZA VACCINE  12/07/2018    There are no preventive care reminders to display for this patient.  Lab Results  Component Value Date   TSH 1.46 12/20/2017   Lab Results  Component Value Date   WBC 9.9 06/20/2019   HGB 16.1 06/20/2019   HCT 47.4 06/20/2019   MCV 93 06/20/2019   PLT 268 06/20/2019   Lab Results  Component Value Date   NA 140 06/20/2019   K 4.5 06/20/2019   CO2  23 06/20/2019   GLUCOSE 153 (H) 06/20/2019   BUN 12 06/20/2019   CREATININE 0.98 06/20/2019   BILITOT 0.4 06/20/2019   ALKPHOS 107 06/20/2019   AST 12 06/20/2019   ALT 14 06/20/2019   PROT 7.2 06/20/2019   ALBUMIN 4.3 06/20/2019   CALCIUM 9.4 06/20/2019   GFR 83.08 12/20/2017   Lab Results  Component Value Date   CHOL 148 06/20/2019   Lab Results  Component Value Date   HDL 30 (L) 06/20/2019   Lab Results  Component Value Date   LDLCALC 91 06/20/2019   Lab Results  Component Value Date   TRIG 151 (H) 06/20/2019   Lab Results  Component Value Date   CHOLHDL 4.9 06/20/2019   Lab Results  Component Value Date   HGBA1C 7.1 (H) 06/20/2019      Assessment & Plan:   Problem List Items Addressed This Visit      Cardiovascular and Mediastinum   Essential hypertension    An individual care plan was established and reinforced today.  The patient's status was assessed using clinical findings on exam and labs or diagnostic tests. The patient's success at meeting treatment goals on disease specific evidence-based guidelines and found to be GOOD controlled. SELF MANAGEMENT: The patient and I together assessed ways to personally work towards obtaining the recommended goals. RECOMMENDATIONS: avoid decongestants found in common cold remedies, decrease consumption of alcohol, perform routine monitoring of BP with home BP cuff, exercise, reduction of dietary salt, take medicines as prescribed, try  not to miss doses and quit smoking.  Regular exercise and maintaining a healthy weight is needed.  Stress reduction may help. A CLINICAL SUMMARY including written plan identify barriers to care unique to individual due to social or financial issues.  We attempt to mutually creat solutions for individual and family understanding.      Relevant Medications   lisinopril (ZESTRIL) 20 MG tablet     Endocrine   Diabetes mellitus with neuropathy [facility])    An individual care plan was established and reinforced today.  The patient's status was assessed using clinical findings on exam, labs and diagnostic testing. Patient success at meeting goals based on disease specific evidence-based guidelines and found to be GOOD controlled. Medications were assessed and patient's understanding of the medical issues , including barriers were assessed. Recommend adherence to a diabetic diet, a graduated exercise program, HgbA1c level is checked quarterly, and urine microalbumin performed yearly .  Annual mono-filament sensation testing performed. Lower blood pressure and control hyperlipidemia is important. Get annual eye exams and annual flu shots and smoking cessation discussed.  Self management goals were discussed.      Relevant Medications   lisinopril (ZESTRIL) 20 MG tablet     Other   Mixed hyperlipidemia    AN INDIVIDUAL CARE PLAN was established and reinforced today.  The patient's status was assessed using clinical findings on exam, lab and other diagnostic tests. The patient's disease status was assessed based on evidence-based guidelines and found to be GOOD controlled. MEDICATIONS were REVIEWED. SELF MANAGEMENT GOALS have been discussed and patient's success at attaining the goal of low cholesterol was assessed. RECOMMENDATION given include regular exercise 3 days a week and low cholesterol/low fat diet. CLINICAL SUMMARY including written plan to identify barriers unique to the patient due to social or  economic  reasons was discussed.      Relevant Medications   lisinopril (ZESTRIL) 20 MG tablet   Other Relevant Orders   Lipid Panel (  Completed)    Other Visit Diagnoses    Type 2 diabetes mellitus with other specified complication, without long-term current use of insulin (HCC)    -  Primary   Relevant Medications   lisinopril (ZESTRIL) 20 MG tablet   Other Relevant Orders   POCT UA - Microalbumin   CBC with Differential (Completed)   Comprehensive metabolic panel (Completed)   Hemoglobin A1c (Completed)      Meds ordered this encounter  Medications  . lisinopril (ZESTRIL) 20 MG tablet    Sig: Take 1 tablet (20 mg total) by mouth daily.    Dispense:  90 tablet    Refill:  2    Follow-up: Return in about 4 months (around 10/18/2019) for FASTING.    Brent Bulla, MD

## 2019-06-20 NOTE — Assessment & Plan Note (Signed)
An individual care plan was established and reinforced today.  The patient's status was assessed using clinical findings on exam and labs or diagnostic tests. The patient's success at meeting treatment goals on disease specific evidence-based guidelines and found to be GOOD controlled. SELF MANAGEMENT: The patient and I together assessed ways to personally work towards obtaining the recommended goals. RECOMMENDATIONS: avoid decongestants found in common cold remedies, decrease consumption of alcohol, perform routine monitoring of BP with home BP cuff, exercise, reduction of dietary salt, take medicines as prescribed, try not to miss doses and quit smoking.  Regular exercise and maintaining a healthy weight is needed.  Stress reduction may help. A CLINICAL SUMMARY including written plan identify barriers to care unique to individual due to social or financial issues.  We attempt to mutually creat solutions for individual and family understanding.

## 2019-06-21 LAB — COMPREHENSIVE METABOLIC PANEL
ALT: 14 IU/L (ref 0–44)
AST: 12 IU/L (ref 0–40)
Albumin/Globulin Ratio: 1.5 (ref 1.2–2.2)
Albumin: 4.3 g/dL (ref 3.8–4.8)
Alkaline Phosphatase: 107 IU/L (ref 39–117)
BUN/Creatinine Ratio: 12 (ref 10–24)
BUN: 12 mg/dL (ref 8–27)
Bilirubin Total: 0.4 mg/dL (ref 0.0–1.2)
CO2: 23 mmol/L (ref 20–29)
Calcium: 9.4 mg/dL (ref 8.6–10.2)
Chloride: 102 mmol/L (ref 96–106)
Creatinine, Ser: 0.98 mg/dL (ref 0.76–1.27)
GFR calc Af Amer: 94 mL/min/{1.73_m2} (ref >59)
GFR calc non Af Amer: 81 mL/min/{1.73_m2} (ref >59)
Globulin, Total: 2.9 g/dL (ref 1.5–4.5)
Glucose: 153 mg/dL — ABNORMAL HIGH (ref 65–99)
Potassium: 4.5 mmol/L (ref 3.5–5.2)
Sodium: 140 mmol/L (ref 134–144)
Total Protein: 7.2 g/dL (ref 6.0–8.5)

## 2019-06-21 LAB — LIPID PANEL
Chol/HDL Ratio: 4.9 ratio (ref 0.0–5.0)
Cholesterol, Total: 148 mg/dL (ref 100–199)
HDL: 30 mg/dL — ABNORMAL LOW (ref >39)
LDL Chol Calc (NIH): 91 mg/dL (ref 0–99)
Triglycerides: 151 mg/dL — ABNORMAL HIGH (ref 0–149)
VLDL Cholesterol Cal: 27 mg/dL (ref 5–40)

## 2019-06-21 LAB — CBC WITH DIFFERENTIAL/PLATELET
Basophils Absolute: 0 10*3/uL (ref 0.0–0.2)
Basos: 0 %
EOS (ABSOLUTE): 0.2 10*3/uL (ref 0.0–0.4)
Eos: 2 %
Hematocrit: 47.4 % (ref 37.5–51.0)
Hemoglobin: 16.1 g/dL (ref 13.0–17.7)
Immature Grans (Abs): 0.1 10*3/uL (ref 0.0–0.1)
Immature Granulocytes: 1 %
Lymphocytes Absolute: 2.4 10*3/uL (ref 0.7–3.1)
Lymphs: 25 %
MCH: 31.7 pg (ref 26.6–33.0)
MCHC: 34 g/dL (ref 31.5–35.7)
MCV: 93 fL (ref 79–97)
Monocytes Absolute: 0.8 10*3/uL (ref 0.1–0.9)
Monocytes: 8 %
Neutrophils Absolute: 6.4 10*3/uL (ref 1.4–7.0)
Neutrophils: 64 %
Platelets: 268 10*3/uL (ref 150–450)
RBC: 5.08 x10E6/uL (ref 4.14–5.80)
RDW: 12.6 % (ref 11.6–15.4)
WBC: 9.9 10*3/uL (ref 3.4–10.8)

## 2019-06-21 LAB — CARDIOVASCULAR RISK ASSESSMENT

## 2019-06-21 LAB — HEMOGLOBIN A1C
Est. average glucose Bld gHb Est-mCnc: 157 mg/dL
Hgb A1c MFr Bld: 7.1 % — ABNORMAL HIGH (ref 4.8–5.6)

## 2019-06-21 NOTE — Progress Notes (Signed)
No anemia, glucose high 153, kidney and liver tests ok, triglycerides high- need to watch diet, A1c 7.1 lp

## 2019-11-08 DIAGNOSIS — M545 Low back pain: Secondary | ICD-10-CM | POA: Diagnosis not present

## 2019-11-08 DIAGNOSIS — M5430 Sciatica, unspecified side: Secondary | ICD-10-CM | POA: Diagnosis not present

## 2019-11-08 DIAGNOSIS — M5416 Radiculopathy, lumbar region: Secondary | ICD-10-CM | POA: Diagnosis not present

## 2019-11-17 ENCOUNTER — Telehealth: Payer: Self-pay

## 2019-11-17 ENCOUNTER — Other Ambulatory Visit: Payer: Self-pay | Admitting: Legal Medicine

## 2019-11-17 NOTE — Telephone Encounter (Signed)
Pt left a message right before lunch requesting an appointment. He stating in the message that 1 week ago his back went out and he was seen at [facility] on Sunday a week ago. They  Gave him 3 medicines and a shot. He stated in the message that nothing has helped with his pain. He was wanting to see about getting an X-ray. I tried to call the pt back to set up an appointment with Dr. Marina Goodell. I left a message asking the pt to call back to schedule.

## 2019-11-18 ENCOUNTER — Ambulatory Visit (INDEPENDENT_AMBULATORY_CARE_PROVIDER_SITE_OTHER): Payer: PPO | Admitting: Legal Medicine

## 2019-11-18 ENCOUNTER — Other Ambulatory Visit: Payer: Self-pay

## 2019-11-18 ENCOUNTER — Encounter: Payer: Self-pay | Admitting: Legal Medicine

## 2019-11-18 VITALS — BP 180/86 | HR 104 | Temp 97.4°F | Resp 16 | Ht 70.0 in | Wt 150.0 lb

## 2019-11-18 DIAGNOSIS — M5430 Sciatica, unspecified side: Secondary | ICD-10-CM | POA: Insufficient documentation

## 2019-11-18 DIAGNOSIS — M5432 Sciatica, left side: Secondary | ICD-10-CM | POA: Diagnosis not present

## 2019-11-18 DIAGNOSIS — K219 Gastro-esophageal reflux disease without esophagitis: Secondary | ICD-10-CM

## 2019-11-18 DIAGNOSIS — M5442 Lumbago with sciatica, left side: Secondary | ICD-10-CM

## 2019-11-18 DIAGNOSIS — E782 Mixed hyperlipidemia: Secondary | ICD-10-CM | POA: Diagnosis not present

## 2019-11-18 DIAGNOSIS — Z950 Presence of cardiac pacemaker: Secondary | ICD-10-CM | POA: Diagnosis not present

## 2019-11-18 DIAGNOSIS — G8929 Other chronic pain: Secondary | ICD-10-CM

## 2019-11-18 DIAGNOSIS — I779 Disorder of arteries and arterioles, unspecified: Secondary | ICD-10-CM | POA: Diagnosis not present

## 2019-11-18 DIAGNOSIS — I10 Essential (primary) hypertension: Secondary | ICD-10-CM

## 2019-11-18 DIAGNOSIS — E1140 Type 2 diabetes mellitus with diabetic neuropathy, unspecified: Secondary | ICD-10-CM

## 2019-11-18 HISTORY — DX: Sciatica, unspecified side: M54.30

## 2019-11-18 MED ORDER — TRAMADOL HCL 50 MG PO TABS: 50.0000 mg | ORAL_TABLET | Freq: Three times a day (TID) | ORAL | 2 refills | Status: DC | PRN

## 2019-11-18 NOTE — Progress Notes (Signed)
Subjective:  Patient ID: Andrew Wiggins, male    DOB: 03/21/1956  Age: 64 y.o. MRN: [id number]  Chief Complaint  Patient presents with  . Hyperlipidemia  . Hypertension  . Gastroesophageal Reflux  . Diabetes    HPI: Chronic  Visit.  Chronic LBP increased recent 3 weeks, no injury.  No back doctor  Patient present with type 2 diabetes.  Specifically, this is type 2, noninsulin requiring diabetes, complicated by neuropathy.  Compliance with treatment has been good; patient take medicines as directed, maintains diet and exercise regimen, follows up as directed, and is keeping glucose diary.  Date of  diagnosis 2010.  Depression screen has been performed.Tobacco screen smoker. Current medicines for diabetes metformin and glimipiride.  Patient is lisinopril none for renal protection and none for cholesterol control.  Patient performs foot exams daily and last ophthalmologic exam was not this year  Patient presents for follow up of hypertension.  Patient tolerating lisinopril well with side effects.  Patient was diagnosed with hypertension 2010 so has been treated for hypertension for 10 years.Patient is working on maintaining diet and exercise regimen and follows up as directed. Complication include none.  Patient presents with hyperlipidemia.  Compliance with treatment has been good; patient takes medicines as directed, maintains low cholesterol diet, follows up as directed, and maintains exercise regimen.  Patient is using none without problems.  Patient has gastroesophageal reflux symptoms withesophagitis and LTRD.  The symptoms are moderte intensity.  Length of symptoms 10 years.  Medicines include OTC.  Complications include none..   Current Outpatient Medications on File Prior to Visit  Medication Sig Dispense Refill  . aspirin 81 MG tablet Take 81 mg by mouth daily.      . Aspirin-Acetaminophen-Caffeine (GOODY HEADACHE PO) Take 1 packet by mouth daily as needed for headaches    .  glimepiride (AMARYL) 4 MG tablet Take 1 tablet by mouth once daily 90 tablet 2  . ketorolac (TORADOL) 10 MG tablet Take 10 mg by mouth 4 (four) times daily as needed.    [name] lisinopril (ZESTRIL) 20 MG tablet Take 1 tablet (20 mg total) by mouth daily. 90 tablet 2  . metFORMIN (GLUCOPHAGE-XR) 500 MG 24 hr tablet TAKE 1 TABLET BY MOUTH ONCE DAILY WITH EVENING MEAL 90 tablet 2  . pregabalin (LYRICA) 50 MG capsule Take 50 mg by mouth 2 (two) times daily.     No current facility-administered medications on file prior to visit.   Past Medical History:  Diagnosis Date  . AV block    history of  . Cardiac pacemaker in situ   . Dermatitis, contact    due to poison ivy  . GERD (gastroesophageal reflux disease)   . Headache(784.0)   . Hypertension   . Peripheral vascular disease (HCC)   . Tobacco use disorder    Past Surgical History:  Procedure Laterality Date  . laproscopic cholecystectomy  2000   by Dr. Odie Sera  . PACEMAKER INSERTION  03/2008   by Dr. Ladona Ridgel    Family History  Problem Relation Age of Onset  . Diabetes Brother    Social History   Socioeconomic History  . Marital status: Legally Separated    Spouse name: Not on file  . Number of children: 2  . Years of education: Not on file  . Highest education level: Not on file  Occupational History  . Occupation: unemployed    Comment: works for himself  Tobacco Use  . Smoking status: Current Every  Day Smoker    Packs/day: 0.50    Years: 25.00    Pack years: 12.50    Types: Cigarettes  . Smokeless tobacco: Never Used  Substance and Sexual Activity  . Alcohol use: Not Currently    Alcohol/week: 0.0 standard drinks  . Drug use: No  . Sexual activity: Not Currently  Other Topics Concern  . Not on file  Social History Narrative   2 siblings died with leukemia around age 65   Social Determinants of Health   Financial Resource Strain:   . Difficulty of Paying Living Expenses:   Food Insecurity:   . Worried About  Programme researcher, broadcasting/film/videounning Out of Food in the Last Year:   . Baristaan Out of Food in the Last Year:   Transportation Needs:   . Freight forwarderLack of Transportation (Medical):   [name]. Lack of Transportation (Non-Medical):   Physical Activity:   . Days of Exercise per Week:   . Minutes of Exercise per Session:   Stress:   . Feeling of Stress :   Social Connections:   . Frequency of Communication with Friends and Family:   . Frequency of Social Gatherings with Friends and Family:   . Attends Religious Services:   . Active Member of Clubs or Organizations:   . Attends BankerClub or Organization Meetings:   [name]. Marital Status:     Review of Systems  Constitutional: Negative.   HENT: Negative.   Cardiovascular: Negative.   Gastrointestinal: Negative.   Musculoskeletal: Positive for back pain.  Neurological: Negative.   Hematological: Negative.   Psychiatric/Behavioral: Negative.      Objective:  BP (!) 180/86   Pulse (!) 104   Temp (!) 97.4 F (36.3 C)   Resp 16   Ht 5\' 10"  (1.778 m)   Wt 150 lb (68 kg)   SpO2 97%   BMI 21.52 kg/m   BP/Weight 11/18/2019 06/20/2019 12/20/2017  Systolic BP 180 136 144  Diastolic BP 86 64 82  Wt. (Lbs) 150 154 147.2  BMI 21.52 22.1 21.9    Physical Exam Vitals reviewed.  Constitutional:      Appearance: Normal appearance.  HENT:     Head: Normocephalic and atraumatic.     Right Ear: Tympanic membrane, ear canal and external ear normal.     Left Ear: Tympanic membrane, ear canal and external ear normal.     Nose: Nose normal.     Mouth/Throat:     Mouth: Mucous membranes are dry.  Eyes:     Extraocular Movements: Extraocular movements intact.     Conjunctiva/sclera: Conjunctivae normal.     Pupils: Pupils are equal, round, and reactive to light.  Cardiovascular:     Rate and Rhythm: Normal rate and regular rhythm.     Pulses: Normal pulses.     Heart sounds: Normal heart sounds.  Pulmonary:     Effort: Pulmonary effort is normal.     Breath sounds: Normal breath sounds.    Abdominal:     General: Abdomen is flat.     Palpations: Abdomen is soft.  Musculoskeletal:     Cervical back: Normal range of motion and neck supple.     Lumbar back: Tenderness present.       Back:     Comments: Positive SLR on left, pain radiated down left leg  Skin:    Capillary Refill: Capillary refill takes 2 to 3 seconds.  Neurological:     General: No focal deficit present.     Mental  Status: He is alert and oriented to person, place, and time.  Psychiatric:        Mood and Affect: Mood normal.        Behavior: Behavior normal.        Thought Content: Thought content normal.        Judgment: Judgment normal.     Diabetic Foot Exam - Simple   Simple Foot Form Diabetic Foot exam was performed with the following findings: Yes 11/18/2019 10:13 AM  Visual Inspection See comments: Yes Sensation Testing See comments: Yes Pulse Check Posterior Tibialis and Dorsalis pulse intact bilaterally: Yes Comments onchomycosis , decreasing sensation to monofilament      Lab Results  Component Value Date   WBC 9.9 06/20/2019   HGB 16.1 06/20/2019   HCT 47.4 06/20/2019   PLT 268 06/20/2019   GLUCOSE 153 (H) 06/20/2019   CHOL 148 06/20/2019   TRIG 151 (H) 06/20/2019   HDL 30 (L) 06/20/2019   LDLDIRECT 113.0 12/20/2017   LDLCALC 91 06/20/2019   ALT 14 06/20/2019   AST 12 06/20/2019   NA 140 06/20/2019   K 4.5 06/20/2019   CL 102 06/20/2019   CREATININE 0.98 06/20/2019   BUN 12 06/20/2019   CO2 23 06/20/2019   TSH 1.46 12/20/2017   PSA 0.63 12/20/2017   HGBA1C 7.1 (H) 06/20/2019   MICROALBUR 2.9 (H) 02/07/2017      Assessment & Plan:    Andrew Wiggins was seen today for hyperlipidemia, hypertension, gastroesophageal reflux and diabetes.  Diagnoses and all orders for this visit:  Mixed hyperlipidemia -     Lipid panel AN INDIVIDUAL CARE PLAN for hyperlipidemia/ cholesterol was established and reinforced today.  The patient's status was assessed using clinical findings  on exam, lab and other diagnostic tests. The patient's disease status was assessed based on evidence-based guidelines and found to be well controlled. MEDICATIONS were reviewed. SELF MANAGEMENT GOALS have been discussed and patient's success at attaining the goal of low cholesterol was assessed. RECOMMENDATION given include regular exercise 3 days a week and low cholesterol/low fat diet. CLINICAL SUMMARY including written plan to identify barriers unique to the patient due to social or economic  reasons was discussed.  Type 2 diabetes mellitus with diabetic neuropathy, without long-term current use of insulin (HCC) -     Hemoglobin A1c An individual care plan for diabetes was established and reinforced today.  The patient's status was assessed using clinical findings on exam, labs and diagnostic testing. Patient success at meeting goals based on disease specific evidence-based guidelines and found to be good controlled. Medications were assessed and patient's understanding of the medical issues , including barriers were assessed. Recommend adherence to a diabetic diet, a graduated exercise program, HgbA1c level is checked quarterly, and urine microalbumin performed yearly .  Annual mono-filament sensation testing performed. Lower blood pressure and control hyperlipidemia is important. Get annual eye exams and annual flu shots and smoking cessation discussed.  Self management goals were discussed.  Bilateral carotid artery disease, unspecified type (HCC) This is stable and no TIAs  Gastroesophageal reflux disease without esophagitis Plan of care was formulated today.  She is doing well.  A plan of care was formulated using patient exam, tests and other sources to optimize care using evidence based information.  Recommend no smoking, no eating after supper, avoid fatty foods, elevate Head of bed, avoid tight fitting clothing.  Continue on omeprazole.  Essential hypertension -     CBC with  Differential/Platelet -  Comprehensive metabolic panel An individual hypertension care plan was established and reinforced today.  The patient's status was assessed using clinical findings on exam and labs or diagnostic tests. The patient's success at meeting treatment goals on disease specific evidence-based guidelines and found to be well controlled. SELF MANAGEMENT: The patient and I together assessed ways to personally work towards obtaining the recommended goals. RECOMMENDATIONS: avoid decongestants found in common cold remedies, decrease consumption of alcohol, perform routine monitoring of BP with home BP cuff, exercise, reduction of dietary salt, take medicines as prescribed, try not to miss doses and quit smoking.  Regular exercise and maintaining a healthy weight is needed.  Stress reduction may help. A CLINICAL SUMMARY including written plan identify barriers to care unique to individual due to social or financial issues.  We attempt to mutually creat solutions for individual and family understanding.  Chronic left-sided low back pain with left-sided sciatica -     traMADol (ULTRAM) 50 MG tablet; Take 1 tablet (50 mg total) by mouth 3 (three) times daily as needed. -     Ambulatory referral to Orthopedic Surgery Patient has a history of back pain and went to ER for it.  Tramadol helped,  Refer to orthoipedics  Cardiac pacemaker in situ -     Ambulatory referral to Cardiology Pacemaker working well     Meds ordered this encounter  Medications  . traMADol (ULTRAM) 50 MG tablet    Sig: Take 1 tablet (50 mg total) by mouth 3 (three) times daily as needed.    Dispense:  60 tablet    Refill:  2    Orders Placed This Encounter  Procedures  . CBC with Differential/Platelet  . Comprehensive metabolic panel  . Hemoglobin A1c  . Lipid panel  . Ambulatory referral to Orthopedic Surgery  . Ambulatory referral to Cardiology     Follow-up: Return in about 4 months (around  03/20/2020) for fasting.  An After Visit Summary was printed and given to the patient.  Brent Bulla [facility] [phone]

## 2019-11-19 LAB — COMPREHENSIVE METABOLIC PANEL
ALT: 14 IU/L (ref 0–44)
AST: 14 IU/L (ref 0–40)
Albumin/Globulin Ratio: 1.7 (ref 1.2–2.2)
Albumin: 4.3 g/dL (ref 3.8–4.8)
Alkaline Phosphatase: 98 IU/L (ref 48–121)
BUN/Creatinine Ratio: 13 (ref 10–24)
BUN: 12 mg/dL (ref 8–27)
Bilirubin Total: 0.5 mg/dL (ref 0.0–1.2)
CO2: 23 mmol/L (ref 20–29)
Calcium: 9.5 mg/dL (ref 8.6–10.2)
Chloride: 104 mmol/L (ref 96–106)
Creatinine, Ser: 0.89 mg/dL (ref 0.76–1.27)
GFR calc Af Amer: 104 mL/min/{1.73_m2} (ref >59)
GFR calc non Af Amer: 90 mL/min/{1.73_m2} (ref >59)
Globulin, Total: 2.5 g/dL (ref 1.5–4.5)
Glucose: 139 mg/dL — ABNORMAL HIGH (ref 65–99)
Potassium: 4.2 mmol/L (ref 3.5–5.2)
Sodium: 142 mmol/L (ref 134–144)
Total Protein: 6.8 g/dL (ref 6.0–8.5)

## 2019-11-19 LAB — CARDIOVASCULAR RISK ASSESSMENT

## 2019-11-19 LAB — HEMOGLOBIN A1C
Est. average glucose Bld gHb Est-mCnc: 163 mg/dL
Hgb A1c MFr Bld: 7.3 % — ABNORMAL HIGH (ref 4.8–5.6)

## 2019-11-19 LAB — CBC WITH DIFFERENTIAL/PLATELET
Basophils Absolute: 0.1 10*3/uL (ref 0.0–0.2)
Basos: 1 %
EOS (ABSOLUTE): 0.1 10*3/uL (ref 0.0–0.4)
Eos: 1 %
Hematocrit: 46.1 % (ref 37.5–51.0)
Hemoglobin: 15.7 g/dL (ref 13.0–17.7)
Immature Grans (Abs): 0.1 10*3/uL (ref 0.0–0.1)
Immature Granulocytes: 1 %
Lymphocytes Absolute: 2.5 10*3/uL (ref 0.7–3.1)
Lymphs: 24 %
MCH: 31 pg (ref 26.6–33.0)
MCHC: 34.1 g/dL (ref 31.5–35.7)
MCV: 91 fL (ref 79–97)
Monocytes Absolute: 0.7 10*3/uL (ref 0.1–0.9)
Monocytes: 7 %
Neutrophils Absolute: 6.9 10*3/uL (ref 1.4–7.0)
Neutrophils: 66 %
Platelets: 267 10*3/uL (ref 150–450)
RBC: 5.07 x10E6/uL (ref 4.14–5.80)
RDW: 13.1 % (ref 11.6–15.4)
WBC: 10.4 10*3/uL (ref 3.4–10.8)

## 2019-11-19 LAB — LIPID PANEL
Chol/HDL Ratio: 4.5 ratio (ref 0.0–5.0)
Cholesterol, Total: 150 mg/dL (ref 100–199)
HDL: 33 mg/dL — ABNORMAL LOW (ref >39)
LDL Chol Calc (NIH): 97 mg/dL (ref 0–99)
Triglycerides: 108 mg/dL (ref 0–149)
VLDL Cholesterol Cal: 20 mg/dL (ref 5–40)

## 2019-11-19 NOTE — Progress Notes (Signed)
CBC normal, glucose 139, kidney and liver tests normal A1c 7.3 OK, cholesterol normal lp

## 2019-11-20 ENCOUNTER — Ambulatory Visit: Payer: PPO | Admitting: Cardiology

## 2019-11-20 ENCOUNTER — Other Ambulatory Visit: Payer: Self-pay

## 2019-11-20 ENCOUNTER — Encounter: Payer: Self-pay | Admitting: Cardiology

## 2019-11-20 VITALS — BP 178/88 | HR 96 | Ht 70.0 in | Wt 148.8 lb

## 2019-11-20 DIAGNOSIS — R001 Bradycardia, unspecified: Secondary | ICD-10-CM

## 2019-11-20 DIAGNOSIS — I10 Essential (primary) hypertension: Secondary | ICD-10-CM | POA: Diagnosis not present

## 2019-11-20 DIAGNOSIS — E1169 Type 2 diabetes mellitus with other specified complication: Secondary | ICD-10-CM | POA: Diagnosis not present

## 2019-11-20 MED ORDER — LISINOPRIL 20 MG PO TABS: 20.0000 mg | ORAL_TABLET | Freq: Two times a day (BID) | ORAL | 2 refills | Status: DC

## 2019-11-20 NOTE — Patient Instructions (Signed)
Medication Instructions:  Your physician has recommended you make the following change in your medication:   Increase your Lisinopril to 20 mg twice daily.  *If you need a refill on your cardiac medications before your next appointment, please call your pharmacy*   Lab Work: None ordered If you have labs (blood work) drawn today and your tests are completely normal, you will receive your results only by: [name] MyChart Message (if you have MyChart) OR . A paper copy in the mail If you have any lab test that is abnormal or we need to change your treatment, we will call you to review the results.   Testing/Procedures: None ordered   Follow-Up: At [facility], you and your health needs are our priority.  As part of our continuing mission to provide you with exceptional heart care, we have created designated Provider Care Teams.  These Care Teams include your primary Cardiologist (physician) and Advanced Practice Providers (APPs -  Physician Assistants and Nurse Practitioners) who all work together to provide you with the care you need, when you need it.  We recommend signing up for the patient portal called "MyChart".  Sign up information is provided on this After Visit Summary.  MyChart is used to connect with patients for Virtual Visits (Telemedicine).  Patients are able to view lab/test results, encounter notes, upcoming appointments, etc.  Non-urgent messages can be sent to your provider as well.   To learn more about what you can do with MyChart, go to [email or web address].    Your next appointment:   1 month(s)  The format for your next appointment:   In Person  Provider:   Thomasene Ripple, DO   Other Instructions NA

## 2019-11-20 NOTE — Progress Notes (Signed)
Cardiology Office Note:    Date:  11/20/2019   ID:  Andrew Wiggins, DOB 09-30-1955, MRN [id number]  PCP:  Abigail Miyamoto, MD  Cardiologist:  Thomasene Ripple, DO  Electrophysiologist:  None   Referring MD: Abigail Miyamoto,*   " I am here to start seeing cardiology again"  History of Present Illness:    Andrew Wiggins is a 64 y.o. male with a hx of complete heart block status post pacemaker, hypertension (will also concern for whitecoat hypertension as noted in his chart), COPD presents today to reestablish cardiac care.  The patient used to follow Dr. Sharrell Ku in [place] for his cardiovascular care.  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  He only has been following up with his primary care doctor who recently requested that the patient get back in touch with cardiology.  Today he denies any chest pain, shortness of breath, nausea, vomiting.  Past Medical History:  Diagnosis Date  . AV block    history of  . Cardiac pacemaker in situ   . Dermatitis, contact    due to poison ivy  . GERD (gastroesophageal reflux disease)   . Headache(784.0)   . Hypertension   . Peripheral vascular disease (HCC)   . Tobacco use disorder     Past Surgical History:  Procedure Laterality Date  . laproscopic cholecystectomy  2000   by Dr. Odie Sera  . PACEMAKER INSERTION  03/2008   by Dr. Ladona Ridgel    Current Medications: Current Meds  Medication Sig  . aspirin 81 MG tablet Take 81 mg by mouth daily.    . Aspirin-Acetaminophen-Caffeine (GOODY HEADACHE PO) Take 1 packet by mouth daily as needed for headaches  . glimepiride (AMARYL) 4 MG tablet Take 1 tablet by mouth once daily  . lisinopril (ZESTRIL) 20 MG tablet Take 1 tablet (20 mg total) by mouth in the morning and at bedtime.  . metFORMIN (GLUCOPHAGE-XR) 500 MG 24 hr tablet  TAKE 1 TABLET BY MOUTH ONCE DAILY WITH EVENING MEAL  . traMADol (ULTRAM) 50 MG tablet Take 1 tablet (50 mg total) by mouth 3 (three) times daily as needed.  . [DISCONTINUED] lisinopril (ZESTRIL) 20 MG tablet Take 1 tablet (20 mg total) by mouth daily.     Allergies:   Patient has no known allergies.   Social History   Socioeconomic History  . Marital status: Legally Separated    Spouse name: Not on file  . Number of children: 2  . Years of education: Not on file  . Highest education level: Not on file  Occupational History  . Occupation: unemployed    Comment: works for himself  Tobacco Use  . Smoking status: Current Every Day Smoker    Packs/day: 0.50    Years: 25.00    Pack years: 12.50    Types: Cigarettes  . Smokeless tobacco: Never Used  Substance and Sexual Activity  . Alcohol use: Not Currently    Alcohol/week: 0.0 standard drinks  . Drug use: No  . Sexual activity: Not Currently  Other Topics Concern  . Not on file  Social History Narrative   2 siblings died with leukemia around age 66   Social Determinants of Health   Financial Resource Strain:   . Difficulty of Paying Living Expenses:   Food Insecurity:   . Worried  About Running Out of Food in the Last Year:   . Ran Out of Food in the Last Year:   Transportation Needs:   . Lack of Transportation (Medical):   [name] Lack of Transportation (Non-Medical):   Physical Activity:   . Days of Exercise per Week:   . Minutes of Exercise per Session:   Stress:   . Feeling of Stress :   Social Connections:   . Frequency of Communication with Friends and Family:   . Frequency of Social Gatherings with Friends and Family:   . Attends Religious Services:   . Active Member of Clubs or Organizations:   . Attends Banker Meetings:   [name] Marital Status:      Family History: The patient's family history includes Diabetes in his brother.  ROS:   Review of Systems  Constitution: Negative for decreased  appetite, fever and weight gain.  HENT: Negative for congestion, ear discharge, hoarse voice and sore throat.   Eyes: Negative for discharge, redness, vision loss in right eye and visual halos.  Cardiovascular: Negative for chest pain, dyspnea on exertion, leg swelling, orthopnea and palpitations.  Respiratory: Negative for cough, hemoptysis, shortness of breath and snoring.   Endocrine: Negative for heat intolerance and polyphagia.  Hematologic/Lymphatic: Negative for bleeding problem. Does not bruise/bleed easily.  Skin: Negative for flushing, nail changes, rash and suspicious lesions.  Musculoskeletal: Negative for arthritis, joint pain, muscle cramps, myalgias, neck pain and stiffness.  Gastrointestinal: Negative for abdominal pain, bowel incontinence, diarrhea and excessive appetite.  Genitourinary: Negative for decreased libido, genital sores and incomplete emptying.  Neurological: Negative for brief paralysis, focal weakness, headaches and loss of balance.  Psychiatric/Behavioral: Negative for altered mental status, depression and suicidal ideas.  Allergic/Immunologic: Negative for HIV exposure and persistent infections.    EKGs/Labs/Other Studies Reviewed:    The following studies were reviewed today:   EKG:  The ekg ordered today demonstrates atrial synchrony with ventricular paced rhythm.  Recent Labs: 11/18/2019: ALT 14; BUN 12; Creatinine, Ser 0.89; Hemoglobin 15.7; Platelets 267; Potassium 4.2; Sodium 142  Recent Lipid Panel    Component Value Date/Time   CHOL 150 11/18/2019 1023   TRIG 108 11/18/2019 1023   HDL 33 (L) 11/18/2019 1023   CHOLHDL 4.5 11/18/2019 1023   CHOLHDL 5 12/20/2017 1320   VLDL 44.0 (H) 12/20/2017 1320   LDLCALC 97 11/18/2019 1023   LDLDIRECT 113.0 12/20/2017 1320    Physical Exam:    VS:  BP (!) 178/88   Pulse 96   Ht 5\' 10"  (1.778 m)   Wt 148 lb 12.8 oz (67.5 kg)   SpO2 94%   BMI 21.35 kg/m     Wt Readings from Last 3 Encounters:    11/20/19 148 lb 12.8 oz (67.5 kg)  11/18/19 150 lb (68 kg)  06/20/19 154 lb (69.9 kg)     GEN: Well nourished, well developed in no acute distress HEENT: Normal NECK: No JVD; No carotid bruits LYMPHATICS: No lymphadenopathy CARDIAC: S1S2 noted,RRR, no murmurs, rubs, gallops RESPIRATORY:  Clear to auscultation without rales, wheezing or rhonchi  ABDOMEN: Soft, non-tender, non-distended, +bowel sounds, no guarding. EXTREMITIES: No edema, No cyanosis, no clubbing MUSCULOSKELETAL:  No deformity  SKIN: Warm and dry NEUROLOGIC:  Alert and oriented x 3, non-focal PSYCHIATRIC:  Normal affect, good insight  ASSESSMENT:    1. Sinus bradycardia   2. Type 2 diabetes mellitus with other specified complication, without long-term current use of insulin (HCC)   3. Hypertension,  unspecified type    PLAN:     He is hypertensive in the office today manually his blood pressure systolic 170/88 mmHg.  At this time he takes lisinopril 20 mg daily I like to increase this to twice a day and have him come back in a month.  He does not have an ability to take his blood pressure at home.  His EKG today shows V paced rhythm.  I like to send the patient to EP to reestablish care.    Diabetes mellitus-continue care per PCP.  The patient is in agreement with the above plan. The patient left the office in stable condition.  The patient will follow up in   Medication Adjustments/Labs and Tests Ordered: Current medicines are reviewed at length with the patient today.  Concerns regarding medicines are outlined above.  Orders Placed This Encounter  Procedures  . Ambulatory referral to Cardiac Electrophysiology  . EKG 12-Lead   Meds ordered this encounter  Medications  . lisinopril (ZESTRIL) 20 MG tablet    Sig: Take 1 tablet (20 mg total) by mouth in the morning and at bedtime.    Dispense:  180 tablet    Refill:  2    Patient Instructions  Medication Instructions:  Your physician has recommended  you make the following change in your medication:   Increase your Lisinopril to 20 mg twice daily.  *If you need a refill on your cardiac medications before your next appointment, please call your pharmacy*   Lab Work: None ordered If you have labs (blood work) drawn today and your tests are completely normal, you will receive your results only by: [name]. MyChart Message (if you have MyChart) OR . A paper copy in the mail If you have any lab test that is abnormal or we need to change your treatment, we will call you to review the results.   Testing/Procedures: None ordered   Follow-Up: At [facility], you and your health needs are our priority.  As part of our continuing mission to provide you with exceptional heart care, we have created designated Provider Care Teams.  These Care Teams include your primary Cardiologist (physician) and Advanced Practice Providers (APPs -  Physician Assistants and Nurse Practitioners) who all work together to provide you with the care you need, when you need it.  We recommend signing up for the patient portal called "MyChart".  Sign up information is provided on this After Visit Summary.  MyChart is used to connect with patients for Virtual Visits (Telemedicine).  Patients are able to view lab/test results, encounter notes, upcoming appointments, etc.  Non-urgent messages can be sent to your provider as well.   To learn more about what you can do with MyChart, go to [email or web address].    Your next appointment:   1 month(s)  The format for your next appointment:   In Person  Provider:   Thomasene RippleKardie Abdulaziz Toman, DO   Other Instructions NA    Adopting a Healthy Lifestyle.  Know what a healthy weight is for you (roughly BMI <25) and aim to maintain this   Aim for 7+ servings of fruits and vegetables daily   65-80+ fluid ounces of water or unsweet tea for healthy kidneys   Limit to max 1 drink of alcohol per day; avoid smoking/tobacco   Limit  animal fats in diet for cholesterol and heart health - choose grass fed whenever available   Avoid highly processed foods, and foods high in saturated/trans fats   Aim  for low stress - take time to unwind and care for your mental health   Aim for 150 min of moderate intensity exercise weekly for heart health, and weights twice weekly for bone health   Aim for 7-9 hours of sleep daily   When it comes to diets, agreement about the perfect plan isnt easy to find, even among the experts. Experts at the [facility] of [employer] developed an idea known as the Healthy Eating Plate. Just imagine a plate divided into logical, healthy portions.   The emphasis is on diet quality:   Load up on vegetables and fruits - one-half of your plate: Aim for color and variety, and remember that potatoes dont count.   Go for whole grains - one-quarter of your plate: Whole wheat, barley, wheat berries, quinoa, oats, brown rice, and foods made with them. If you want pasta, go with whole wheat pasta.   Protein power - one-quarter of your plate: Fish, chicken, beans, and nuts are all healthy, versatile protein sources. Limit red meat.   The diet, however, does go beyond the plate, offering a few other suggestions.   Use healthy plant oils, such as olive, canola, soy, corn, sunflower and peanut. Check the labels, and avoid partially hydrogenated oil, which have unhealthy trans fats.   If youre thirsty, drink water. Coffee and tea are good in moderation, but skip sugary drinks and limit milk and dairy products to one or two daily servings.   The type of carbohydrate in the diet is more important than the amount. Some sources of carbohydrates, such as vegetables, fruits, whole grains, and beans-are healthier than others.   Finally, stay active  Signed, Thomasene Ripple, DO  11/20/2019 2:20 PM    [facility]

## 2019-11-27 ENCOUNTER — Telehealth: Payer: Self-pay

## 2019-11-27 ENCOUNTER — Other Ambulatory Visit: Payer: Self-pay | Admitting: Legal Medicine

## 2019-11-27 DIAGNOSIS — M5432 Sciatica, left side: Secondary | ICD-10-CM

## 2019-11-27 MED ORDER — HYDROCODONE-ACETAMINOPHEN 10-325 MG PO TABS: 1.0000 | ORAL_TABLET | Freq: Three times a day (TID) | ORAL | 0 refills | Status: AC | PRN

## 2019-11-27 NOTE — Telephone Encounter (Signed)
      Patient reported that back pain is getting worse and is struggling to move around.  He is taking the Tramadol 50 mg TID with minimal results.  He got an appointment with Ortho however the earliest he can be seen is August 3.  Please advise if there is anything else he can do in the mean time.  Creola Corn, LPN 30/86/57 84:69 AM

## 2019-12-09 ENCOUNTER — Telehealth: Payer: Self-pay

## 2019-12-09 ENCOUNTER — Other Ambulatory Visit: Payer: Self-pay | Admitting: Legal Medicine

## 2019-12-09 DIAGNOSIS — M5416 Radiculopathy, lumbar region: Secondary | ICD-10-CM | POA: Diagnosis not present

## 2019-12-09 MED ORDER — TRAMADOL HCL 50 MG PO TABS: 50.0000 mg | ORAL_TABLET | Freq: Three times a day (TID) | ORAL | 0 refills | Status: DC | PRN

## 2019-12-09 NOTE — Telephone Encounter (Signed)
Patient called in stating he saw Ortho today but has an mri scheduled. He is asking for pain medication to get him to that appointment, he sttes Ortho nurse told him to call us.

## 2019-12-17 DIAGNOSIS — M48061 Spinal stenosis, lumbar region without neurogenic claudication: Secondary | ICD-10-CM | POA: Diagnosis not present

## 2019-12-17 DIAGNOSIS — M5416 Radiculopathy, lumbar region: Secondary | ICD-10-CM | POA: Diagnosis not present

## 2019-12-17 DIAGNOSIS — M5126 Other intervertebral disc displacement, lumbar region: Secondary | ICD-10-CM | POA: Diagnosis not present

## 2019-12-23 DIAGNOSIS — M5416 Radiculopathy, lumbar region: Secondary | ICD-10-CM | POA: Diagnosis not present

## 2019-12-26 DIAGNOSIS — M48061 Spinal stenosis, lumbar region without neurogenic claudication: Secondary | ICD-10-CM | POA: Diagnosis not present

## 2019-12-26 DIAGNOSIS — M5416 Radiculopathy, lumbar region: Secondary | ICD-10-CM | POA: Diagnosis not present

## 2019-12-31 ENCOUNTER — Encounter: Payer: Self-pay | Admitting: Cardiology

## 2019-12-31 ENCOUNTER — Other Ambulatory Visit: Payer: Self-pay

## 2019-12-31 ENCOUNTER — Ambulatory Visit: Payer: PPO | Admitting: Cardiology

## 2019-12-31 VITALS — BP 190/86 | HR 112 | Ht 70.0 in | Wt 142.0 lb

## 2019-12-31 DIAGNOSIS — E0840 Diabetes mellitus due to underlying condition with diabetic neuropathy, unspecified: Secondary | ICD-10-CM | POA: Diagnosis not present

## 2019-12-31 DIAGNOSIS — F17200 Nicotine dependence, unspecified, uncomplicated: Secondary | ICD-10-CM

## 2019-12-31 DIAGNOSIS — E785 Hyperlipidemia, unspecified: Secondary | ICD-10-CM

## 2019-12-31 DIAGNOSIS — Z950 Presence of cardiac pacemaker: Secondary | ICD-10-CM

## 2019-12-31 DIAGNOSIS — I10 Essential (primary) hypertension: Secondary | ICD-10-CM

## 2019-12-31 MED ORDER — AMLODIPINE BESYLATE 2.5 MG PO TABS
2.5000 mg | ORAL_TABLET | Freq: Every morning | ORAL | 3 refills | Status: DC
Start: 2019-12-31 — End: 2020-08-04

## 2019-12-31 NOTE — Patient Instructions (Addendum)
Medication Instructions:  Your physician has recommended you make the following change in your medication:  1.  START Amlodipine 2.5 mg taking 1 tablet in the a.m  *If you need a refill on your cardiac medications before your next appointment, please call your pharmacy*   Lab Work: None ordered  If you have labs (blood work) drawn today and your tests are completely normal, you will receive your results only by: [name] MyChart Message (if you have MyChart) OR . A paper copy in the mail If you have any lab test that is abnormal or we need to change your treatment, we will call you to review the results.   Testing/Procedures: None ordered   Follow-Up: At [facility], you and your health needs are our priority.  As part of our continuing mission to provide you with exceptional heart care, we have created designated Provider Care Teams.  These Care Teams include your primary Cardiologist (physician) and Advanced Practice Providers (APPs -  Physician Assistants and Nurse Practitioners) who all work together to provide you with the care you need, when you need it.  We recommend signing up for the patient portal called "MyChart".  Sign up information is provided on this After Visit Summary.  MyChart is used to connect with patients for Virtual Visits (Telemedicine).  Patients are able to view lab/test results, encounter notes, upcoming appointments, etc.  Non-urgent messages can be sent to your provider as well.   To learn more about what you can do with MyChart, go to [email or web address].    Your next appointment:   1 month(s)  The format for your next appointment:   In Person  Provider:   Thomasene Ripple, DO   Other Instructions  Keep your appointment with Dr. Elberta Fortis 01/19/20  Get you a blood pressure monitor to track your blood pressure at home

## 2019-12-31 NOTE — Progress Notes (Signed)
Cardiology Office Note:    Date:  12/31/2019   ID:  Andrew Wiggins, Andrew Wiggins 1955/08/29, MRN [id number]  PCP:  Abigail Miyamoto, MD  Cardiologist:  Thomasene Ripple, DO  Electrophysiologist:  None   Referring MD: Abigail Miyamoto,*     Follow-up visit  History of Present Illness:    Andrew Wiggins is a 64 y.o. male with a hx of complete heart block status post pacemaker, hypertension (will also concern for whitecoat hypertension as noted in his chart), COPD.   His last visit it was a visit to reestablish cardiac care.  During that time the patient was hypertensive therefore I increased his lisinopril to 20 mg twice a day.  He is here today for follow-up visit.  He is very happy tells me that he had recently been 0 block and has felt a lot better with his leg and his walking.  Denies any chest pain, shortness of breath.  Past Medical History:  Diagnosis Date  . AV block    history of  . Cardiac pacemaker in situ   . Dermatitis, contact    due to poison ivy  . GERD (gastroesophageal reflux disease)   . Headache(784.0)   . Hypertension   . Peripheral vascular disease (HCC)   . Tobacco use disorder     Past Surgical History:  Procedure Laterality Date  . laproscopic cholecystectomy  2000   by Dr. Odie Sera  . PACEMAKER INSERTION  03/2008   by Dr. Ladona Ridgel    Current Medications: Current Meds  Medication Sig  . aspirin 81 MG tablet Take 81 mg by mouth daily.    . Aspirin-Acetaminophen-Caffeine (GOODY HEADACHE PO) Take 1 packet by mouth daily as needed for headaches  . gabapentin (NEURONTIN) 300 MG capsule Take 300 mg by mouth 3 (three) times daily.  [name] glimepiride (AMARYL) 4 MG tablet Take 1 tablet by mouth once daily  . lisinopril (ZESTRIL) 20 MG tablet Take 1 tablet (20 mg total) by mouth in the morning and at bedtime.  . metFORMIN (GLUCOPHAGE-XR) 500 MG 24 hr tablet TAKE 1 TABLET BY MOUTH ONCE DAILY WITH EVENING MEAL     Allergies:   Patient has no known allergies.    Social History   Socioeconomic History  . Marital status: Legally Separated    Spouse name: Not on file  . Number of children: 2  . Years of education: Not on file  . Highest education level: Not on file  Occupational History  . Occupation: unemployed    Comment: works for himself  Tobacco Use  . Smoking status: Current Every Day Smoker    Packs/day: 0.50    Years: 25.00    Pack years: 12.50    Types: Cigarettes  . Smokeless tobacco: Never Used  Substance and Sexual Activity  . Alcohol use: Not Currently    Alcohol/week: 0.0 standard drinks  . Drug use: No  . Sexual activity: Not Currently  Other Topics Concern  . Not on file  Social History Narrative   2 siblings died with leukemia around age 64   Social Determinants of Health   Financial Resource Strain:   . Difficulty of Paying Living Expenses: Not on file  Food Insecurity:   . Worried About Programme researcher, broadcasting/film/video in the Last Year: Not on file  . Ran Out of Food in the Last Year: Not on file  Transportation Needs:   . Lack of Transportation (Medical): Not on file  . Lack of Transportation (  Non-Medical): Not on file  Physical Activity:   . Days of Exercise per Week: Not on file  . Minutes of Exercise per Session: Not on file  Stress:   . Feeling of Stress : Not on file  Social Connections:   . Frequency of Communication with Friends and Family: Not on file  . Frequency of Social Gatherings with Friends and Family: Not on file  . Attends Religious Services: Not on file  . Active Member of Clubs or Organizations: Not on file  . Attends Banker Meetings: Not on file  . Marital Status: Not on file     Family History: The patient's family history includes Diabetes in his brother.  ROS:   Review of Systems  Constitution: Negative for decreased appetite, fever and weight gain.  HENT: Negative for congestion, ear discharge, hoarse voice and sore throat.   Eyes: Negative for discharge, redness, vision  loss in right eye and visual halos.  Cardiovascular: Negative for chest pain, dyspnea on exertion, leg swelling, orthopnea and palpitations.  Respiratory: Negative for cough, hemoptysis, shortness of breath and snoring.   Endocrine: Negative for heat intolerance and polyphagia.  Hematologic/Lymphatic: Negative for bleeding problem. Does not bruise/bleed easily.  Skin: Negative for flushing, nail changes, rash and suspicious lesions.  Musculoskeletal: Negative for arthritis, joint pain, muscle cramps, myalgias, neck pain and stiffness.  Gastrointestinal: Negative for abdominal pain, bowel incontinence, diarrhea and excessive appetite.  Genitourinary: Negative for decreased libido, genital sores and incomplete emptying.  Neurological: Negative for brief paralysis, focal weakness, headaches and loss of balance.  Psychiatric/Behavioral: Negative for altered mental status, depression and suicidal ideas.  Allergic/Immunologic: Negative for HIV exposure and persistent infections.    EKGs/Labs/Other Studies Reviewed:    The following studies were reviewed today:   EKG:  The ekg ordered today demonstrates   Recent Labs: 11/18/2019: ALT 14; BUN 12; Creatinine, Ser 0.89; Hemoglobin 15.7; Platelets 267; Potassium 4.2; Sodium 142  Recent Lipid Panel    Component Value Date/Time   CHOL 150 11/18/2019 1023   TRIG 108 11/18/2019 1023   HDL 33 (L) 11/18/2019 1023   CHOLHDL 4.5 11/18/2019 1023   CHOLHDL 5 12/20/2017 1320   VLDL 44.0 (H) 12/20/2017 1320   LDLCALC 97 11/18/2019 1023   LDLDIRECT 113.0 12/20/2017 1320    Physical Exam:    VS:  BP (!) 190/86 (BP Location: Left Arm, Patient Position: Sitting, Cuff Size: Normal)   Pulse (!) 112   Ht 5\' 10"  (1.778 m)   Wt 142 lb (64.4 kg)   SpO2 94%   BMI 20.37 kg/m     Wt Readings from Last 3 Encounters:  12/31/19 142 lb (64.4 kg)  11/20/19 148 lb 12.8 oz (67.5 kg)  11/18/19 150 lb (68 kg)     GEN: Well nourished, well developed in no acute  distress HEENT: Normal NECK: No JVD; No carotid bruits LYMPHATICS: No lymphadenopathy CARDIAC: S1S2 noted,RRR, no murmurs, rubs, gallops RESPIRATORY:  Clear to auscultation without rales, wheezing or rhonchi  ABDOMEN: Soft, non-tender, non-distended, +bowel sounds, no guarding. EXTREMITIES: No edema, No cyanosis, no clubbing MUSCULOSKELETAL:  No deformity  SKIN: Warm and dry NEUROLOGIC:  Alert and oriented x 3, non-focal PSYCHIATRIC:  Normal affect, good insight  ASSESSMENT:    1. Essential hypertension   2. Diabetes mellitus due to underlying condition with diabetic neuropathy, unspecified whether long term insulin use (HCC)   3. Dyslipidemia   4. CIGARETTE SMOKER   5. Cardiac pacemaker in situ  PLAN:     Blood pressure is elevated in the office today he is on lisinopril 20 mg twice a day.  Going to add amlodipine 2.5 mg to his regimen.  His target is less than 130/80 mmHg.  Dyslipidemia he is preferring time modification for now.  Profile showed LDL 97, HDL 33 triglyceride 108 and total cholesterol 50.  He has a pending appointment with EP to establish care. Smoking cessation advised.  The patient is in agreement with the above plan. The patient left the office in stable condition.  The patient will follow up in   Medication Adjustments/Labs and Tests Ordered: Current medicines are reviewed at length with the patient today.  Concerns regarding medicines are outlined above.  No orders of the defined types were placed in this encounter.  Meds ordered this encounter  Medications  . amLODipine (NORVASC) 2.5 MG tablet    Sig: Take 1 tablet (2.5 mg total) by mouth in the morning.    Dispense:  90 tablet    Refill:  3    Patient Instructions  Medication Instructions:  Your physician has recommended you make the following change in your medication:  1.  START Amlodipine 2.5 mg taking 1 tablet in the a.m  *If you need a refill on your cardiac medications before your next  appointment, please call your pharmacy*   Lab Work: None ordered  If you have labs (blood work) drawn today and your tests are completely normal, you will receive your results only by: [name]. MyChart Message (if you have MyChart) OR . A paper copy in the mail If you have any lab test that is abnormal or we need to change your treatment, we will call you to review the results.   Testing/Procedures: None ordered   Follow-Up: At [facility], you and your health needs are our priority.  As part of our continuing mission to provide you with exceptional heart care, we have created designated Provider Care Teams.  These Care Teams include your primary Cardiologist (physician) and Advanced Practice Providers (APPs -  Physician Assistants and Nurse Practitioners) who all work together to provide you with the care you need, when you need it.  We recommend signing up for the patient portal called "MyChart".  Sign up information is provided on this After Visit Summary.  MyChart is used to connect with patients for Virtual Visits (Telemedicine).  Patients are able to view lab/test results, encounter notes, upcoming appointments, etc.  Non-urgent messages can be sent to your provider as well.   To learn more about what you can do with MyChart, go to [email or web address].    Your next appointment:   1 month(s)  The format for your next appointment:   In Person  Provider:   Thomasene RippleKardie Levina Boyack, DO   Other Instructions  Keep your appointment with Dr. Elberta Fortisamnitz 01/19/20  Get you a blood pressure monitor to track your blood pressure at home     Adopting a Healthy Lifestyle.  Know what a healthy weight is for you (roughly BMI <25) and aim to maintain this   Aim for 7+ servings of fruits and vegetables daily   65-80+ fluid ounces of water or unsweet tea for healthy kidneys   Limit to max 1 drink of alcohol per day; avoid smoking/tobacco   Limit animal fats in diet for cholesterol and heart health -  choose grass fed whenever available   Avoid highly processed foods, and foods high in saturated/trans fats   Aim for  low stress - take time to unwind and care for your mental health   Aim for 150 min of moderate intensity exercise weekly for heart health, and weights twice weekly for bone health   Aim for 7-9 hours of sleep daily   When it comes to diets, agreement about the perfect plan isnt easy to find, even among the experts. Experts at the [facility] [employer] developed an idea known as the Healthy Eating Plate. Just imagine a plate divided into logical, healthy portions.   The emphasis is on diet quality:   Load up on vegetables and fruits - one-half of your plate: Aim for color and variety, and remember that potatoes dont count.   Go for whole grains - one-quarter of your plate: Whole wheat, barley, wheat berries, quinoa, oats, brown rice, and foods made with them. If you want pasta, go with whole wheat pasta.   Protein power - one-quarter of your plate: Fish, chicken, beans, and nuts are all healthy, versatile protein sources. Limit red meat.   The diet, however, does go beyond the plate, offering a few other suggestions.   Use healthy plant oils, such as olive, canola, soy, corn, sunflower and peanut. Check the labels, and avoid partially hydrogenated oil, which have unhealthy trans fats.   If youre thirsty, drink water. Coffee and tea are good in moderation, but skip sugary drinks and limit milk and dairy products to one or two daily servings.   The type of carbohydrate in the diet is more important than the amount. Some sources of carbohydrates, such as vegetables, fruits, whole grains, and beans-are healthier than others.   Finally, stay active  Signed, Thomasene Ripple, DO  12/31/2019 2:43 PM    [facility]

## 2020-01-19 ENCOUNTER — Encounter: Payer: Self-pay | Admitting: Cardiology

## 2020-01-19 ENCOUNTER — Other Ambulatory Visit: Payer: Self-pay

## 2020-01-19 ENCOUNTER — Ambulatory Visit (INDEPENDENT_AMBULATORY_CARE_PROVIDER_SITE_OTHER): Payer: PPO | Admitting: Cardiology

## 2020-01-19 VITALS — BP 144/62 | HR 111 | Ht 70.0 in | Wt 141.0 lb

## 2020-01-19 DIAGNOSIS — I442 Atrioventricular block, complete: Secondary | ICD-10-CM

## 2020-01-19 NOTE — Progress Notes (Signed)
Electrophysiology Office Note   Date:  01/19/2020   ID:  Andrew Wiggins August 11, 1955, MRN [id number]  PCP:  Abigail Miyamoto, MD  Cardiologist:  [name] Primary Electrophysiologist:  Kaisyn Reinhold Jorja Loa, MD    Chief Complaint: pacemaker   History of Present Illness: Andrew Wiggins is a 64 y.o. male who is being seen today for the evaluation of pacemaker at the request of Abigail Miyamoto,*. Presenting today for electrophysiology evaluation.  He has a history of complete heart block status postMedtronic dual-chamber pacemaker, hypertension, COPD.  He presents to reestablish his pacemaker care.  Today, he denies symptoms of palpitations, chest pain, shortness of breath, orthopnea, PND, lower extremity edema, claudication, dizziness, presyncope, syncope, bleeding, or neurologic sequela. The patient is tolerating medications without difficulties.  He currently feels well other than severe back pain.  He had an injection a few weeks ago but has continued to have pain.  He is following up with his orthopedic doctor in a few weeks.  He feels that he is tachycardic and hypertensive due to pain.   Past Medical History:  Diagnosis Date  . AV block    history of  . Cardiac pacemaker in situ   . Dermatitis, contact    due to poison ivy  . GERD (gastroesophageal reflux disease)   . Headache(784.0)   . Hypertension   . Peripheral vascular disease (HCC)   . Tobacco use disorder    Past Surgical History:  Procedure Laterality Date  . laproscopic cholecystectomy  2000   by Dr. Odie Sera  . PACEMAKER INSERTION  03/2008   by Dr. Ladona Ridgel     Current Outpatient Medications  Medication Sig Dispense Refill  . amLODipine (NORVASC) 2.5 MG tablet Take 1 tablet (2.5 mg total) by mouth in the morning. 90 tablet 3  . aspirin 81 MG tablet Take 81 mg by mouth daily.      . Aspirin-Acetaminophen-Caffeine (GOODY HEADACHE PO) Take 1 packet by mouth daily as needed for headaches    .  gabapentin (NEURONTIN) 300 MG capsule Take 300 mg by mouth 3 (three) times daily.    [name] glimepiride (AMARYL) 4 MG tablet Take 1 tablet by mouth once daily 90 tablet 2  . lisinopril (ZESTRIL) 20 MG tablet Take 1 tablet (20 mg total) by mouth in the morning and at bedtime. 180 tablet 2  . metFORMIN (GLUCOPHAGE-XR) 500 MG 24 hr tablet TAKE 1 TABLET BY MOUTH ONCE DAILY WITH EVENING MEAL 90 tablet 2   No current facility-administered medications for this visit.    Allergies:   Patient has no known allergies.   Social History:  The patient  reports that he has been smoking cigarettes. He has a 12.50 pack-year smoking history. He has never used smokeless tobacco. He reports previous alcohol use. He reports that he does not use drugs.   Family History:  The patient's family history includes Diabetes in his brother.    ROS:  Please see the history of present illness.   Otherwise, review of systems is positive for none.   All other systems are reviewed and negative.    PHYSICAL EXAM: VS:  BP (!) 144/62   Pulse (!) 111   Ht 5\' 10"  (1.778 m)   Wt 141 lb (64 kg)   BMI 20.23 kg/m  , BMI Body mass index is 20.23 kg/m. GEN: Well nourished, well developed, in no acute distress  HEENT: normal  Neck: no JVD, carotid bruits, or masses Cardiac: RRR; no  murmurs, rubs, or gallops,no edema  Respiratory:  clear to auscultation bilaterally, normal work of breathing GI: soft, nontender, nondistended, + BS MS: no deformity or atrophy  Skin: warm and dry, device pocket is well healed Neuro:  Strength and sensation are intact Psych: euthymic mood, full affect  EKG:  EKG is not ordered today. Personal review of the ekg ordered 11/20/19 shows atrial sensed, ventricular paced  Device interrogation is reviewed today in detail.  See PaceArt for details.   Recent Labs: 11/18/2019: ALT 14; BUN 12; Creatinine, Ser 0.89; Hemoglobin 15.7; Platelets 267; Potassium 4.2; Sodium 142    Lipid Panel     Component  Value Date/Time   CHOL 150 11/18/2019 1023   TRIG 108 11/18/2019 1023   HDL 33 (L) 11/18/2019 1023   CHOLHDL 4.5 11/18/2019 1023   CHOLHDL 5 12/20/2017 1320   VLDL 44.0 (H) 12/20/2017 1320   LDLCALC 97 11/18/2019 1023   LDLDIRECT 113.0 12/20/2017 1320     Wt Readings from Last 3 Encounters:  01/19/20 141 lb (64 kg)  12/31/19 142 lb (64.4 kg)  11/20/19 148 lb 12.8 oz (67.5 kg)      Other studies Reviewed: Additional studies/ records that were reviewed today include: epic notes   ASSESSMENT AND PLAN:  1.  Complete heart block: Status post Medtronic dual-chamber pacemaker.  Device functioning appropriately.  No changes at this time.  2.  Hypertension: Mildly elevated today.  He is having quite a bit of pain.  This may improve once he sees his orthopedic.  No changes.  3.  Hyperlipidemia: Plan per primary cardiology    Current medicines are reviewed at length with the patient today.   The patient does not have concerns regarding his medicines.  The following changes were made today:  none  Labs/ tests ordered today include:  No orders of the defined types were placed in this encounter.    Disposition:   FU with Gilmer Kaminsky 1 year  Signed, Lexie Morini Jorja Loa, MD  01/19/2020 3:22 PM     [facility] [address] [place] [address] [phone] (office) [phone] (fax)

## 2020-01-19 NOTE — Patient Instructions (Signed)
Medication Instructions:  °Your physician recommends that you continue on your current medications as directed. Please refer to the Current Medication list given to you today. ° °*If you need a refill on your cardiac medications before your next appointment, please call your pharmacy* ° ° °Lab Work: °None ordered °If you have labs (blood work) drawn today and your tests are completely normal, you will receive your results only by: °• MyChart Message (if you have MyChart) OR °• A paper copy in the mail °If you have any lab test that is abnormal or we need to change your treatment, we will call you to review the results. ° ° °Testing/Procedures: °None ordered ° ° °Follow-Up: °Remote monitoring is used to monitor your Pacemaker of ICD from home. This monitoring reduces the number of office visits required to check your device to one time per year. It allows us to keep an eye on the functioning of your device to ensure it is working properly. You are scheduled for a device check from home on 04/19/2020. You may send your transmission at any time that day. If you have a wireless device, the transmission will be sent automatically. After your physician reviews your transmission, you will receive a postcard with your next transmission date. ° °At [facility], you and your health needs are our priority.  As part of our continuing mission to provide you with exceptional heart care, we have created designated Provider Care Teams.  These Care Teams include your primary Cardiologist (physician) and Advanced Practice Providers (APPs -  Physician Assistants and Nurse Practitioners) who all work together to provide you with the care you need, when you need it. ° °We recommend signing up for the patient portal called "MyChart".  Sign up information is provided on this After Visit Summary.  MyChart is used to connect with patients for Virtual Visits (Telemedicine).  Patients are able to view lab/test results, encounter notes,  upcoming appointments, etc.  Non-urgent messages can be sent to your provider as well.   °To learn more about what you can do with MyChart, go to [email or web address].   ° °Your next appointment:   °1 year(s) ° °The format for your next appointment:   °In Person ° °Provider:   °Will Camnitz, MD ° ° °Thank you for choosing [facility]!! ° ° °Elveria Lauderbaugh, RN °[phone] ° ° ° °Other Instructions ° °Device clinic  [phone] ° ° ° ° °

## 2020-01-29 ENCOUNTER — Telehealth: Payer: Self-pay | Admitting: Emergency Medicine

## 2020-01-29 NOTE — Telephone Encounter (Signed)
Email sent to [employer] requesting a new monitor sent to patient along with return kit. Patient called and updated.

## 2020-01-29 NOTE — Telephone Encounter (Signed)
LMOM. Need to confirm address to send new medtronic monitor.

## 2020-01-29 NOTE — Telephone Encounter (Signed)
Patient returned call and the address on file is correct

## 2020-01-30 NOTE — Telephone Encounter (Signed)
Email received this morning starting the patient will receive a monitor within 7-10 business days.

## 2020-02-03 ENCOUNTER — Ambulatory Visit: Payer: PPO | Admitting: Cardiology

## 2020-02-03 DIAGNOSIS — L259 Unspecified contact dermatitis, unspecified cause: Secondary | ICD-10-CM | POA: Insufficient documentation

## 2020-02-03 DIAGNOSIS — I442 Atrioventricular block, complete: Secondary | ICD-10-CM | POA: Insufficient documentation

## 2020-02-03 DIAGNOSIS — M5416 Radiculopathy, lumbar region: Secondary | ICD-10-CM | POA: Diagnosis not present

## 2020-02-03 DIAGNOSIS — I739 Peripheral vascular disease, unspecified: Secondary | ICD-10-CM | POA: Insufficient documentation

## 2020-02-03 DIAGNOSIS — K219 Gastro-esophageal reflux disease without esophagitis: Secondary | ICD-10-CM | POA: Insufficient documentation

## 2020-02-04 ENCOUNTER — Ambulatory Visit (INDEPENDENT_AMBULATORY_CARE_PROVIDER_SITE_OTHER): Payer: PPO | Admitting: Cardiology

## 2020-02-04 ENCOUNTER — Other Ambulatory Visit: Payer: Self-pay

## 2020-02-04 ENCOUNTER — Encounter: Payer: Self-pay | Admitting: Cardiology

## 2020-02-04 VITALS — BP 147/64 | HR 106 | Ht 69.0 in | Wt 144.0 lb

## 2020-02-04 DIAGNOSIS — Z950 Presence of cardiac pacemaker: Secondary | ICD-10-CM

## 2020-02-04 DIAGNOSIS — I10 Essential (primary) hypertension: Secondary | ICD-10-CM

## 2020-02-04 DIAGNOSIS — Z720 Tobacco use: Secondary | ICD-10-CM

## 2020-02-04 DIAGNOSIS — E119 Type 2 diabetes mellitus without complications: Secondary | ICD-10-CM

## 2020-02-04 DIAGNOSIS — E782 Mixed hyperlipidemia: Secondary | ICD-10-CM

## 2020-02-04 DIAGNOSIS — E1169 Type 2 diabetes mellitus with other specified complication: Secondary | ICD-10-CM

## 2020-02-04 HISTORY — DX: Tobacco use: Z72.0

## 2020-02-04 HISTORY — DX: Type 2 diabetes mellitus without complications: E11.9

## 2020-02-04 NOTE — Patient Instructions (Signed)

## 2020-02-04 NOTE — Progress Notes (Signed)
Cardiology Office Note:    Date:  02/04/2020   ID:  Andrew Wiggins, DOB 02-22-1956, MRN [id number]  PCP:  Andrew Wiggins, Andrew Edward, MD  Cardiologist:  Andrew RippleKardie Lehua Flores, DO  Electrophysiologist:  Andrew LemmingWill Martin Camnitz, MD   Referring MD: Andrew Wiggins, Andrew Wiggins,*   "  History of Present Illness:    Andrew Wiggins is a 64 y.o. male with a hx of complete heart block status post pacemaker, hypertension (will also concern for whitecoat hypertension as noted in his chart), COPD.  His last office visit I added amlodipine 2.5 mg to his lisinopril dose of 20 mg twice daily.  He is here today for follow-up visit.  In the interim he did see EP to establish care.  He is here today for follow-up visit.  He is in significant pain due to his back.  He tells me that he had epidural shot recently which helped and he has been following with orthopedic surgery.  He does have another scheduled shot.  If this does not work or go to proceed with surgery.  Past Medical History:  Diagnosis Date  . Allergic rhinitis 09/04/2012  . AV block    history of  . Cardiac conduction disorder 11/26/2008    Hx of AV BLOCK (ICD-426.9) & CARDIAC PACEMAKER IN SITU (ICD-V45.01) - he was adm 1/10 w/ symptomatic bradycardias & 2:1 heart block requiring pacemaker- followed by Andrew Wiggins... ~  Jan11:  Last seen by Cards & pacer reprogrammed...    . Cardiac pacemaker in situ   . Carotid artery disease (HCC) 07/27/2010   R/O PERIPHERAL VASCULAR DISEASE (ICD-443.9) - on ASA 81mg /d... exam 11/09 shows gr 1-2 sys murmur LSB, but also has prominent bruit to base of right side of his neck w/ right > left Carotid Bruit... ~  CDopplers 11/09 showed severe plaque right ICA, & min plaque on left... no signif ICA stenoses per report. ~  f/u CDopplers 12/10 showed mild irreg plaque bilat in bulbs & intimal thickening in righ  . COPD (chronic obstructive pulmonary disease) with chronic bronchitis (HCC) 03/26/2008   CHRONIC OBSTRUCTIVE PULMONARY DISEASE  (ICD-496) - hx recurrent bronchitic episodes in the past... smokes 1/2 - 1ppd x yrs... ~  PFT's 11/08 showed FVC= 3.85 (79%), FEV1= 2.19 (55%), %1sec= 57, mid-flows= 32% predicted:  all c/w mod airflow obstruction...  ~  CXR 12/10 showed left subclav pacer, mild hyperinflation, NAD...    . Dermatitis, contact    due to poison ivy  . Diabetes mellitus with neuropathy (HCC) 12/20/2016  . Dyslipidemia 03/06/2012  . Essential hypertension 04/10/2007    HYPERTENSION (ICD-401.9) - prev on Micardis 80mg /d then switched to Losartan 100mg /d, but he lost his job in 2011& couldn't afford Rx;  He called us 2/12 w/ this news & we phoned in Lisinopril 20mg /d... ~  2DEcho 11/09 showed sl incr LVwall thickness, norm LVF w/ EF= 55-60%, no regional wall motion abn, mild DD, mild MR... ~  Myoview 12/09 showed LBBB, no perfusion defects, abn septal motion & EF  . GERD 03/26/2008    GERD (ICD-530.81) - prev mild GERD symptoms that improved after cholecystectomy on 2000... uses OTC PPI as needed... ~  he is due for routine screening colonoscopy...      . GERD (gastroesophageal reflux disease)   . Headache(784.0)   . Hypertension   . Mixed hyperlipidemia 06/19/2019  . Peripheral vascular disease (HCC)   . Sciatica 11/18/2019  . Tobacco use disorder     Past Surgical History:  Procedure Laterality Date  . laproscopic cholecystectomy  2000   by Dr. Odie Wiggins  . PACEMAKER INSERTION  03/2008   by Dr. Ladona Wiggins    Current Medications: Current Meds  Medication Sig  . amLODipine (NORVASC) 2.5 MG tablet Take 1 tablet (2.5 mg total) by mouth in the morning.  [name] aspirin 81 MG tablet Take 81 mg by mouth daily.    . Aspirin-Acetaminophen-Caffeine (GOODY HEADACHE PO) Take 1 packet by mouth daily as needed for headaches  . gabapentin (NEURONTIN) 300 MG capsule Take 300 mg by mouth 3 (three) times daily.  [name] glimepiride (AMARYL) 4 MG tablet Take 1 tablet by mouth once daily  . lisinopril (ZESTRIL) 20 MG tablet Take 1 tablet (20  mg total) by mouth in the morning and at bedtime.  . metFORMIN (GLUCOPHAGE-XR) 500 MG 24 hr tablet TAKE 1 TABLET BY MOUTH ONCE DAILY WITH EVENING MEAL     Allergies:   Patient has no known allergies.   Social History   Socioeconomic History  . Marital status: Legally Separated    Spouse name: Not on file  . Number of children: 2  . Years of education: Not on file  . Highest education level: Not on file  Occupational History  . Occupation: unemployed    Comment: works for himself  Tobacco Use  . Smoking status: Current Every Day Smoker    Packs/day: 0.50    Years: 25.00    Pack years: 12.50    Types: Cigarettes  . Smokeless tobacco: Never Used  Substance and Sexual Activity  . Alcohol use: Not Currently    Alcohol/week: 0.0 standard drinks  . Drug use: No  . Sexual activity: Not Currently  Other Topics Concern  . Not on file  Social History Narrative   2 siblings died with leukemia around age 9   Social Determinants of Health   Financial Resource Strain:   . Difficulty of Paying Living Expenses: Not on file  Food Insecurity:   . Worried About Programme researcher, broadcasting/film/video in the Last Year: Not on file  . Ran Out of Food in the Last Year: Not on file  Transportation Needs:   . Lack of Transportation (Medical): Not on file  . Lack of Transportation (Non-Medical): Not on file  Physical Activity:   . Days of Exercise per Week: Not on file  . Minutes of Exercise per Session: Not on file  Stress:   . Feeling of Stress : Not on file  Social Connections:   . Frequency of Communication with Friends and Family: Not on file  . Frequency of Social Gatherings with Friends and Family: Not on file  . Attends Religious Services: Not on file  . Active Member of Clubs or Organizations: Not on file  . Attends Banker Meetings: Not on file  . Marital Status: Not on file     Family History: The patient's family history includes Diabetes in his brother.  ROS:   Review of  Systems  Constitution: Negative for decreased appetite, fever and weight gain.  HENT: Negative for congestion, ear discharge, hoarse voice and sore throat.   Eyes: Negative for discharge, redness, vision loss in right eye and visual halos.  Cardiovascular: Negative for chest pain, dyspnea on exertion, leg swelling, orthopnea and palpitations.  Respiratory: Negative for cough, hemoptysis, shortness of breath and snoring.   Endocrine: Negative for heat intolerance and polyphagia.  Hematologic/Lymphatic: Negative for bleeding problem. Does not bruise/bleed easily.  Skin: Negative for flushing,  nail changes, rash and suspicious lesions.  Musculoskeletal: Negative for arthritis, joint pain, muscle cramps, myalgias, neck pain and stiffness.  Gastrointestinal: Negative for abdominal pain, bowel incontinence, diarrhea and excessive appetite.  Genitourinary: Negative for decreased libido, genital sores and incomplete emptying.  Neurological: Negative for brief paralysis, focal weakness, headaches and loss of balance.  Psychiatric/Behavioral: Negative for altered mental status, depression and suicidal ideas.  Allergic/Immunologic: Negative for HIV exposure and persistent infections.    EKGs/Labs/Other Studies Reviewed:    The following studies were reviewed today:   EKG: None today   Recent Labs: 11/18/2019: ALT 14; BUN 12; Creatinine, Ser 0.89; Hemoglobin 15.7; Platelets 267; Potassium 4.2; Sodium 142  Recent Lipid Panel    Component Value Date/Time   CHOL 150 11/18/2019 1023   TRIG 108 11/18/2019 1023   HDL 33 (L) 11/18/2019 1023   CHOLHDL 4.5 11/18/2019 1023   CHOLHDL 5 12/20/2017 1320   VLDL 44.0 (H) 12/20/2017 1320   LDLCALC 97 11/18/2019 1023   LDLDIRECT 113.0 12/20/2017 1320    Physical Exam:    VS:  BP (!) 147/64   Pulse (!) 106   Ht  (1.753 m)   Wt 144 lb (65.3 kg)   SpO2 96%   BMI 21.27 kg/m     Wt Readings from Last 3 Encounters:  02/04/20 144 lb (65.3 kg)    01/19/20 141 lb (64 kg)  12/31/19 142 lb (64.4 kg)     GEN: Well nourished, well developed in no acute distress HEENT: Normal NECK: No JVD; No carotid bruits LYMPHATICS: No lymphadenopathy CARDIAC: S1S2 noted,RRR, no murmurs, rubs, gallops RESPIRATORY:  Clear to auscultation without rales, wheezing or rhonchi  ABDOMEN: Soft, non-tender, non-distended, +bowel sounds, no guarding. EXTREMITIES: No edema, No cyanosis, no clubbing MUSCULOSKELETAL:  No deformity  SKIN: Warm and dry NEUROLOGIC:  Alert and oriented x 3, non-focal PSYCHIATRIC:  Normal affect, good insight  ASSESSMENT:    1. Essential hypertension   2. Cardiac pacemaker in situ   3. Mixed hyperlipidemia   4. Type 2 diabetes mellitus with other specified complication, without long-term current use of insulin (HCC)   5. Tobacco use    PLAN:     His blood pressure is not below target but he states significant pain which she tells me he has not had any pain medication for I think this is may be affecting his blood pressure as well.  We will keep him on his lisinopril 20 mg twice daily as well as his amlodipine 2.5 mg daily.  We talked about the potential plan that if his epidural does not work he will need surgery.  If he and his surgeon decides to do surgery.  This patient will need to have stress test.  At which time he will be best served with a pharmacologic stress test.  I explained to the patient that he does have METs less than 4 given his severe back pain he is not active as well as his risk factors which include diabetes and hyperlipidemia hypertension he is a smoker the most reasonable approach with his cardiac clearance before surgery will be to undergo a stress test.  He will keep my office informed if they choose to proceed with any procedures.  Hyperlipidemia he prefers diet modification and has declined statin.  Smoking cessation advised  The patient is in agreement with the above plan. The patient left the  office in stable condition.  The patient will follow up in   Medication Adjustments/Labs  and Tests Ordered: Current medicines are reviewed at length with the patient today.  Concerns regarding medicines are outlined above.  No orders of the defined types were placed in this encounter.  No orders of the defined types were placed in this encounter.   Patient Instructions  Medication Instructions:  No medication changes. *If you need a refill on your cardiac medications before your next appointment, please call your pharmacy*   Lab Work: None ordered If you have labs (blood work) drawn today and your tests are completely normal, you will receive your results only by: [name] MyChart Message (if you have MyChart) OR . A paper copy in the mail If you have any lab test that is abnormal or we need to change your treatment, we will call you to review the results.   Testing/Procedures: None ordered   Follow-Up: At [facility], you and your health needs are our priority.  As part of our continuing mission to provide you with exceptional heart care, we have created designated Provider Care Teams.  These Care Teams include your primary Cardiologist (physician) and Advanced Practice Providers (APPs -  Physician Assistants and Nurse Practitioners) who all work together to provide you with the care you need, when you need it.  We recommend signing up for the patient portal called "MyChart".  Sign up information is provided on this After Visit Summary.  MyChart is used to connect with patients for Virtual Visits (Telemedicine).  Patients are able to view lab/test results, encounter notes, upcoming appointments, etc.  Non-urgent messages can be sent to your provider as well.   To learn more about what you can do with MyChart, go to [email or web address].    Your next appointment:   6 month(s)  The format for your next appointment:   In Person  Provider:   Thomasene Ripple, DO   Other  Instructions NA     Adopting a Healthy Lifestyle.  Know what a healthy weight is for you (roughly BMI <25) and aim to maintain this   Aim for 7+ servings of fruits and vegetables daily   65-80+ fluid ounces of water or unsweet tea for healthy kidneys   Limit to max 1 drink of alcohol per day; avoid smoking/tobacco   Limit animal fats in diet for cholesterol and heart health - choose grass fed whenever available   Avoid highly processed foods, and foods high in saturated/trans fats   Aim for low stress - take time to unwind and care for your mental health   Aim for 150 min of moderate intensity exercise weekly for heart health, and weights twice weekly for bone health   Aim for 7-9 hours of sleep daily   When it comes to diets, agreement about the perfect plan isnt easy to find, even among the experts. Experts at the [facility] [employer] developed an idea known as the Healthy Eating Plate. Just imagine a plate divided into logical, healthy portions.   The emphasis is on diet quality:   Load up on vegetables and fruits - one-half of your plate: Aim for color and variety, and remember that potatoes dont count.   Go for whole grains - one-quarter of your plate: Whole wheat, barley, wheat berries, quinoa, oats, brown rice, and foods made with them. If you want pasta, go with whole wheat pasta.   Protein power - one-quarter of your plate: Fish, chicken, beans, and nuts are all healthy, versatile protein sources. Limit red meat.   The diet,  however, does go beyond the plate, offering a few other suggestions.   Use healthy plant oils, such as olive, canola, soy, corn, sunflower and peanut. Check the labels, and avoid partially hydrogenated oil, which have unhealthy trans fats.   If youre thirsty, drink water. Coffee and tea are good in moderation, but skip sugary drinks and limit milk and dairy products to one or two daily servings.   The type of carbohydrate in the  diet is more important than the amount. Some sources of carbohydrates, such as vegetables, fruits, whole grains, and beans-are healthier than others.   Finally, stay active  Signed, Andrew Ripple, DO  02/04/2020 1:38 PM    [facility]

## 2020-02-07 DIAGNOSIS — U071 COVID-19: Secondary | ICD-10-CM | POA: Diagnosis not present

## 2020-02-07 DIAGNOSIS — J1282 Pneumonia due to coronavirus disease 2019: Secondary | ICD-10-CM | POA: Diagnosis not present

## 2020-02-07 DIAGNOSIS — R1110 Vomiting, unspecified: Secondary | ICD-10-CM | POA: Diagnosis not present

## 2020-02-07 DIAGNOSIS — R110 Nausea: Secondary | ICD-10-CM | POA: Diagnosis not present

## 2020-02-07 DIAGNOSIS — J189 Pneumonia, unspecified organism: Secondary | ICD-10-CM | POA: Diagnosis not present

## 2020-02-07 DIAGNOSIS — F17210 Nicotine dependence, cigarettes, uncomplicated: Secondary | ICD-10-CM | POA: Diagnosis not present

## 2020-02-07 DIAGNOSIS — R112 Nausea with vomiting, unspecified: Secondary | ICD-10-CM | POA: Diagnosis not present

## 2020-02-11 NOTE — Telephone Encounter (Signed)
Following up to see if remote box has arrived.  No answer, LMOVM.

## 2020-02-17 NOTE — Telephone Encounter (Signed)
LMOVM for pt to call the device clinic about his monitor.

## 2020-02-20 NOTE — Telephone Encounter (Signed)
Following up with patient about remote monitor box.   No answer, LMOVM.

## 2020-02-27 DIAGNOSIS — M48062 Spinal stenosis, lumbar region with neurogenic claudication: Secondary | ICD-10-CM | POA: Diagnosis not present

## 2020-02-27 DIAGNOSIS — M5416 Radiculopathy, lumbar region: Secondary | ICD-10-CM | POA: Diagnosis not present

## 2020-02-27 DIAGNOSIS — M48061 Spinal stenosis, lumbar region without neurogenic claudication: Secondary | ICD-10-CM | POA: Diagnosis not present

## 2020-03-03 NOTE — Telephone Encounter (Signed)
Patient has received new monitor. Reports he has not set it up because he has been sick. Education done on ability of monitor to assist in notifying clinic of issues that may occur with his heart rhythm when he is sick. He will call the device clinic when he returns home to get assistance with setting up his new monitor.

## 2020-03-08 NOTE — Telephone Encounter (Signed)
LMOVM to follow up to get a transmission.

## 2020-03-09 ENCOUNTER — Ambulatory Visit (INDEPENDENT_AMBULATORY_CARE_PROVIDER_SITE_OTHER): Payer: PPO

## 2020-03-09 ENCOUNTER — Telehealth: Payer: Self-pay

## 2020-03-09 DIAGNOSIS — I4430 Unspecified atrioventricular block: Secondary | ICD-10-CM

## 2020-03-09 NOTE — Telephone Encounter (Signed)
Patient has sent the transmission successfully

## 2020-03-11 LAB — CUP PACEART REMOTE DEVICE CHECK
Battery Impedance: 2954 Ohm
Battery Remaining Longevity: 27 mo
Battery Voltage: 2.72 V
Brady Statistic AP VP Percent: 1 %
Brady Statistic AP VS Percent: 0 %
Brady Statistic AS VP Percent: 99 %
Brady Statistic AS VS Percent: 0 %
Date Time Interrogation Session: 20211102140333
Implantable Lead Implant Date: 20091120
Implantable Lead Implant Date: 20091120
Implantable Lead Location: 753859
Implantable Lead Location: 753860
Implantable Lead Model: 5076
Implantable Lead Model: 5076
Implantable Pulse Generator Implant Date: 20091120
Lead Channel Impedance Value: 483 Ohm
Lead Channel Impedance Value: 954 Ohm
Lead Channel Pacing Threshold Amplitude: 0.5 V
Lead Channel Pacing Threshold Amplitude: 0.625 V
Lead Channel Pacing Threshold Pulse Width: 0.4 ms
Lead Channel Pacing Threshold Pulse Width: 0.4 ms
Lead Channel Setting Pacing Amplitude: 1.5 V
Lead Channel Setting Pacing Amplitude: 2 V
Lead Channel Setting Pacing Pulse Width: 0.4 ms
Lead Channel Setting Sensing Sensitivity: 5.6 mV

## 2020-03-11 NOTE — Progress Notes (Signed)
Remote pacemaker transmission.   

## 2020-04-13 DIAGNOSIS — M5416 Radiculopathy, lumbar region: Secondary | ICD-10-CM | POA: Diagnosis not present

## 2020-05-10 ENCOUNTER — Telehealth: Payer: Self-pay | Admitting: Legal Medicine

## 2020-05-10 NOTE — Progress Notes (Signed)
  Chronic Care Management   Outreach Note  05/10/2020 Name: IASIAH OZMENT MRN: [id number] DOB: 01-22-1956  Referred by: Abigail Miyamoto, MD Reason for referral : Chronic Care Management   An unsuccessful telephone outreach was attempted today. The patient was referred to the pharmacist for assistance with care management and care coordination.   Follow Up Plan:   Aggie Hacker  Upstream Scheduler

## 2020-05-18 ENCOUNTER — Telehealth: Payer: Self-pay | Admitting: Legal Medicine

## 2020-05-18 NOTE — Progress Notes (Signed)
°  Chronic Care Management   Outreach Note  05/18/2020 Name: TAGGART PRASAD MRN: [id number] DOB: 19-Feb-1956  Referred by: Abigail Miyamoto, MD Reason for referral : Chronic Care Management   A second unsuccessful telephone outreach was attempted today. The patient was referred to pharmacist for assistance with care management and care coordination.  Follow Up Plan:   Aggie Hacker  Upstream Scheduler

## 2020-05-26 DIAGNOSIS — M5137 Other intervertebral disc degeneration, lumbosacral region: Secondary | ICD-10-CM | POA: Diagnosis not present

## 2020-05-26 DIAGNOSIS — Z1389 Encounter for screening for other disorder: Secondary | ICD-10-CM | POA: Diagnosis not present

## 2020-05-26 DIAGNOSIS — M549 Dorsalgia, unspecified: Secondary | ICD-10-CM | POA: Diagnosis not present

## 2020-05-26 DIAGNOSIS — G894 Chronic pain syndrome: Secondary | ICD-10-CM | POA: Diagnosis not present

## 2020-05-26 DIAGNOSIS — M48061 Spinal stenosis, lumbar region without neurogenic claudication: Secondary | ICD-10-CM | POA: Diagnosis not present

## 2020-05-26 DIAGNOSIS — M5136 Other intervertebral disc degeneration, lumbar region: Secondary | ICD-10-CM | POA: Diagnosis not present

## 2020-05-26 DIAGNOSIS — M47816 Spondylosis without myelopathy or radiculopathy, lumbar region: Secondary | ICD-10-CM | POA: Diagnosis not present

## 2020-06-03 ENCOUNTER — Telehealth: Payer: Self-pay | Admitting: Legal Medicine

## 2020-06-03 NOTE — Chronic Care Management (AMB) (Signed)
  Chronic Care Management   Note  06/03/2020 Name: Andrew Wiggins MRN: [id number] DOB: Apr 19, 1956  Andrew Wiggins is a 65 y.o. year old male who is a primary care patient of Abigail Miyamoto, MD. I reached out to Enos Fling by phone today in response to a referral sent by Andrew Wiggins's PCP, Abigail Miyamoto, MD.   Andrew Wiggins was given information about Chronic Care Management services today including:  1. CCM service includes personalized support from designated clinical staff supervised by his physician, including individualized plan of care and coordination with other care providers 2. 24/7 contact phone numbers for assistance for urgent and routine care needs. 3. Service will only be billed when office clinical staff spend 20 minutes or more in a month to coordinate care. 4. Only one practitioner may furnish and bill the service in a calendar month. 5. The patient may stop CCM services at any time (effective at the end of the month) by phone call to the office staff.   Patient agreed to services and verbal consent obtained.   Follow up plan:   Aggie Hacker  Upstream Scheduler

## 2020-06-08 ENCOUNTER — Ambulatory Visit (INDEPENDENT_AMBULATORY_CARE_PROVIDER_SITE_OTHER): Payer: PPO

## 2020-06-08 DIAGNOSIS — I442 Atrioventricular block, complete: Secondary | ICD-10-CM | POA: Diagnosis not present

## 2020-06-09 DIAGNOSIS — Z1389 Encounter for screening for other disorder: Secondary | ICD-10-CM | POA: Diagnosis not present

## 2020-06-09 DIAGNOSIS — M5136 Other intervertebral disc degeneration, lumbar region: Secondary | ICD-10-CM | POA: Diagnosis not present

## 2020-06-09 DIAGNOSIS — G894 Chronic pain syndrome: Secondary | ICD-10-CM | POA: Diagnosis not present

## 2020-06-09 DIAGNOSIS — M47816 Spondylosis without myelopathy or radiculopathy, lumbar region: Secondary | ICD-10-CM | POA: Diagnosis not present

## 2020-06-09 DIAGNOSIS — M549 Dorsalgia, unspecified: Secondary | ICD-10-CM | POA: Diagnosis not present

## 2020-06-09 DIAGNOSIS — M5137 Other intervertebral disc degeneration, lumbosacral region: Secondary | ICD-10-CM | POA: Diagnosis not present

## 2020-06-09 DIAGNOSIS — M48061 Spinal stenosis, lumbar region without neurogenic claudication: Secondary | ICD-10-CM | POA: Diagnosis not present

## 2020-06-11 ENCOUNTER — Telehealth: Payer: Self-pay | Admitting: Cardiology

## 2020-06-11 LAB — CUP PACEART REMOTE DEVICE CHECK
Battery Impedance: 3161 Ohm
Battery Remaining Longevity: 25 mo
Battery Voltage: 2.72 V
Brady Statistic AP VP Percent: 1 %
Brady Statistic AP VS Percent: 0 %
Brady Statistic AS VP Percent: 99 %
Brady Statistic AS VS Percent: 0 %
Date Time Interrogation Session: 20220204085611
Implantable Lead Implant Date: 20091120
Implantable Lead Implant Date: 20091120
Implantable Lead Location: 753859
Implantable Lead Location: 753860
Implantable Lead Model: 5076
Implantable Lead Model: 5076
Implantable Pulse Generator Implant Date: 20091120
Lead Channel Impedance Value: 481 Ohm
Lead Channel Impedance Value: 928 Ohm
Lead Channel Pacing Threshold Amplitude: 0.625 V
Lead Channel Pacing Threshold Amplitude: 0.75 V
Lead Channel Pacing Threshold Pulse Width: 0.4 ms
Lead Channel Pacing Threshold Pulse Width: 0.4 ms
Lead Channel Setting Pacing Amplitude: 1.5 V
Lead Channel Setting Pacing Amplitude: 2 V
Lead Channel Setting Pacing Pulse Width: 0.4 ms
Lead Channel Setting Sensing Sensitivity: 5.6 mV

## 2020-06-11 NOTE — Telephone Encounter (Signed)
  1. Has your device fired? no  2. Is you device beeping? no  3. Are you experiencing draining or swelling at device site? no  4. Are you calling to see if we received your device transmission? Yes  5. Have you passed out? no    Please route to Device Clinic Pool

## 2020-06-11 NOTE — Telephone Encounter (Signed)
Verified with patient transmission received.

## 2020-06-14 DIAGNOSIS — M5136 Other intervertebral disc degeneration, lumbar region: Secondary | ICD-10-CM | POA: Diagnosis not present

## 2020-06-14 DIAGNOSIS — M79605 Pain in left leg: Secondary | ICD-10-CM | POA: Diagnosis not present

## 2020-06-14 DIAGNOSIS — M48061 Spinal stenosis, lumbar region without neurogenic claudication: Secondary | ICD-10-CM | POA: Diagnosis not present

## 2020-06-14 DIAGNOSIS — M2560 Stiffness of unspecified joint, not elsewhere classified: Secondary | ICD-10-CM | POA: Diagnosis not present

## 2020-06-14 DIAGNOSIS — M545 Low back pain, unspecified: Secondary | ICD-10-CM | POA: Diagnosis not present

## 2020-06-16 ENCOUNTER — Encounter: Payer: Self-pay | Admitting: Legal Medicine

## 2020-06-16 ENCOUNTER — Other Ambulatory Visit: Payer: Self-pay

## 2020-06-16 ENCOUNTER — Ambulatory Visit (INDEPENDENT_AMBULATORY_CARE_PROVIDER_SITE_OTHER): Payer: PPO | Admitting: Legal Medicine

## 2020-06-16 DIAGNOSIS — I442 Atrioventricular block, complete: Secondary | ICD-10-CM

## 2020-06-16 DIAGNOSIS — I779 Disorder of arteries and arterioles, unspecified: Secondary | ICD-10-CM | POA: Diagnosis not present

## 2020-06-16 DIAGNOSIS — J449 Chronic obstructive pulmonary disease, unspecified: Secondary | ICD-10-CM | POA: Diagnosis not present

## 2020-06-16 DIAGNOSIS — Z0000 Encounter for general adult medical examination without abnormal findings: Secondary | ICD-10-CM | POA: Diagnosis not present

## 2020-06-16 NOTE — Progress Notes (Signed)
Subjective:  Patient ID: Andrew Wiggins, male    DOB: 1955/09/03  Age: 65 y.o. MRN: [id number]  Chief Complaint  Patient presents with  . Annual Exam    AWV    HPI Encounter for general adult medical examination without abnormal findings  Physical ("At Risk" items are starred): Patient's last physical exam was 1 year ago .   Growth percentile SmartLinks can only be used for patients less than 6 years old.   SDOH Screenings   Alcohol Screen: Low Risk   . Last Alcohol Screening Score (AUDIT): 0  Depression (PHQ2-9): Low Risk   . PHQ-2 Score: 0  Financial Resource Strain: Low Risk   . Difficulty of Paying Living Expenses: Not hard at all  Food Insecurity: No Food Insecurity  . Worried About Programme researcher, broadcasting/film/video in the Last Year: Never true  . Ran Out of Food in the Last Year: Never true  Housing: Low Risk   . Last Housing Risk Score: 0  Physical Activity: Inactive  . Days of Exercise per Week: 0 days  . Minutes of Exercise per Session: 30 min  Social Connections: Socially Isolated  . Frequency of Communication with Friends and Family: Three times a week  . Frequency of Social Gatherings with Friends and Family: Never  . Attends Religious Services: Never  . Active Member of Clubs or Organizations: No  . Attends Banker Meetings: Never  . Marital Status: Divorced  Stress: Stress Concern Present  . Feeling of Stress : To some extent  Tobacco Use: High Risk  . Smoking Tobacco Use: Current Every Day Smoker  . Smokeless Tobacco Use: Never Used  Transportation Needs: No Transportation Needs  . Lack of Transportation (Medical): No  . Lack of Transportation (Non-Medical): No    Fall Risk  06/16/2020 11/18/2019  Falls in the past year? 0 0  Number falls in past yr: 0 0  Injury with Fall? 0 0  Follow up - Falls evaluation completed    Depression screen [facility] 2/9 06/16/2020 11/18/2019 10/24/2013 09/04/2012  Decreased Interest 0 0 0 0  Down, Depressed, Hopeless 0 0 0 0  PHQ  - 2 Score 0 0 0 0    Functional Status Survey: Is the patient deaf or have difficulty hearing?: No Does the patient have difficulty seeing, even when wearing glasses/contacts?: No Does the patient have difficulty concentrating, remembering, or making decisions?: No Does the patient have difficulty walking or climbing stairs?: Yes (Back pain) Does the patient have difficulty dressing or bathing?: No Does the patient have difficulty doing errands alone such as visiting a doctor's office or shopping?: No   Safety: reviewed ;  Patient wears a seat belt. Patient's home has smoke detectors and carbon monoxide detectors. Patient practices appropriate gun safety Patient wears sunscreen with extended sun exposure. Dental Care: biannual cleanings, brushes and flosses daily. Ophthalmology/Optometry: Annual visit.  Hearing loss: none Vision impairments: none Patient is not afflicted from Stress Incontinence and Urge Incontinence   Current Outpatient Medications on File Prior to Visit  Medication Sig Dispense Refill  . aspirin 81 MG tablet Take 81 mg by mouth daily.    . Aspirin-Acetaminophen-Caffeine (GOODY HEADACHE PO) Take 1 packet by mouth daily as needed for headaches    . gabapentin (NEURONTIN) 300 MG capsule Take 300 mg by mouth 3 (three) times daily.    [name] glimepiride (AMARYL) 4 MG tablet Take 1 tablet by mouth once daily 90 tablet 2  . HYDROcodone-acetaminophen (NORCO/VICODIN)  5-325 MG tablet Take 1 tablet by mouth 2 (two) times daily.    [name] lisinopril (ZESTRIL) 20 MG tablet Take 1 tablet (20 mg total) by mouth in the morning and at bedtime. 180 tablet 2  . metFORMIN (GLUCOPHAGE-XR) 500 MG 24 hr tablet TAKE 1 TABLET BY MOUTH ONCE DAILY WITH EVENING MEAL 90 tablet 2  . amLODipine (NORVASC) 2.5 MG tablet Take 1 tablet (2.5 mg total) by mouth in the morning. 90 tablet 3   No current facility-administered medications on file prior to visit.    Social Hx   Social History   Socioeconomic  History  . Marital status: Legally Separated    Spouse name: Not on file  . Number of children: 2  . Years of education: Not on file  . Highest education level: Not on file  Occupational History  . Occupation: unemployed    Comment: works for himself  Tobacco Use  . Smoking status: Current Every Day Smoker    Packs/day: 0.50    Years: 25.00    Pack years: 12.50    Types: Cigarettes  . Smokeless tobacco: Never Used  Substance and Sexual Activity  . Alcohol use: Not Currently    Alcohol/week: 0.0 standard drinks  . Drug use: No  . Sexual activity: Not Currently  Other Topics Concern  . Not on file  Social History Narrative   2 siblings died with leukemia around age 61   Social Determinants of Health   Financial Resource Strain: Low Risk   . Difficulty of Paying Living Expenses: Not hard at all  Food Insecurity: No Food Insecurity  . Worried About Programme researcher, broadcasting/film/video in the Last Year: Never true  . Ran Out of Food in the Last Year: Never true  Transportation Needs: No Transportation Needs  . Lack of Transportation (Medical): No  . Lack of Transportation (Non-Medical): No  Physical Activity: Inactive  . Days of Exercise per Week: 0 days  . Minutes of Exercise per Session: 30 min  Stress: Stress Concern Present  . Feeling of Stress : To some extent  Social Connections: Socially Isolated  . Frequency of Communication with Friends and Family: Three times a week  . Frequency of Social Gatherings with Friends and Family: Never  . Attends Religious Services: Never  . Active Member of Clubs or Organizations: No  . Attends Banker Meetings: Never  . Marital Status: Divorced   Past Medical History:  Diagnosis Date  . Allergic rhinitis 09/04/2012  . AV block    history of  . 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 ~  Jan11:  Last seen by Cards & pacer reprogrammed...    . Cardiac pacemaker in situ   . Carotid artery disease (HCC) 07/27/2010   R/O PERIPHERAL VASCULAR DISEASE (ICD-443.9) - on ASA 81mg /d... exam 11/09 shows gr 1-2 sys murmur LSB, but also has prominent bruit to base of right side of his neck w/ right > left Carotid Bruit... ~  CDopplers 11/09 showed severe plaque right ICA, & min plaque on left... no signif ICA stenoses per report. ~  f/u CDopplers 12/10 showed mild irreg plaque bilat in bulbs & intimal thickening in righ  . COPD (chronic obstructive pulmonary disease) with chronic bronchitis (HCC) 03/26/2008   CHRONIC OBSTRUCTIVE PULMONARY DISEASE (ICD-496) - hx recurrent  bronchitic episodes in the past... smokes 1/2 - 1ppd x yrs... ~  PFT's 11/08 showed FVC= 3.85 (79%), FEV1= 2.19 (55%), %1sec= 57, mid-flows= 32% predicted:  all c/w mod airflow obstruction...  ~  CXR 12/10 showed left subclav pacer, mild hyperinflation, NAD...    . Dermatitis, contact    due to poison ivy  . Diabetes mellitus with neuropathy (HCC) 12/20/2016  . Dyslipidemia 03/06/2012  . Essential hypertension 04/10/2007    HYPERTENSION (ICD-401.9) - prev on Micardis 80mg /d then switched to Losartan 100mg /d, but he lost his job in 2011& couldn't afford Rx;  He called 2/12 w/ this news & we phoned in Lisinopril 20mg /d... ~  2DEcho 11/09 showed sl incr LVwall thickness, norm LVF w/ EF= 55-60%, no regional wall motion abn, mild DD, mild MR... ~  Myoview 12/09 showed LBBB, no perfusion defects, abn septal motion & EF  . GERD 03/26/2008    GERD (ICD-530.81) - prev mild GERD symptoms that improved after cholecystectomy on 2000... uses OTC PPI as needed... ~  he is due for routine screening colonoscopy...      . GERD (gastroesophageal reflux disease)   . Headache(784.0)   . Hypertension   . Mixed hyperlipidemia 06/19/2019  . Peripheral vascular disease (HCC)   . Sciatica 11/18/2019  . Tobacco use  disorder    Family History  Problem Relation Age of Onset  . Diabetes Brother     Review of Systems  Constitutional: Negative.  Negative for diaphoresis and fatigue.  HENT: Negative for congestion, ear pain and sore throat.   Eyes: Positive for visual disturbance.  Respiratory: Negative for apnea, cough, chest tightness and shortness of breath.   Cardiovascular: Negative for chest pain.  Gastrointestinal: Negative for abdominal pain, constipation, diarrhea, nausea and vomiting.  Endocrine: Negative for polyuria.  Genitourinary: Negative for dysuria, frequency and urgency.  Musculoskeletal: Positive for back pain. Negative for arthralgias and myalgias.  Skin: Negative.   Neurological: Negative for dizziness and headaches.  Psychiatric/Behavioral: Negative for agitation and sleep disturbance. The patient is not nervous/anxious.      Objective:  BP 130/76 (BP Location: Right Arm, Patient Position: Sitting, Cuff Size: Normal)   Pulse 100   Temp 98.4 F (36.9 C) (Temporal)   Ht 5\' 9"  (1.753 m)   Wt 151 lb (68.5 kg)   SpO2 95%   BMI 22.30 kg/m   BP/Weight 06/16/2020 02/04/2020 01/19/2020  Systolic BP 130 147 144  Diastolic BP 76 64 62  Wt. (Lbs) 151 144 141  BMI 22.3 21.27 20.23    Physical Exam Vitals reviewed.  Constitutional:      Appearance: Normal appearance.  Cardiovascular:     Rate and Rhythm: Normal rate and regular rhythm.  Pulmonary:     Effort: Pulmonary effort is normal.     Breath sounds: Normal breath sounds.  Abdominal:     General: Bowel sounds are normal.  Musculoskeletal:        General: Normal range of motion.     Cervical back: Normal range of motion.  Skin:    General: Skin is warm.  Neurological:     Mental Status: He is alert.  Psychiatric:        Mood and Affect: Mood normal.        Behavior: Behavior normal.     Lab Results  Component Value Date   WBC 10.4 11/18/2019   HGB 15.7 11/18/2019   HCT 46.1 11/18/2019   PLT 267 11/18/2019    GLUCOSE 139 (  H) 11/18/2019   CHOL 150 11/18/2019   TRIG 108 11/18/2019   HDL 33 (L) 11/18/2019   LDLDIRECT 113.0 12/20/2017   LDLCALC 97 11/18/2019   ALT 14 11/18/2019   AST 14 11/18/2019   NA 142 11/18/2019   K 4.2 11/18/2019   CL 104 11/18/2019   CREATININE 0.89 11/18/2019   BUN 12 11/18/2019   CO2 23 11/18/2019   TSH 1.46 12/20/2017   PSA 0.63 12/20/2017   HGBA1C 7.3 (H) 11/18/2019   MICROALBUR 2.9 (H) 02/07/2017      Assessment & Plan:  1. COPD (chronic obstructive pulmonary disease) with chronic bronchitis (HCC) - AMB Referral to [facility] An individualize plan was formulated for care of COPD.  Treatment is evidence based.  She will continue on inhalers, avoid smoking and smoke.  Regular exercise with help with dyspnea. Routine follow ups and medication compliance is needed.  2. Type 2 diabetes mellitus with other specified complication, without long-term current use of insulin (HCC) - AMB Referral to [facility] An individual care plan for diabetes was established and reinforced today.  The patient's status was assessed using clinical findings on exam, labs and diagnostic testing. Patient success at meeting goals based on disease specific evidence-based guidelines and found to be fair controlled. Medications were assessed and patient's understanding of the medical issues , including barriers were assessed. Recommend adherence to a diabetic diet, a graduated exercise program, HgbA1c level is checked quarterly, and urine microalbumin performed yearly .  Annual mono-filament sensation testing performed. Lower blood pressure and control hyperlipidemia is important. Get annual eye exams and annual flu shots and smoking cessation discussed.  Self management goals were discussed.  3. Bilateral carotid artery disease, unspecified type (HCC) - AMB Referral to [facility] This has been corected  4. Complete AV block [facility]) He has  pacemaker that is functioning well  5. Routine general medical examination at a health care facility AWV performed, we discussed living will, he is hesitant at present      These are the goals we discussed: Goals    . Increase physical activity    . Quit Smoking        This is a list of the screening recommended for you and due dates:  Health Maintenance  Topic Date Due  .  Hepatitis C: One time screening is recommended by Center for Disease Control  (CDC) for  adults born from [birth year] through 1966.   Never done  . COVID-19 Vaccine (1) Never done  . Eye exam for diabetics  Never done  . HIV Screening  Never done  . Tetanus Vaccine  Never done  . Colon Cancer Screening  Never done  . Flu Shot  12/07/2019  . Pneumonia vaccines (1 of 2 - PCV13) 12/21/2019  . Hemoglobin A1C  05/20/2020  . Complete foot exam   11/17/2020      AN INDIVIDUALIZED CARE PLAN: was established or reinforced today.   SELF MANAGEMENT: The patient and I together assessed ways to personally work towards obtaining the recommended goals  Support needs The patient and/or family needs were assessed and services were offered and not necessary at this time.    Follow-up: Return if symptoms worsen or fail to improve.  Brent Bulla, MD [facility] [phone]

## 2020-06-17 NOTE — Progress Notes (Signed)
Remote pacemaker transmission.   

## 2020-06-30 DIAGNOSIS — M549 Dorsalgia, unspecified: Secondary | ICD-10-CM | POA: Diagnosis not present

## 2020-06-30 DIAGNOSIS — M48061 Spinal stenosis, lumbar region without neurogenic claudication: Secondary | ICD-10-CM | POA: Diagnosis not present

## 2020-06-30 DIAGNOSIS — M5137 Other intervertebral disc degeneration, lumbosacral region: Secondary | ICD-10-CM | POA: Diagnosis not present

## 2020-06-30 DIAGNOSIS — G894 Chronic pain syndrome: Secondary | ICD-10-CM | POA: Diagnosis not present

## 2020-06-30 DIAGNOSIS — M47816 Spondylosis without myelopathy or radiculopathy, lumbar region: Secondary | ICD-10-CM | POA: Diagnosis not present

## 2020-06-30 DIAGNOSIS — Z1389 Encounter for screening for other disorder: Secondary | ICD-10-CM | POA: Diagnosis not present

## 2020-06-30 DIAGNOSIS — M5136 Other intervertebral disc degeneration, lumbar region: Secondary | ICD-10-CM | POA: Diagnosis not present

## 2020-07-05 ENCOUNTER — Telehealth: Payer: Self-pay

## 2020-07-05 NOTE — Progress Notes (Signed)
    Chronic Care Management Pharmacy Assistant   Name: Andrew Wiggins  MRN: [id number] DOB: [birth year]  Reason for Encounter: Initial questions for Andrew Wiggins, CPP   PCP : Abigail Miyamoto, MD  Allergies:  No Known Allergies  Medications: Outpatient Encounter Medications as of 07/05/2020  Medication Sig  . amLODipine (NORVASC) 2.5 MG tablet Take 1 tablet (2.5 mg total) by mouth in the morning.  [name] aspirin 81 MG tablet Take 81 mg by mouth daily.  . Aspirin-Acetaminophen-Caffeine (GOODY HEADACHE PO) Take 1 packet by mouth daily as needed for headaches  . gabapentin (NEURONTIN) 300 MG capsule Take 300 mg by mouth 3 (three) times daily.  [name] glimepiride (AMARYL) 4 MG tablet Take 1 tablet by mouth once daily  . HYDROcodone-acetaminophen (NORCO/VICODIN) 5-325 MG tablet Take 1 tablet by mouth 2 (two) times daily.  [name] lisinopril (ZESTRIL) 20 MG tablet Take 1 tablet (20 mg total) by mouth in the morning and at bedtime.  . metFORMIN (GLUCOPHAGE-XR) 500 MG 24 hr tablet TAKE 1 TABLET BY MOUTH ONCE DAILY WITH EVENING MEAL   No facility-administered encounter medications on file as of 07/05/2020.    Current Diagnosis: Patient Active Problem List   Diagnosis Date Noted  . Routine general medical examination at a health care facility 06/16/2020  . Diabetes mellitus (HCC) 02/04/2020  . Tobacco use 02/04/2020  . Complete AV block (HCC)   . Dermatitis, contact   . GERD (gastroesophageal reflux disease)   . Hypertension   . Peripheral vascular disease (HCC)   . Sciatica 11/18/2019  . Mixed hyperlipidemia 06/19/2019  . Diabetes mellitus with neuropathy (HCC) 12/20/2016  . Allergic rhinitis 09/04/2012  . Dyslipidemia 03/06/2012  . Carotid artery disease (HCC) 07/27/2010  . 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 Cardiac pacemaker in situ 11/26/2008  . CIGARETTE SMOKER 03/26/2008  . COPD (chronic obstructive pulmonary disease) with chronic bronchitis (HCC) 03/26/2008  . GERD 03/26/2008  .  Essential hypertension 04/10/2007  . Headache(784.0) 04/10/2007    Have you seen any other providers since your last visit? No  Any changes in your medications or health? Pain clinic has increased hydrocodone to 7.5mg   Any side effects from any medications? No side effects reported  Do you have an symptoms or problems not managed by your medications? Patient has no additional unmanaged problems.  Any concerns about your health right now? Patient is just having increased back pain.  Has your provider asked that you check blood pressure, blood sugar, or follow special diet at home? Patient does check his blood pressures, he needs test strips and lancets to test his glucose.  He does watch his diet.  Do you get any type of exercise on a regular basis? Patient stated he is currently taking physical therapy.  Can you think of a goal you would like to reach for your health? Patient would like his back pain improved.  Do you have any problems getting your medications? Patient has no problems getting his medication.  Is there anything that you would like to discuss during the appointment? He could not think of anything.  Patient knows to have medications.  Follow-Up:  Pharmacist Review  Andrew Wiggins, CPP  Leilani Able, [facility] Clinical Pharmacist Assistant [phone]

## 2020-07-06 ENCOUNTER — Telehealth: Payer: PPO

## 2020-07-06 NOTE — Progress Notes (Deleted)
Chronic Care Management Pharmacy Note  07/06/2020 Name:  AKEEL REFFNER MRN:  [id number] DOB:  1955/06/23  Subjective: Andrew Wiggins is an 65 y.o. year old male who is a primary patient of [name], [name], MD.  The CCM team was consulted for assistance with disease management and care coordination needs.    Engaged with patient by telephone for initial visit in response to provider referral for pharmacy case management and/or care coordination services.   Consent to Services:  The patient was given the following information about Chronic Care Management services today, agreed to services, and gave verbal consent: 1. CCM service includes personalized support from designated clinical staff supervised by the primary care provider, including individualized plan of care and coordination with other care providers 2. 24/7 contact phone numbers for assistance for urgent and routine care needs. 3. Service will only be billed when office clinical staff spend 20 minutes or more in a month to coordinate care. 4. Only one practitioner may furnish and bill the service in a calendar month. 5.The patient may stop CCM services at any time (effective at the end of the month) by phone call to the office staff. 6. The patient will be responsible for cost sharing (co-pay) of up to 20% of the service fee (after annual deductible is met). Patient agreed to services and consent obtained.  Patient Care Team: [name], MD as PCP - General (Family Medicine) [name], DO as PCP - Cardiology (Cardiology) [name], MD as PCP - Electrophysiology (Cardiology) [name], [facility] as Pharmacist (Pharmacist)  Recent office visits: 06/16/2020 - increase physical activity and quit smoking.  Recent consult visits: 04/13/2020 - ortho - back pain with radiating pain down leg 03/09/2020 - Ortho - note not available 02/27/2020 - Ortho - not not available 02/04/2020 - cardio - bp is not at target  likely due to pain. Patient will need stress test before potential surgery. Patient declined statin. Smoking cessation advised.  02/03/2020 - Ortho - note not available 01/19/2020 - EP Cardio - pacemaker functioning well. Mildly elevated bp due to pain. No medication changes.  Hospital visits:  02/07/2020 - ED visit for nausea/COVID - note not available Objective:  Lab Results  Component Value Date   CREATININE 0.89 11/18/2019   BUN 12 11/18/2019   GFR 83.08 12/20/2017   GFRNONAA 90 11/18/2019   GFRAA 104 11/18/2019   NA 142 11/18/2019   K 4.2 11/18/2019   CALCIUM 9.5 11/18/2019   CO2 23 11/18/2019    Lab Results  Component Value Date/Time   HGBA1C 7.3 (H) 11/18/2019 10:23 AM   HGBA1C 7.1 (H) 06/20/2019 10:38 AM   FRUCTOSAMINE 284 02/07/2017 02:03 PM   GFR 83.08 12/20/2017 01:20 PM   GFR 98.24 07/04/2017 12:36 PM   MICROALBUR 2.9 (H) 02/07/2017 02:03 PM    Last diabetic Eye exam: No results found for: HMDIABEYEEXA  Last diabetic Foot exam: No results found for: HMDIABFOOTEX   Lab Results  Component Value Date   CHOL 150 11/18/2019   HDL 33 (L) 11/18/2019   LDLCALC 97 11/18/2019   LDLDIRECT 113.0 12/20/2017   TRIG 108 11/18/2019   CHOLHDL 4.5 11/18/2019    Hepatic Function Latest Ref Rng & Units 11/18/2019 06/20/2019 12/20/2017  Total Protein 6.0 - 8.5 g/dL 6.8 7.2 7.5  Albumin 3.8 - 4.8 g/dL 4.3 4.3 4.7  AST 0 - 40 IU/L _0 ALT 0 - 44 IU/L _1 Alk  Phosphatase 48 - 121 IU/L 98 107 99  Total Bilirubin 0.0 - 1.2 mg/dL 0.5 0.4 0.5  Bilirubin, Direct 0.0 - 0.3 mg/dL - - -    Lab Results  Component Value Date/Time   TSH 1.46 12/20/2017 01:20 PM   TSH 1.20 06/07/2016 11:15 AM    CBC Latest Ref Rng & Units 11/18/2019 06/20/2019 12/20/2017  WBC 3.4 - 10.8 x10E3/uL 10.4 9.9 10.8(H)  Hemoglobin 13.0 - 17.7 g/dL 15.7 16.1 17.2(H)  Hematocrit 37.5 - 51.0 % 46.1 47.4 50.6  Platelets 150 - 450 x10E3/uL 267 268 166.0    No results found for: VD25OH  Clinical  ASCVD: {YES/NO:21197} The 10-year ASCVD risk score Mikey Bussing DC Jr., et al., 2013) is: 37.3%   Values used to calculate the score:     Age: 70 years     Sex: Male     Is Non-Hispanic African American: No     Diabetic: Yes     Tobacco smoker: Yes     Systolic Blood Pressure: 174 mmHg     Is BP treated: Yes     HDL Cholesterol: 33 mg/dL     Total Cholesterol: 150 mg/dL    Depression screen [facility] 2/9 06/16/2020 11/18/2019 10/24/2013  Decreased Interest 0 0 0  Down, Depressed, Hopeless 0 0 0  PHQ - 2 Score 0 0 0     ***Other: (CHADS2VASc if Afib, MMRC or CAT for COPD, ACT, DEXA)  Social History   Tobacco Use  Smoking Status Current Every Day Smoker  . Packs/day: 0.50  . Years: 25.00  . Pack years: 12.50  . Types: Cigarettes  Smokeless Tobacco Never Used   BP Readings from Last 3 Encounters:  06/16/20 130/76  02/04/20 (!) 147/64  01/19/20 (!) 144/62   Pulse Readings from Last 3 Encounters:  06/16/20 100  02/04/20 (!) 106  01/19/20 (!) 111   Wt Readings from Last 3 Encounters:  06/16/20 151 lb (68.5 kg)  02/04/20 144 lb (65.3 kg)  01/19/20 141 lb (64 kg)    Assessment/Interventions: Review of patient past medical history, allergies, medications, health status, including review of consultants reports, laboratory and other test data, was performed as part of comprehensive evaluation and provision of chronic care management services.   SDOH:  (Social Determinants of Health) assessments and interventions performed: Yes   CCM Care Plan  No Known Allergies  Medications Reviewed Today    Reviewed by [name], MD (Physician) on 06/16/20 at 1418  Med List Status: <None>  Medication Order Taking? Sig Documenting Provider Last Dose Status Informant  amLODipine (NORVASC) 2.5 MG tablet [id number]  Take 1 tablet (2.5 mg total) by mouth in the morning. [name], Kardie, DO  Expired 03/30/20 2359   aspirin 81 MG tablet [id number] Yes Take 81 mg by mouth daily. [provider] Taking Active   Aspirin-Acetaminophen-Caffeine (GOODY HEADACHE PO) [id number] Yes Take 1 packet by mouth daily as needed for headaches [provider] Taking Active   gabapentin (NEURONTIN) 300 MG capsule [id number] Yes Take 300 mg by mouth 3 (three) times daily. [provider] Taking Active   glimepiride (AMARYL) 4 MG tablet [id number] Yes Take 1 tablet by mouth once daily [name], MD Taking Active   HYDROcodone-acetaminophen (NORCO/VICODIN) 5-325 MG tablet [id number] Yes Take 1 tablet by mouth 2 (two) times daily. [provider] Taking Active   lisinopril (ZESTRIL) 20 MG tablet [id number] Yes Take 1 tablet (20 mg total) by mouth in the morning and at  bedtime. [name], DO Taking Active   metFORMIN (GLUCOPHAGE-XR) 500 MG 24 hr tablet [id number] Yes TAKE 1 TABLET BY MOUTH ONCE DAILY WITH EVENING MEAL [name], MD Taking Active           Patient Active Problem List   Diagnosis Date Noted  . Routine general medical examination at a health care facility 06/16/2020  . Diabetes mellitus ([place]) 02/04/2020  . Tobacco use 02/04/2020  . 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 Dermatitis, contact   . GERD (gastroesophageal reflux disease)   . Hypertension   . Peripheral vascular disease ([place])   . Sciatica 11/18/2019  . Mixed hyperlipidemia 06/19/2019  . Diabetes mellitus with neuropathy ([place]) 12/20/2016  . Allergic rhinitis 09/04/2012  . Dyslipidemia 03/06/2012  . Carotid artery disease ([place]) 07/27/2010  . 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 Cardiac pacemaker in situ 11/26/2008  . CIGARETTE SMOKER 03/26/2008  . COPD (chronic obstructive pulmonary disease) with chronic bronchitis ([place]) 03/26/2008  . GERD 03/26/2008  . Essential hypertension 04/10/2007  . Headache(784.0) 04/10/2007    Immunization History  Administered Date(s) Administered  . Influenza Whole 04/15/2009  . Influenza,inj,Quad PF,6+ Mos 06/07/2015, 06/07/2016  .  Pneumococcal Polysaccharide-23 04/15/2009    Conditions to be addressed/monitored:  Hypertension, Hyperlipidemia, Diabetes, GERD, COPD, Tobacco use and Sciatica  There are no care plans that you recently modified to display for this patient.    Medication Assistance: {MEDASSISTANCEINFO:25044}  Patient's preferred pharmacy is:  [facility] [address] [place] [address] Phone: [phone] Fax: [phone]  Uses pill box? {Yes or If no, why not?:20788} Pt endorses ***% compliance  We discussed: Benefits of medication synchronization, packaging and delivery as well as enhanced pharmacist oversight with [facility]. Patient decided to: {[facility]  Care Plan and Follow Up Patient Decision:  Patient agrees to Care Plan and Follow-up.  Plan: Telephone follow up appointment with care management team member scheduled for:  ***  ***    Current Barriers:  . {pharmacybarriers:24917} . ***  Pharmacist Clinical Goal(s):  [name] Over the next 90 days, patient will {PHARMACYGOALCHOICES:24921} through collaboration with PharmD and provider.  . ***  Interventions: . 1:1 collaboration with [name], MD regarding development and update of comprehensive plan of care as evidenced by provider attestation and co-signature . Inter-disciplinary care team collaboration (see longitudinal plan of care) . Comprehensive medication review performed; medication list updated in electronic medical record  Hypertension (BP goal {CHL HP [facility] Pharmacist BP ranges:[phone]}) -{US controlled/uncontrolled:25276} -Current treatment: . ***amlodipine 2.5 mg am . Lisinopril 20 mg am and bedtime -Medications previously tried: ***  -Current home readings: *** -Current dietary habits: *** -Current exercise habits: *** -{ACTIONS;DENIES/REPORTS:21021675::"Denies"} hypotensive/hypertensive symptoms -Educated on {CCM BP  Counseling:25124} -Counseled to monitor BP at home ***, document, and provide log at future appointments -{CCMPHARMDINTERVENTION:25122}  Hyperlipidemia/PVD: (LDL goal < ***) -{US controlled/uncontrolled:25276} -Current treatment: . ***aspirin 81 mg daily  -Medications previously tried: ***  -Current dietary patterns: *** -Current exercise habits: *** -Educated on {CCM HLD Counseling:25126} -{CCMPHARMDINTERVENTION:25122}  Diabetes (A1c goal {A1c goals:23924}) -{US controlled/uncontrolled:25276} -Current medications: . ***metformin xr 500 mg daily with evening meal  . Glimepiride 4 mg daily  -Medications previously tried: *** pioglitazone  -Current home glucose readings . fasting glucose: *** . post prandial glucose: *** -{ACTIONS;DENIES/REPORTS:21021675::"Denies"} hypoglycemic/hyperglycemic symptoms -Current meal patterns:  . breakfast: ***  . lunch: ***  . dinner: *** . snacks: *** . drinks: *** -Current exercise: *** -  Educated on{CCM DM COUNSELING:25123} -Counseled to check feet daily and get yearly eye exams -{CCMPHARMDINTERVENTION:25122}  COPD (Goal: control symptoms and prevent exacerbations) -{US controlled/uncontrolled:25276} -Current treatment  . *** -Medications previously tried: Transport planner, advair -Gold Grade: {CHL HP [facility] Pharm COPD Gold OFHQR:[phone]} -Current COPD Classification:  {CHL HP [facility] Pharm COPD [phone]} -MMRC/CAT score: *** -Pulmonary function testing: *** -Exacerbations requiring treatment in last 6 months: *** -Patient {Actions; denies-reports:120008} consistent use of maintenance inhaler -Frequency of rescue inhaler use: *** -Counseled on {CCMINHALERCOUNSELING:25121} -{CCMPHARMDINTERVENTION:25122}  Tobacco use (Goal ***) -{US controlled/uncontrolled:25276} -Previous quit attempts: *** -Current treatment  . *** -Patient smokes {Time to first cigarette:23873} -Patient triggers include: {Smoking  Triggers:23882} -On a scale of 1-10, reports MOTIVATION to quit is *** -On a scale of 1-10, reports CONFIDENCE in quitting is *** -{Smoking Cessation Counseling:23883} -{CCMPHARMDINTERVENTION:25122} *** (Goal: ***) -{US controlled/uncontrolled:25276} -Current treatment  . ***gabapentin 300 mg three times daily  . Hydrocodone-acetaminophen 5-325 mg bid -Medications previously tried: *** ketorolac, tramadol, pregabalin  -{CCMPHARMDINTERVENTION:25122}  Health Maintenance -Vaccine gaps: *** -Current therapy:  . ***Good Headache packet prn for headaches -Educated on {ccm supplement counseling:25128} -{CCM Patient satisfied:25129} -{CCMPHARMDINTERVENTION:25122}   Patient Goals/Self-Care Activities . Over the next *** days, patient will:  - take medications as prescribed focus on medication adherence by *** check glucose ***, document, and provide at future appointments check blood pressure ***, document, and provide at future appointments target a minimum of 150 minutes of moderate intensity exercise weekly engage in dietary modifications by ***  Follow Up Plan: Telephone follow up appointment with care management team member scheduled for: ***

## 2020-07-08 ENCOUNTER — Telehealth: Payer: Self-pay | Admitting: Legal Medicine

## 2020-07-08 NOTE — Progress Notes (Signed)
  Chronic Care Management   Outreach Note  07/08/2020 Name: Andrew Wiggins MRN: [id number] DOB: 11/18/1955  Referred by: Abigail Miyamoto, MD Reason for referral : Chronic Care Management   A second unsuccessful telephone outreach was attempted today. The patient was referred to pharmacist for assistance with care management and care coordination.  Follow Up Plan:   Aggie Hacker  [facility] Scheduler

## 2020-07-12 ENCOUNTER — Telehealth: Payer: Self-pay | Admitting: Legal Medicine

## 2020-07-12 NOTE — Progress Notes (Signed)
°  Chronic Care Management   Outreach Note  07/12/2020 Name: SHADEED COLBERG MRN: [id number] DOB: 1955-09-01  Referred by: Abigail Miyamoto, MD Reason for referral : Chronic Care Management   A second unsuccessful telephone outreach was attempted today. The patient was referred to pharmacist for assistance with care management and care coordination.  Follow Up Plan:   Aggie Hacker  [facility] Scheduler

## 2020-07-28 DIAGNOSIS — M5136 Other intervertebral disc degeneration, lumbar region: Secondary | ICD-10-CM | POA: Diagnosis not present

## 2020-07-28 DIAGNOSIS — Z1389 Encounter for screening for other disorder: Secondary | ICD-10-CM | POA: Diagnosis not present

## 2020-07-28 DIAGNOSIS — M549 Dorsalgia, unspecified: Secondary | ICD-10-CM | POA: Diagnosis not present

## 2020-07-28 DIAGNOSIS — M47816 Spondylosis without myelopathy or radiculopathy, lumbar region: Secondary | ICD-10-CM | POA: Diagnosis not present

## 2020-07-28 DIAGNOSIS — M48061 Spinal stenosis, lumbar region without neurogenic claudication: Secondary | ICD-10-CM | POA: Diagnosis not present

## 2020-07-28 DIAGNOSIS — M5137 Other intervertebral disc degeneration, lumbosacral region: Secondary | ICD-10-CM | POA: Diagnosis not present

## 2020-07-28 DIAGNOSIS — G894 Chronic pain syndrome: Secondary | ICD-10-CM | POA: Diagnosis not present

## 2020-07-29 LAB — HM DIABETES EYE EXAM

## 2020-08-02 ENCOUNTER — Encounter: Payer: Self-pay | Admitting: Legal Medicine

## 2020-08-03 ENCOUNTER — Other Ambulatory Visit: Payer: Self-pay

## 2020-08-03 DIAGNOSIS — I4430 Unspecified atrioventricular block: Secondary | ICD-10-CM | POA: Insufficient documentation

## 2020-08-04 ENCOUNTER — Other Ambulatory Visit: Payer: Self-pay

## 2020-08-04 ENCOUNTER — Ambulatory Visit: Payer: PPO | Admitting: Cardiology

## 2020-08-04 ENCOUNTER — Encounter: Payer: Self-pay | Admitting: Cardiology

## 2020-08-04 VITALS — BP 164/78 | HR 85 | Ht 70.0 in | Wt 149.4 lb

## 2020-08-04 DIAGNOSIS — I10 Essential (primary) hypertension: Secondary | ICD-10-CM

## 2020-08-04 DIAGNOSIS — Z720 Tobacco use: Secondary | ICD-10-CM | POA: Diagnosis not present

## 2020-08-04 DIAGNOSIS — F17200 Nicotine dependence, unspecified, uncomplicated: Secondary | ICD-10-CM

## 2020-08-04 DIAGNOSIS — I442 Atrioventricular block, complete: Secondary | ICD-10-CM | POA: Diagnosis not present

## 2020-08-04 DIAGNOSIS — E782 Mixed hyperlipidemia: Secondary | ICD-10-CM | POA: Diagnosis not present

## 2020-08-04 MED ORDER — AMLODIPINE BESYLATE 5 MG PO TABS: 5.0000 mg | ORAL_TABLET | Freq: Every day | ORAL | 3 refills | Status: DC

## 2020-08-04 NOTE — Progress Notes (Signed)
Cardiology Office Note:    Date:  08/04/2020   ID:  Andrew Wiggins, Andrew Wiggins 1955/09/22, MRN [id number]  PCP:  Abigail Miyamoto, MD  Cardiologist:  Thomasene Ripple, DO  Electrophysiologist:  Will Jorja Loa, MD   Referring MD: Abigail Miyamoto,*   I am doing fine  History of Present Illness:    Andrew Wiggins is a 65 y.o. male with a hx of complete heart block status post pacemaker, hypertension (will also concern for whitecoat hypertension as noted in his chart), COPD.  I saw the patient in February 04, 2020 at that time he appeared to be doing well from a cardiovascular standpoint.  He was finally orthopedic surgery.  At that time he was cleared for surgery. Since I last saw the patient he tells me that he did not go through with the back surgery and has been doing medications.  He is now on opioids for pain control for his back pain.  He is considering cataract surgery.  Here for no complaints at this time.   Past Medical History:  Diagnosis Date  . Allergic rhinitis 09/04/2012  . AV block    history of  . Cardiac conduction disorder 11/26/2008    Hx of AV BLOCK (ICD-426.9) & CARDIAC PACEMAKER IN SITU (ICD-V45.01) - he was adm 1/10 w/ symptomatic bradycardias & 2:1 heart block requiring pacemaker- followed by DrTaylor... ~  Jan11:  Last seen by Cards & pacer reprogrammed...    . Cardiac pacemaker in situ   . Carotid artery disease (HCC) 07/27/2010   R/O PERIPHERAL VASCULAR DISEASE (ICD-443.9) - on ASA 81mg /d... exam 11/09 shows gr 1-2 sys murmur LSB, but also has prominent bruit to base of right side of his neck w/ right > left Carotid Bruit... ~  CDopplers 11/09 showed severe plaque right ICA, & min plaque on left... no signif ICA stenoses per report. ~  f/u CDopplers 12/10 showed mild irreg plaque bilat in bulbs & intimal thickening in righ  . COPD (chronic obstructive pulmonary disease) with chronic bronchitis (HCC) 03/26/2008   CHRONIC OBSTRUCTIVE PULMONARY DISEASE  (ICD-496) - hx recurrent bronchitic episodes in the past... smokes 1/2 - 1ppd x yrs... ~  PFT's 11/08 showed FVC= 3.85 (79%), FEV1= 2.19 (55%), %1sec= 57, mid-flows= 32% predicted:  all c/w mod airflow obstruction...  ~  CXR 12/10 showed left subclav pacer, mild hyperinflation, NAD...    . Dermatitis, contact    due to poison ivy  . Diabetes mellitus with neuropathy (HCC) 12/20/2016  . Dyslipidemia 03/06/2012  . Essential hypertension 04/10/2007    HYPERTENSION (ICD-401.9) - prev on Micardis 80mg /d then switched to Losartan 100mg /d, but he lost his job in 2011& couldn't afford Rx;  He called 14/07/2006 2/12 w/ this news & we phoned in Lisinopril 20mg /d... ~  2DEcho 11/09 showed sl incr LVwall thickness, norm LVF w/ EF= 55-60%, no regional wall motion abn, mild DD, mild MR... ~  Myoview 12/09 showed LBBB, no perfusion defects, abn septal motion & EF  . GERD 03/26/2008    GERD (ICD-530.81) - prev mild GERD symptoms that improved after cholecystectomy on 2000... uses OTC PPI as needed... ~  he is due for routine screening colonoscopy...      . GERD (gastroesophageal reflux disease)   . Headache(784.0)   . Hypertension   . Mixed hyperlipidemia 06/19/2019  . Peripheral vascular disease (HCC)   . Sciatica 11/18/2019  . Tobacco use disorder     Past Surgical History:  Procedure Laterality Date  .  laproscopic cholecystectomy  2000   by Dr. Odie SeraHoxsworth  . PACEMAKER INSERTION  03/2008   by Dr. Ladona Ridgelaylor    Current Medications: Current Meds  Medication Sig  . amLODipine (NORVASC) 5 MG tablet Take 1 tablet (5 mg total) by mouth daily.  [name]. aspirin 81 MG tablet Take 81 mg by mouth daily.  . Aspirin-Acetaminophen-Caffeine (GOODY HEADACHE PO) Take 1 packet by mouth daily as needed for headaches  . diclofenac (VOLTAREN) 75 MG EC tablet Take 75 mg by mouth 2 (two) times daily.  [name]. gabapentin (NEURONTIN) 300 MG capsule Take 300 mg by mouth 3 (three) times daily.  [name]. glimepiride (AMARYL) 4 MG tablet Take 1 tablet by mouth  once daily  . lisinopril (ZESTRIL) 20 MG tablet Take 1 tablet (20 mg total) by mouth in the morning and at bedtime.  . metFORMIN (GLUCOPHAGE-XR) 500 MG 24 hr tablet TAKE 1 TABLET BY MOUTH ONCE DAILY WITH EVENING MEAL  . [DISCONTINUED] amLODipine (NORVASC) 2.5 MG tablet Take 1 tablet (2.5 mg total) by mouth in the morning.  . [DISCONTINUED] HYDROcodone-acetaminophen (NORCO/VICODIN) 5-325 MG tablet Take 1 tablet by mouth 2 (two) times daily.     Allergies:   Patient has no known allergies.   Social History   Socioeconomic History  . Marital status: Legally Separated    Spouse name: Not on file  . Number of children: 2  . Years of education: Not on file  . Highest education level: Not on file  Occupational History  . Occupation: unemployed    Comment: works for himself  Tobacco Use  . Smoking status: Current Every Day Smoker    Packs/day: 0.50    Years: 25.00    Pack years: 12.50    Types: Cigarettes  . Smokeless tobacco: Never Used  Substance and Sexual Activity  . Alcohol use: Not Currently    Alcohol/week: 0.0 standard drinks  . Drug use: No  . Sexual activity: Not Currently  Other Topics Concern  . Not on file  Social History Narrative   2 siblings died with leukemia around age 66   Social Determinants of Health   Financial Resource Strain: Low Risk   . Difficulty of Paying Living Expenses: Not hard at all  Food Insecurity: No Food Insecurity  . Worried About Programme researcher, broadcasting/film/videounning Out of Food in the Last Year: Never true  . Ran Out of Food in the Last Year: Never true  Transportation Needs: No Transportation Needs  . Lack of Transportation (Medical): No  . Lack of Transportation (Non-Medical): No  Physical Activity: Inactive  . Days of Exercise per Week: 0 days  . Minutes of Exercise per Session: 30 min  Stress: Stress Concern Present  . Feeling of Stress : To some extent  Social Connections: Socially Isolated  . Frequency of Communication with Friends and Family: Three times  a week  . Frequency of Social Gatherings with Friends and Family: Never  . Attends Religious Services: Never  . Active Member of Clubs or Organizations: No  . Attends BankerClub or Organization Meetings: Never  . Marital Status: Divorced     Family History: The patient's family history includes Diabetes in his brother.  ROS:   Review of Systems  Constitution: Negative for decreased appetite, fever and weight gain.  HENT: Negative for congestion, ear discharge, hoarse voice and sore throat.   Eyes: Negative for discharge, redness, vision loss in right eye and visual halos.  Cardiovascular: Negative for chest pain, dyspnea on exertion, leg swelling, orthopnea  and palpitations.  Respiratory: Negative for cough, hemoptysis, shortness of breath and snoring.   Endocrine: Negative for heat intolerance and polyphagia.  Hematologic/Lymphatic: Negative for bleeding problem. Does not bruise/bleed easily.  Skin: Negative for flushing, nail changes, rash and suspicious lesions.  Musculoskeletal: Negative for arthritis, joint pain, muscle cramps, myalgias, neck pain and stiffness.  Gastrointestinal: Negative for abdominal pain, bowel incontinence, diarrhea and excessive appetite.  Genitourinary: Negative for decreased libido, genital sores and incomplete emptying.  Neurological: Negative for brief paralysis, focal weakness, headaches and loss of balance.  Psychiatric/Behavioral: Negative for altered mental status, depression and suicidal ideas.  Allergic/Immunologic: Negative for HIV exposure and persistent infections.    EKGs/Labs/Other Studies Reviewed:    The following studies were reviewed today:   EKG: None today  Recent Labs: 11/18/2019: ALT 14; BUN 12; Creatinine, Ser 0.89; Hemoglobin 15.7; Platelets 267; Potassium 4.2; Sodium 142  Recent Lipid Panel    Component Value Date/Time   CHOL 150 11/18/2019 1023   TRIG 108 11/18/2019 1023   HDL 33 (L) 11/18/2019 1023   CHOLHDL 4.5 11/18/2019  1023   CHOLHDL 5 12/20/2017 1320   VLDL 44.0 (H) 12/20/2017 1320   LDLCALC 97 11/18/2019 1023   LDLDIRECT 113.0 12/20/2017 1320    Physical Exam:    VS:  BP (!) 164/78   Pulse 85   Ht 5\' 10"  (1.778 m)   Wt 149 lb 6.4 oz (67.8 kg)   SpO2 97%   BMI 21.44 kg/m     Wt Readings from Last 3 Encounters:  08/04/20 149 lb 6.4 oz (67.8 kg)  06/16/20 151 lb (68.5 kg)  02/04/20 144 lb (65.3 kg)     GEN: Well nourished, well developed in no acute distress HEENT: Normal NECK: No JVD; No carotid bruits LYMPHATICS: No lymphadenopathy CARDIAC: S1S2 noted,RRR, no murmurs, rubs, gallops RESPIRATORY:  Clear to auscultation without rales, wheezing or rhonchi  ABDOMEN: Soft, non-tender, non-distended, +bowel sounds, no guarding. EXTREMITIES: No edema, No cyanosis, no clubbing MUSCULOSKELETAL:  No deformity  SKIN: Warm and dry NEUROLOGIC:  Alert and oriented x 3, non-focal PSYCHIATRIC:  Normal affect, good insight  ASSESSMENT:    1. Essential hypertension   2. Complete AV block (HCC)   3. CIGARETTE SMOKER   4. Mixed hyperlipidemia   5. Tobacco use    PLAN:     His blood pressure is elevated in the office today.  Manually taken by me 164/70 mmHg.  While like to do is increase his amlodipine to 5 mg daily.  We will continue his lisinopril 20 mg twice a day.  Patient able to undergo his cataract surgery with no need for any ischemic evaluation. The patient does not have any unstable cardiac conditions.  Upon evaluation today, he can achieve 4 METs or greater without anginal symptoms.  According to [facility] and AHA guidelines, he requires no further cardiac workup prior to his noncardiac surgery and should be at acceptable risk.  Our service is available as necessary in the perioperative period.   Smoking cessation advised.  He follows with EP for his pacemaker, most recent and remote interrogation was June 08, 2020 with normal device function.  This is being managed by his primary care  doctor.  No adjustments for antidiabetic medications were made today.    The patient is in agreement with the above plan. The patient left the office in stable condition.  The patient will follow up in next weeks due to medication change   Medication Adjustments/Labs and  Tests Ordered: Current medicines are reviewed at length with the patient today.  Concerns regarding medicines are outlined above.  No orders of the defined types were placed in this encounter.  Meds ordered this encounter  Medications  . amLODipine (NORVASC) 5 MG tablet    Sig: Take 1 tablet (5 mg total) by mouth daily.    Dispense:  180 tablet    Refill:  3    Patient Instructions  Medication Instructions:  Your physician has recommended you make the following change in your medication: INCREASE:  Amlodopine 5 mg daily *If you need a refill on your cardiac medications before your next appointment, please call your pharmacy*   Lab Work: None If you have labs (blood work) drawn today and your tests are completely normal, you will receive your results only by: [name] MyChart Message (if you have MyChart) OR . A paper copy in the mail If you have any lab test that is abnormal or we need to change your treatment, we will call you to review the results.   Testing/Procedures: None   Follow-Up: At [facility], you and your health needs are our priority.  As part of our continuing mission to provide you with exceptional heart care, we have created designated Provider Care Teams.  These Care Teams include your primary Cardiologist (physician) and Advanced Practice Providers (APPs -  Physician Assistants and Nurse Practitioners) who all work together to provide you with the care you need, when you need it.  We recommend signing up for the patient portal called "MyChart".  Sign up information is provided on this After Visit Summary.  MyChart is used to connect with patients for Virtual Visits (Telemedicine).  Patients are  able to view lab/test results, encounter notes, upcoming appointments, etc.  Non-urgent messages can be sent to your provider as well.   To learn more about what you can do with MyChart, go to [email or web address].    Your next appointment:   6 week(s)  The format for your next appointment:   In Person  Provider:   Thomasene Ripple, DO   Other Instructions      Adopting a Healthy Lifestyle.  Know what a healthy weight is for you (roughly BMI <25) and aim to maintain this   Aim for 7+ servings of fruits and vegetables daily   65-80+ fluid ounces of water or unsweet tea for healthy kidneys   Limit to max 1 drink of alcohol per day; avoid smoking/tobacco   Limit animal fats in diet for cholesterol and heart health - choose grass fed whenever available   Avoid highly processed foods, and foods high in saturated/trans fats   Aim for low stress - take time to unwind and care for your mental health   Aim for 150 min of moderate intensity exercise weekly for heart health, and weights twice weekly for bone health   Aim for 7-9 hours of sleep daily   When it comes to diets, agreement about the perfect plan isnt easy to find, even among the experts. Experts at the [facility] [employer] developed an idea known as the Healthy Eating Plate. Just imagine a plate divided into logical, healthy portions.   The emphasis is on diet quality:   Load up on vegetables and fruits - one-half of your plate: Aim for color and variety, and remember that potatoes dont count.   Go for whole grains - one-quarter of your plate: Whole wheat, barley, wheat berries, quinoa, oats, brown rice,  and foods made with them. If you want pasta, go with whole wheat pasta.   Protein power - one-quarter of your plate: Fish, chicken, beans, and nuts are all healthy, versatile protein sources. Limit red meat.   The diet, however, does go beyond the plate, offering a few other suggestions.   Use healthy  plant oils, such as olive, canola, soy, corn, sunflower and peanut. Check the labels, and avoid partially hydrogenated oil, which have unhealthy trans fats.   If youre thirsty, drink water. Coffee and tea are good in moderation, but skip sugary drinks and limit milk and dairy products to one or two daily servings.   The type of carbohydrate in the diet is more important than the amount. Some sources of carbohydrates, such as vegetables, fruits, whole grains, and beans-are healthier than others.   Finally, stay active  Signed, Thomasene Ripple, DO  08/04/2020 1:27 PM    [facility]

## 2020-08-04 NOTE — Patient Instructions (Signed)
Medication Instructions:  Your physician has recommended you make the following change in your medication: INCREASE:  Amlodopine 5 mg daily *If you need a refill on your cardiac medications before your next appointment, please call your pharmacy*   Lab Work: None If you have labs (blood work) drawn today and your tests are completely normal, you will receive your results only by: [name] MyChart Message (if you have MyChart) OR . A paper copy in the mail If you have any lab test that is abnormal or we need to change your treatment, we will call you to review the results.   Testing/Procedures: None   Follow-Up: At [facility], you and your health needs are our priority.  As part of our continuing mission to provide you with exceptional heart care, we have created designated Provider Care Teams.  These Care Teams include your primary Cardiologist (physician) and Advanced Practice Providers (APPs -  Physician Assistants and Nurse Practitioners) who all work together to provide you with the care you need, when you need it.  We recommend signing up for the patient portal called "MyChart".  Sign up information is provided on this After Visit Summary.  MyChart is used to connect with patients for Virtual Visits (Telemedicine).  Patients are able to view lab/test results, encounter notes, upcoming appointments, etc.  Non-urgent messages can be sent to your provider as well.   To learn more about what you can do with MyChart, go to [email or web address].    Your next appointment:   6 week(s)  The format for your next appointment:   In Person  Provider:   Thomasene Ripple, DO   Other Instructions

## 2020-08-09 DIAGNOSIS — H25811 Combined forms of age-related cataract, right eye: Secondary | ICD-10-CM | POA: Diagnosis not present

## 2020-08-09 DIAGNOSIS — H25813 Combined forms of age-related cataract, bilateral: Secondary | ICD-10-CM | POA: Diagnosis not present

## 2020-08-09 DIAGNOSIS — Z01818 Encounter for other preprocedural examination: Secondary | ICD-10-CM | POA: Diagnosis not present

## 2020-08-09 DIAGNOSIS — E119 Type 2 diabetes mellitus without complications: Secondary | ICD-10-CM | POA: Diagnosis not present

## 2020-08-09 DIAGNOSIS — H2511 Age-related nuclear cataract, right eye: Secondary | ICD-10-CM | POA: Diagnosis not present

## 2020-08-25 ENCOUNTER — Other Ambulatory Visit: Payer: Self-pay | Admitting: Legal Medicine

## 2020-08-25 DIAGNOSIS — G894 Chronic pain syndrome: Secondary | ICD-10-CM | POA: Diagnosis not present

## 2020-08-25 DIAGNOSIS — M5137 Other intervertebral disc degeneration, lumbosacral region: Secondary | ICD-10-CM | POA: Diagnosis not present

## 2020-08-25 DIAGNOSIS — M549 Dorsalgia, unspecified: Secondary | ICD-10-CM | POA: Diagnosis not present

## 2020-08-25 DIAGNOSIS — Z1389 Encounter for screening for other disorder: Secondary | ICD-10-CM | POA: Diagnosis not present

## 2020-08-25 DIAGNOSIS — M5136 Other intervertebral disc degeneration, lumbar region: Secondary | ICD-10-CM | POA: Diagnosis not present

## 2020-08-25 DIAGNOSIS — M48061 Spinal stenosis, lumbar region without neurogenic claudication: Secondary | ICD-10-CM | POA: Diagnosis not present

## 2020-08-25 DIAGNOSIS — M47816 Spondylosis without myelopathy or radiculopathy, lumbar region: Secondary | ICD-10-CM | POA: Diagnosis not present

## 2020-08-30 ENCOUNTER — Other Ambulatory Visit: Payer: Self-pay | Admitting: Legal Medicine

## 2020-09-08 DIAGNOSIS — H2512 Age-related nuclear cataract, left eye: Secondary | ICD-10-CM | POA: Diagnosis not present

## 2020-09-08 DIAGNOSIS — H25812 Combined forms of age-related cataract, left eye: Secondary | ICD-10-CM | POA: Diagnosis not present

## 2020-09-14 ENCOUNTER — Telehealth: Payer: Self-pay | Admitting: Legal Medicine

## 2020-09-14 NOTE — Progress Notes (Signed)
  Chronic Care Management   Outreach Note  09/14/2020 Name: ADRIELL POLANSKY MRN: [id number] DOB: 1955-06-18  Referred by: Abigail Miyamoto, MD Reason for referral : No chief complaint on file.   An unsuccessful telephone outreach was attempted today. The patient was referred to the pharmacist for assistance with care management and care coordination.   Follow Up Plan:   Carley Perdue [employer] Scheduler

## 2020-09-15 ENCOUNTER — Ambulatory Visit: Payer: PPO | Admitting: Cardiology

## 2020-09-19 ENCOUNTER — Other Ambulatory Visit: Payer: Self-pay | Admitting: Cardiology

## 2020-09-19 DIAGNOSIS — E1169 Type 2 diabetes mellitus with other specified complication: Secondary | ICD-10-CM

## 2020-09-22 DIAGNOSIS — G894 Chronic pain syndrome: Secondary | ICD-10-CM | POA: Diagnosis not present

## 2020-09-22 DIAGNOSIS — M48061 Spinal stenosis, lumbar region without neurogenic claudication: Secondary | ICD-10-CM | POA: Diagnosis not present

## 2020-09-22 DIAGNOSIS — M5136 Other intervertebral disc degeneration, lumbar region: Secondary | ICD-10-CM | POA: Diagnosis not present

## 2020-09-22 DIAGNOSIS — M5137 Other intervertebral disc degeneration, lumbosacral region: Secondary | ICD-10-CM | POA: Diagnosis not present

## 2020-09-22 DIAGNOSIS — M549 Dorsalgia, unspecified: Secondary | ICD-10-CM | POA: Diagnosis not present

## 2020-09-22 DIAGNOSIS — Z1389 Encounter for screening for other disorder: Secondary | ICD-10-CM | POA: Diagnosis not present

## 2020-09-22 DIAGNOSIS — M47816 Spondylosis without myelopathy or radiculopathy, lumbar region: Secondary | ICD-10-CM | POA: Diagnosis not present

## 2020-09-28 ENCOUNTER — Ambulatory Visit (INDEPENDENT_AMBULATORY_CARE_PROVIDER_SITE_OTHER): Payer: PPO

## 2020-09-28 DIAGNOSIS — I4430 Unspecified atrioventricular block: Secondary | ICD-10-CM | POA: Diagnosis not present

## 2020-09-29 LAB — CUP PACEART REMOTE DEVICE CHECK
Battery Impedance: 3915 Ohm
Battery Remaining Longevity: 19 mo
Battery Voltage: 2.71 V
Brady Statistic AP VP Percent: 1 %
Brady Statistic AP VS Percent: 0 %
Brady Statistic AS VP Percent: 99 %
Brady Statistic AS VS Percent: 0 %
Date Time Interrogation Session: 20220519163524
Implantable Lead Implant Date: 20091120
Implantable Lead Implant Date: 20091120
Implantable Lead Location: 753859
Implantable Lead Location: 753860
Implantable Lead Model: 5076
Implantable Lead Model: 5076
Implantable Pulse Generator Implant Date: 20091120
Lead Channel Impedance Value: 536 Ohm
Lead Channel Impedance Value: 926 Ohm
Lead Channel Pacing Threshold Amplitude: 0.625 V
Lead Channel Pacing Threshold Amplitude: 0.75 V
Lead Channel Pacing Threshold Pulse Width: 0.4 ms
Lead Channel Pacing Threshold Pulse Width: 0.4 ms
Lead Channel Setting Pacing Amplitude: 1.5 V
Lead Channel Setting Pacing Amplitude: 2 V
Lead Channel Setting Pacing Pulse Width: 0.4 ms
Lead Channel Setting Sensing Sensitivity: 5.6 mV

## 2020-10-06 ENCOUNTER — Other Ambulatory Visit: Payer: Self-pay

## 2020-10-06 ENCOUNTER — Encounter: Payer: Self-pay | Admitting: Cardiology

## 2020-10-06 ENCOUNTER — Ambulatory Visit: Payer: PPO | Admitting: Cardiology

## 2020-10-06 VITALS — BP 160/70 | HR 90 | Ht 70.0 in | Wt 147.6 lb

## 2020-10-06 DIAGNOSIS — I10 Essential (primary) hypertension: Secondary | ICD-10-CM

## 2020-10-06 DIAGNOSIS — Z950 Presence of cardiac pacemaker: Secondary | ICD-10-CM | POA: Diagnosis not present

## 2020-10-06 DIAGNOSIS — E782 Mixed hyperlipidemia: Secondary | ICD-10-CM | POA: Diagnosis not present

## 2020-10-06 DIAGNOSIS — Z720 Tobacco use: Secondary | ICD-10-CM

## 2020-10-06 MED ORDER — ATORVASTATIN CALCIUM 20 MG PO TABS: 20.0000 mg | ORAL_TABLET | Freq: Every day | ORAL | 3 refills | Status: DC

## 2020-10-06 MED ORDER — HYDROCHLOROTHIAZIDE 12.5 MG PO CAPS: 12.5000 mg | ORAL_CAPSULE | Freq: Every day | ORAL | 3 refills | Status: DC

## 2020-10-06 NOTE — Progress Notes (Signed)
Cardiology Office Note:    Date:  10/06/2020   ID:  Andrew Wiggins, DOB [birth year], MRN [id number]  PCP:  Abigail Miyamoto, MD  Cardiologist:  Thomasene Ripple, DO  Electrophysiologist:  Will Jorja Loa, MD   Referring MD: Abigail Miyamoto,*   " I am doing better"  History of Present Illness:    Andrew Wiggins is a 65 y.o. male with a hx of of complete heart block status post pacemaker, hypertension (will also concern for whitecoat hypertension as noted in his chart), COPD.  I saw the patient in February 04, 2020 at that time he appeared to be doing well from a cardiovascular standpoint.  He was finally orthopedic surgery.  At that time he was cleared for surgery.  At his visit on August 04, 2020 he was hypertensive I increase his amlodipine to 5 mg daily.  He continue his lisinopril 20 mg twice a day.  He was planning for cataract surgery and we cleared the patient.   He is here today for follow-up visit.  He tells me since I last saw him he has had his cataract surgery he is very happy with his recovery.  He has started a nice garden where she is eating fresh vegetables.  No other complaints at this time.   Past Medical History:  Diagnosis Date  . Allergic rhinitis 09/04/2012  . AV block    history of  . Cardiac conduction disorder 11/26/2008    Hx of AV BLOCK (ICD-426.9) & CARDIAC PACEMAKER IN SITU (ICD-V45.01) - he was adm 1/10 w/ symptomatic bradycardias & 2:1 heart block requiring pacemaker- followed by DrTaylor... ~  Jan11:  Last seen by Cards & pacer reprogrammed...    . Cardiac pacemaker in situ   . Carotid artery disease (HCC) 07/27/2010   R/O PERIPHERAL VASCULAR DISEASE (ICD-443.9) - on ASA 81mg /d... exam 11/09 shows gr 1-2 sys murmur LSB, but also has prominent bruit to base of right side of his neck w/ right > left Carotid Bruit... ~  CDopplers 11/09 showed severe plaque right ICA, & min plaque on left... no signif ICA stenoses per report. ~  f/u CDopplers 12/10  showed mild irreg plaque bilat in bulbs & intimal thickening in righ  . COPD (chronic obstructive pulmonary disease) with chronic bronchitis (HCC) 03/26/2008   CHRONIC OBSTRUCTIVE PULMONARY DISEASE (ICD-496) - hx recurrent bronchitic episodes in the past... smokes 1/2 - 1ppd x yrs... ~  PFT's 11/08 showed FVC= 3.85 (79%), FEV1= 2.19 (55%), %1sec= 57, mid-flows= 32% predicted:  all c/w mod airflow obstruction...  ~  CXR 12/10 showed left subclav pacer, mild hyperinflation, NAD...    . Dermatitis, contact    due to poison ivy  . Diabetes mellitus with neuropathy (HCC) 12/20/2016  . Dyslipidemia 03/06/2012  . Essential hypertension 04/10/2007    HYPERTENSION (ICD-401.9) - prev on Micardis 80mg /d then switched to Losartan 100mg /d, but he lost his job in 2011& couldn't afford Rx;  He called 14/07/2006 2/12 w/ this news & we phoned in Lisinopril 20mg /d... ~  2DEcho 11/09 showed sl incr LVwall thickness, norm LVF w/ EF= 55-60%, no regional wall motion abn, mild DD, mild MR... ~  Myoview 12/09 showed LBBB, no perfusion defects, abn septal motion & EF  . GERD 03/26/2008    GERD (ICD-530.81) - prev mild GERD symptoms that improved after cholecystectomy on 2000... uses OTC PPI as needed... ~  he is due for routine screening colonoscopy...      . GERD (gastroesophageal  reflux disease)   . Headache(784.0)   . Hypertension   . Mixed hyperlipidemia 06/19/2019  . Peripheral vascular disease (HCC)   . Sciatica 11/18/2019  . Tobacco use disorder     Past Surgical History:  Procedure Laterality Date  . laproscopic cholecystectomy  2000   by Dr. Odie Sera  . PACEMAKER INSERTION  03/2008   by Dr. Ladona Ridgel    Current Medications: Current Meds  Medication Sig  . amLODipine (NORVASC) 5 MG tablet Take 1 tablet (5 mg total) by mouth daily.  [name] aspirin 81 MG tablet Take 81 mg by mouth daily.  . Aspirin-Acetaminophen-Caffeine (GOODY HEADACHE PO) Take 1 packet by mouth daily as needed for headaches  . atorvastatin (LIPITOR)  20 MG tablet Take 1 tablet (20 mg total) by mouth daily.  . diclofenac (VOLTAREN) 75 MG EC tablet Take 75 mg by mouth 2 (two) times daily.  [name] gabapentin (NEURONTIN) 300 MG capsule Take 300 mg by mouth 3 (three) times daily.  [name] glimepiride (AMARYL) 4 MG tablet Take 1 tablet by mouth once daily  . hydrochlorothiazide (MICROZIDE) 12.5 MG capsule Take 1 capsule (12.5 mg total) by mouth daily.  [name] HYDROcodone-acetaminophen (NORCO) 10-325 MG tablet Take 1 tablet by mouth 3 (three) times daily.  [name] lisinopril (ZESTRIL) 20 MG tablet TAKE 1 TABLET BY MOUTH IN THE MORNING AND AT BEDTIME  . metFORMIN (GLUCOPHAGE-XR) 500 MG 24 hr tablet TAKE 1 TABLET BY MOUTH ONCE DAILY WITH EVENING MEAL     Allergies:   Patient has no known allergies.   Social History   Socioeconomic History  . Marital status: Legally Separated    Spouse name: Not on file  . Number of children: 2  . Years of education: Not on file  . Highest education level: Not on file  Occupational History  . Occupation: unemployed    Comment: works for himself  Tobacco Use  . Smoking status: Current Every Day Smoker    Packs/day: 0.50    Years: 25.00    Pack years: 12.50    Types: Cigarettes  . Smokeless tobacco: Never Used  Substance and Sexual Activity  . Alcohol use: Not Currently    Alcohol/week: 0.0 standard drinks  . Drug use: No  . Sexual activity: Not Currently  Other Topics Concern  . Not on file  Social History Narrative   2 siblings died with leukemia around age 34   Social Determinants of Health   Financial Resource Strain: Low Risk   . Difficulty of Paying Living Expenses: Not hard at all  Food Insecurity: No Food Insecurity  . Worried About Programme researcher, broadcasting/film/video in the Last Year: Never true  . Ran Out of Food in the Last Year: Never true  Transportation Needs: No Transportation Needs  . Lack of Transportation (Medical): No  . Lack of Transportation (Non-Medical): No  Physical Activity: Inactive  . Days of Exercise  per Week: 0 days  . Minutes of Exercise per Session: 30 min  Stress: Stress Concern Present  . Feeling of Stress : To some extent  Social Connections: Socially Isolated  . Frequency of Communication with Friends and Family: Three times a week  . Frequency of Social Gatherings with Friends and Family: Never  . Attends Religious Services: Never  . Active Member of Clubs or Organizations: No  . Attends Banker Meetings: Never  . Marital Status: Divorced     Family History: The patient's family history includes Diabetes in his brother.  ROS:  Review of Systems  Constitution: Negative for decreased appetite, fever and weight gain.  HENT: Negative for congestion, ear discharge, hoarse voice and sore throat.   Eyes: Negative for discharge, redness, vision loss in right eye and visual halos.  Cardiovascular: Negative for chest pain, dyspnea on exertion, leg swelling, orthopnea and palpitations.  Respiratory: Negative for cough, hemoptysis, shortness of breath and snoring.   Endocrine: Negative for heat intolerance and polyphagia.  Hematologic/Lymphatic: Negative for bleeding problem. Does not bruise/bleed easily.  Skin: Negative for flushing, nail changes, rash and suspicious lesions.  Musculoskeletal: Negative for arthritis, joint pain, muscle cramps, myalgias, neck pain and stiffness.  Gastrointestinal: Negative for abdominal pain, bowel incontinence, diarrhea and excessive appetite.  Genitourinary: Negative for decreased libido, genital sores and incomplete emptying.  Neurological: Negative for brief paralysis, focal weakness, headaches and loss of balance.  Psychiatric/Behavioral: Negative for altered mental status, depression and suicidal ideas.  Allergic/Immunologic: Negative for HIV exposure and persistent infections.    EKGs/Labs/Other Studies Reviewed:    The following studies were reviewed today:   EKG: None today    Recent Labs: 11/18/2019: ALT 14; BUN 12;  Creatinine, Ser 0.89; Hemoglobin 15.7; Platelets 267; Potassium 4.2; Sodium 142  Recent Lipid Panel    Component Value Date/Time   CHOL 150 11/18/2019 1023   TRIG 108 11/18/2019 1023   HDL 33 (L) 11/18/2019 1023   CHOLHDL 4.5 11/18/2019 1023   CHOLHDL 5 12/20/2017 1320   VLDL 44.0 (H) 12/20/2017 1320   LDLCALC 97 11/18/2019 1023   LDLDIRECT 113.0 12/20/2017 1320    Physical Exam:    VS:  BP (!) 160/70   Pulse 90   Ht 5\' 10"  (1.778 m)   Wt 147 lb 9.6 oz (67 kg)   SpO2 99%   BMI 21.18 kg/m     Wt Readings from Last 3 Encounters:  10/06/20 147 lb 9.6 oz (67 kg)  08/04/20 149 lb 6.4 oz (67.8 kg)  06/16/20 151 lb (68.5 kg)     GEN: Well nourished, well developed in no acute distress HEENT: Normal NECK: No JVD; No carotid bruits LYMPHATICS: No lymphadenopathy CARDIAC: S1S2 noted,RRR, no murmurs, rubs, gallops RESPIRATORY:  Clear to auscultation without rales, wheezing or rhonchi  ABDOMEN: Soft, non-tender, non-distended, +bowel sounds, no guarding. EXTREMITIES: No edema, No cyanosis, no clubbing MUSCULOSKELETAL:  No deformity  SKIN: Warm and dry NEUROLOGIC:  Alert and oriented x 3, non-focal PSYCHIATRIC:  Normal affect, good insight  ASSESSMENT:    1. Essential hypertension   2. Mixed hyperlipidemia   3. Tobacco use   4. Cardiac pacemaker in situ    PLAN:     His blood pressure was manually taken by me 160/70 mmHg.  Admits to taking his amlodipine 5 mg daily along with his lisinopril 20 mg twice a day.  His goal should be less than 130/80.  We will start the patient on low-dose diuretic hydrochlorothiazide 12.5 hopefully this will help with keeping his blood pressure at target.  Smoking cessation advised  He also is diabetic he has not been on a statin, will start patient on moderate intensity statin Lipitor 20 mg daily.  The patient is in agreement with the above plan. The patient left the office in stable condition.  The patient will follow up in 4 weeks or  sooner if needed.   Medication Adjustments/Labs and Tests Ordered: Current medicines are reviewed at length with the patient today.  Concerns regarding medicines are outlined above.  No orders of the  defined types were placed in this encounter.  Meds ordered this encounter  Medications  . hydrochlorothiazide (MICROZIDE) 12.5 MG capsule    Sig: Take 1 capsule (12.5 mg total) by mouth daily.    Dispense:  90 capsule    Refill:  3  . atorvastatin (LIPITOR) 20 MG tablet    Sig: Take 1 tablet (20 mg total) by mouth daily.    Dispense:  90 tablet    Refill:  3    Patient Instructions  Medication Instructions:  Your physician has recommended you make the following change in your medication: START: Hydrochlorothiazide 12.5 mg once daily START:P Lipitor 20 mg once daily *If you need a refill on your cardiac medications before your next appointment, please call your pharmacy*   Lab Work: None If you have labs (blood work) drawn today and your tests are completely normal, you will receive your results only by: [name] MyChart Message (if you have MyChart) OR . A paper copy in the mail If you have any lab test that is abnormal or we need to change your treatment, we will call you to review the results.   Testing/Procedures: None   Follow-Up: At [facility], you and your health needs are our priority.  As part of our continuing mission to provide you with exceptional heart care, we have created designated Provider Care Teams.  These Care Teams include your primary Cardiologist (physician) and Advanced Practice Providers (APPs -  Physician Assistants and Nurse Practitioners) who all work together to provide you with the care you need, when you need it.  We recommend signing up for the patient portal called "MyChart".  Sign up information is provided on this After Visit Summary.  MyChart is used to connect with patients for Virtual Visits (Telemedicine).  Patients are able to view lab/test  results, encounter notes, upcoming appointments, etc.  Non-urgent messages can be sent to your provider as well.   To learn more about what you can do with MyChart, go to [email or web address].    Your next appointment:   3 month(s)  The format for your next appointment:   In Person  Provider:   Thomasene Ripple, DO   Other Instructions Please take your blood pressure daily.      Adopting a Healthy Lifestyle.  Know what a healthy weight is for you (roughly BMI <25) and aim to maintain this   Aim for 7+ servings of fruits and vegetables daily   65-80+ fluid ounces of water or unsweet tea for healthy kidneys   Limit to max 1 drink of alcohol per day; avoid smoking/tobacco   Limit animal fats in diet for cholesterol and heart health - choose grass fed whenever available   Avoid highly processed foods, and foods high in saturated/trans fats   Aim for low stress - take time to unwind and care for your mental health   Aim for 150 min of moderate intensity exercise weekly for heart health, and weights twice weekly for bone health   Aim for 7-9 hours of sleep daily   When it comes to diets, agreement about the perfect plan isnt easy to find, even among the experts. Experts at the [facility] [employer] developed an idea known as the Healthy Eating Plate. Just imagine a plate divided into logical, healthy portions.   The emphasis is on diet quality:   Load up on vegetables and fruits - one-half of your plate: Aim for color and variety, and remember that potatoes dont  count.   Go for whole grains - one-quarter of your plate: Whole wheat, barley, wheat berries, quinoa, oats, brown rice, and foods made with them. If you want pasta, go with whole wheat pasta.   Protein power - one-quarter of your plate: Fish, chicken, beans, and nuts are all healthy, versatile protein sources. Limit red meat.   The diet, however, does go beyond the plate, offering a few other  suggestions.   Use healthy plant oils, such as olive, canola, soy, corn, sunflower and peanut. Check the labels, and avoid partially hydrogenated oil, which have unhealthy trans fats.   If youre thirsty, drink water. Coffee and tea are good in moderation, but skip sugary drinks and limit milk and dairy products to one or two daily servings.   The type of carbohydrate in the diet is more important than the amount. Some sources of carbohydrates, such as vegetables, fruits, whole grains, and beans-are healthier than others.   Finally, stay active  Signed, Thomasene RippleKardie Shenica Holzheimer, DO  10/06/2020 4:54 PM     [facility]

## 2020-10-06 NOTE — Patient Instructions (Addendum)
Medication Instructions:  Your physician has recommended you make the following change in your medication: START: Hydrochlorothiazide 12.5 mg once daily START:P Lipitor 20 mg once daily *If you need a refill on your cardiac medications before your next appointment, please call your pharmacy*   Lab Work: None If you have labs (blood work) drawn today and your tests are completely normal, you will receive your results only by: [name] MyChart Message (if you have MyChart) OR . A paper copy in the mail If you have any lab test that is abnormal or we need to change your treatment, we will call you to review the results.   Testing/Procedures: None   Follow-Up: At [facility], you and your health needs are our priority.  As part of our continuing mission to provide you with exceptional heart care, we have created designated Provider Care Teams.  These Care Teams include your primary Cardiologist (physician) and Advanced Practice Providers (APPs -  Physician Assistants and Nurse Practitioners) who all work together to provide you with the care you need, when you need it.  We recommend signing up for the patient portal called "MyChart".  Sign up information is provided on this After Visit Summary.  MyChart is used to connect with patients for Virtual Visits (Telemedicine).  Patients are able to view lab/test results, encounter notes, upcoming appointments, etc.  Non-urgent messages can be sent to your provider as well.   To learn more about what you can do with MyChart, go to [email or web address].    Your next appointment:   3 month(s)  The format for your next appointment:   In Person  Provider:   Thomasene Ripple, DO   Other Instructions Please take your blood pressure daily.

## 2020-10-20 DIAGNOSIS — Z1389 Encounter for screening for other disorder: Secondary | ICD-10-CM | POA: Diagnosis not present

## 2020-10-20 DIAGNOSIS — M48061 Spinal stenosis, lumbar region without neurogenic claudication: Secondary | ICD-10-CM | POA: Diagnosis not present

## 2020-10-20 DIAGNOSIS — M549 Dorsalgia, unspecified: Secondary | ICD-10-CM | POA: Diagnosis not present

## 2020-10-20 DIAGNOSIS — M5137 Other intervertebral disc degeneration, lumbosacral region: Secondary | ICD-10-CM | POA: Diagnosis not present

## 2020-10-20 DIAGNOSIS — G894 Chronic pain syndrome: Secondary | ICD-10-CM | POA: Diagnosis not present

## 2020-10-20 DIAGNOSIS — M5136 Other intervertebral disc degeneration, lumbar region: Secondary | ICD-10-CM | POA: Diagnosis not present

## 2020-10-20 DIAGNOSIS — M47816 Spondylosis without myelopathy or radiculopathy, lumbar region: Secondary | ICD-10-CM | POA: Diagnosis not present

## 2020-10-20 NOTE — Progress Notes (Signed)
Remote pacemaker transmission.   

## 2020-11-17 DIAGNOSIS — M5136 Other intervertebral disc degeneration, lumbar region: Secondary | ICD-10-CM | POA: Diagnosis not present

## 2020-11-17 DIAGNOSIS — G894 Chronic pain syndrome: Secondary | ICD-10-CM | POA: Diagnosis not present

## 2020-11-17 DIAGNOSIS — M47816 Spondylosis without myelopathy or radiculopathy, lumbar region: Secondary | ICD-10-CM | POA: Diagnosis not present

## 2020-11-17 DIAGNOSIS — M549 Dorsalgia, unspecified: Secondary | ICD-10-CM | POA: Diagnosis not present

## 2020-11-17 DIAGNOSIS — Z1389 Encounter for screening for other disorder: Secondary | ICD-10-CM | POA: Diagnosis not present

## 2020-11-17 DIAGNOSIS — M5137 Other intervertebral disc degeneration, lumbosacral region: Secondary | ICD-10-CM | POA: Diagnosis not present

## 2020-11-17 DIAGNOSIS — M48061 Spinal stenosis, lumbar region without neurogenic claudication: Secondary | ICD-10-CM | POA: Diagnosis not present

## 2020-12-03 ENCOUNTER — Encounter: Payer: Self-pay | Admitting: Legal Medicine

## 2020-12-03 ENCOUNTER — Other Ambulatory Visit: Payer: Self-pay

## 2020-12-03 ENCOUNTER — Ambulatory Visit (INDEPENDENT_AMBULATORY_CARE_PROVIDER_SITE_OTHER): Payer: PPO | Admitting: Legal Medicine

## 2020-12-03 VITALS — BP 160/60 | HR 111 | Temp 96.6°F | Resp 16 | Ht 70.0 in | Wt 144.0 lb

## 2020-12-03 DIAGNOSIS — I739 Peripheral vascular disease, unspecified: Secondary | ICD-10-CM

## 2020-12-03 NOTE — Progress Notes (Signed)
Established Patient Office Visit  Subjective:  Patient ID: Andrew Wiggins, male    DOB: November 06, 1955  Age: 65 y.o. MRN: [id number]  CC:  Chief Complaint  Patient presents with   Numbness    Left Leg numbness and cold since one week.    HPI [name] presents for chronic visit.  Pain left leg worse at night.  For one week.  Claudication when walking.  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 ~  Jan11:  Last seen by Cards & pacer reprogrammed...     Cardiac pacemaker in situ    Carotid artery disease (HCC) 07/27/2010   R/O PERIPHERAL VASCULAR DISEASE (ICD-443.9) - on ASA 81mg /d... exam 11/09 shows gr 1-2 sys murmur LSB, but also has prominent bruit to base of right side of his neck w/ right > left Carotid Bruit... ~  CDopplers 11/09 showed severe plaque right ICA, & min plaque on left... no signif ICA stenoses per report. 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 smokes 1/2 - 1ppd x yrs... ~  PFT's 11/08 showed FVC= 3.85 (79%), FEV1= 2.19 (55%), %1sec= 57, mid-flows= 32% predicted:  all c/w mod airflow obstruction...  ~  CXR 12/10 showed left subclav pacer, mild hyperinflation, NAD...     Dermatitis, contact    due to poison ivy   Diabetes mellitus with neuropathy (HCC) 12/20/2016   Dyslipidemia 03/06/2012   Essential hypertension 04/10/2007    HYPERTENSION (ICD-401.9) - prev on Micardis 80mg /d then switched to Losartan 100mg /d, but he lost his job in 2011&  couldn't afford Rx;  He called us 2/12 w/ this news & we phoned in Lisinopril 20mg /d... ~  2DEcho 11/09 showed sl incr LVwall thickness, norm LVF w/ EF= 55-60%, no regional wall motion abn, mild DD, mild MR... ~  Myoview 12/09 showed LBBB, no perfusion defects, abn septal motion & EF   GERD 03/26/2008    GERD (ICD-530.81) - prev mild GERD symptoms that improved after cholecystectomy on 2000... uses OTC PPI as needed... ~  he is due for routine screening colonoscopy...       GERD (gastroesophageal reflux disease)    Headache(784.0)    Hypertension    Mixed hyperlipidemia 06/19/2019   Peripheral vascular disease (HCC)    Sciatica 11/18/2019   Tobacco use disorder     Past Surgical History:  Procedure Laterality Date   laproscopic cholecystectomy  2000   by Dr. Odie SeraHoxsworth   PACEMAKER INSERTION  03/2008   by Dr. Ladona Ridgelaylor    Family History  Problem Relation Age of Onset   Diabetes Brother     Social History   Socioeconomic History   Marital status: Legally Separated    Spouse name: Not on file   Number of children: 2   Years of education: Not on file   Highest education level: Not on file  Occupational History   Occupation: unemployed    Comment: works for himself  Tobacco Use   Smoking status:  Every Day    Packs/day: 1.00    Years: 25.00    Pack years: 25.00    Types: Cigarettes   Smokeless tobacco: Never  Substance and Sexual Activity   Alcohol use: Not Currently    Alcohol/week: 0.0 standard drinks   Drug use: No   Sexual activity: Not Currently  Other Topics Concern   Not on file  Social History Narrative   2 siblings died with leukemia around age 63   Social Determinants of Health   Financial Resource Strain: Low Risk    Difficulty of Paying Living Expenses: Not hard at all  Food Insecurity: No Food Insecurity   Worried About Programme researcher, broadcasting/film/video in the Last Year: Never true   Barista in the Last Year: Never true  Transportation Needs: No Transportation  Needs   Lack of Transportation (Medical): No   Lack of Transportation (Non-Medical): No  Physical Activity: Inactive   Days of Exercise per Week: 0 days   Minutes of Exercise per Session: 30 min  Stress: Stress Concern Present   Feeling of Stress : To some extent  Social Connections: Socially Isolated   Frequency of Communication with Friends and Family: Three times a week   Frequency of Social Gatherings with Friends and Family: Never   Attends Religious Services: Never   Database administrator or Organizations: No   Attends Engineer, structural: Never   Marital Status: Divorced  Catering manager Violence: Not At Risk   Fear of Current or Ex-Partner: No   Emotionally Abused: No   Physically Abused: No   Sexually Abused: No    Outpatient Medications Prior to Visit  Medication Sig Dispense Refill   Aspirin-Acetaminophen-Caffeine (GOODY HEADACHE PO) Take 1 packet by mouth daily as needed for headaches     atorvastatin (LIPITOR) 20 MG tablet Take 1 tablet (20 mg total) by mouth daily. 90 tablet 3   gabapentin (NEURONTIN) 300 MG capsule Take 300 mg by mouth 3 (three) times daily.     glimepiride (AMARYL) 4 MG tablet Take 1 tablet by mouth once daily 90 tablet 2   hydrochlorothiazide (MICROZIDE) 12.5 MG capsule Take 1 capsule (12.5 mg total) by mouth daily. 90 capsule 3   HYDROcodone-acetaminophen (NORCO) 10-325 MG tablet Take 1 tablet by mouth 3 (three) times daily.     lisinopril (ZESTRIL) 20 MG tablet TAKE 1 TABLET BY MOUTH IN THE MORNING AND AT BEDTIME 180 tablet 0   metFORMIN (GLUCOPHAGE-XR) 500 MG 24 hr tablet TAKE 1 TABLET BY MOUTH ONCE DAILY WITH EVENING MEAL 90 tablet 2   diclofenac (VOLTAREN) 75 MG EC tablet Take 75 mg by mouth 2 (two) times daily.     amLODipine (NORVASC) 5 MG tablet Take 1 tablet (5 mg total) by mouth daily. 180 tablet 3   aspirin 81 MG tablet Take 81 mg by mouth daily.     No facility-administered medications prior to visit.    No Known  Allergies  ROS Review of Systems  Constitutional:  Negative for chills, fatigue and fever.  HENT:  Negative for congestion, ear pain and sore throat.   Respiratory:  Negative for cough and shortness of breath.   Cardiovascular:  Negative for chest pain.  Gastrointestinal:  Negative for abdominal pain, constipation, diarrhea, nausea and vomiting.  Endocrine: Negative for polydipsia, polyphagia and polyuria.  Genitourinary:  Negative for dysuria and frequency.  Musculoskeletal:  Negative for arthralgias and myalgias.  Neurological:  Negative for dizziness and  headaches.  Psychiatric/Behavioral:  Negative for dysphoric mood.        No dysphoria     Objective:    Physical Exam Vitals reviewed.  Constitutional:      Appearance: Normal appearance.  Cardiovascular:     Rate and Rhythm: Normal rate and regular rhythm.     Pulses: Decreased pulses.          Carotid pulses are 2+ on the right side.      Radial pulses are 2+ on the right side.       Femoral pulses are 2+ on the right side and 1+ on the left side.      Popliteal pulses are 2+ on the right side and 1+ on the left side.       Dorsalis pedis pulses are 0 on the right side and 0 on the left side.       Posterior tibial pulses are 0 on the right side and 0 on the left side.     Heart sounds: Normal heart sounds. No murmur heard.   No gallop.  Pulmonary:     Effort: Pulmonary effort is normal. No respiratory distress.     Breath sounds: Normal breath sounds. No wheezing.  Musculoskeletal:     Cervical back: Normal range of motion and neck supple.     Right lower leg: No edema.     Left lower leg: No edema.  Neurological:     Mental Status: He is alert.    BP (!) 160/60   Pulse (!) 111   Temp (!) 96.6 F (35.9 C)   Resp 16   Ht 5\' 10"  (1.778 m)   Wt 144 lb (65.3 kg)   SpO2 98%   BMI 20.66 kg/m  Wt Readings from Last 3 Encounters:  12/03/20 144 lb (65.3 kg)  10/06/20 147 lb 9.6 oz (67 kg)  08/04/20 149 lb 6.4 oz  (67.8 kg)     Health Maintenance Due  Topic Date Due   COVID-19 Vaccine (1) Never done   HIV Screening  Never done   Hepatitis C Screening  Never done   TETANUS/TDAP  Never done   COLONOSCOPY (Pts 45-59yrs Insurance coverage will need to be confirmed)  Never done   Zoster Vaccines- Shingrix (1 of 2) Never done   PNA vac Low Risk Adult (1 of 2 - PCV13) 12/21/2019   HEMOGLOBIN A1C  05/20/2020   FOOT EXAM  11/17/2020    There are no preventive care reminders to display for this patient.  Lab Results  Component Value Date   TSH 1.46 12/20/2017   Lab Results  Component Value Date   WBC 10.4 11/18/2019   HGB 15.7 11/18/2019   HCT 46.1 11/18/2019   MCV 91 11/18/2019   PLT 267 11/18/2019   Lab Results  Component Value Date   NA 142 11/18/2019   K 4.2 11/18/2019   CO2 23 11/18/2019   GLUCOSE 139 (H) 11/18/2019   BUN 12 11/18/2019   CREATININE 0.89 11/18/2019   BILITOT 0.5 11/18/2019   ALKPHOS 98 11/18/2019   AST 14 11/18/2019   ALT 14 11/18/2019   PROT 6.8 11/18/2019   ALBUMIN 4.3 11/18/2019   CALCIUM 9.5 11/18/2019   GFR 83.08 12/20/2017   Lab Results  Component Value Date   CHOL 150 11/18/2019   Lab Results  Component Value Date   HDL 33 (L) 11/18/2019   Lab Results  Component Value Date   LDLCALC 97 11/18/2019  Lab Results  Component Value Date   TRIG 108 11/18/2019   Lab Results  Component Value Date   CHOLHDL 4.5 11/18/2019   Lab Results  Component Value Date   HGBA1C 7.3 (H) 11/18/2019      Assessment & Plan:   Problem List Items Addressed This Visit       Cardiovascular and Mediastinum   PVD (peripheral vascular disease) (HCC) - Primary   Relevant Orders   Ambulatory referral to Vascular Surgery  Patient is having acute ischemia right leg with severe pain, needs vascular consult ASAP    Follow-up: No follow-ups on file.    Brent Bulla, MD

## 2020-12-07 ENCOUNTER — Other Ambulatory Visit: Payer: Self-pay

## 2020-12-07 DIAGNOSIS — I739 Peripheral vascular disease, unspecified: Secondary | ICD-10-CM

## 2020-12-10 ENCOUNTER — Ambulatory Visit (INDEPENDENT_AMBULATORY_CARE_PROVIDER_SITE_OTHER): Payer: PPO | Admitting: Vascular Surgery

## 2020-12-10 ENCOUNTER — Other Ambulatory Visit: Payer: Self-pay

## 2020-12-10 ENCOUNTER — Ambulatory Visit (HOSPITAL_COMMUNITY)
Admission: RE | Admit: 2020-12-10 | Discharge: 2020-12-10 | Disposition: A | Discharge status: Home / Self Care | Payer: PPO | Source: Ambulatory Visit | Attending: Vascular Surgery | Admitting: Vascular Surgery

## 2020-12-10 ENCOUNTER — Encounter: Payer: Self-pay | Admitting: Vascular Surgery

## 2020-12-10 VITALS — BP 171/82 | HR 110 | Temp 98.6°F | Resp 20 | Ht 70.0 in | Wt 142.0 lb

## 2020-12-10 DIAGNOSIS — I739 Peripheral vascular disease, unspecified: Secondary | ICD-10-CM | POA: Insufficient documentation

## 2020-12-10 NOTE — Progress Notes (Signed)
Patient ID: Andrew Wiggins, male   DOB: 1955/11/24, 65 y.o.   MRN: [id number]  Reason for Consult: New Patient (Initial Visit)   Referred by Abigail Miyamoto,*  Subjective:     HPI:  Andrew Wiggins is a 65 y.o. male with history of coronary and carotid artery disease.  He denies any previous history of peripheral vascular disease.  Recently he began having significant pain in his left lower extremity particularly at night.  He was told that he has sciatica he is on 10 mg of Norco for this but is no longer helping his foot.  He does in the past couple days have ulceration of his left small toe.  He has not had any fevers or chills.  He is walking but has had a limp in the past couple weeks.  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 ~  Jan11:  Last seen by Cards & pacer reprogrammed...     Cardiac pacemaker in situ    Carotid artery disease (HCC) 07/27/2010   R/O PERIPHERAL VASCULAR DISEASE (ICD-443.9) - on ASA 81mg /d... exam 11/09 shows gr 1-2 sys murmur LSB, but also has prominent bruit to base of right side of his neck w/ right > left Carotid Bruit... ~  CDopplers 11/09 showed severe plaque right ICA, & min plaque on left... no signif ICA stenoses per report. 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 smokes 1/2 - 1ppd x yrs... ~  PFT's 11/08 showed FVC= 3.85 (79%), FEV1= 2.19 (55%), %1sec= 57, mid-flows= 32% predicted:  all c/w mod airflow obstruction...  ~  CXR 12/10 showed left subclav  pacer, mild hyperinflation, NAD...     Dermatitis, contact    due to poison ivy   Diabetes mellitus with neuropathy (HCC) 12/20/2016   Dyslipidemia 03/06/2012   Essential hypertension 04/10/2007    HYPERTENSION (ICD-401.9) - prev on Micardis 80mg /d then switched to Losartan 100mg /d, but he lost his job in 2011& couldn't afford Rx;  He called 14/07/2006 2/12 w/ this news & we phoned in Lisinopril 20mg /d... ~  2DEcho 11/09 showed sl incr LVwall thickness, norm LVF w/ EF= 55-60%, no regional wall motion abn, mild DD, mild MR... ~  Myoview 12/09 showed LBBB, no perfusion defects, abn septal motion & EF   GERD 03/26/2008    GERD (ICD-530.81) - prev mild GERD symptoms that improved after cholecystectomy on 2000... uses OTC PPI as needed... ~  he is due for routine screening colonoscopy...       GERD (gastroesophageal reflux disease)    Headache(784.0)    Hypertension    Mixed hyperlipidemia 06/19/2019   Peripheral vascular disease (HCC)    Sciatica 11/18/2019   Tobacco use disorder    Family History  Problem Relation Age of Onset   Diabetes Brother    Past Surgical History:  Procedure Laterality Date   laproscopic cholecystectomy  2000   by Dr. 03/28/2008   PACEMAKER INSERTION  03/2008   by Dr. 08/17/2019  Short Social History:  Social History   Tobacco Use   Smoking status: Every Day    Packs/day: 1.00    Years: 25.00    Pack years: 25.00    Types: Cigarettes   Smokeless tobacco: Never  Substance Use Topics   Alcohol use: Not Currently    Alcohol/week: 0.0 standard drinks    No Known Allergies  Current Outpatient Medications  Medication Sig Dispense Refill   Aspirin-Acetaminophen-Caffeine (GOODY HEADACHE PO) Take 1 packet by mouth daily as needed for headaches     atorvastatin (LIPITOR) 20 MG tablet Take 1 tablet (20 mg total) by mouth daily. 90 tablet 3   gabapentin (NEURONTIN) 300 MG capsule Take 300 mg by mouth 3 (three) times daily.     glimepiride (AMARYL) 4 MG tablet Take 1  tablet by mouth once daily 90 tablet 2   hydrochlorothiazide (MICROZIDE) 12.5 MG capsule Take 1 capsule (12.5 mg total) by mouth daily. 90 capsule 3   HYDROcodone-acetaminophen (NORCO) 10-325 MG tablet Take 1 tablet by mouth 3 (three) times daily.     lisinopril (ZESTRIL) 20 MG tablet TAKE 1 TABLET BY MOUTH IN THE MORNING AND AT BEDTIME 180 tablet 0   metFORMIN (GLUCOPHAGE-XR) 500 MG 24 hr tablet TAKE 1 TABLET BY MOUTH ONCE DAILY WITH EVENING MEAL 90 tablet 2   amLODipine (NORVASC) 5 MG tablet Take 1 tablet (5 mg total) by mouth daily. 180 tablet 3   No current facility-administered medications for this visit.    Review of Systems  Constitutional:  Constitutional negative. HENT: HENT negative.  Eyes: Eyes negative.  Respiratory: Respiratory negative.  Cardiovascular: Cardiovascular negative.  GI: Gastrointestinal negative.  Musculoskeletal: Positive for leg pain.  Skin: Positive for wound.  Neurological: Neurological negative. Hematologic: Hematologic/lymphatic negative.  Psychiatric: Psychiatric negative.       Objective:  Objective  Vitals:   12/10/20 1234  BP: (!) 171/82  Pulse: (!) 110  Resp: 20  Temp: 98.6 F (37 C)  SpO2: 95%     Physical Exam HENT:     Head: Normocephalic.     Nose:     Comments: Wearing a mask Eyes:     Pupils: Pupils are equal, round, and reactive to light.  Cardiovascular:     Pulses:          Femoral pulses are 2+ on the right side and 2+ on the left side.      Popliteal pulses are 0 on the right side and 0 on the left side.     Comments: Both common femoral arteries are heavily calcified and palpable Pulmonary:     Effort: Pulmonary effort is normal.  Abdominal:     General: Abdomen is flat.     Palpations: Abdomen is soft. There is no mass.  Musculoskeletal:     Right lower leg: No edema.     Left lower leg: No edema.  Skin:    Comments: Superficial ulceration of left medial small toe  Neurological:     General: No focal  deficit present.     Mental Status: He is alert.  Psychiatric:        Mood and Affect: Mood normal.        Behavior: Behavior normal.        Thought Content: Thought content normal.        Judgment: Judgment normal.    Data: ABI Findings:  +---------+------------------+-----+--------+--------+  Right    Rt Pressure (mmHg)IndexWaveformComment   +---------+------------------+-----+--------+--------+  Brachial 170                                      +---------+------------------+-----+--------+--------+  PTA      141               0.83 biphasic          +---------+------------------+-----+--------+--------+  DP       131               0.77 biphasic          +---------+------------------+-----+--------+--------+  Great Toe112               0.66 Abnormal          +---------+------------------+-----+--------+--------+   +---------+------------------+-----+----------+-------+  Left     Lt Pressure (mmHg)IndexWaveform  Comment  +---------+------------------+-----+----------+-------+  Brachial 170                                       +---------+------------------+-----+----------+-------+  PTA      30                0.18 monophasic         +---------+------------------+-----+----------+-------+  DP       23                0.14 monophasic         +---------+------------------+-----+----------+-------+  Great Toe                       Absent             +---------+------------------+-----+----------+-------+   +-------+-----------+-----------+------------+------------+  ABI/TBIToday's ABIToday's TBIPrevious ABIPrevious TBI  +-------+-----------+-----------+------------+------------+  Right  0.83       0.66                                 +-------+-----------+-----------+------------+------------+  Left   0.18       0.00                                  +-------+-----------+-----------+------------+------------+           Assessment/Plan:     64-year-old male with severely depressed ABIs and absent great toe pressure on the left with symptoms lasting for approximately the past 2 weeks and new superficial ulceration of his left small toe.  I recommended he begin aspirin therapy he is already on a statin.  We will begin with angiography from a right common femoral approach early next week.  I discussed with the patient that I am not available but I did not think that we should delay for my return from vacation.  I discussed with the patient the possible outcomes including stenting, balloon angioplasty, and high likelihood to need surgical bypass given his severely depressed ABI.  All of his questions were answered to his satisfaction we will get him scheduled today.     Samah Lapiana Christopher Ashla Murph MD [facility]   

## 2020-12-10 NOTE — H&P (View-Only) (Signed)
Patient ID: Andrew Wiggins, male   DOB: 1955/11/24, 65 y.o.   MRN: [id number]  Reason for Consult: New Patient (Initial Visit)   Referred by Abigail Miyamoto,*  Subjective:     HPI:  Andrew Wiggins is a 65 y.o. male with history of coronary and carotid artery disease.  He denies any previous history of peripheral vascular disease.  Recently he began having significant pain in his left lower extremity particularly at night.  He was told that he has sciatica he is on 10 mg of Norco for this but is no longer helping his foot.  He does in the past couple days have ulceration of his left small toe.  He has not had any fevers or chills.  He is walking but has had a limp in the past couple weeks.  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 ~  Jan11:  Last seen by Cards & pacer reprogrammed...     Cardiac pacemaker in situ    Carotid artery disease (HCC) 07/27/2010   R/O PERIPHERAL VASCULAR DISEASE (ICD-443.9) - on ASA 81mg /d... exam 11/09 shows gr 1-2 sys murmur LSB, but also has prominent bruit to base of right side of his neck w/ right > left Carotid Bruit... ~  CDopplers 11/09 showed severe plaque right ICA, & min plaque on left... no signif ICA stenoses per report. 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 smokes 1/2 - 1ppd x yrs... ~  PFT's 11/08 showed FVC= 3.85 (79%), FEV1= 2.19 (55%), %1sec= 57, mid-flows= 32% predicted:  all c/w mod airflow obstruction...  ~  CXR 12/10 showed left subclav  pacer, mild hyperinflation, NAD...     Dermatitis, contact    due to poison ivy   Diabetes mellitus with neuropathy (HCC) 12/20/2016   Dyslipidemia 03/06/2012   Essential hypertension 04/10/2007    HYPERTENSION (ICD-401.9) - prev on Micardis 80mg /d then switched to Losartan 100mg /d, but he lost his job in 2011& couldn't afford Rx;  He called 14/07/2006 2/12 w/ this news & we phoned in Lisinopril 20mg /d... ~  2DEcho 11/09 showed sl incr LVwall thickness, norm LVF w/ EF= 55-60%, no regional wall motion abn, mild DD, mild MR... ~  Myoview 12/09 showed LBBB, no perfusion defects, abn septal motion & EF   GERD 03/26/2008    GERD (ICD-530.81) - prev mild GERD symptoms that improved after cholecystectomy on 2000... uses OTC PPI as needed... ~  he is due for routine screening colonoscopy...       GERD (gastroesophageal reflux disease)    Headache(784.0)    Hypertension    Mixed hyperlipidemia 06/19/2019   Peripheral vascular disease (HCC)    Sciatica 11/18/2019   Tobacco use disorder    Family History  Problem Relation Age of Onset   Diabetes Brother    Past Surgical History:  Procedure Laterality Date   laproscopic cholecystectomy  2000   by Dr. 03/28/2008   PACEMAKER INSERTION  03/2008   by Dr. 08/17/2019  Short Social History:  Social History   Tobacco Use   Smoking status: Every Day    Packs/day: 1.00    Years: 25.00    Pack years: 25.00    Types: Cigarettes   Smokeless tobacco: Never  Substance Use Topics   Alcohol use: Not Currently    Alcohol/week: 0.0 standard drinks    No Known Allergies  Current Outpatient Medications  Medication Sig Dispense Refill   Aspirin-Acetaminophen-Caffeine (GOODY HEADACHE PO) Take 1 packet by mouth daily as needed for headaches     atorvastatin (LIPITOR) 20 MG tablet Take 1 tablet (20 mg total) by mouth daily. 90 tablet 3   gabapentin (NEURONTIN) 300 MG capsule Take 300 mg by mouth 3 (three) times daily.     glimepiride (AMARYL) 4 MG tablet Take 1  tablet by mouth once daily 90 tablet 2   hydrochlorothiazide (MICROZIDE) 12.5 MG capsule Take 1 capsule (12.5 mg total) by mouth daily. 90 capsule 3   HYDROcodone-acetaminophen (NORCO) 10-325 MG tablet Take 1 tablet by mouth 3 (three) times daily.     lisinopril (ZESTRIL) 20 MG tablet TAKE 1 TABLET BY MOUTH IN THE MORNING AND AT BEDTIME 180 tablet 0   metFORMIN (GLUCOPHAGE-XR) 500 MG 24 hr tablet TAKE 1 TABLET BY MOUTH ONCE DAILY WITH EVENING MEAL 90 tablet 2   amLODipine (NORVASC) 5 MG tablet Take 1 tablet (5 mg total) by mouth daily. 180 tablet 3   No current facility-administered medications for this visit.    Review of Systems  Constitutional:  Constitutional negative. HENT: HENT negative.  Eyes: Eyes negative.  Respiratory: Respiratory negative.  Cardiovascular: Cardiovascular negative.  GI: Gastrointestinal negative.  Musculoskeletal: Positive for leg pain.  Skin: Positive for wound.  Neurological: Neurological negative. Hematologic: Hematologic/lymphatic negative.  Psychiatric: Psychiatric negative.       Objective:  Objective  Vitals:   12/10/20 1234  BP: (!) 171/82  Pulse: (!) 110  Resp: 20  Temp: 98.6 F (37 C)  SpO2: 95%     Physical Exam HENT:     Head: Normocephalic.     Nose:     Comments: Wearing a mask Eyes:     Pupils: Pupils are equal, round, and reactive to light.  Cardiovascular:     Pulses:          Femoral pulses are 2+ on the right side and 2+ on the left side.      Popliteal pulses are 0 on the right side and 0 on the left side.     Comments: Both common femoral arteries are heavily calcified and palpable Pulmonary:     Effort: Pulmonary effort is normal.  Abdominal:     General: Abdomen is flat.     Palpations: Abdomen is soft. There is no mass.  Musculoskeletal:     Right lower leg: No edema.     Left lower leg: No edema.  Skin:    Comments: Superficial ulceration of left medial small toe  Neurological:     General: No focal  deficit present.     Mental Status: He is alert.  Psychiatric:        Mood and Affect: Mood normal.        Behavior: Behavior normal.        Thought Content: Thought content normal.        Judgment: Judgment normal.    Data: ABI Findings:  +---------+------------------+-----+--------+--------+  Right    Rt Pressure (mmHg)IndexWaveformComment   +---------+------------------+-----+--------+--------+  Brachial 170                                      +---------+------------------+-----+--------+--------+  PTA      141               0.83 biphasic          +---------+------------------+-----+--------+--------+  DP       131               0.77 biphasic          +---------+------------------+-----+--------+--------+  Great Toe112               0.66 Abnormal          +---------+------------------+-----+--------+--------+   +---------+------------------+-----+----------+-------+  Left     Lt Pressure (mmHg)IndexWaveform  Comment  +---------+------------------+-----+----------+-------+  Brachial 170                                       +---------+------------------+-----+----------+-------+  PTA      30                0.18 monophasic         +---------+------------------+-----+----------+-------+  DP       23                0.14 monophasic         +---------+------------------+-----+----------+-------+  Great Toe                       Absent             +---------+------------------+-----+----------+-------+   +-------+-----------+-----------+------------+------------+  ABI/TBIToday's ABIToday's TBIPrevious ABIPrevious TBI  +-------+-----------+-----------+------------+------------+  Right  0.83       0.66                                 +-------+-----------+-----------+------------+------------+  Left   0.18       0.00                                  +-------+-----------+-----------+------------+------------+           Assessment/Plan:     65 year old male with severely depressed ABIs and absent great toe pressure on the left with symptoms lasting for approximately the past 2 weeks and new superficial ulceration of his left small toe.  I recommended he begin aspirin therapy he is already on a statin.  We will begin with angiography from a right common femoral approach early next week.  I discussed with the patient that I am not available but I did not think that we should delay for my return from vacation.  I discussed with the patient the possible outcomes including stenting, balloon angioplasty, and high likelihood to need surgical bypass given his severely depressed ABI.  All of his questions were answered to his satisfaction we will get him scheduled today.     Maeola Harman MD [facility]

## 2020-12-14 ENCOUNTER — Ambulatory Visit (HOSPITAL_COMMUNITY)
Admission: RE | Admit: 2020-12-14 | Discharge: 2020-12-14 | Disposition: A | Discharge status: Home / Self Care | Payer: PPO | Attending: Surgery | Admitting: Surgery

## 2020-12-14 ENCOUNTER — Other Ambulatory Visit: Payer: Self-pay

## 2020-12-14 ENCOUNTER — Ambulatory Visit (HOSPITAL_COMMUNITY)
Admission: RE | Disposition: A | Discharge status: Home / Self Care | Payer: Self-pay | Source: Home / Self Care | Attending: Surgery

## 2020-12-14 DIAGNOSIS — Z833 Family history of diabetes mellitus: Secondary | ICD-10-CM | POA: Insufficient documentation

## 2020-12-14 DIAGNOSIS — I70245 Atherosclerosis of native arteries of left leg with ulceration of other part of foot: Secondary | ICD-10-CM | POA: Insufficient documentation

## 2020-12-14 DIAGNOSIS — Z79899 Other long term (current) drug therapy: Secondary | ICD-10-CM | POA: Insufficient documentation

## 2020-12-14 DIAGNOSIS — E11621 Type 2 diabetes mellitus with foot ulcer: Secondary | ICD-10-CM | POA: Insufficient documentation

## 2020-12-14 DIAGNOSIS — L97529 Non-pressure chronic ulcer of other part of left foot with unspecified severity: Secondary | ICD-10-CM | POA: Diagnosis not present

## 2020-12-14 DIAGNOSIS — E1151 Type 2 diabetes mellitus with diabetic peripheral angiopathy without gangrene: Secondary | ICD-10-CM | POA: Insufficient documentation

## 2020-12-14 DIAGNOSIS — Z7984 Long term (current) use of oral hypoglycemic drugs: Secondary | ICD-10-CM | POA: Diagnosis not present

## 2020-12-14 DIAGNOSIS — F17210 Nicotine dependence, cigarettes, uncomplicated: Secondary | ICD-10-CM | POA: Insufficient documentation

## 2020-12-14 HISTORY — PX: PERIPHERAL VASCULAR INTERVENTION: CATH118257

## 2020-12-14 HISTORY — PX: ABDOMINAL AORTOGRAM W/LOWER EXTREMITY: CATH118223

## 2020-12-14 LAB — GLUCOSE, CAPILLARY
Glucose-Capillary: 206 mg/dL — ABNORMAL HIGH (ref 70–99)
Glucose-Capillary: 136 mg/dL — ABNORMAL HIGH (ref 70–99)

## 2020-12-14 LAB — POCT I-STAT, CHEM 8
BUN: 15 mg/dL (ref 8–23)
Calcium, Ion: 1.16 mmol/L (ref 1.15–1.40)
Chloride: 102 mmol/L (ref 98–111)
Creatinine, Ser: 0.7 mg/dL (ref 0.61–1.24)
Glucose, Bld: 191 mg/dL — ABNORMAL HIGH (ref 70–99)
HCT: 47 % (ref 39.0–52.0)
Hemoglobin: 16 g/dL (ref 13.0–17.0)
Potassium: 3.9 mmol/L (ref 3.5–5.1)
Sodium: 138 mmol/L (ref 135–145)
TCO2: 26 mmol/L (ref 22–32)

## 2020-12-14 LAB — POCT ACTIVATED CLOTTING TIME: Activated Clotting Time: 225 seconds

## 2020-12-14 SURGERY — ABDOMINAL AORTOGRAM W/LOWER EXTREMITY
Anesthesia: LOCAL

## 2020-12-14 MED ORDER — SODIUM CHLORIDE 0.9 % IV SOLN: 250.0000 mL | INTRAVENOUS | Status: DC | PRN

## 2020-12-14 MED ORDER — HEPARIN SODIUM (PORCINE) 1000 UNIT/ML IJ SOLN
INTRAMUSCULAR | Status: AC
  Filled 2020-12-14: qty 1

## 2020-12-14 MED ORDER — ASPIRIN EC 81 MG PO TBEC: 81.0000 mg | DELAYED_RELEASE_TABLET | Freq: Every day | ORAL | Status: DC

## 2020-12-14 MED ORDER — SODIUM CHLORIDE 0.9 % IV SOLN: INTRAVENOUS | Status: DC

## 2020-12-14 MED ORDER — ACETAMINOPHEN 325 MG PO TABS: 650.0000 mg | ORAL_TABLET | ORAL | Status: DC | PRN

## 2020-12-14 MED ORDER — HYDRALAZINE HCL 20 MG/ML IJ SOLN: 5.0000 mg | INTRAMUSCULAR | Status: DC | PRN

## 2020-12-14 MED ORDER — MORPHINE SULFATE (PF) 2 MG/ML IV SOLN: 2.0000 mg | INTRAVENOUS | Status: DC | PRN

## 2020-12-14 MED ORDER — ROSUVASTATIN CALCIUM 10 MG PO TABS: 10.0000 mg | ORAL_TABLET | Freq: Every day | ORAL | Status: DC

## 2020-12-14 MED ORDER — CLOPIDOGREL BISULFATE 300 MG PO TABS
ORAL_TABLET | ORAL | Status: DC | PRN
  Administered 2020-12-14: 300 mg via ORAL

## 2020-12-14 MED ORDER — IODIXANOL 320 MG/ML IV SOLN
INTRAVENOUS | Status: DC | PRN
  Administered 2020-12-14: 150 mL

## 2020-12-14 MED ORDER — FENTANYL CITRATE (PF) 100 MCG/2ML IJ SOLN
INTRAMUSCULAR | Status: AC
  Filled 2020-12-14: qty 2

## 2020-12-14 MED ORDER — MIDAZOLAM HCL 2 MG/2ML IJ SOLN
INTRAMUSCULAR | Status: AC
  Filled 2020-12-14: qty 2

## 2020-12-14 MED ORDER — SODIUM CHLORIDE 0.9% FLUSH: 3.0000 mL | Freq: Two times a day (BID) | INTRAVENOUS | Status: DC

## 2020-12-14 MED ORDER — ROSUVASTATIN CALCIUM 10 MG PO TABS: 10.0000 mg | ORAL_TABLET | Freq: Every day | ORAL | 11 refills | Status: DC

## 2020-12-14 MED ORDER — ASPIRIN EC 81 MG PO TBEC: 81.0000 mg | DELAYED_RELEASE_TABLET | Freq: Every day | ORAL | 2 refills | Status: AC

## 2020-12-14 MED ORDER — CLOPIDOGREL BISULFATE 75 MG PO TABS: 75.0000 mg | ORAL_TABLET | Freq: Every day | ORAL | 11 refills | Status: DC

## 2020-12-14 MED ORDER — HEPARIN (PORCINE) IN NACL 1000-0.9 UT/500ML-% IV SOLN
INTRAVENOUS | Status: DC | PRN
  Administered 2020-12-14 (×2): 500 mL

## 2020-12-14 MED ORDER — OXYCODONE HCL 5 MG PO TABS: 5.0000 mg | ORAL_TABLET | ORAL | Status: DC | PRN

## 2020-12-14 MED ORDER — MIDAZOLAM HCL 2 MG/2ML IJ SOLN
INTRAMUSCULAR | Status: DC | PRN
  Administered 2020-12-14 (×2): 1 mg via INTRAVENOUS
  Administered 2020-12-14: 2 mg via INTRAVENOUS

## 2020-12-14 MED ORDER — CEPHALEXIN 500 MG PO CAPS: 500.0000 mg | ORAL_CAPSULE | Freq: Four times a day (QID) | ORAL | 0 refills | Status: AC

## 2020-12-14 MED ORDER — CLOPIDOGREL BISULFATE 75 MG PO TABS: 300.0000 mg | ORAL_TABLET | Freq: Once | ORAL | Status: DC

## 2020-12-14 MED ORDER — FENTANYL CITRATE (PF) 100 MCG/2ML IJ SOLN
INTRAMUSCULAR | Status: DC | PRN
  Administered 2020-12-14: 50 ug via INTRAVENOUS
  Administered 2020-12-14 (×2): 25 ug via INTRAVENOUS

## 2020-12-14 MED ORDER — SODIUM CHLORIDE 0.9 % WEIGHT BASED INFUSION: 1.0000 mL/kg/h | INTRAVENOUS | Status: DC

## 2020-12-14 MED ORDER — HEPARIN (PORCINE) IN NACL 1000-0.9 UT/500ML-% IV SOLN
INTRAVENOUS | Status: AC
  Filled 2020-12-14: qty 1000

## 2020-12-14 MED ORDER — LIDOCAINE HCL (PF) 1 % IJ SOLN
INTRAMUSCULAR | Status: AC
  Filled 2020-12-14: qty 30

## 2020-12-14 MED ORDER — SODIUM CHLORIDE 0.9% FLUSH: 3.0000 mL | INTRAVENOUS | Status: DC | PRN

## 2020-12-14 MED ORDER — HEPARIN SODIUM (PORCINE) 1000 UNIT/ML IJ SOLN
INTRAMUSCULAR | Status: DC | PRN
  Administered 2020-12-14: 7000 [IU] via INTRAVENOUS

## 2020-12-14 MED ORDER — LABETALOL HCL 5 MG/ML IV SOLN: 10.0000 mg | INTRAVENOUS | Status: DC | PRN

## 2020-12-14 MED ORDER — ONDANSETRON HCL 4 MG/2ML IJ SOLN: 4.0000 mg | Freq: Four times a day (QID) | INTRAMUSCULAR | Status: DC | PRN

## 2020-12-14 MED ORDER — CLOPIDOGREL BISULFATE 75 MG PO TABS: 75.0000 mg | ORAL_TABLET | Freq: Every day | ORAL | Status: DC

## 2020-12-14 MED ORDER — CLOPIDOGREL BISULFATE 300 MG PO TABS
ORAL_TABLET | ORAL | Status: AC
  Filled 2020-12-14: qty 1

## 2020-12-14 SURGICAL SUPPLY — 19 items
BALLN STERLING OTW 3X40X150 (BALLOONS) ×3
BALLN STERLING OTW 4X40X135 (BALLOONS) ×3
BALLOON STERLING OTW 3X40X150 (BALLOONS) IMPLANT
BALLOON STERLING OTW 4X40X135 (BALLOONS) IMPLANT
CATH OMNI FLUSH 5F 65CM (CATHETERS) ×1 IMPLANT
DEVICE VASC CLSR CELT ART 6 (Vascular Products) ×1 IMPLANT
KIT ENCORE 26 ADVANTAGE (KITS) ×1 IMPLANT
KIT MICROPUNCTURE NIT STIFF (SHEATH) ×1 IMPLANT
KIT PV (KITS) ×3 IMPLANT
SHEATH PINNACLE 5F 10CM (SHEATH) ×1 IMPLANT
SHEATH PINNACLE 6F 10CM (SHEATH) ×1 IMPLANT
SHEATH PINNACLE MP 6F 45CM (SHEATH) ×1 IMPLANT
SHEATH PROBE COVER 6X72 (BAG) ×1 IMPLANT
STENT INNOVA 5X60X130 (Permanent Stent) ×1 IMPLANT
SYR MEDRAD MARK V 150ML (SYRINGE) ×1 IMPLANT
TRANSDUCER W/STOPCOCK (MISCELLANEOUS) ×3 IMPLANT
TRAY PV CATH (CUSTOM PROCEDURE TRAY) ×3 IMPLANT
WIRE G V18X300CM (WIRE) ×1 IMPLANT
WIRE STARTER BENTSON 035X150 (WIRE) ×1 IMPLANT

## 2020-12-14 NOTE — Discharge Instructions (Signed)
Femoral Site Care This sheet gives you information about how to care for yourself after your procedure. Your health care provider may also give you more specific instructions. If you have problems or questions, contact your health care provider. What can I expect after the procedure?  After the procedure, it is common to have: Bruising that usually fades within 1-2 weeks. Tenderness at the site. Follow these instructions at home: Wound care Follow instructions from your health care provider about how to take care of your insertion site. Make sure you: Wash your hands with soap and water before you change your bandage (dressing). If soap and water are not available, use hand sanitizer. Remove your dressing as told by your health care provider. In 24 hours Do not take baths, swim, or use a hot tub until your health care provider approves. You may shower 24-48 hours after the procedure or as told by your health care provider. Gently wash the site with plain soap and water. Pat the area dry with a clean towel. Do not rub the site. This may cause bleeding. Do not apply powder or lotion to the site. Keep the site clean and dry. Check your femoral site every day for signs of infection. Check for: Redness, swelling, or pain. Fluid or blood. Warmth. Pus or a bad smell. Activity For the first 2-3 days after your procedure, or as long as directed: Avoid climbing stairs as much as possible. Do not squat. Do not lift anything that is heavier than 10 lb (4.5 kg), or the limit that you are told, until your health care provider says that it is safe. For 5 days Rest as directed. Avoid sitting for a long time without moving. Get up to take short walks every 1-2 hours. Do not drive for 24 hours if you were given a medicine to help you relax (sedative). General instructions Take over-the-counter and prescription medicines only as told by your health care provider. Keep all follow-up visits as told by  your health care provider. This is important. Contact a health care provider if you have: A fever or chills. You have redness, swelling, or pain around your insertion site. Get help right away if: The catheter insertion area swells very fast. You pass out. You suddenly start to sweat or your skin gets clammy. The catheter insertion area is bleeding, and the bleeding does not stop when you hold steady pressure on the area. The area near or just beyond the catheter insertion site becomes pale, cool, tingly, or numb. These symptoms may represent a serious problem that is an emergency. Do not wait to see if the symptoms will go away. Get medical help right away. Call your local emergency services (911 in the [place] Do not drive yourself to the hospital. Summary After the procedure, it is common to have bruising that usually fades within 1-2 weeks. Check your femoral site every day for signs of infection. Do not lift anything that is heavier than 10 lb (4.5 kg), or the limit that you are told, until your health care provider says that it is safe. This information is not intended to replace advice given to you by your health care provider. Make sure you discuss any questions you have with your health care provider. Document Revised: 05/07/2017 Document Reviewed: 05/07/2017 Elsevier Patient Education  2020 [employer] 

## 2020-12-14 NOTE — Op Note (Signed)
Patient name: Andrew Wiggins MRN: [id number] DOB: 10-May-1955 Sex: male  12/14/2020 Pre-operative Diagnosis: Left toe ulcer Post-operative diagnosis:  Same Surgeon:  [name] Procedure Performed:  1.  Ultrasound-guided access, right femoral artery  2.  Abdominal aortogram  3.  Bilateral lower extremity runoff  4.  Stent, left superficial femoral artery  5.  Closure device, ((Celt)  6.  Conscious sedation, 57 minutes    Indications: This is a 65 year old gentleman with left toe ulcer.  ABIs in the office were 0.18 with absent toe pressures on the left.  He comes in today for angiographic evaluation and possible invention.  Procedure:  The patient was identified in the holding area and taken to room 8.  The patient was then placed supine on the table and prepped and draped in the usual sterile fashion.  A time out was called.  Conscious sedation was administered with the use of IV fentanyl and Versed under continuous physician and nurse monitoring.  Heart rate, blood pressure, and oxygen saturation were continuously monitored.  Total sedation time was 57 minutes.  Ultrasound was used to evaluate the right common femoral artery.  It was patent .  A digital ultrasound image was acquired.  A micropuncture needle was used to access the right common femoral artery under ultrasound guidance.  An 018 wire was advanced without resistance and a micropuncture sheath was placed.  The 018 wire was removed and a benson wire was placed.  The micropuncture sheath was exchanged for a 5 french sheath.  An omniflush catheter was advanced over the wire to the level of L-1.  An abdominal angiogram was obtained.  Next, using the omniflush catheter and a benson wire, the aortic bifurcation was crossed and the catheter was placed into theleft external iliac artery and left runoff was obtained.  right runoff was performed via retrograde sheath injections.  Findings:   Aortogram: No significant renal artery stenosis  was identified.  The infrarenal abdominal aorta is widely patent without stenosis.  Bilateral common and external iliac arteries are small in caliber but patent without significant stenosis  Right Lower Extremity: The common femoral and profundofemoral artery are small in caliber but widely patent.  The superficial femoral artery has mild disease throughout it is very small in caliber but no hemodynamically significant stenosis.  Popliteal artery is patent throughout its course.  There is three-vessel runoff.  Left Lower Extremity: The left common femoral profundofemoral artery are small in caliber but widely patent.  The superficial femoral artery is patent however just proximal to the adductor canal there is a 90% short segment stenosis.  Popliteal artery is patent throughout its course with three-vessel runoff.  There is diffuse disease out onto the foot.  Intervention: After the above images were acquired the decision was made to proceed with intervention.  A 6 French sheath was advanced into the left superficial femoral artery.  The patient was fully heparinized.  I then used a V-18 wire with the support of a 3 x 40 Sterling balloon.  I was able to easily cross the lesion and performed primary balloon angioplasty at nominal pressure for 2 minutes.  Completion imaging showed improvement in the stenosis however there was a small dissection flap.  I reinserted the balloon and repeated a low inflation prolonged angioplasty.  On completion there was still a residual dissection.  It was not flow-limiting however I did not want to leave this.  Also did not really want to put a stent  in because of the small size of his vessels however I ultimately elected to place a 5 x 60 INNOVA stent which was postdilated with a 4 mm balloon.  Completion imaging showed resolution of the dissection and stenosis.  There was much more brisk flow down to the foot.  His runoff was unchanged.  At this point catheters and wires were  removed.  The groin was closed using the CELT device without complication  Impression:  #1  Very small caliber vessels throughout  #2  Focal short segment high-grade lesion within the left superficial femoral artery initially treated with primary balloon angioplasty with a dissection which was covered using a 5 x 60 INNOVA stent with complete resolution  #3  Patient will be placed on IV antibiotics, aspirin, statin, and Plavix.  He will return to the office and approximately 2 weeks for evaluation of the ulcer on his left foot.     Juleen China, M.D., [facility] [place] Office: [phone] Pager:  [phone]

## 2020-12-14 NOTE — Interval H&P Note (Signed)
History and Physical Interval Note:  12/14/2020 7:28 AM  Andrew Wiggins  has presented today for surgery, with the diagnosis of PAD.  The various methods of treatment have been discussed with the patient and family. After consideration of risks, benefits and other options for treatment, the patient has consented to  Procedure(s): ABDOMINAL AORTOGRAM W/LOWER EXTREMITY (N/A) as a surgical intervention.  The patient's history has been reviewed, patient examined, no change in status, stable for surgery.  I have reviewed the patient's chart and labs.  Questions were answered to the patient's satisfaction.     [name]

## 2020-12-15 ENCOUNTER — Encounter (HOSPITAL_COMMUNITY): Payer: Self-pay | Admitting: Surgery

## 2020-12-15 MED FILL — Lidocaine HCl Local Preservative Free (PF) Inj 1%: INTRAMUSCULAR | Qty: 30 | Status: AC

## 2020-12-21 ENCOUNTER — Telehealth: Payer: Self-pay

## 2020-12-21 ENCOUNTER — Other Ambulatory Visit: Payer: Self-pay | Admitting: Cardiology

## 2020-12-21 DIAGNOSIS — E1169 Type 2 diabetes mellitus with other specified complication: Secondary | ICD-10-CM

## 2020-12-21 NOTE — Telephone Encounter (Signed)
Pt called to inquire if his antibiotic Keflex needed to be renewed once completed on 12/24/20. Reports the majority of redness in his foot is gone but he still has some spasm pains at night and numbness when walking. He denies any open areas, drainage, skin hot to touch or fever. Advised pt based on what he described, it does not sound like he needs antibiotics renewed. Pt was recommended to follow up in 2 weeks with PA for wound check with bilateral ABIs and a left leg duplex with appt on 01/17/21. Appt moved up 12/29/20. Pt voiced understanding.

## 2020-12-22 ENCOUNTER — Telehealth: Payer: Self-pay | Admitting: Cardiology

## 2020-12-22 DIAGNOSIS — E1169 Type 2 diabetes mellitus with other specified complication: Secondary | ICD-10-CM

## 2020-12-22 MED ORDER — LISINOPRIL 20 MG PO TABS: ORAL_TABLET | ORAL | 0 refills | Status: DC

## 2020-12-22 NOTE — Telephone Encounter (Signed)
*  STAT* If patient is at the pharmacy, call can be transferred to refill team.   1. Which medications need to be refilled? (please list name of each medication and dose if known)  lisinopril (ZESTRIL) 20 MG tablet  2. Which pharmacy/location (including street and city if local pharmacy) is medication to be sent to? [facility] - [place], [place] - [address]       3. Do they need a 30 day or 90 day supply? 90 with refills  Patient is out of medication

## 2020-12-22 NOTE — Telephone Encounter (Signed)
Refill sent in per request.  

## 2020-12-23 ENCOUNTER — Other Ambulatory Visit: Payer: Self-pay

## 2020-12-23 DIAGNOSIS — M47816 Spondylosis without myelopathy or radiculopathy, lumbar region: Secondary | ICD-10-CM | POA: Diagnosis not present

## 2020-12-23 DIAGNOSIS — Z1389 Encounter for screening for other disorder: Secondary | ICD-10-CM | POA: Diagnosis not present

## 2020-12-23 DIAGNOSIS — G894 Chronic pain syndrome: Secondary | ICD-10-CM | POA: Diagnosis not present

## 2020-12-23 DIAGNOSIS — I739 Peripheral vascular disease, unspecified: Secondary | ICD-10-CM

## 2020-12-23 DIAGNOSIS — M5137 Other intervertebral disc degeneration, lumbosacral region: Secondary | ICD-10-CM | POA: Diagnosis not present

## 2020-12-23 DIAGNOSIS — M549 Dorsalgia, unspecified: Secondary | ICD-10-CM | POA: Diagnosis not present

## 2020-12-23 DIAGNOSIS — M48061 Spinal stenosis, lumbar region without neurogenic claudication: Secondary | ICD-10-CM | POA: Diagnosis not present

## 2020-12-23 DIAGNOSIS — M5136 Other intervertebral disc degeneration, lumbar region: Secondary | ICD-10-CM | POA: Diagnosis not present

## 2020-12-29 ENCOUNTER — Ambulatory Visit (INDEPENDENT_AMBULATORY_CARE_PROVIDER_SITE_OTHER)
Admission: RE | Admit: 2020-12-29 | Discharge: 2020-12-29 | Disposition: A | Discharge status: Home / Self Care | Payer: PPO | Source: Ambulatory Visit | Attending: Vascular Surgery | Admitting: Vascular Surgery

## 2020-12-29 ENCOUNTER — Other Ambulatory Visit: Payer: Self-pay

## 2020-12-29 ENCOUNTER — Ambulatory Visit (INDEPENDENT_AMBULATORY_CARE_PROVIDER_SITE_OTHER): Payer: PPO | Admitting: Physician Assistant

## 2020-12-29 ENCOUNTER — Ambulatory Visit (HOSPITAL_COMMUNITY)
Admission: RE | Admit: 2020-12-29 | Discharge: 2020-12-29 | Disposition: A | Discharge status: Home / Self Care | Payer: PPO | Source: Ambulatory Visit | Attending: Vascular Surgery | Admitting: Vascular Surgery

## 2020-12-29 VITALS — BP 161/77 | HR 107 | Temp 97.6°F | Ht 70.0 in | Wt 142.5 lb

## 2020-12-29 DIAGNOSIS — I739 Peripheral vascular disease, unspecified: Secondary | ICD-10-CM

## 2020-12-29 NOTE — Progress Notes (Signed)
History of Present Illness:  Patient is a 65 y.o. year old male who presents for evaluation of PAD.  He has a history of rest pain in the left LE mixed with  sciatica.  He continues to smoke on a daily basis.  Past medical history includes: DM, small ulcer between 4th/5th toes, CAD, COPD and Dyslipidemia.  He takes ASA, Plavix and a Stain daily.  He states his pain may be a little better.  He still has pain that wakes him up and once he walks around it helps.  He uses peroxide and triple antibiotic on the ulcer daily.    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 ~  Jan11:  Last seen by Cards & pacer reprogrammed...     Cardiac pacemaker in situ    Carotid artery disease (HCC) 07/27/2010   R/O PERIPHERAL VASCULAR DISEASE (ICD-443.9) - on ASA 81mg /d... exam 11/09 shows gr 1-2 sys murmur LSB, but also has prominent bruit to base of right side of his neck w/ right > left Carotid Bruit... ~  CDopplers 11/09 showed severe plaque right ICA, & min plaque on left... no signif ICA stenoses per report. 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 smokes 1/2 - 1ppd x yrs... ~  PFT's 11/08 showed FVC= 3.85 (79%), FEV1= 2.19 (55%), %1sec= 57, mid-flows= 32% predicted:  all c/w mod airflow obstruction...  ~  CXR 12/10 showed left subclav pacer, mild hyperinflation, NAD...     Dermatitis, contact    due to poison ivy   Diabetes mellitus with neuropathy (HCC) 12/20/2016   Dyslipidemia 03/06/2012   Essential  hypertension 04/10/2007    HYPERTENSION (ICD-401.9) - prev on Micardis 80mg /d then switched to Losartan 100mg /d, but he lost his job in 2011& couldn't afford Rx;  He called 14/07/2006 2/12 w/ this news & we phoned in Lisinopril 20mg /d... ~  2DEcho 11/09 showed sl incr LVwall thickness, norm LVF w/ EF= 55-60%, no regional wall motion abn, mild DD, mild MR... ~  Myoview 12/09 showed LBBB, no perfusion defects, abn septal motion & EF   GERD 03/26/2008    GERD (ICD-530.81) - prev mild GERD symptoms that improved after cholecystectomy on 2000... uses OTC PPI as needed... ~  he is due for routine screening colonoscopy...       GERD (gastroesophageal reflux disease)    Headache(784.0)    Hypertension    Mixed hyperlipidemia 06/19/2019   Peripheral vascular disease (HCC)    Sciatica 11/18/2019   Tobacco use disorder     Past Surgical History:  Procedure Laterality Date   ABDOMINAL AORTOGRAM W/LOWER EXTREMITY N/A 12/14/2020   Procedure: ABDOMINAL AORTOGRAM W/LOWER EXTREMITY;  Surgeon: 2001, MD;  Location: [facility];  Service: Cardiovascular;  Laterality: N/A;   laproscopic cholecystectomy  2000   by Dr. 08/17/2019   PACEMAKER INSERTION  03/2008   by Dr. 02/13/2021   PERIPHERAL VASCULAR INTERVENTION Left 12/14/2020   Procedure: PERIPHERAL VASCULAR INTERVENTION;  Surgeon: Odie Sera  W, MD;  Location: [facility];  Service: Cardiovascular;  Laterality: Left;  SFA    ROS:   General:  No weight loss, Fever, chills  HEENT: No recent headaches, no nasal bleeding, no visual changes, no sore throat  Neurologic: No dizziness, blackouts, seizures. No recent symptoms of stroke or mini- stroke. No recent episodes of slurred speech, or temporary blindness.  Cardiac: No recent episodes of chest pain/pressure, no shortness of breath at rest.  No shortness of breath with exertion.  Denies history of atrial fibrillation or irregular heartbeat  Vascular: No history of rest pain in feet.  No  history of claudication.  No history of non-healing ulcer, No history of DVT   Pulmonary: No home oxygen, no productive cough, no hemoptysis,  No asthma or wheezing  Musculoskeletal:   Arthritis,  Low back pain,   Joint pain  Hematologic:No history of hypercoagulable state.  No history of easy bleeding.  No history of anemia  Gastrointestinal: No hematochezia or melena,  No gastroesophageal reflux, no trouble swallowing  Urinary:  chronic Kidney disease,  on HD -  MWF or  TTHS,  Burning with urination,  Frequent urination,  Difficulty urinating;   Skin: No rashes  Psychological: No history of anxiety,  No history of depression  Social History Social History   Tobacco Use   Smoking status: Every Day    Packs/day: 1.00    Years: 25.00    Pack years: 25.00    Types: Cigarettes   Smokeless tobacco: Never  Vaping Use   Vaping Use: Never used  Substance Use Topics   Alcohol use: Not Currently    Alcohol/week: 0.0 standard drinks   Drug use: No    Family History Family History  Problem Relation Age of Onset   Diabetes Brother     Allergies  No Known Allergies   Current Outpatient Medications  Medication Sig Dispense Refill   aspirin EC 81 MG tablet Take 1 tablet (81 mg total) by mouth daily. Swallow whole. 150 tablet 2   Aspirin-Acetaminophen-Caffeine (GOODY HEADACHE PO) Take 1 Package by mouth daily as needed (headache).     atorvastatin (LIPITOR) 20 MG tablet Take 1 tablet (20 mg total) by mouth daily. 90 tablet 3   clopidogrel (PLAVIX) 75 MG tablet Take 1 tablet (75 mg total) by mouth daily. 30 tablet 11   glimepiride (AMARYL) 4 MG tablet Take 1 tablet by mouth once daily 90 tablet 2   hydrochlorothiazide (MICROZIDE) 12.5 MG capsule Take 1 capsule (12.5 mg total) by mouth daily. 90 capsule 3   HYDROcodone-acetaminophen (NORCO) 10-325 MG tablet Take 1 tablet by mouth 3 (three) times daily.     lisinopril (ZESTRIL) 20 MG tablet TAKE 1  TABLET BY MOUTH IN THE MORNING AND AT BEDTIME 180 tablet 0   metFORMIN (GLUCOPHAGE-XR) 500 MG 24 hr tablet TAKE 1 TABLET BY MOUTH ONCE DAILY WITH EVENING MEAL 90 tablet 2   rosuvastatin (CRESTOR) 10 MG tablet Take 1 tablet (10 mg total) by mouth daily. 30 tablet 11   amLODipine (NORVASC) 5 MG tablet Take 1 tablet (5 mg total) by mouth daily. 180 tablet 3   No current facility-administered medications for this visit.    Physical Examination  Vitals:   12/29/20 0854  BP: (!) 161/77  Pulse: (!) 107  Temp: 97.6 F (36.4 C)  TempSrc: Skin  SpO2: 100%  Weight: 142 lb 8 oz (64.6 kg)  Height: 5\' 10"  (1.778 m)    Body mass index is 20.45 kg/m.  General:  Alert and oriented, no acute distress HEENT: Normal Neck: No bruit or JVD Pulmonary: Clear to auscultation bilaterally Cardiac: Regular Rate and Rhythm without murmur Abdomen: Soft, non-tender, non-distended, no mass, no scars Skin: No rash, 4th/5th toe web ulcer, no erythema or drainage Extremity Pulses:  Brisk doppler signals left DP/PT/Peroneal Musculoskeletal: No deformity or edema  Neurologic: Upper and lower extremity motor grossly intact and symmetric  DATA:   +-----------+--------+-----+---------------+--------+--------+  LEFT       PSV cm/sRatioStenosis       WaveformComments  +-----------+--------+-----+---------------+--------+--------+  CFA Prox   144                         biphasic          +-----------+--------+-----+---------------+--------+--------+  CFA Distal 267          30-49% stenosisbiphasic          +-----------+--------+-----+---------------+--------+--------+  DFA        188                         biphasic          +-----------+--------+-----+---------------+--------+--------+  SFA Prox   158                         biphasic          +-----------+--------+-----+---------------+--------+--------+  SFA Mid    171                         biphasic           +-----------+--------+-----+---------------+--------+--------+  SFA Distal                                     stent     +-----------+--------+-----+---------------+--------+--------+  POP Prox   116                         biphasic          +-----------+--------+-----+---------------+--------+--------+  POP Distal 146                         biphasic          +-----------+--------+-----+---------------+--------+--------+  ATA Distal 112                         biphasic          +-----------+--------+-----+---------------+--------+--------+  PTA Distal 90                          biphasic          +-----------+--------+-----+---------------+--------+--------+  PERO Distal56                          biphasic          +-----------+--------+-----+---------------+--------+--------+      Left Stent(s):  +---------------+--------+--------+--------+--------+  SFA            PSV cm/sStenosisWaveformComments  +---------------+--------+--------+--------+--------+  Prox to Stent  166                               +---------------+--------+--------+--------+--------+  Proximal Stent 168                               +---------------+--------+--------+--------+--------+  Mid Stent      70                                +---------------+--------+--------+--------+--------+  Distal Stent   64                                +---------------+--------+--------+--------+--------+  Distal to Stent71                                +---------------+--------+--------+--------+--------+   Summary:  Left: Elevated velocities suggest 30-49% stenosis noted in the common  femoral artery. Patent stent with Left Lower Extremity: The left common femoral profundofemoral artery are small in caliber but widely patent.  The superficial femoral artery is patent however just proximal to the adductor canal there is a 90% short segment  stenosis.  Popliteal artery is patent throughout its course with three-vessel runoff.  There is diffuse disease out onto the foot.in the  superficial femoral artery artery.   +---------+------------------+-----+--------+--------+  Right    Rt Pressure (mmHg)IndexWaveformComment   +---------+------------------+-----+--------+--------+  Brachial 153                                      +---------+------------------+-----+--------+--------+  PTA      143               0.85 biphasic          +---------+------------------+-----+--------+--------+  DP       137               0.82 biphasic          +---------+------------------+-----+--------+--------+  Great Toe107               0.64                   +---------+------------------+-----+--------+--------+   +---------+------------------+-----+--------+-------+  Left     Lt Pressure (mmHg)IndexWaveformComment  +---------+------------------+-----+--------+-------+  Brachial 168                                     +---------+------------------+-----+--------+-------+  PTA      129               0.77 biphasic         +---------+------------------+-----+--------+-------+  DP       123               0.73 biphasic         +---------+------------------+-----+--------+-------+  Great Toe60                0.36                  +---------+------------------+-----+--------+-------+   +-------+-----------+-----------+------------+------------+  ABI/TBIToday's ABIToday's TBIPrevious ABIPrevious TBI  +-------+-----------+-----------+------------+------------+  Right  0.85       0.64       0.83        0.66          +-------+-----------+-----------+------------+------------+  Left   0.77       0.36       0.18        0             +-------+-----------+-----------+------------+------------+     ABI  Right ABIs and TBIs appear essentially unchanged compared to prior study  on  12/10/2020. Left ABIs and TBIs appear increased.     Summary:  Right: Resting right ankle-brachial index indicates mild right lower  extremity arterial disease. The right toe-brachial index is abnormal.   Left: Resting left ankle-brachial index indicates moderate left lower  extremity arterial disease. The left toe-brachial index is abnormal.   ASSESSMENT:  PAD s/p Stent, left superficial femoral artery  ABI shows improved index no 0.77 and stent duplex reveals patent stent Left Lower Extremity: The left common femoral profundofemoral artery are small in caliber but widely patent.  The superficial femoral artery is patent however just proximal to the adductor canal there is a 90% short segment stenosis.  Popliteal artery is patent throughout its course with three-vessel runoff.  There is diffuse disease out onto the foot. Post angiogram: Left Lower Extremity: The left common femoral profundofemoral artery are small in caliber but widely patent.  The superficial femoral artery is patent however just proximal to the adductor canal there is a 90% short segment stenosis.  Popliteal artery is patent throughout its course with three-vessel runoff.  There is diffuse disease out onto the foot.  PLAN: I have encouraged him to stop smoking, walk for exercise and keep a close eye on the ulcer area.  I suggested he stop using peroxide daily, but he can use triple antibiotics topical if he likes.  Wash with soap and water daily.  F/U in 6 months for repeat surveillance ABII and Duplex.     Mosetta Pigeon PA-C [facility] Office: [phone]  MD in clinic [place]

## 2020-12-30 ENCOUNTER — Other Ambulatory Visit: Payer: Self-pay

## 2020-12-30 DIAGNOSIS — I739 Peripheral vascular disease, unspecified: Secondary | ICD-10-CM

## 2021-01-11 ENCOUNTER — Telehealth: Payer: Self-pay

## 2021-01-11 NOTE — Telephone Encounter (Signed)
Patient calls today to report that his left toe ulcers are exquisitely painful and not healing. He can't sleep, can't stand, can't walk without pain. His foot is warm. Says the pain is the same from the visit on 12/29/20. He is s/p left SFA stent on 12/14/20. Discussed with provider, patient has had recent studies - so placed on PA schedule for wound check.

## 2021-01-12 ENCOUNTER — Other Ambulatory Visit: Payer: Self-pay

## 2021-01-12 ENCOUNTER — Encounter: Payer: Self-pay | Admitting: Legal Medicine

## 2021-01-12 ENCOUNTER — Ambulatory Visit: Payer: PPO

## 2021-01-12 ENCOUNTER — Ambulatory Visit (INDEPENDENT_AMBULATORY_CARE_PROVIDER_SITE_OTHER): Payer: PPO | Admitting: Legal Medicine

## 2021-01-12 VITALS — BP 122/74 | HR 90 | Temp 97.6°F | Ht 70.0 in | Wt 141.2 lb

## 2021-01-12 DIAGNOSIS — E782 Mixed hyperlipidemia: Secondary | ICD-10-CM

## 2021-01-12 DIAGNOSIS — I10 Essential (primary) hypertension: Secondary | ICD-10-CM

## 2021-01-12 DIAGNOSIS — E0840 Diabetes mellitus due to underlying condition with diabetic neuropathy, unspecified: Secondary | ICD-10-CM | POA: Diagnosis not present

## 2021-01-12 MED ORDER — GABAPENTIN 300 MG PO CAPS
300.0000 mg | ORAL_CAPSULE | Freq: Three times a day (TID) | ORAL | 3 refills | Status: DC
Start: 2021-01-12 — End: 2021-05-17

## 2021-01-12 NOTE — Patient Instructions (Signed)
Stop plavix Take xarelto 2.5 mg BID

## 2021-01-12 NOTE — Progress Notes (Signed)
Acute Office Visit  Subjective:    Patient ID: Andrew Wiggins, male    DOB: [birth year], 65 y.o.   MRN: [id number]  Chief Complaint  Patient presents with   Numbness    Left Foot pain, numbness, soreness between toes. They took the blockage out but still not better and put a splint in. Patient states he is still in so much pain he can't do anything. Patient states his 4th toe is swollen still.    HPI Patient is in today for follow up angioplasty and stent .  He had a normal ultrasound. He still gets foot spasms at night..he cancelled his recent appointment.  Procedure from 12/20/2020 Intervention: After the above images were acquired the decision was made to proceed with intervention.  A 6 French sheath was advanced into the left superficial femoral artery.  The patient was fully heparinized.  I then used a V-18 wire with the support of a 3 x 40 Sterling balloon.  I was able to easily cross the lesion and performed primary balloon angioplasty at nominal pressure for 2 minutes.  Completion imaging showed improvement in the stenosis however there was a small dissection flap.  I reinserted the balloon and repeated a low inflation prolonged angioplasty.  On completion there was still a residual dissection.  It was not flow-limiting however I did not want to leave this.  Also did not really want to put a stent in because of the small size of his vessels however I ultimately elected to place a 5 x 60 INNOVA stent which was postdilated with a 4 mm balloon.  Completion imaging showed resolution of the dissection and stenosis.  There was much more brisk flow down to the foot.  His runoff was unchanged.  At this point catheters and wires were removed.  The groin was closed using the CELT device without complication   Impression:             #1  Very small caliber vessels throughout             #2  Focal short segment high-grade lesion within the left superficial femoral artery initially treated with primary  balloon angioplasty with a dissection which was covered using a 5 x 60 INNOVA stent with complete resolution             #3  Patient will be placed on IV antibiotics, aspirin, statin, and Plavix.  He will return to the offfice 2 weeks  Patient present with type 2 diabetes.  Specifically, this is type 2, noninsulin requiring diabetes, complicated by neuropathy.  Compliance with treatment has been good; patient take medicines as directed, maintains diet and exercise regimen, follows up as directed, and is keeping glucose diary.  Date of  diagnosis 2010.  Depression screen has been performed.Tobacco screen nonsmoker. Current medicines for diabetes amaryl, metformin.  Patient is on lisinopril for renal protection and atorvastatin for cholesterol control.  Patient performs foot exams daily and last ophthalmologic exam was yes.   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 ~  Jan11:  Last seen by Cards & pacer reprogrammed...     Cardiac pacemaker in situ  Carotid artery disease (HCC) 07/27/2010   R/O PERIPHERAL VASCULAR DISEASE (ICD-443.9) - on ASA 81mg /d... exam 11/09 shows gr 1-2 sys murmur LSB, but also has prominent bruit to base of right side of his neck w/ right > left Carotid Bruit... ~  CDopplers 11/09 showed severe plaque right ICA, & min plaque on left... no signif ICA stenoses per report. 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 smokes 1/2 - 1ppd x yrs... ~  PFT's 11/08  showed FVC= 3.85 (79%), FEV1= 2.19 (55%), %1sec= 57, mid-flows= 32% predicted:  all c/w mod airflow obstruction...  ~  CXR 12/10 showed left subclav pacer, mild hyperinflation, NAD...     Dermatitis, contact    due to poison ivy   Diabetes mellitus with neuropathy (HCC) 12/20/2016   Dyslipidemia 03/06/2012   Essential hypertension 04/10/2007    HYPERTENSION (ICD-401.9) - prev on Micardis 80mg /d then switched to Losartan 100mg /d, but he lost his job in 2011& couldn't afford Rx;  He called 14/07/2006 2/12 w/ this news & we phoned in Lisinopril 20mg /d... ~  2DEcho 11/09 showed sl incr LVwall thickness, norm LVF w/ EF= 55-60%, no regional wall motion abn, mild DD, mild MR... ~  Myoview 12/09 showed LBBB, no perfusion defects, abn septal motion & EF   GERD 03/26/2008    GERD (ICD-530.81) - prev mild GERD symptoms that improved after cholecystectomy on 2000... uses OTC PPI as needed... ~  he is due for routine screening colonoscopy...       GERD (gastroesophageal reflux disease)    Headache(784.0)    Hypertension    Mixed hyperlipidemia 06/19/2019   Peripheral vascular disease (HCC)    Sciatica 11/18/2019   Tobacco use disorder     Past Surgical History:  Procedure Laterality Date   ABDOMINAL AORTOGRAM W/LOWER EXTREMITY N/A 12/14/2020   Procedure: ABDOMINAL AORTOGRAM W/LOWER EXTREMITY;  Surgeon: 2001, MD;  Location: [facility];  Service: Cardiovascular;  Laterality: N/A;   laproscopic cholecystectomy  2000   by Dr. 08/17/2019   PACEMAKER INSERTION  03/2008   by Dr. 02/13/2021   PERIPHERAL VASCULAR INTERVENTION Left 12/14/2020   Procedure: PERIPHERAL VASCULAR INTERVENTION;  Surgeon: Odie Sera, MD;  Location: [facility];  Service: Cardiovascular;  Laterality: Left;  SFA    Family History  Problem Relation Age of Onset   Diabetes Brother     Social History   Socioeconomic History   Marital status: Legally Separated    Spouse name: Not on file   Number of children: 2    Years of education: Not on file   Highest education level: Not on file  Occupational History   Occupation: unemployed    Comment: works for himself  Tobacco Use   Smoking status: Every Day    Packs/day: 1.00    Years: 25.00    Pack years: 25.00    Types: Cigarettes   Smokeless tobacco: Never  Vaping Use   Vaping Use: Never used  Substance and Sexual Activity   Alcohol use: Not Currently    Alcohol/week: 0.0 standard drinks   Drug use: No   Sexual activity: Not Currently  Other Topics Concern   Not on file  Social History Narrative   2 siblings died with leukemia around age 13  Social Determinants of Health   Financial Resource Strain: Low Risk    Difficulty of Paying Living Expenses: Not hard at all  Food Insecurity: No Food Insecurity   Worried About Programme researcher, broadcasting/film/video in the Last Year: Never true   Ran Out of Food in the Last Year: Never true  Transportation Needs: No Transportation Needs   Lack of Transportation (Medical): No   Lack of Transportation (Non-Medical): No  Physical Activity: Inactive   Days of Exercise per Week: 0 days   Minutes of Exercise per Session: 30 min  Stress: Stress Concern Present   Feeling of Stress : To some extent  Social Connections: Socially Isolated   Frequency of Communication with Friends and Family: Three times a week   Frequency of Social Gatherings with Friends and Family: Never   Attends Religious Services: Never   Database administrator or Organizations: No   Attends Engineer, structural: Never   Marital Status: Divorced  Catering manager Violence: Not At Risk   Fear of Current or Ex-Partner: No   Emotionally Abused: No   Physically Abused: No   Sexually Abused: No    Outpatient Medications Prior to Visit  Medication Sig Dispense Refill   amLODipine (NORVASC) 5 MG tablet Take 1 tablet (5 mg total) by mouth daily. 180 tablet 3   aspirin EC 81 MG tablet Take 1 tablet (81 mg total) by mouth daily. Swallow whole.  150 tablet 2   Aspirin-Acetaminophen-Caffeine (GOODY HEADACHE PO) Take 1 Package by mouth daily as needed (headache).     atorvastatin (LIPITOR) 20 MG tablet Take 1 tablet (20 mg total) by mouth daily. 90 tablet 3   glimepiride (AMARYL) 4 MG tablet Take 1 tablet by mouth once daily 90 tablet 2   hydrochlorothiazide (MICROZIDE) 12.5 MG capsule Take 1 capsule (12.5 mg total) by mouth daily. 90 capsule 3   HYDROcodone-acetaminophen (NORCO) 10-325 MG tablet Take 1 tablet by mouth 3 (three) times daily.     lisinopril (ZESTRIL) 20 MG tablet TAKE 1 TABLET BY MOUTH IN THE MORNING AND AT BEDTIME 180 tablet 0   metFORMIN (GLUCOPHAGE-XR) 500 MG 24 hr tablet TAKE 1 TABLET BY MOUTH ONCE DAILY WITH EVENING MEAL 90 tablet 2   clopidogrel (PLAVIX) 75 MG tablet Take 1 tablet (75 mg total) by mouth daily. 30 tablet 11   rosuvastatin (CRESTOR) 10 MG tablet Take 1 tablet (10 mg total) by mouth daily. 30 tablet 11   No facility-administered medications prior to visit.    No Known Allergies  Review of Systems  Constitutional:  Negative for chills, fatigue and fever.  HENT:  Negative for congestion, ear pain and sore throat.   Respiratory:  Negative for cough and shortness of breath.   Cardiovascular:  Negative for chest pain.  Gastrointestinal:  Negative for abdominal pain, constipation, diarrhea, nausea and vomiting.  Endocrine: Negative for polydipsia, polyphagia and polyuria.  Genitourinary:  Negative for dysuria and frequency.  Musculoskeletal:  Negative for arthralgias and myalgias.  Neurological:  Negative for dizziness and headaches.  Psychiatric/Behavioral:  Negative for dysphoric mood.        No dysphoria      Objective:    Physical Exam Vitals reviewed.  Constitutional:      Appearance: Normal appearance.  HENT:     Head: Normocephalic and atraumatic.     Right Ear: Tympanic membrane, ear canal and external ear normal.     Left Ear: Tympanic membrane, ear canal and external ear  normal.      Mouth/Throat:     Mouth: Mucous membranes are moist.     Pharynx: Oropharynx is clear.  Eyes:     Extraocular Movements: Extraocular movements intact.     Conjunctiva/sclera: Conjunctivae normal.     Pupils: Pupils are equal, round, and reactive to light.  Cardiovascular:     Rate and Rhythm: Normal rate and regular rhythm.     Pulses: Normal pulses.     Heart sounds: Normal heart sounds. No murmur heard.   No gallop.  Pulmonary:     Effort: Pulmonary effort is normal. No respiratory distress.     Breath sounds: Normal breath sounds. No wheezing.  Abdominal:     General: Abdomen is flat. Bowel sounds are normal. There is no distension.     Palpations: Abdomen is soft.     Tenderness: There is no abdominal tenderness.  Musculoskeletal:        General: Normal range of motion.     Cervical back: Normal range of motion.  Skin:    General: Skin is warm.     Capillary Refill: Capillary refill takes less than 2 seconds.  Neurological:     General: No focal deficit present.     Mental Status: He is alert and oriented to person, place, and time. Mental status is at baseline.  Psychiatric:        Mood and Affect: Mood normal.    BP 122/74   Pulse 90   Temp 97.6 F (36.4 C)   Ht  (1.778 m)   Wt 141 lb 3.2 oz (64 kg)   SpO2 98%   BMI 20.26 kg/m  Wt Readings from Last 3 Encounters:  01/12/21 141 lb 3.2 oz (64 kg)  12/29/20 142 lb 8 oz (64.6 kg)  12/14/20 142 lb (64.4 kg)    Health Maintenance Due  Topic Date Due   COVID-19 Vaccine (1) Never done   Hepatitis C Screening  Never done   TETANUS/TDAP  Never done   COLONOSCOPY (Pts 45-35yrs Insurance coverage will need to be confirmed)  Never done   Zoster Vaccines- Shingrix (1 of 2) Never done   PNA vac Low Risk Adult (1 of 2 - PCV13) 12/21/2019   HEMOGLOBIN A1C  05/20/2020   FOOT EXAM  11/17/2020   INFLUENZA VACCINE  12/06/2020    There are no preventive care reminders to display for this patient.   Lab Results   Component Value Date   TSH 1.46 12/20/2017   Lab Results  Component Value Date   WBC 10.4 11/18/2019   HGB 16.0 12/14/2020   HCT 47.0 12/14/2020   MCV 91 11/18/2019   PLT 267 11/18/2019   Lab Results  Component Value Date   NA 138 12/14/2020   K 3.9 12/14/2020   CO2 23 11/18/2019   GLUCOSE 191 (H) 12/14/2020   BUN 15 12/14/2020   CREATININE 0.70 12/14/2020   BILITOT 0.5 11/18/2019   ALKPHOS 98 11/18/2019   AST 14 11/18/2019   ALT 14 11/18/2019   PROT 6.8 11/18/2019   ALBUMIN 4.3 11/18/2019   CALCIUM 9.5 11/18/2019   GFR 83.08 12/20/2017   Lab Results  Component Value Date   CHOL 150 11/18/2019   Lab Results  Component Value Date   HDL 33 (L) 11/18/2019   Lab Results  Component Value Date   LDLCALC 97 11/18/2019   Lab Results  Component Value Date   TRIG 108 11/18/2019   Lab Results  Component Value Date   CHOLHDL 4.5 11/18/2019   Lab Results  Component Value Date   HGBA1C 7.3 (H) 11/18/2019       Assessment & Plan:  1. Diabetes mellitus due to underlying condition with diabetic neuropathy, unspecified whether long term insulin use (HCC) - gabapentin (NEURONTIN) 300 MG capsule; Take 1 capsule (300 mg total) by mouth 3 (three) times daily.  Dispense: 90 capsule; Refill: 3 - Hemoglobin A1c An individual care plan for diabetes was established and reinforced today.  The patient's status was assessed using clinical findings on exam, labs and diagnostic testing. Patient success at meeting goals based on disease specific evidence-based guidelines and found to be good controlled. Medications were assessed and patient's understanding of the medical issues , including barriers were assessed. Recommend adherence to a diabetic diet, a graduated exercise program, HgbA1c level is checked quarterly, and urine microalbumin performed yearly .  Annual mono-filament sensation testing performed. Lower blood pressure and control hyperlipidemia is important. Get annual eye  exams and annual flu shots and smoking cessation discussed.  Self management goals were discussed.   2. Mixed hyperlipidemia - Lipid panel AN INDIVIDUAL CARE PLAN for hyperlipidemia/ cholesterol was established and reinforced today.  The patient's status was assessed using clinical findings on exam, lab and other diagnostic tests. The patient's disease status was assessed based on evidence-based guidelines and found to be well controlled. MEDICATIONS were reviewed. SELF MANAGEMENT GOALS have been discussed and patient's success at attaining the goal of low cholesterol was assessed. RECOMMENDATION given include regular exercise 3 days a week and low cholesterol/low fat diet. CLINICAL SUMMARY including written plan to identify barriers unique to the patient due to social or economic  reasons was discussed.   3. Essential hypertension - CBC with Differential/Platelet - Comprehensive metabolic panel  An individual hypertension care plan was established and reinforced today.  The patient's status was assessed using clinical findings on exam and labs or diagnostic tests. The patient's success at meeting treatment goals on disease specific evidence-based guidelines and found to be well controlled. SELF MANAGEMENT: The patient and I together assessed ways to personally work towards obtaining the recommended goals. RECOMMENDATIONS: avoid decongestants found in common cold remedies, decrease consumption of alcohol, perform routine monitoring of BP with home BP cuff, exercise, reduction of dietary salt, take medicines as prescribed, try not to miss doses and quit smoking.  Regular exercise and maintaining a healthy weight is needed.  Stress reduction may help. A CLINICAL SUMMARY including written plan identify barriers to care unique to individual due to social or financial issues.  We attempt to mutually creat solutions for individual and family understanding.   Meds ordered this encounter  Medications    gabapentin (NEURONTIN) 300 MG capsule    Sig: Take 1 capsule (300 mg total) by mouth 3 (three) times daily.    Dispense:  90 capsule    Refill:  3    Orders Placed This Encounter  Procedures   CBC with Differential/Platelet   Comprehensive metabolic panel   Hemoglobin A1c   Lipid panel     I spent 20 minutes dedicated to the care of this patient on the date of this encounter to include face-to-face time with the patient, as well as:  Follow-up: Return in about 2 weeks (around 01/26/2021) for pvd.  An After Visit Summary was printed and given to the patient.  Brent BullaLawrence Aneisa Karren, MD [facility] [phone]

## 2021-01-13 LAB — CBC WITH DIFFERENTIAL/PLATELET
Basophils Absolute: 0 10*3/uL (ref 0.0–0.2)
Basos: 0 %
EOS (ABSOLUTE): 0.1 10*3/uL (ref 0.0–0.4)
Eos: 1 %
Hematocrit: 41.2 % (ref 37.5–51.0)
Hemoglobin: 14.1 g/dL (ref 13.0–17.7)
Immature Grans (Abs): 0 10*3/uL (ref 0.0–0.1)
Immature Granulocytes: 0 %
Lymphocytes Absolute: 3.4 10*3/uL — ABNORMAL HIGH (ref 0.7–3.1)
Lymphs: 29 %
MCH: 32 pg (ref 26.6–33.0)
MCHC: 34.2 g/dL (ref 31.5–35.7)
MCV: 94 fL (ref 79–97)
Monocytes Absolute: 0.9 10*3/uL (ref 0.1–0.9)
Monocytes: 7 %
Neutrophils Absolute: 7.4 10*3/uL — ABNORMAL HIGH (ref 1.4–7.0)
Neutrophils: 63 %
Platelets: 206 10*3/uL (ref 150–450)
RBC: 4.4 x10E6/uL (ref 4.14–5.80)
RDW: 12.3 % (ref 11.6–15.4)
WBC: 11.7 10*3/uL — ABNORMAL HIGH (ref 3.4–10.8)

## 2021-01-13 LAB — LIPID PANEL
Chol/HDL Ratio: 3.3 ratio (ref 0.0–5.0)
Cholesterol, Total: 88 mg/dL — ABNORMAL LOW (ref 100–199)
HDL: 27 mg/dL — ABNORMAL LOW (ref >39)
LDL Chol Calc (NIH): 32 mg/dL (ref 0–99)
Triglycerides: 175 mg/dL — ABNORMAL HIGH (ref 0–149)
VLDL Cholesterol Cal: 29 mg/dL (ref 5–40)

## 2021-01-13 LAB — COMPREHENSIVE METABOLIC PANEL
ALT: 17 IU/L (ref 0–44)
AST: 17 IU/L (ref 0–40)
Albumin/Globulin Ratio: 1.8 (ref 1.2–2.2)
Albumin: 4.3 g/dL (ref 3.8–4.8)
Alkaline Phosphatase: 103 IU/L (ref 44–121)
BUN/Creatinine Ratio: 11 (ref 10–24)
BUN: 10 mg/dL (ref 8–27)
Bilirubin Total: 0.3 mg/dL (ref 0.0–1.2)
CO2: 25 mmol/L (ref 20–29)
Calcium: 9.9 mg/dL (ref 8.6–10.2)
Chloride: 98 mmol/L (ref 96–106)
Creatinine, Ser: 0.94 mg/dL (ref 0.76–1.27)
Globulin, Total: 2.4 g/dL (ref 1.5–4.5)
Glucose: 262 mg/dL — ABNORMAL HIGH (ref 65–99)
Potassium: 3.8 mmol/L (ref 3.5–5.2)
Sodium: 137 mmol/L (ref 134–144)
Total Protein: 6.7 g/dL (ref 6.0–8.5)
eGFR: 89 mL/min/{1.73_m2} (ref >59)

## 2021-01-13 LAB — CARDIOVASCULAR RISK ASSESSMENT

## 2021-01-13 LAB — HEMOGLOBIN A1C
Est. average glucose Bld gHb Est-mCnc: 189 mg/dL
Hgb A1c MFr Bld: 8.2 % — ABNORMAL HIGH (ref 4.8–5.6)

## 2021-01-13 NOTE — Progress Notes (Signed)
Wbc 11.7 any infection?, glucose 262, kidney tests normal, liver tests normal, A1c 8.2  need < 8, I suggest adding on rebelsus to lower A1c and watch diet, triglycerides high 175- diet is key lp

## 2021-01-17 ENCOUNTER — Encounter (HOSPITAL_COMMUNITY): Payer: PPO

## 2021-01-19 ENCOUNTER — Ambulatory Visit: Payer: PPO | Admitting: Cardiology

## 2021-01-19 ENCOUNTER — Other Ambulatory Visit: Payer: Self-pay

## 2021-01-19 ENCOUNTER — Encounter: Payer: Self-pay | Admitting: Cardiology

## 2021-01-19 VITALS — BP 111/68 | HR 112 | Ht 70.0 in | Wt 142.0 lb

## 2021-01-19 DIAGNOSIS — E1169 Type 2 diabetes mellitus with other specified complication: Secondary | ICD-10-CM | POA: Diagnosis not present

## 2021-01-19 DIAGNOSIS — I739 Peripheral vascular disease, unspecified: Secondary | ICD-10-CM | POA: Diagnosis not present

## 2021-01-19 DIAGNOSIS — Z720 Tobacco use: Secondary | ICD-10-CM

## 2021-01-19 DIAGNOSIS — I10 Essential (primary) hypertension: Secondary | ICD-10-CM

## 2021-01-19 DIAGNOSIS — E782 Mixed hyperlipidemia: Secondary | ICD-10-CM

## 2021-01-19 DIAGNOSIS — I4430 Unspecified atrioventricular block: Secondary | ICD-10-CM

## 2021-01-19 MED ORDER — LISINOPRIL 20 MG PO TABS: ORAL_TABLET | ORAL | 3 refills | Status: DC

## 2021-01-19 MED ORDER — HYDROCHLOROTHIAZIDE 12.5 MG PO CAPS: 12.5000 mg | ORAL_CAPSULE | Freq: Every day | ORAL | 3 refills | Status: DC

## 2021-01-19 NOTE — Progress Notes (Signed)
Cardiology Office Note:    Date:  01/19/2021   ID:  Andrew Wiggins, DOB 1955-08-11, MRN [id number]  PCP:  Abigail Miyamoto, MD  Cardiologist:  Thomasene Ripple, DO  Electrophysiologist:  Regan Lemming, MD   Referring MD: Abigail Miyamoto,*   " I am  doing fine"  History of Present Illness:    Andrew Wiggins is a 65 y.o. male with a hx of complete heart block status post pacemaker, hypertension (will also concern for whitecoat hypertension as noted in his chart), COPD, peripheral artery disease status post left superficial femoral artery stent which was done in August 2022.   I saw the patient in February 04, 2020 at that time he appeared to be doing well from a cardiovascular standpoint.  He was finally orthopedic surgery.  At that time he was cleared for surgery.   At his visit on August 04, 2020 he was hypertensive I increase his amlodipine to 5 mg daily.  He continue his lisinopril 20 mg twice a day.  He was planning for cataract surgery and we cleared the patient.    I saw the patient on October 06, 2020 at that time he presented for follow-up visit.  He had just had cataract surgery.  During that visit he was hypertensive I added hydrochlorothiazide to his medication regimen. He reports that since his visit he has had his procedure with vascular surgery.  He is recovering slowly. His blood pressure has improved and he stopped taking the amlodipine and only takes lisinopril and hydrochlorothiazide at this time.  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 ~  Jan11:  Last seen by Cards & pacer reprogrammed...     Cardiac pacemaker in situ    Carotid artery disease (HCC) 07/27/2010   R/O PERIPHERAL VASCULAR  DISEASE (ICD-443.9) - on ASA 81mg /d... exam 11/09 shows gr 1-2 sys murmur LSB, but also has prominent bruit to base of right side of his neck w/ right > left Carotid Bruit... ~  CDopplers 11/09 showed severe plaque right ICA, & min plaque on left... no signif ICA stenoses per report. 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 smokes 1/2 - 1ppd x yrs... ~  PFT's 11/08 showed FVC= 3.85 (79%), FEV1= 2.19 (55%), %1sec= 57, mid-flows= 32% predicted:  all c/w mod airflow obstruction...  ~  CXR 12/10 showed left subclav pacer, mild hyperinflation, NAD...     Dermatitis, contact    due to poison ivy   Diabetes mellitus with neuropathy (HCC) 12/20/2016   Dyslipidemia 03/06/2012   Essential hypertension 04/10/2007    HYPERTENSION (ICD-401.9) - prev on Micardis 80mg /d then switched to Losartan 100mg /d, but he lost his job in 2011& couldn't afford Rx;  He called 14/07/2006 2/12 w/ this news & we phoned in Lisinopril 20mg /d... ~  2DEcho 11/09 showed sl incr LVwall thickness, norm LVF w/ EF= 55-60%, no regional wall motion abn, mild DD, mild MR... ~  Myoview 12/09 showed LBBB, no perfusion defects, abn septal  motion & EF   GERD 03/26/2008    GERD (ICD-530.81) - prev mild GERD symptoms that improved after cholecystectomy on 2000... uses OTC PPI as needed... ~  he is due for routine screening colonoscopy...       GERD (gastroesophageal reflux disease)    Headache(784.0)    Hypertension    Mixed hyperlipidemia 06/19/2019   Peripheral vascular disease (HCC)    Sciatica 11/18/2019   Tobacco use disorder     Past Surgical History:  Procedure Laterality Date   ABDOMINAL AORTOGRAM W/LOWER EXTREMITY N/A 12/14/2020   Procedure: ABDOMINAL AORTOGRAM W/LOWER EXTREMITY;  Surgeon: Nada Libman, MD;   Location: [facility];  Service: Cardiovascular;  Laterality: N/A;   laproscopic cholecystectomy  2000   by Dr. Odie Sera   PACEMAKER INSERTION  03/2008   by Dr. Ladona Ridgel   PERIPHERAL VASCULAR INTERVENTION Left 12/14/2020   Procedure: PERIPHERAL VASCULAR INTERVENTION;  Surgeon: Nada Libman, MD;  Location: [facility];  Service: Cardiovascular;  Laterality: Left;  SFA    Current Medications: Current Meds  Medication Sig   aspirin EC 81 MG tablet Take 1 tablet (81 mg total) by mouth daily. Swallow whole.   Aspirin-Acetaminophen-Caffeine (GOODY HEADACHE PO) Take 1 Package by mouth daily as needed (headache).   atorvastatin (LIPITOR) 20 MG tablet Take 20 mg by mouth at bedtime.   gabapentin (NEURONTIN) 300 MG capsule Take 1 capsule (300 mg total) by mouth 3 (three) times daily.   glimepiride (AMARYL) 4 MG tablet Take 1 tablet by mouth once daily   HYDROcodone-acetaminophen (NORCO) 10-325 MG tablet Take 1 tablet by mouth 3 (three) times daily.   metFORMIN (GLUCOPHAGE-XR) 500 MG 24 hr tablet TAKE 1 TABLET BY MOUTH ONCE DAILY WITH EVENING MEAL   [DISCONTINUED] hydrochlorothiazide (MICROZIDE) 12.5 MG capsule Take 1 capsule (12.5 mg total) by mouth daily.   [DISCONTINUED] lisinopril (ZESTRIL) 20 MG tablet TAKE 1 TABLET BY MOUTH IN THE MORNING AND AT BEDTIME     Allergies:   Patient has no known allergies.   Social History   Socioeconomic History   Marital status: Legally Separated    Spouse name: Not on file   Number of children: 2   Years of education: Not on file   Highest education level: Not on file  Occupational History   Occupation: unemployed    Comment: works for himself  Tobacco Use   Smoking status: Every Day    Packs/day: 1.00    Years: 25.00    Pack years: 25.00    Types: Cigarettes   Smokeless tobacco: Never  Vaping Use   Vaping Use: Never used  Substance and Sexual Activity   Alcohol use: Not Currently    Alcohol/week: 0.0 standard drinks   Drug  use: No   Sexual activity: Not Currently  Other Topics Concern   Not on file  Social History Narrative   2 siblings died with leukemia around age 50   Social Determinants of Health   Financial Resource Strain: Low Risk    Difficulty of Paying Living Expenses: Not hard at all  Food Insecurity: No Food Insecurity   Worried About Programme researcher, broadcasting/film/video in the Last Year: Never true   Barista in the Last Year: Never true  Transportation Needs: No Transportation Needs   Lack of Transportation (Medical): No   Lack of Transportation (Non-Medical): No  Physical Activity: Inactive   Days of Exercise per Week: 0 days   Minutes of Exercise  per Session: 30 min  Stress: Stress Concern Present   Feeling of Stress : To some extent  Social Connections: Socially Isolated   Frequency of Communication with Friends and Family: Three times a week   Frequency of Social Gatherings with Friends and Family: Never   Attends Religious Services: Never   Database administrator or Organizations: No   Attends Engineer, structural: Never   Marital Status: Divorced     Family History: The patient's family history includes Diabetes in his brother.  ROS:   Review of Systems  Constitution: Negative for decreased appetite, fever and weight gain.  HENT: Negative for congestion, ear discharge, hoarse voice and sore throat.   Eyes: Negative for discharge, redness, vision loss in right eye and visual halos.  Cardiovascular: Negative for chest pain, dyspnea on exertion, leg swelling, orthopnea and palpitations.  Respiratory: Negative for cough, hemoptysis, shortness of breath and snoring.   Endocrine: Negative for heat intolerance and polyphagia.  Hematologic/Lymphatic: Negative for bleeding problem. Does not bruise/bleed easily.  Skin: Negative for flushing, nail changes, rash and suspicious lesions.  Musculoskeletal: Negative for arthritis, joint pain, muscle cramps, myalgias, neck pain and  stiffness.  Gastrointestinal: Negative for abdominal pain, bowel incontinence, diarrhea and excessive appetite.  Genitourinary: Negative for decreased libido, genital sores and incomplete emptying.  Neurological: Negative for brief paralysis, focal weakness, headaches and loss of balance.  Psychiatric/Behavioral: Negative for altered mental status, depression and suicidal ideas.  Allergic/Immunologic: Negative for HIV exposure and persistent infections.    EKGs/Labs/Other Studies Reviewed:    The following studies were reviewed today:   EKG: None today  Recent Labs: 01/12/2021: ALT 17; BUN 10; Creatinine, Ser 0.94; Hemoglobin 14.1; Platelets 206; Potassium 3.8; Sodium 137  Recent Lipid Panel    Component Value Date/Time   CHOL 88 (L) 01/12/2021 1100   TRIG 175 (H) 01/12/2021 1100   HDL 27 (L) 01/12/2021 1100   CHOLHDL 3.3 01/12/2021 1100   CHOLHDL 5 12/20/2017 1320   VLDL 44.0 (H) 12/20/2017 1320   LDLCALC 32 01/12/2021 1100   LDLDIRECT 113.0 12/20/2017 1320    Physical Exam:    VS:  BP 111/68 (BP Location: Right Arm, Patient Position: Sitting, Cuff Size: Normal)   Pulse (!) 112   Ht 5\' 10"  (1.778 m)   Wt 142 lb (64.4 kg)   SpO2 97%   BMI 20.37 kg/m     Wt Readings from Last 3 Encounters:  01/19/21 142 lb (64.4 kg)  01/12/21 141 lb 3.2 oz (64 kg)  12/29/20 142 lb 8 oz (64.6 kg)     GEN: Well nourished, well developed in no acute distress HEENT: Normal NECK: No JVD; No carotid bruits LYMPHATICS: No lymphadenopathy CARDIAC: S1S2 noted,RRR, no murmurs, rubs, gallops RESPIRATORY:  Clear to auscultation without rales, wheezing or rhonchi  ABDOMEN: Soft, non-tender, non-distended, +bowel sounds, no guarding. EXTREMITIES: No edema, No cyanosis, no clubbing MUSCULOSKELETAL:  No deformity  SKIN: Warm and dry NEUROLOGIC:  Alert and oriented x 3, non-focal PSYCHIATRIC:  Normal affect, good insight  ASSESSMENT:    1. Essential hypertension   2. Type 2 diabetes mellitus  with other specified complication, without long-term current use of insulin (HCC)   3. PVD (peripheral vascular disease) (HCC)   4. AV block   5. Mixed hyperlipidemia   6. Tobacco use    PLAN:     1.  His blood pressure is acceptable in the office today.  We will keep the patient on the  lisinopril 20 mg twice a day and his hydrochlorothiazide 12.5 mg daily.  He has been off amlodipine his blood pressure is fine we will keep him on his current regimen.  Continue his statin.  In light of his peripheral artery disease he was recently placed on aspirin continue on the aspirin and statin.  Smoking cessation advised.  The patient is in agreement with the above plan. The patient left the office in stable condition.  The patient will follow up in 1 year   Medication Adjustments/Labs and Tests Ordered: Current medicines are reviewed at length with the patient today.  Concerns regarding medicines are outlined above.  No orders of the defined types were placed in this encounter.  Meds ordered this encounter  Medications   lisinopril (ZESTRIL) 20 MG tablet    Sig: TAKE 1 TABLET BY MOUTH IN THE MORNING AND AT BEDTIME    Dispense:  180 tablet    Refill:  3   hydrochlorothiazide (MICROZIDE) 12.5 MG capsule    Sig: Take 1 capsule (12.5 mg total) by mouth daily.    Dispense:  90 capsule    Refill:  3    Patient Instructions  Medication Instructions:  Your physician recommends that you continue on your current medications as directed. Please refer to the Current Medication list given to you today.  *If you need a refill on your cardiac medications before your next appointment, please call your pharmacy*   Lab Work: None If you have labs (blood work) drawn today and your tests are completely normal, you will receive your results only by: MyChart Message (if you have MyChart) OR A paper copy in the mail If you have any lab test that is abnormal or we need to change your treatment, we will  call you to review the results.   Testing/Procedures: None   Follow-Up: At [facility], you and your health needs are our priority.  As part of our continuing mission to provide you with exceptional heart care, we have created designated Provider Care Teams.  These Care Teams include your primary Cardiologist (physician) and Advanced Practice Providers (APPs -  Physician Assistants and Nurse Practitioners) who all work together to provide you with the care you need, when you need it.  We recommend signing up for the patient portal called "MyChart".  Sign up information is provided on this After Visit Summary.  MyChart is used to connect with patients for Virtual Visits (Telemedicine).  Patients are able to view lab/test results, encounter notes, upcoming appointments, etc.  Non-urgent messages can be sent to your provider as well.   To learn more about what you can do with MyChart, go to [email or web address].    Your next appointment:   1 year(s)  The format for your next appointment:   In Person  Provider:   Belva Crome, MD   Other Instructions    Adopting a Healthy Lifestyle.  Know what a healthy weight is for you (roughly BMI <25) and aim to maintain this   Aim for 7+ servings of fruits and vegetables daily   65-80+ fluid ounces of water or unsweet tea for healthy kidneys   Limit to max 1 drink of alcohol per day; avoid smoking/tobacco   Limit animal fats in diet for cholesterol and heart health - choose grass fed whenever available   Avoid highly processed foods, and foods high in saturated/trans fats   Aim for low stress - take time to unwind and care for your mental health  Aim for 150 min of moderate intensity exercise weekly for heart health, and weights twice weekly for bone health   Aim for 7-9 hours of sleep daily   When it comes to diets, agreement about the perfect plan isnt easy to find, even among the experts. Experts at the [facility]  [employer] developed an idea known as the Healthy Eating Plate. Just imagine a plate divided into logical, healthy portions.   The emphasis is on diet quality:   Load up on vegetables and fruits - one-half of your plate: Aim for color and variety, and remember that potatoes dont count.   Go for whole grains - one-quarter of your plate: Whole wheat, barley, wheat berries, quinoa, oats, brown rice, and foods made with them. If you want pasta, go with whole wheat pasta.   Protein power - one-quarter of your plate: Fish, chicken, beans, and nuts are all healthy, versatile protein sources. Limit red meat.   The diet, however, does go beyond the plate, offering a few other suggestions.   Use healthy plant oils, such as olive, canola, soy, corn, sunflower and peanut. Check the labels, and avoid partially hydrogenated oil, which have unhealthy trans fats.   If youre thirsty, drink water. Coffee and tea are good in moderation, but skip sugary drinks and limit milk and dairy products to one or two daily servings.   The type of carbohydrate in the diet is more important than the amount. Some sources of carbohydrates, such as vegetables, fruits, whole grains, and beans-are healthier than others.   Finally, stay active  Signed, Thomasene Ripple, DO  01/19/2021 3:14 PM    [facility]

## 2021-01-19 NOTE — Patient Instructions (Signed)
Medication Instructions:  °Your physician recommends that you continue on your current medications as directed. Please refer to the Current Medication list given to you today. ° °*If you need a refill on your cardiac medications before your next appointment, please call your pharmacy* ° ° °Lab Work: °None °If you have labs (blood work) drawn today and your tests are completely normal, you will receive your results only by: °MyChart Message (if you have MyChart) OR °A paper copy in the mail °If you have any lab test that is abnormal or we need to change your treatment, we will call you to review the results. ° ° °Testing/Procedures: °None ° ° °Follow-Up: °At [facility], you and your health needs are our priority.  As part of our continuing mission to provide you with exceptional heart care, we have created designated Provider Care Teams.  These Care Teams include your primary Cardiologist (physician) and Advanced Practice Providers (APPs -  Physician Assistants and Nurse Practitioners) who all work together to provide you with the care you need, when you need it. ° °We recommend signing up for the patient portal called "MyChart".  Sign up information is provided on this After Visit Summary.  MyChart is used to connect with patients for Virtual Visits (Telemedicine).  Patients are able to view lab/test results, encounter notes, upcoming appointments, etc.  Non-urgent messages can be sent to your provider as well.   °To learn more about what you can do with MyChart, go to [email or web address].   ° °Your next appointment:   °1 year(s) ° °The format for your next appointment:   °In Person ° °Provider:   °Rajan Revankar, MD  ° ° °Other Instructions ° ° °

## 2021-01-20 DIAGNOSIS — G894 Chronic pain syndrome: Secondary | ICD-10-CM | POA: Diagnosis not present

## 2021-01-20 DIAGNOSIS — M47816 Spondylosis without myelopathy or radiculopathy, lumbar region: Secondary | ICD-10-CM | POA: Diagnosis not present

## 2021-01-20 DIAGNOSIS — M5136 Other intervertebral disc degeneration, lumbar region: Secondary | ICD-10-CM | POA: Diagnosis not present

## 2021-01-20 DIAGNOSIS — M48061 Spinal stenosis, lumbar region without neurogenic claudication: Secondary | ICD-10-CM | POA: Diagnosis not present

## 2021-01-20 DIAGNOSIS — M5137 Other intervertebral disc degeneration, lumbosacral region: Secondary | ICD-10-CM | POA: Diagnosis not present

## 2021-01-26 ENCOUNTER — Ambulatory Visit (INDEPENDENT_AMBULATORY_CARE_PROVIDER_SITE_OTHER): Payer: PPO | Admitting: Legal Medicine

## 2021-01-26 ENCOUNTER — Other Ambulatory Visit: Payer: Self-pay

## 2021-01-26 ENCOUNTER — Encounter: Payer: Self-pay | Admitting: Legal Medicine

## 2021-01-26 ENCOUNTER — Telehealth: Payer: Self-pay | Admitting: Legal Medicine

## 2021-01-26 VITALS — BP 132/62 | HR 90 | Temp 97.5°F | Ht 70.0 in | Wt 143.6 lb

## 2021-01-26 DIAGNOSIS — E0840 Diabetes mellitus due to underlying condition with diabetic neuropathy, unspecified: Secondary | ICD-10-CM | POA: Diagnosis not present

## 2021-01-26 DIAGNOSIS — I739 Peripheral vascular disease, unspecified: Secondary | ICD-10-CM | POA: Diagnosis not present

## 2021-01-26 MED ORDER — RYBELSUS 7 MG PO TABS: 7.0000 mg | ORAL_TABLET | Freq: Every day | ORAL | 6 refills | Status: DC

## 2021-01-26 NOTE — Chronic Care Management (AMB) (Signed)
  Chronic Care Management   Outreach Note  01/26/2021 Name: Andrew Wiggins MRN: [id number] DOB: [birth year]  Referred by: Abigail Miyamoto, MD Reason for referral : No chief complaint on file.   An unsuccessful telephone outreach was attempted today. The patient was referred to the pharmacist for assistance with care management and care coordination.   Follow Up Plan:   Tatjana Dellinger Upstream Scheduler

## 2021-01-26 NOTE — Assessment & Plan Note (Addendum)
Stenting superficial femoral artery left side

## 2021-01-26 NOTE — Progress Notes (Signed)
Established Patient Office Visit  Subjective:  Patient ID: Andrew Wiggins, male    DOB: Jan 10, 1956  Age: 65 y.o. MRN: [id number]  CC:  Chief Complaint  Patient presents with   peripheral vascular disease    2 weeks follow up    HPI Andrew Wiggins presents for follow up.  Patient recently had stenting superficial femoral artery.  The leg hurts less at night adding tylenol in between.  He can walk.  His last A1c 8.2, we started rebelsus and he is doing better.  He is tolerating this well. We will continue medicine.  I explained his recent MRI. 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 ~  Jan11:  Last seen by Cards & pacer reprogrammed...     Cardiac pacemaker in situ    Carotid artery disease ([place]) 07/27/2010   R/O PERIPHERAL VASCULAR DISEASE (ICD-443.9) - on ASA 46m/d... exam 11/09 shows gr 1-2 sys murmur LSB, but also has prominent bruit to base of right side of his neck w/ right > left Carotid Bruit... ~  CDopplers 11/09 showed severe plaque right ICA, & min plaque on left... no signif ICA stenoses per report. ~  f/u CDopplers 12/10 showed mild irreg plaque bilat in bulbs & intimal thickening in righ   COPD (chronic obstructive pulmonary disease) [name] 03/26/2008   CHRONIC OBSTRUCTIVE PULMONARY DISEASE (ICD-496) - hx recurrent bronchitic episodes in the past... smokes 1/2 - 1ppd x yrs... ~  PFT's 11/08 showed FVC= 3.85 (79%), FEV1= 2.19 (55%), %1sec= 57, mid-flows= 32% predicted:  all c/w mod airflow obstruction...  ~  CXR 12/10 showed left subclav pacer, mild hyperinflation, NAD...     Dermatitis, contact    due to poison ivy   Diabetes mellitus with neuropathy ([name] 12/20/2016   Dyslipidemia  03/06/2012   Essential hypertension 04/10/2007    HYPERTENSION (ICD-401.9) - prev on Micardis 834md then switched to Losartan 10057m, but he lost his job in 2011& couldn't afford Rx;  He called us [place] w/ this news & we phoned in Lisinopril 64m6m.. ~  2DEcho 11/09 showed sl incr LVwall thickness, norm LVF w/ EF= 55-60%, no regional wall motion abn, mild DD, mild MR... ~  Myoview 12/09 showed LBBB, no perfusion defects, abn septal motion & EF   GERD 03/26/2008    GERD (ICD-530.81) - prev mild GERD symptoms that improved after cholecystectomy on 2000... uses OTC PPI as needed... ~  he is due for routine screening colonoscopy...       GERD (gastroesophageal reflux disease)    Headache(784.0)    Hypertension    Mixed hyperlipidemia 06/19/2019   Peripheral vascular disease (HCC)[place] Sciatica 11/18/2019   Tobacco use disorder     Past Surgical History:  Procedure Laterality Date   ABDOMINAL AORTOGRAM W/LOWER EXTREMITY N/A 12/14/2020   Procedure: ABDOMINAL AORTOGRAM W/LOWER EXTREMITY;  Surgeon: BrabSerafina Mitchell;  [facility];  Service: Cardiovascular;  Laterality: N/A;   laproscopic cholecystectomy  2000   by Dr. HoxsAbran CantorACEMAKER INSERTION  03/2008   by Dr. TaylLovena LeERIPHERAL VASCULAR INTERVENTION Left 12/14/2020   Procedure: PERIPHERAL VASCULAR INTERVENTION;  Surgeon: BrabSerafina Mitchell  MD;  Location: [facility];  Service: Cardiovascular;  Laterality: Left;  SFA    Family History  Problem Relation Age of Onset   Diabetes Brother     Social History   Socioeconomic History   Marital status: Legally Separated    Spouse name: Not on file   Number of children: 2   Years of education: Not on file   Highest education level: Not on file  Occupational History   Occupation: unemployed    Comment: works for himself  Tobacco Use   Smoking status: Every Day    Packs/day: 1.00    Years: 25.00    Pack years: 25.00    Types: Cigarettes   Smokeless tobacco:  Never  Vaping Use   Vaping Use: Never used  Substance and Sexual Activity   Alcohol use: Not Currently    Alcohol/week: 0.0 standard drinks   Drug use: No   Sexual activity: Not Currently  Other Topics Concern   Not on file  Social History Narrative   2 siblings died with leukemia around age 45   Social Determinants of Health   Financial Resource Strain: Low Risk    Difficulty of Paying Living Expenses: Not hard at all  Food Insecurity: No Food Insecurity   Worried About Charity fundraiser in the Last Year: Never true   Arboriculturist in the Last Year: Never true  Transportation Needs: No Transportation Needs   Lack of Transportation (Medical): No   Lack of Transportation (Non-Medical): No  Physical Activity: Inactive   Days of Exercise per Week: 0 days   Minutes of Exercise per Session: 30 min  Stress: Stress Concern Present   Feeling of Stress : To some extent  Social Connections: Socially Isolated   Frequency of Communication with Friends and Family: Three times a week   Frequency of Social Gatherings with Friends and Family: Never   Attends Religious Services: Never   Marine scientist or Organizations: No   Attends Music therapist: Never   Marital Status: Divorced  Human resources officer Violence: Not At Risk   Fear of Current or Ex-Partner: No   Emotionally Abused: No   Physically Abused: No   Sexually Abused: No    Outpatient Medications Prior to Visit  Medication Sig Dispense Refill   aspirin EC 81 MG tablet Take 1 tablet (81 mg total) by mouth daily. Swallow whole. 150 tablet 2   Aspirin-Acetaminophen-Caffeine (GOODY HEADACHE PO) Take 1 Package by mouth daily as needed (headache).     atorvastatin (LIPITOR) 20 MG tablet Take 20 mg by mouth at bedtime.     gabapentin (NEURONTIN) 300 MG capsule Take 1 capsule (300 mg total) by mouth 3 (three) times daily. 90 capsule 3   glimepiride (AMARYL) 4 MG tablet Take 1 tablet by mouth once daily 90 tablet 2    hydrochlorothiazide (MICROZIDE) 12.5 MG capsule Take 1 capsule (12.5 mg total) by mouth daily. 90 capsule 3   HYDROcodone-acetaminophen (NORCO) 10-325 MG tablet Take 1 tablet by mouth 3 (three) times daily.     lisinopril (ZESTRIL) 20 MG tablet TAKE 1 TABLET BY MOUTH IN THE MORNING AND AT BEDTIME 180 tablet 3   metFORMIN (GLUCOPHAGE-XR) 500 MG 24 hr tablet TAKE 1 TABLET BY MOUTH ONCE DAILY WITH EVENING MEAL 90 tablet 2   No facility-administered medications prior to visit.    No Known Allergies  ROS Review of Systems  Constitutional:  Negative for chills, fatigue  and fever.  HENT:  Negative for congestion, ear pain and sore throat.   Respiratory:  Negative for cough and shortness of breath.   Cardiovascular:  Negative for chest pain.  Gastrointestinal:  Negative for abdominal pain, constipation, diarrhea, nausea and vomiting.  Endocrine: Negative for polydipsia, polyphagia and polyuria.  Genitourinary:  Negative for dysuria and frequency.  Musculoskeletal:  Negative for arthralgias and myalgias.  Neurological:  Negative for dizziness and headaches.  Psychiatric/Behavioral:  Negative for dysphoric mood.        No dysphoria     Objective:    Physical Exam Vitals reviewed.  Constitutional:      General: He is not in acute distress.    Appearance: Normal appearance.  HENT:     Right Ear: Tympanic membrane normal.     Left Ear: Tympanic membrane normal.     Nose: Nose normal.  Eyes:     Extraocular Movements: Extraocular movements intact.     Conjunctiva/sclera: Conjunctivae normal.     Pupils: Pupils are equal, round, and reactive to light.  Cardiovascular:     Rate and Rhythm: Normal rate and regular rhythm.     Pulses: Normal pulses.     Heart sounds: No murmur heard.   No gallop.  Pulmonary:     Effort: Pulmonary effort is normal. No respiratory distress.     Breath sounds: Normal breath sounds. No wheezing.  Abdominal:     General: Abdomen is flat. Bowel sounds are  normal. There is no distension.     Palpations: Abdomen is soft.     Tenderness: There is no abdominal tenderness.  Musculoskeletal:        General: Normal range of motion.     Cervical back: Normal range of motion.     Right lower leg: No edema.     Left lower leg: No edema.  Skin:    General: Skin is warm.     Capillary Refill: Capillary refill takes less than 2 seconds.     Comments: Left foot warm and good capillary filling  Neurological:     General: No focal deficit present.     Mental Status: He is alert and oriented to person, place, and time. Mental status is at baseline.     Sensory: Sensory deficit present.    BP 132/62   Pulse 90   Temp (!) 97.5 F (36.4 C)   Ht 5' 10" (1.778 m)   Wt 143 lb 9.6 oz (65.1 kg)   SpO2 94%   BMI 20.60 kg/m  Wt Readings from Last 3 Encounters:  01/26/21 143 lb 9.6 oz (65.1 kg)  01/19/21 142 lb (64.4 kg)  01/12/21 141 lb 3.2 oz (64 kg)     Health Maintenance Due  Topic Date Due   COVID-19 Vaccine (1) Never done   Hepatitis C Screening  Never done   TETANUS/TDAP  Never done   COLONOSCOPY (Pts 45-51yr Insurance coverage will need to be confirmed)  Never done   Zoster Vaccines- Shingrix (1 of 2) Never done   FOOT EXAM  11/17/2020   INFLUENZA VACCINE  12/06/2020    There are no preventive care reminders to display for this patient.  Lab Results  Component Value Date   TSH 1.46 12/20/2017   Lab Results  Component Value Date   WBC 11.7 (H) 01/12/2021   HGB 14.1 01/12/2021   HCT 41.2 01/12/2021   MCV 94 01/12/2021   PLT 206 01/12/2021   Lab Results  Component  Value Date   NA 137 01/12/2021   K 3.8 01/12/2021   CO2 25 01/12/2021   GLUCOSE 262 (H) 01/12/2021   BUN 10 01/12/2021   CREATININE 0.94 01/12/2021   BILITOT 0.3 01/12/2021   ALKPHOS 103 01/12/2021   AST 17 01/12/2021   ALT 17 01/12/2021   PROT 6.7 01/12/2021   ALBUMIN 4.3 01/12/2021   CALCIUM 9.9 01/12/2021   EGFR 89 01/12/2021   GFR 83.08 12/20/2017    Lab Results  Component Value Date   CHOL 88 (L) 01/12/2021   Lab Results  Component Value Date   HDL 27 (L) 01/12/2021   Lab Results  Component Value Date   LDLCALC 32 01/12/2021   Lab Results  Component Value Date   TRIG 175 (H) 01/12/2021   Lab Results  Component Value Date   CHOLHDL 3.3 01/12/2021   Lab Results  Component Value Date   HGBA1C 8.2 (H) 01/12/2021      Assessment & Plan:   Problem List Items Addressed This Visit       Cardiovascular and Mediastinum   PVD (peripheral vascular disease) ([place])    Stenting superficial femoral artery left side      Relevant Orders   AMB Referral to [place] Patient has chronic pvd and s/p stenting, no further claudication     Endocrine   Diabetes mellitus with neuropathy ([place]) - Primary   Relevant Medications   Semaglutide (RYBELSUS) 7 MG TABS   Other Relevant Orders   AMB Referral to [place] An individual care plan for diabetes was established and reinforced today.  The patient's status was assessed using clinical findings on exam, labs and diagnostic testing. Patient success at meeting goals based on disease specific evidence-based guidelines and found to be fair controlled. Medications were assessed and patient's understanding of the medical issues , including barriers were assessed. Recommend adherence to a diabetic diet, a graduated exercise program, HgbA1c level is checked quarterly, and urine microalbumin performed yearly .  Annual mono-filament sensation testing performed. Lower blood pressure and control hyperlipidemia is important. Get annual eye exams and annual flu shots and smoking cessation discussed.  Self management goals were discussed.     Meds ordered this encounter  Medications   Semaglutide (RYBELSUS) 7 MG TABS    Sig: Take 7 mg by mouth daily.    Dispense:  30 tablet    Refill:  6    Follow-up: Return in about 4 months (around 05/28/2021) for fasting.     Reinaldo Meeker, MD

## 2021-01-26 NOTE — Progress Notes (Signed)
  Chronic Care Management   Note  01/26/2021 Name: FINNICK OROSZ MRN: [id number] DOB: Nov 28, 1955  TREBOR GALDAMEZ is a 65 y.o. year old male who is a primary care patient of Abigail Miyamoto, MD. I reached out to Enos Fling by phone today in response to a referral sent by Mr. Kayn Haymore Carrillo's PCP, Abigail Miyamoto, MD.   Mr. Allums was given information about Chronic Care Management services today including:  CCM service includes personalized support from designated clinical staff supervised by his physician, including individualized plan of care and coordination with other care providers 24/7 contact phone numbers for assistance for urgent and routine care needs. Service will only be billed when office clinical staff spend 20 minutes or more in a month to coordinate care. Only one practitioner may furnish and bill the service in a calendar month. The patient may stop CCM services at any time (effective at the end of the month) by phone call to the office staff.   Patient agreed to services and verbal consent obtained.   Follow up plan:  [employer] manager, fast food

## 2021-02-11 ENCOUNTER — Telehealth: Payer: Self-pay

## 2021-02-11 NOTE — Chronic Care Management (AMB) (Addendum)
Chronic Care Management Pharmacy Assistant   Name: Andrew Wiggins  MRN: [id number] DOB: 1956-01-07   Reason for Encounter: Chart review for CPP visit on 02-18-2021   Conditions to be addressed/monitored: CAD, HTN, HLD, COPD, and DMII   Recent office visits:  01-26-2021 Andrew Miyamoto, MD. START rybelsus 7 mg daily.  01-12-2021 Andrew Miyamoto, MD. STOP plavix and rosuvastatin. START gabapentin 300 mg 3 times daily.   12-03-2020 Andrew Miyamoto, MD. STOP aspirin patient preference. STOP Voltaren.  Recent consult visits:  01-19-2021 Andrew Ripple, DO (Cardiology). STOP amlodipine. Take atorvastatin 20 mg at night instead of morning. WBC= 11.7, neutrophils= 7.4, lymphocytes= 3.4. Glucose= 262. A1C= 8.2. Cholesterol= 88, trig= 175, HDL= 27.  12-29-2020 Andrew Mage, PA-C (Vascular and vein). Follow up in 6 months.  12-14-2020 Andrew Libman, MD. Abdominal aortogram w/ lower extremity procedure. START Aspirin 81 mg daily, rosuvastatin 10 mg daily, keflex 500 mg 4 times daily for 10 days, plavix 75 mg daily.  12-10-2020 Andrew Harman, MD (Vascular and vein). Initial consult for surgery.  11-17-2020 Andrew Hammed, MD. Unable to view encounter  10-20-2020 Andrew Hammed, MD. Unable to view encounter  10-06-2020 Andrew Ripple, DO (Cardiology). START atorvastatin 20 mg daily and HCTZ 12.5 mg daily.  09-28-2020 Andrew Coffin, RN ([facility]). Pacemaker check.  09-22-2020 Andrew Hammed, MD. Unable to view encounter.  09-08-2020 Andrew Givens, MD (Ophthalmology). Unable to view encounter.  08-25-2020 Andrew Hammed, MD. Unable to view encounter.  08-09-2020 Andrew Givens, MD (Ophthalmology). Unable to view encounter.   Hospital visits:  None in previous 6 months  Medications: Outpatient Encounter Medications as of 02/11/2021  Medication Sig   aspirin EC 81 MG tablet Take 1 tablet (81 mg total) by mouth daily. Swallow whole.    Aspirin-Acetaminophen-Caffeine (GOODY HEADACHE PO) Take 1 Package by mouth daily as needed (headache).   atorvastatin (LIPITOR) 20 MG tablet Take 20 mg by mouth at bedtime.   gabapentin (NEURONTIN) 300 MG capsule Take 1 capsule (300 mg total) by mouth 3 (three) times daily.   glimepiride (AMARYL) 4 MG tablet Take 1 tablet by mouth once daily   hydrochlorothiazide (MICROZIDE) 12.5 MG capsule Take 1 capsule (12.5 mg total) by mouth daily.   HYDROcodone-acetaminophen (NORCO) 10-325 MG tablet Take 1 tablet by mouth 3 (three) times daily.   lisinopril (ZESTRIL) 20 MG tablet TAKE 1 TABLET BY MOUTH IN THE MORNING AND AT BEDTIME   metFORMIN (GLUCOPHAGE-XR) 500 MG 24 hr tablet TAKE 1 TABLET BY MOUTH ONCE DAILY WITH EVENING MEAL   Semaglutide (RYBELSUS) 7 MG TABS Take 7 mg by mouth daily.   No facility-administered encounter medications on file as of 02/11/2021.   Lab Results  Component Value Date/Time   HGBA1C 8.2 (H) 01/12/2021 11:00 AM   HGBA1C 7.3 (H) 11/18/2019 10:23 AM   MICROALBUR 2.9 (H) 02/07/2017 02:03 PM     BP Readings from Last 3 Encounters:  01/26/21 132/62  01/19/21 111/68  01/12/21 122/74      Have you seen any other providers since your last visit with PCP?   Any changes in your medications or health?   Any side effects from any medications?   Do you have an symptoms or problems not managed by your medications?   Any concerns about your health right now?   Has your provider asked that you check blood pressure, blood sugar, or follow special diet at home?   Do you get any type of  exercise on a regular basis?   Can you think of a goal you would like to reach for your health?   Do you have any problems getting your medications?   Is there anything that you would like to discuss during the appointment?    Andrew Wiggins was reminded to have all medications, supplements and any blood glucose and blood pressure readings available for review with Andrew Wiggins. D,  at his telephone visit on 02-18-2021.  02-11-2021: 1st attempt left VM  Star Rating Drugs:  Rybelsus 7 mg- Last filled 01-26-2021 30 DS [facility] Metformin 500 mg- Last filled 11-24-2020 90 DS [facility] Lisinopril 20 mg- Last filled 01-19-2021 90 DS [facility] Glimepiride 4 mg- Last filled 11-19-2020 90 DS [facility] Atorvastatin 20 mg- Last filled   Care Gaps: Last annual wellness visit? If applicable: Last eye exam / retinopathy screening? Last diabetic foot exam?    Andrew Wiggins [facility] Clinical Pharmacist Assistant [phone]

## 2021-02-18 ENCOUNTER — Ambulatory Visit (INDEPENDENT_AMBULATORY_CARE_PROVIDER_SITE_OTHER): Payer: PPO

## 2021-02-18 NOTE — Progress Notes (Signed)
Patient no-showed appt but called me back an hour later. I spoke briefly with him, mainly introduced myself and asked if there was anything I could do. He stated he thinks he gets low sugars sometimes. When I asked if he tests when he feels this way he said, "No." Plan: Will test every other day before meals and PRN hypoglycemia. Scheduled initial visit for November 1st

## 2021-02-18 NOTE — Patient Instructions (Signed)
Visit Information   Goals Addressed   None    There are no care plans to display for this patient.   Andrew Wiggins was given information about Chronic Care Management services today including:  CCM service includes personalized support from designated clinical staff supervised by his physician, including individualized plan of care and coordination with other care providers 24/7 contact phone numbers for assistance for urgent and routine care needs. Standard insurance, coinsurance, copays and deductibles apply for chronic care management only during months in which we provide at least 20 minutes of these services. Most insurances cover these services at 100%, however patients may be responsible for any copay, coinsurance and/or deductible if applicable. This service may help you avoid the need for more expensive face-to-face services. Only one practitioner may furnish and bill the service in a calendar month. The patient may stop CCM services at any time (effective at the end of the month) by phone call to the office staff.  Patient agreed to services and verbal consent obtained.   The patient verbalized understanding of instructions, educational materials, and care plan provided today and declined offer to receive copy of patient instructions, educational materials, and care plan.  The pharmacy team will reach out to the patient again over the next 30 days.   Zettie Pho, [facility]

## 2021-02-21 DIAGNOSIS — M47816 Spondylosis without myelopathy or radiculopathy, lumbar region: Secondary | ICD-10-CM | POA: Diagnosis not present

## 2021-02-21 DIAGNOSIS — M549 Dorsalgia, unspecified: Secondary | ICD-10-CM | POA: Diagnosis not present

## 2021-02-21 DIAGNOSIS — M5136 Other intervertebral disc degeneration, lumbar region: Secondary | ICD-10-CM | POA: Diagnosis not present

## 2021-02-21 DIAGNOSIS — M48061 Spinal stenosis, lumbar region without neurogenic claudication: Secondary | ICD-10-CM | POA: Diagnosis not present

## 2021-02-21 DIAGNOSIS — M5137 Other intervertebral disc degeneration, lumbosacral region: Secondary | ICD-10-CM | POA: Diagnosis not present

## 2021-02-21 DIAGNOSIS — Z79891 Long term (current) use of opiate analgesic: Secondary | ICD-10-CM | POA: Diagnosis not present

## 2021-02-21 DIAGNOSIS — Z1389 Encounter for screening for other disorder: Secondary | ICD-10-CM | POA: Diagnosis not present

## 2021-02-21 DIAGNOSIS — G894 Chronic pain syndrome: Secondary | ICD-10-CM | POA: Diagnosis not present

## 2021-03-04 ENCOUNTER — Telehealth: Payer: Self-pay

## 2021-03-04 NOTE — Chronic Care Management (AMB) (Signed)
Pt called back and has been r/s for the 11/7 @ 12 pm

## 2021-03-04 NOTE — Chronic Care Management (AMB) (Signed)
I called pt reschedule initial appt with CPP on 11/1. Pt did not answer.   Roxana Hires, CMA Clinical Pharmacist Assistant  [phone]

## 2021-03-07 ENCOUNTER — Encounter: Payer: Self-pay | Admitting: Cardiology

## 2021-03-07 ENCOUNTER — Ambulatory Visit (INDEPENDENT_AMBULATORY_CARE_PROVIDER_SITE_OTHER): Payer: PPO | Admitting: Cardiology

## 2021-03-07 ENCOUNTER — Ambulatory Visit (INDEPENDENT_AMBULATORY_CARE_PROVIDER_SITE_OTHER): Payer: PPO

## 2021-03-07 ENCOUNTER — Other Ambulatory Visit: Payer: Self-pay

## 2021-03-07 VITALS — BP 109/67 | HR 90 | Ht 70.0 in | Wt 147.8 lb

## 2021-03-07 DIAGNOSIS — E0840 Diabetes mellitus due to underlying condition with diabetic neuropathy, unspecified: Secondary | ICD-10-CM | POA: Diagnosis not present

## 2021-03-07 DIAGNOSIS — I10 Essential (primary) hypertension: Secondary | ICD-10-CM | POA: Diagnosis not present

## 2021-03-07 DIAGNOSIS — I442 Atrioventricular block, complete: Secondary | ICD-10-CM

## 2021-03-07 DIAGNOSIS — E782 Mixed hyperlipidemia: Secondary | ICD-10-CM | POA: Diagnosis not present

## 2021-03-07 NOTE — Patient Instructions (Signed)
Medication Instructions:  Your physician recommends that you continue on your current medications as directed. Please refer to the Current Medication list given to you today.  *If you need a refill on your cardiac medications before your next appointment, please call your pharmacy*   Lab Work: None ordered If you have labs (blood work) drawn today and your tests are completely normal, you will receive your results only by: MyChart Message (if you have MyChart) OR A paper copy in the mail If you have any lab test that is abnormal or we need to change your treatment, we will call you to review the results.   Testing/Procedures: None ordered   Follow-Up: At [facility], you and your health needs are our priority.  As part of our continuing mission to provide you with exceptional heart care, we have created designated Provider Care Teams.  These Care Teams include your primary Cardiologist (physician) and Advanced Practice Providers (APPs -  Physician Assistants and Nurse Practitioners) who all work together to provide you with the care you need, when you need it.   Your next appointment:   2 week(s) after your battery change  The format for your next appointment:   In Person  Provider:   Device clinic for a wound check    Thank you for choosing [facility]!!   Dory Horn, RN [phone]   Other Instructions   Your device is nearing replacement time...Marland KitchenMarland KitchenMarland Kitchen the office will contact you when we need to arrange a battery change.    Implantable Device Instructions  You are scheduled for: Pacemaker battery change on ____________ with Dr. Elberta Fortis.  1.   Pre procedure testing-             A.  LAB WORK--- On ____________  for your pre procedure blood work.  You do NOT need to be fasting.  On the day of your procedure ________________ you will go to [facility] [phone]) at ______________.  You will go to the main entrance [address] [employer]) and enter  where the [employer] parking staff are.  You will check in at ADMITTING.  You may have one support person come in to the hospital with you.  They will be asked to wait in the waiting room.   3.   Do not eat or drink after midnight prior to your procedure.   4.   On the morning of your procedure do NOT take any medication.  5.  The night before your procedure and the morning of your procedure scrub your neck/chest with surgical scrub.  See instruction letter.   5.  Plan for an overnight stay, but you may be discharged home after your procedure. If you use your phone frequently bring your phone charger, in case you have to stay.  If you are discharged after your procedure you will need someone to drive you home and be with your for 24 hours after your procedure.   6.  You will follow up with the [facility] Device clinic 10-14 days after your procedure. You will follow up with Dr. Elberta Fortis 91 days after your procedure.  These appointments will be made for you.  7. FYI: For your safety, and to allow [place] to monitor your vital signs accurately during the surgery/procedure we request that if you have artificial nails, gel coating, SNS etc. Please have those removed prior to your surgery/procedure. Not having the nail coverings /polish removed may result in cancellation or delay of  your surgery/procedure.   * If you have ANY questions after you get home, please call the office [phone] and ask for Citlalli Weikel RN or send a MyChart message.     [facility] - Preparing For Surgery  Before surgery, you can play an important role. Because skin is not sterile, your skin needs to be as free of germs as possible. You can reduce the number of germs on your skin by washing with CHG (chlorahexidine gluconate) Soap before surgery.  CHG is an antiseptic cleaner which kills germs and bonds with the skin to continue killing germs even after washing.   Please do not use if you have an allergy to CHG or antibacterial soaps.   If your skin becomes reddened/irritated stop using the CHG.   Do not shave (including legs and underarms) for at least 48 hours prior to first CHG shower.  It is OK to shave your face.  Please follow these instructions carefully:  1.  Shower the night before surgery and the morning of surgery with CHG.  2.  If you choose to wash your hair, wash your hair first as usual with your normal shampoo.  3.  After you shampoo, rinse your hair and body thoroughly to remove the shampoo.  4.  Use CHG as you would any other liquid soap.  You can apply CHG directly to the skin and wash gently with a clean washcloth. 5.  Apply the CHG Soap to your body ONLY FROM THE NECK DOWN.  Do not use on open wounds or open sores.  Avoid contact with your eyes, ears, mouth and genitals (private parts).  Wash genitals (private parts) with your normal soap.  6.  Wash thoroughly, paying special attention to the area where your surgery will be performed.  7.  Thoroughly rinse your body with warm water from the neck down.   8.  DO NOT shower/wash with your normal soap after using and rinsing off the CHG soap.  9.  Pat yourself dry with a clean towel.           10.  Wear clean pajamas.           11.  Place clean sheets on your bed the night of your first shower and do not sleep with pets.  Day of Surgery: Do not apply any deodorants/lotions.  Please wear clean clothes to the hospital/surgery center.

## 2021-03-07 NOTE — Progress Notes (Signed)
Electrophysiology Office Note   Date:  03/07/2021   ID:  Andrew Wiggins, Andrew Wiggins 27-Aug-1955, MRN [id number]  PCP:  Abigail Miyamoto, MD  Cardiologist:  [name] Primary Electrophysiologist:  Dagny Fiorentino Andrew Loa, MD    Chief Complaint: pacemaker   History of Present Illness: Andrew Wiggins is a 65 y.o. male who is being seen today for the evaluation of pacemaker at the request of Abigail Miyamoto,*. Presenting today for electrophysiology evaluation.  He has a history significant for complete heart block status post Medtronic dual-chamber pacemaker, hypertension, COPD, hyperlipidemia.   On 12/14/2020, he had an angiogram due to a left toe ulcer.  He had a stent placed in his left superficial femoral artery.  Today, denies symptoms of palpitations, chest pain, shortness of breath, orthopnea, PND, lower extremity edema, claudication, dizziness, presyncope, syncope, bleeding, or neurologic sequela. The patient is tolerating medications without difficulties.  He continues to complain of left foot pain.  He is currently on gabapentin.  He has been told multiple times that it Jadyn Brasher take quite a bit of time for his foot to heal.  On device interrogation today, he has 2 months left on his battery.   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 ~  Jan11:  Last seen by Cards & pacer reprogrammed...     Cardiac pacemaker in situ    Carotid artery disease (HCC) 07/27/2010   R/O PERIPHERAL VASCULAR DISEASE (ICD-443.9) - on ASA 81mg /d... exam 11/09 shows gr 1-2 sys murmur LSB, but also has prominent bruit to base of right side of his neck w/ right > left Carotid Bruit... ~  CDopplers 11/09 showed severe plaque right ICA, & min plaque on  left... no signif ICA stenoses per report. 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 smokes 1/2 - 1ppd x yrs... ~  PFT's 11/08 showed FVC= 3.85 (79%), FEV1= 2.19 (55%), %1sec= 57, mid-flows= 32% predicted:  all c/w mod airflow obstruction...  ~  CXR 12/10 showed left subclav pacer, mild hyperinflation, NAD...     Dermatitis, contact    due to poison ivy   Diabetes mellitus with neuropathy (HCC) 12/20/2016   Dyslipidemia 03/06/2012   Essential hypertension 04/10/2007    HYPERTENSION (ICD-401.9) - prev on Micardis 80mg /d then switched to Losartan 100mg /d, but he lost his job in 2011& couldn't afford Rx;  He called 14/07/2006 2/12 w/ this news & we phoned in Lisinopril 20mg /d... ~  2DEcho 11/09 showed sl incr LVwall thickness, norm LVF w/ EF= 55-60%, no regional wall motion abn, mild DD, mild MR... ~  Myoview 12/09 showed LBBB, no perfusion defects, abn septal motion & EF   GERD 03/26/2008    GERD (ICD-530.81) - prev mild GERD symptoms that improved after cholecystectomy on 2000... uses OTC PPI as needed... ~  he is due for routine screening colonoscopy...       GERD (gastroesophageal reflux disease)    Headache(784.0)    Hypertension    Mixed hyperlipidemia 06/19/2019   Peripheral  vascular disease (HCC)    Sciatica 11/18/2019   Tobacco use disorder    Past Surgical History:  Procedure Laterality Date   ABDOMINAL AORTOGRAM W/LOWER EXTREMITY N/A 12/14/2020   Procedure: ABDOMINAL AORTOGRAM W/LOWER EXTREMITY;  Surgeon: Nada Libman, MD;  Location: [facility];  Service: Cardiovascular;  Laterality: N/A;   laproscopic cholecystectomy  2000   by Dr. Odie Sera   PACEMAKER INSERTION  03/2008   by Dr. Ladona Ridgel   PERIPHERAL VASCULAR INTERVENTION Left 12/14/2020   Procedure:  PERIPHERAL VASCULAR INTERVENTION;  Surgeon: Nada Libman, MD;  Location: [facility]VAS[facility] CV [facility];  Service: Cardiovascular;  Laterality: Left;  SFA     Current Outpatient Medications  Medication Sig Dispense Refill   aspirin EC 81 MG tablet Take 1 tablet (81 mg total) by mouth daily. Swallow whole. 150 tablet 2   Aspirin-Acetaminophen-Caffeine (GOODY HEADACHE PO) Take 1 Package by mouth daily as needed (headache).     atorvastatin (LIPITOR) 20 MG tablet Take 20 mg by mouth at bedtime.     gabapentin (NEURONTIN) 300 MG capsule Take 1 capsule (300 mg total) by mouth 3 (three) times daily. 90 capsule 3   glimepiride (AMARYL) 4 MG tablet Take 1 tablet by mouth once daily 90 tablet 2   hydrochlorothiazide (MICROZIDE) 12.5 MG capsule Take 1 capsule (12.5 mg total) by mouth daily. 90 capsule 3   HYDROcodone-acetaminophen (NORCO) 10-325 MG tablet Take 1 tablet by mouth 3 (three) times daily.     lisinopril (ZESTRIL) 20 MG tablet TAKE 1 TABLET BY MOUTH IN THE MORNING AND AT BEDTIME 180 tablet 3   metFORMIN (GLUCOPHAGE-XR) 500 MG 24 hr tablet TAKE 1 TABLET BY MOUTH ONCE DAILY WITH EVENING MEAL 90 tablet 2   Semaglutide (RYBELSUS) 7 MG TABS Take 7 mg by mouth daily. 30 tablet 6   No current facility-administered medications for this visit.    Allergies:   Patient has no known allergies.   Social History:  The patient  reports that he has been smoking cigarettes. He has a 25.00 pack-year smoking history. He has never used smokeless tobacco. He reports that he does not currently use alcohol. He reports that he does not use drugs.   Family History:  The patient's family history includes Diabetes in his brother.   ROS:  Please see the history of present illness.   Otherwise, review of systems is positive for none.   All other systems are reviewed and negative.   PHYSICAL EXAM: VS:  BP 109/67   Pulse 90   Ht 5\' 10"  (1.778 m)   Wt 147 lb 12.8 oz (67 kg)   SpO2 97%   BMI 21.21 kg/m  , BMI Body mass  index is 21.21 kg/m. GEN: Well nourished, well developed, in no acute distress  HEENT: normal  Neck: no JVD, carotid bruits, or masses Cardiac: RRR; no murmurs, rubs, or gallops,no edema  Respiratory:  clear to auscultation bilaterally, normal work of breathing GI: soft, nontender, nondistended, + BS MS: no deformity or atrophy  Skin: warm and dry, device site well healed Neuro:  Strength and sensation are intact Psych: euthymic mood, full affect  EKG:  EKG is not ordered today. Personal review of the ekg ordered 12/14/20 shows atrial sensed, ventricular paced, rate 102  Personal review of the device interrogation today. Results in [facility]    Recent Labs: 01/12/2021: ALT 17; BUN 10; Creatinine, Ser 0.94; Hemoglobin 14.1; Platelets 206; Potassium 3.8; Sodium 137    Lipid Panel  Component Value Date/Time   CHOL 88 (L) 01/12/2021 1100   TRIG 175 (H) 01/12/2021 1100   HDL 27 (L) 01/12/2021 1100   CHOLHDL 3.3 01/12/2021 1100   CHOLHDL 5 12/20/2017 1320   VLDL 44.0 (H) 12/20/2017 1320   LDLCALC 32 01/12/2021 1100   LDLDIRECT 113.0 12/20/2017 1320     Wt Readings from Last 3 Encounters:  03/07/21 147 lb 12.8 oz (67 kg)  01/26/21 143 lb 9.6 oz (65.1 kg)  01/19/21 142 lb (64.4 kg)      Other studies Reviewed: Additional studies/ records that were reviewed today include: epic notes   ASSESSMENT AND PLAN:  1.  Complete heart block: Status post Medtronic dual-chamber pacemaker.  Device functioning appropriately.  Battery is at [facility].  He has 2 months left.  We Lesley Galentine plan for generator change.  Risks and benefits were discussed to bleeding and infection.  He understands these risks and has agreed to the procedure.  2.  Hypertension: Currently well controlled  3.  Hyperlipidemia: Continue statin per primary physician.  4.  Peripheral arterial disease: Status post left superficial femoral artery stent.  Plan per primary cardiology and vascular surgery.  5.  Tobacco abuse:  Complete cessation encouraged  Current medicines are reviewed at length with the patient today.   The patient does not have concerns regarding his medicines.  The following changes were made today: None  Labs/ tests ordered today include:  No orders of the defined types were placed in this encounter.    Disposition:   FU with Daliyah Sramek 3 months  Signed, Aloria Looper Andrew Loa, MD  03/07/2021 11:32 AM     [facility] [address] [place] [address] [phone] (office) [phone] (fax)

## 2021-03-08 ENCOUNTER — Telehealth: Payer: Self-pay

## 2021-03-08 ENCOUNTER — Telehealth: Payer: PPO

## 2021-03-08 LAB — CUP PACEART REMOTE DEVICE CHECK
Battery Impedance: 6863 Ohm
Battery Remaining Longevity: 2 mo
Battery Voltage: 2.6 V
Brady Statistic AP VP Percent: 2 %
Brady Statistic AP VS Percent: 0 %
Brady Statistic AS VP Percent: 98 %
Brady Statistic AS VS Percent: 0 %
Date Time Interrogation Session: 20221031120735
Implantable Lead Implant Date: 20091120
Implantable Lead Implant Date: 20091120
Implantable Lead Location: 753859
Implantable Lead Location: 753860
Implantable Lead Model: 5076
Implantable Lead Model: 5076
Implantable Pulse Generator Implant Date: 20091120
Lead Channel Impedance Value: 578 Ohm
Lead Channel Impedance Value: 967 Ohm
Lead Channel Pacing Threshold Amplitude: 0.625 V
Lead Channel Pacing Threshold Amplitude: 0.75 V
Lead Channel Pacing Threshold Pulse Width: 0.4 ms
Lead Channel Pacing Threshold Pulse Width: 0.4 ms
Lead Channel Setting Pacing Amplitude: 1.5 V
Lead Channel Setting Pacing Amplitude: 2 V
Lead Channel Setting Pacing Pulse Width: 0.4 ms
Lead Channel Setting Sensing Sensitivity: 5.6 mV

## 2021-03-08 NOTE — Chronic Care Management (AMB) (Signed)
Chronic Care Management Pharmacy Assistant   Name: Andrew Wiggins  MRN: [id number] DOB: [birth year]   Reason for Encounter: Chart Prep for initial visit with CPP on 11/7   Conditions to be addressed/monitored: CAD, HTN, HLD, COPD, and DMII  Recent office visits:  01-26-2021 Abigail Miyamoto, MD. START rybelsus 7 mg daily.   01-12-2021 Abigail Miyamoto, MD. STOP plavix and rosuvastatin. START gabapentin 300 mg 3 times daily.    12-03-2020 Abigail Miyamoto, MD. STOP aspirin patient preference. STOP Voltaren.  Recent consult visits:  03/07/21 (Cardiology) Regan Lemming MD. Seen for Complete AV block. No med changes.  01-19-2021 Thomasene Ripple, DO (Cardiology). STOP amlodipine. Take atorvastatin 20 mg at night instead of morning. WBC= 11.7, neutrophils= 7.4, lymphocytes= 3.4. Glucose= 262. A1C= 8.2. Cholesterol= 88, trig= 175, HDL= 27.   12-29-2020 Lars Mage, PA-C (Vascular and vein). Follow up in 6 months.   12-14-2020 Nada Libman, MD. Abdominal aortogram w/ lower extremity procedure. START Aspirin 81 mg daily, rosuvastatin 10 mg daily, keflex 500 mg 4 times daily for 10 days, plavix 75 mg daily.   12-10-2020 Maeola Harman, MD (Vascular and vein). Initial consult for surgery.   11-17-2020 Fredda Hammed, MD. Unable to view encounter   10-20-2020 Fredda Hammed, MD. Unable to view encounter   10-06-2020 Thomasene Ripple, DO (Cardiology). START atorvastatin 20 mg daily and HCTZ 12.5 mg daily.   09-28-2020 Lenor Coffin, RN ([facility]). Pacemaker check.   09-22-2020 Fredda Hammed, MD. Unable to view encounter.   09-08-2020 Ward Givens, MD (Ophthalmology). Unable to view encounter.   08-25-2020 Fredda Hammed, MD. Unable to view encounter.   08-09-2020 Ward Givens, MD (Ophthalmology). Unable to view encounter.    Hospital visits:  None in previous 6 months  Medications: Outpatient Encounter Medications as of 03/08/2021   Medication Sig   aspirin EC 81 MG tablet Take 1 tablet (81 mg total) by mouth daily. Swallow whole.   Aspirin-Acetaminophen-Caffeine (GOODY HEADACHE PO) Take 1 Package by mouth daily as needed (headache).   atorvastatin (LIPITOR) 20 MG tablet Take 20 mg by mouth at bedtime.   gabapentin (NEURONTIN) 300 MG capsule Take 1 capsule (300 mg total) by mouth 3 (three) times daily.   glimepiride (AMARYL) 4 MG tablet Take 1 tablet by mouth once daily   hydrochlorothiazide (MICROZIDE) 12.5 MG capsule Take 1 capsule (12.5 mg total) by mouth daily.   HYDROcodone-acetaminophen (NORCO) 10-325 MG tablet Take 1 tablet by mouth 3 (three) times daily.   lisinopril (ZESTRIL) 20 MG tablet TAKE 1 TABLET BY MOUTH IN THE MORNING AND AT BEDTIME   metFORMIN (GLUCOPHAGE-XR) 500 MG 24 hr tablet TAKE 1 TABLET BY MOUTH ONCE DAILY WITH EVENING MEAL   Semaglutide (RYBELSUS) 7 MG TABS Take 7 mg by mouth daily.   No facility-administered encounter medications on file as of 03/08/2021.    Lab Results  Component Value Date/Time   HGBA1C 8.2 (H) 01/12/2021 11:00 AM   HGBA1C 7.3 (H) 11/18/2019 10:23 AM   MICROALBUR 2.9 (H) 02/07/2017 02:03 PM     BP Readings from Last 3 Encounters:  03/07/21 109/67  01/26/21 132/62  01/19/21 111/68    Patient Questions:   Have you seen any other providers since your last visit with PCP? Pt stated he sees the Cardiologist for his pacemaker. Pt stated he will have to under go a simple procedure to have his pacemaker batteries replaced.   Any changes in your medications or  health? Pt stated no changes   Any side effects from any medications? Pt stated no side effects   Do you have an symptoms or problems not managed by your medications? Pt stated none   Any concerns about your health right now? Pt stated no concerns at the moment  Has your provider asked that you check blood pressure, blood sugar, or follow special diet at home? Pt stated he does check his Blood sugars but hasn't  in the last few days. He stated he watches his sugar intake.  Do you get any type of exercise on a regular basis? Pt stated he does a lot walking since he had a blood clot in his leg to help the swelling.   Can you think of a goal you would like to reach for your health? Pt wants to get back in shape and be able to do the same stuff he use to do. He wants to come and go without pain. Pt stated he is working on his tobacco use and wants to cut down on this. He is aware that this is a lot of cause of what is slowing him down.   Do you have any problems getting your medications? No issues   Is there anything that you would like to discuss during the appointment? Pt has nothing else to add.    Keeton L Borak was reminded to have all medications, supplements and any blood glucose and blood pressure readings available for review with Artelia Laroche Pharm. D, at his telephone visit on 03/14/21 at 12:00 pm .    Star Rating Drugs:  Medication:    Rybelsus 7 mg- 01-26-2021 30 DS  Metformin 500 mg- 11-24-2020 90 DS  Lisinopril 20 mg- 01-19-2021 90 DS  Glimepiride 4 mg-  11-19-2020 90 DS  Atorvastatin 20 mg- 10/06/20 90ds   Care Gaps: Last annual wellness visit? None noted  If applicable: Last eye exam / retinopathy screening? 07/29/20 Last diabetic foot exam? 11/18/19   Roxana Hires, CMA Clinical Pharmacist Assistant  [phone]

## 2021-03-11 ENCOUNTER — Telehealth: Payer: Self-pay

## 2021-03-11 NOTE — Chronic Care Management (AMB) (Signed)
I called and lvm reminding pt of telephone visit on 03/14/21 @ 12 pm.  Roxana Hires, CMA Clinical Pharmacist Assistant  [phone]

## 2021-03-14 ENCOUNTER — Telehealth: Payer: Self-pay | Admitting: Cardiology

## 2021-03-14 ENCOUNTER — Other Ambulatory Visit: Payer: Self-pay

## 2021-03-14 ENCOUNTER — Ambulatory Visit (INDEPENDENT_AMBULATORY_CARE_PROVIDER_SITE_OTHER): Payer: PPO

## 2021-03-14 DIAGNOSIS — I10 Essential (primary) hypertension: Secondary | ICD-10-CM

## 2021-03-14 DIAGNOSIS — E0840 Diabetes mellitus due to underlying condition with diabetic neuropathy, unspecified: Secondary | ICD-10-CM

## 2021-03-14 DIAGNOSIS — E782 Mixed hyperlipidemia: Secondary | ICD-10-CM

## 2021-03-14 DIAGNOSIS — I739 Peripheral vascular disease, unspecified: Secondary | ICD-10-CM

## 2021-03-14 NOTE — Telephone Encounter (Signed)
He can continue on the Lipitor.

## 2021-03-14 NOTE — Progress Notes (Signed)
Remote pacemaker transmission.   

## 2021-03-14 NOTE — Progress Notes (Signed)
Chronic Care Management Pharmacy Note  03/14/2021 Name:  Andrew Wiggins MRN:  [id number] DOB:  May 25, 1955  Summary: -Pleasant 65 year old male presents for initial CCM visit. Worked at [employer] for 24 years before they left. 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 Due to his disability, he is unable to go out to his garage and work  Recommendations/Changes made from today's visit: -Patient is very confused regarding meds. Was taking both Rosuvastatin and Atorvastatin, then [facility] told him to take Rosuvastatin only but Cardio note said Atorvastatin. [facility] also told him to take Clopidogrel but that's not on his medslist. Will ask Specialist which he should be on -To avoid confusion in the future, onboard to [facility] where I can read PCP/Specialist notes and compare to Day before each refill. Will ask PCP to send new scripts of all meds and will do the same of Specialist  Subjective: Andrew Wiggins is an 65 y.o. year old male who is a primary patient of [name], [name], MD.  The CCM team was consulted for assistance with disease management and care coordination needs.    Engaged with patient by telephone for initial visit in response to provider referral for pharmacy case management and/or care coordination services.   Consent to Services:  The patient was given the following information about Chronic Care Management services today, agreed to services, and gave verbal consent: 1. CCM service includes personalized support from designated clinical staff supervised by the primary care provider, including individualized plan of care and coordination with other care providers 2. 24/7 contact phone numbers for assistance for urgent and routine care needs. 3. Service will only be billed when office clinical staff spend 20 minutes or more in a month to coordinate care. 4. Only one  practitioner may furnish and bill the service in a calendar month. 5.The patient may stop CCM services at any time (effective at the end of the month) by phone call to the office staff. 6. The patient will be responsible for cost sharing (co-pay) of up to 20% of the service fee (after annual deductible is met). Patient agreed to services and consent obtained.  Patient Care Team: [name], MD as PCP - General (Family Medicine) [name], DO as PCP - Cardiology (Cardiology) [name], MD as PCP - Electrophysiology (Cardiology) [name], [facility] (Inactive) as Pharmacist (Pharmacist)  Recent office visits:  01-26-2021 [name], MD. START rybelsus 7 mg daily.   01-12-2021 [name], MD. STOP plavix and rosuvastatin. START gabapentin 300 mg 3 times daily.    12-03-2020 [name], MD. STOP aspirin patient preference. STOP Voltaren.   Recent consult visits:  01-19-2021 [name], DO (Cardiology). STOP amlodipine. Take atorvastatin 20 mg at night instead of morning. WBC= 11.7, neutrophils= 7.4, lymphocytes= 3.4. Glucose= 262. A1C= 8.2. Cholesterol= 88, trig= 175, HDL= 27.   12-29-2020 [name], PA-C (Vascular and vein). Follow up in 6 months.   12-14-2020 [name], MD. Abdominal aortogram w/ lower extremity procedure. START Aspirin 81 mg daily, rosuvastatin 10 mg daily, keflex 500 mg 4 times daily for 10 days, plavix 75 mg daily.   12-10-2020 [name], MD (Vascular and vein). Initial consult for surgery.   11-17-2020 [name], MD. Unable to view encounter   10-20-2020 [name], MD. Unable to view encounter   10-06-2020 [name],  [name], DO (Cardiology). START atorvastatin 20 mg daily and HCTZ 12.5 mg daily.   09-28-2020 [name], RN ([facility]). Pacemaker check.   09-22-2020 [name], MD. Unable to view encounter.   09-08-2020 [name], MD (Ophthalmology). Unable  to view encounter.   08-25-2020 [name], MD. Unable to view encounter.   08-09-2020 [name], MD (Ophthalmology). Unable to view encounter.     Hospital visits:  None in previous 6 months   Objective:  Lab Results  Component Value Date   CREATININE 0.94 01/12/2021   BUN 10 01/12/2021   GFR 83.08 12/20/2017   GFRNONAA 90 11/18/2019   GFRAA 104 11/18/2019   NA 137 01/12/2021   K 3.8 01/12/2021   CALCIUM 9.9 01/12/2021   CO2 25 01/12/2021   GLUCOSE 262 (H) 01/12/2021    Lab Results  Component Value Date/Time   HGBA1C 8.2 (H) 01/12/2021 11:00 AM   HGBA1C 7.3 (H) 11/18/2019 10:23 AM   FRUCTOSAMINE 284 02/07/2017 02:03 PM   GFR 83.08 12/20/2017 01:20 PM   GFR 98.24 07/04/2017 12:36 PM   MICROALBUR 2.9 (H) 02/07/2017 02:03 PM    Last diabetic Eye exam:  Lab Results  Component Value Date/Time   HMDIABEYEEXA No Retinopathy 07/29/2020 12:00 AM    Last diabetic Foot exam: No results found for: HMDIABFOOTEX   Lab Results  Component Value Date   CHOL 88 (L) 01/12/2021   HDL 27 (L) 01/12/2021   LDLCALC 32 01/12/2021   LDLDIRECT 113.0 12/20/2017   TRIG 175 (H) 01/12/2021   CHOLHDL 3.3 01/12/2021    Hepatic Function Latest Ref Rng & Units 01/12/2021 11/18/2019 06/20/2019  Total Protein 6.0 - 8.5 g/dL 6.7 6.8 7.2  Albumin 3.8 - 4.8 g/dL 4.3 4.3 4.3  AST 0 - 40 IU/L _0 ALT 0 - 44 IU/L _1 Alk Phosphatase 44 - 121 IU/L 103 98 107  Total Bilirubin 0.0 - 1.2 mg/dL 0.3 0.5 0.4  Bilirubin, Direct 0.0 - 0.3 mg/dL - - -    Lab Results  Component Value Date/Time   TSH 1.46 12/20/2017 01:20 PM   TSH 1.20 06/07/2016 11:15 AM    CBC Latest Ref Rng & Units 01/12/2021 12/14/2020 11/18/2019  WBC 3.4 - 10.8 x10E3/uL 11.7(H) - 10.4  Hemoglobin 13.0 - 17.7 g/dL 14.1 16.0 15.7  Hematocrit 37.5 - 51.0 % 41.2 47.0 46.1  Platelets 150 - 450 x10E3/uL 206 - 267    No results found for: VD25OH  Clinical ASCVD: Yes  The ASCVD Risk score (Arnett DK, et al., 2019)  failed to calculate for the following reasons:   The valid total cholesterol range is 130 to 320 mg/dL    Depression screen [facility] 2/9 01/26/2021 01/12/2021 06/16/2020  Decreased Interest 0 0 0  Down, Depressed, Hopeless 0 0 0  PHQ - 2 Score 0 0 0     Other: (CHADS2VASc if Afib, MMRC or CAT for COPD, ACT, DEXA)  Social History   Tobacco Use  Smoking Status Every Day   Packs/day: 1.00   Years: 25.00   Pack years: 25.00   Types: Cigarettes  Smokeless Tobacco Never   BP Readings from Last 3 Encounters:  03/07/21 109/67  01/26/21 132/62  01/19/21 111/68   Pulse Readings from Last 3 Encounters:  03/07/21 90  01/26/21 90  01/19/21 (!) 112   Wt Readings from Last 3 Encounters:  03/07/21 147 lb 12.8 oz (67 kg)  01/26/21 143 lb 9.6 oz (  65.1 kg)  01/19/21 142 lb (64.4 kg)   BMI Readings from Last 3 Encounters:  03/07/21 21.21 kg/m  01/26/21 20.60 kg/m  01/19/21 20.37 kg/m    Assessment/Interventions: Review of patient past medical history, allergies, medications, health status, including review of consultants reports, laboratory and other test data, was performed as part of comprehensive evaluation and provision of chronic care management services.   SDOH:  (Social Determinants of Health) assessments and interventions performed: Yes  SDOH Screenings   Alcohol Screen: Low Risk    Last Alcohol Screening Score (AUDIT): 0  Depression (PHQ2-9): Low Risk    PHQ-2 Score: 0  Financial Resource Strain: Low Risk    Difficulty of Paying Living Expenses: Not hard at all  Food Insecurity: No Food Insecurity   Worried About Charity fundraiser in the Last Year: Never true   Ran Out of Food in the Last Year: Never true  Housing: Low Risk    Last Housing Risk Score: 0  Physical Activity: Inactive   Days of Exercise per Week: 0 days   Minutes of Exercise per Session: 30 min  Social Connections: Socially Isolated   Frequency of Communication with Friends and Family: Three times a week    Frequency of Social Gatherings with Friends and Family: Never   Attends Religious Services: Never   Marine scientist or Organizations: No   Attends Archivist Meetings: Never   Marital Status: Divorced  Stress: Stress Concern Present   Feeling of Stress : To some extent  Tobacco Use: High Risk   Smoking Tobacco Use: Every Day   Smokeless Tobacco Use: Never   Passive Exposure: Not on file  Transportation Needs: No Transportation Needs   Lack of Transportation (Medical): No   Lack of Transportation (Non-Medical): No    CCM Care Plan  No Known Allergies  Medications Reviewed Today     Reviewed by [name], MD (Physician) on 03/07/21 at 1111  Med List Status: <None>   Medication Order Taking? Sig Documenting Provider Last Dose Status Informant  aspirin EC 81 MG tablet [id number] Yes Take 1 tablet (81 mg total) by mouth daily. Swallow whole. [name], MD Taking Active   Aspirin-Acetaminophen-Caffeine (GOODY HEADACHE PO) [id number] Yes Take 1 Package by mouth daily as needed (headache). [provider] Taking Active Self  atorvastatin (LIPITOR) 20 MG tablet [id number] Yes Take 20 mg by mouth at bedtime. [provider] Taking Active   gabapentin (NEURONTIN) 300 MG capsule [id number] Yes Take 1 capsule (300 mg total) by mouth 3 (three) times daily. [name], MD Taking Active   glimepiride [facility]) 4 MG tablet [id number] Yes Take 1 tablet by mouth once daily [name], MD Taking Active Self  hydrochlorothiazide (MICROZIDE) 12.5 MG capsule [id number] Yes Take 1 capsule (12.5 mg total) by mouth daily. [name], DO Taking Active   HYDROcodone-acetaminophen (NORCO) 10-325 MG tablet [id number] Yes Take 1 tablet by mouth 3 (three) times daily. [provider] Taking Active Self  lisinopril (ZESTRIL) 20 MG tablet [id number] Yes TAKE 1 TABLET BY MOUTH IN THE MORNING AND AT BEDTIME [name], Kardie, DO Taking Active    metFORMIN (GLUCOPHAGE-XR) 500 MG 24 hr tablet [id number] Yes TAKE 1 TABLET BY MOUTH ONCE DAILY WITH EVENING MEAL [name], MD Taking Active Self  Semaglutide (RYBELSUS) 7 MG TABS [id number] Yes Take 7 mg by mouth daily. 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[place])    Dermatitis, contact    GERD (gastroesophageal reflux disease)    PVD (peripheral vascular disease) ([place])    Sciatica 11/18/2019   Mixed hyperlipidemia 06/19/2019   Diabetes mellitus with neuropathy ([place]) 12/20/2016   Allergic rhinitis 09/04/2012   Dyslipidemia 03/06/2012   Carotid artery disease ([place]) 07/27/2010   Cardiac pacemaker in situ 11/26/2008   Cardiac conduction disorder 11/26/2008   CIGARETTE SMOKER 03/26/2008   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   GERD 03/26/2008   Essential hypertension 04/10/2007   Headache(784.0) 04/10/2007    Immunization History  Administered Date(s) Administered   Influenza Whole 04/15/2009   Influenza,inj,Quad PF,6+ Mos 06/07/2015, 06/07/2016   Pneumococcal Polysaccharide-23 04/15/2009    Conditions to be addressed/monitored:  Hypertension, Hyperlipidemia, and Diabetes  Care Plan : [place]  Updates made by Lane Hacker, RPH since 03/14/2021 12:00 AM     Problem: DM, HTN, Pain   Priority: High  Onset Date: 03/14/2021     Goal: Disease State Management   Start Date: 03/14/2021  Expected End Date: 03/14/2022  This Visit's Progress: On track  Priority: High  Note:   Current Barriers:  Unable to self administer medications as prescribed  Pharmacist Clinical Goal(s):  Patient will contact provider office for questions/concerns as evidenced notation of  same in electronic health record through collaboration with PharmD and provider.   Interventions: 1:1 collaboration with [name], MD regarding development and update of comprehensive plan of care as evidenced by provider attestation and co-signature Inter-disciplinary care team collaboration (see longitudinal plan of care) Comprehensive medication review performed; medication list updated in electronic medical record  Hypertension (BP goal <140/90) BP Readings from Last 3 Encounters:  03/07/21 109/67  01/26/21 132/62  01/19/21 111/68  -Controlled -Current treatment: Lisinopril 69m QD HCTZ 12.550mQD -Medications previously tried: Amlodipine  -Current home readings: Doesn't test -Current dietary habits: "Tries to eat healthy" -Current exercise habits: None -Denies hypotensive/hypertensive symptoms -Educated on BP goals and benefits of medications for prevention of heart attack, stroke and kidney damage; -Counseled to monitor BP at home as needed, document, and provide log at future appointments -Recommended to continue current medication  CAD -Unknown control -Current treatment  ASA 8117mlopidogrel 75m58medications previously tried: N/A  November 2022: Per [facility] note, only on ASA 81mg106mtient said [facility] told him to take both, will let Cardio know and alert patient based on response   Hyperlipidemia: (LDL goal < 70) The ASCVD Risk score (Arnett DK, et al., 2019) failed to calculate for the following reasons:   The valid total cholesterol range is 130 to 320 mg/dL Lab Results  Component Value Date   CHOL 88 (L) 01/12/2021   CHOL 150 11/18/2019   CHOL 148 06/20/2019   Lab Results  Component Value Date   HDL 27 (L) 01/12/2021   HDL 33 (L) 11/18/2019   HDL 30 (L) 06/20/2019   Lab Results  Component Value Date   LDLCALC 32 01/12/2021   LDLCALC 97 11/18/2019   LDLCALC 91 06/20/2019   Lab Results  Component Value Date   TRIG 175 (H)  01/12/2021   TRIG 108 11/18/2019   TRIG 151 (H) 06/20/2019   Lab  Results  Component Value Date   CHOLHDL 3.3 01/12/2021   CHOLHDL 4.5 11/18/2019   CHOLHDL 4.9 06/20/2019   Lab Results  Component Value Date   LDLDIRECT 113.0 12/20/2017   LDLDIRECT 110.0 06/07/2016   LDLDIRECT 118.9 03/06/2012  -Controlled -Current treatment: Atorvastatin 6m Rosuvastatin 130m-Medications previously tried: N/A  -Current home readings: Doesn't test -Current dietary habits: "Tries to eat healthy" -Educated on Cholesterol goals;  November 2022: Patient was taking both statins, [facility] told him this month to only take Rosuvastatin, Cardio note has Atorvastatin. Sent msg to Cardio asking them to clarify  Diabetes (A1c goal <8%) Lab Results  Component Value Date   HGBA1C 8.2 (H) 01/12/2021   HGBA1C 7.3 (H) 11/18/2019   HGBA1C 7.1 (H) 06/20/2019   Lab Results  Component Value Date   MICROALBUR 2.9 (H) 02/07/2017   LDLCALC 32 01/12/2021   CREATININE 0.94 01/12/2021   Lab Results  Component Value Date   NA 137 01/12/2021   K 3.8 01/12/2021   CREATININE 0.94 01/12/2021   EGFR 89 01/12/2021   GFRNONAA 90 11/18/2019   GLUCOSE 262 (H) 01/12/2021   Lab Results  Component Value Date   WBC 11.7 (H) 01/12/2021   HGB 14.1 01/12/2021   HCT 41.2 01/12/2021   MCV 94 01/12/2021   PLT 206 01/12/2021  -Uncontrolled -Current medications: Metformin 50031mR Rybelsus 7mg64m -Medications previously tried: N/A  -Current home glucose readings fasting glucose: Didn't have list post prandial glucose: Doesn't test -Denies hypoglycemic/hyperglycemic symptoms -Current meal patterns: Didn't specify -Current exercise: None -Educated on Complications of diabetes including kidney damage, retinal damage, and cardiovascular disease; -Counseled to check feet daily and get yearly eye exams November 2022: Denied Hypoglycemia issues  Tobacco use (Goal Cessation) Tobacco Use: High Risk   Smoking Tobacco Use:  Every Day   Smokeless Tobacco Use: Never   Passive Exposure: Not on file   Social History   Tobacco Use  Smoking Status Every Day   Packs/day: 1.00   Years: 25.00   Pack years: 25.00   Types: Cigarettes  Smokeless Tobacco Never  -Uncontrolled -Previous quit attempts:  -Current treatment  None -On a scale of 1-10, reports MOTIVATION to quit is 0 -On a scale of 1-10, reports CONFIDENCE in quitting is 0 -Provided contact information for [place] [facility]uit Line (1-800-QUIT-NOW) and encouraged patient to reach out to this group for support. -Recommended to continue current medication   Meds: -[facility] and this doesn't match up with his Epic medslist Plan: -Coordinate with providers to get correct medslist -Verbal consent obtained for [facility] enhanced pharmacy services (medication synchronization, adherence packaging, delivery coordination). A medication sync plan was created to allow patient to get all medications delivered once every 30 to 90 days per patient preference. Patient understands they have freedom to choose pharmacy and clinical pharmacist will coordinate care between all prescribers and [facility]. -[facility] Goals/Self-Care Activities Patient will:  - take medications as prescribed as evidenced by patient report and record review  Follow Up Plan: The patient has been provided with contact information for the care management team and has been advised to call with any health related questions or concerns.   NathArizona Constablearm.D. - [phone]   CPP F/U Feb 2023       Medication Assistance: None required.  Patient affirms current coverage meets needs.  Compliance/Adherence/Medication fill history: Star Rating  Drugs:  Rybelsus 7 mg- Last filled 01-26-2021 30 DS [facility] Metformin 500 mg- Last filled 11-24-2020 90 DS [facility] Lisinopril 20 mg- Last  filled 01-19-2021 90 DS [facility] Glimepiride 4 mg- Last filled 11-19-2020 90 DS [facility] Atorvastatin 20 mg- Last filled    Care Gaps: Last annual wellness visit? If applicable: Last eye exam / retinopathy screening? Last diabetic foot exam?  Patient's preferred pharmacy is:  [facility] [address] [place] [address] Phone: [phone] Fax: [phone]  Uses pill box? No -   Pt endorses 50% compliance  We discussed: Benefits of medication synchronization, packaging and delivery as well as enhanced pharmacist oversight with [facility]. Patient decided to: Utilize [facility] for medication synchronization, packaging and delivery  Medication Name                        (please note if Rx is PRN) Prescriber                                                                  (list Provider Name & Phone Number)                                  Timing    Refill Timing Last Fill Date & DS       (if last fill/DS unavailable, list pt.'s quantity on hand) Anticipated next due date    BB B L EM BT   Days Supply   ASA 65m     1  Cycle Fill First Packs please   Atorvastatin 254mor Rosuvastatin 1034mBraHarold [id number] 1  Cycle Fill 03/09/2021 90.00 06/07/21  Gabapentin 300m13mD PerrHenrene [phone]    PRN 03/09/2021 30.00 04/08/21  Glimepiride 4mg 13mryHenrene [phone]  Cycle Fill 02/19/2021 90.00 05/20/21  HCTZ 12.5mg B73mhaHarold Barban-- [phone] Cycle Fill 03/12/2021 90.00 06/10/21  Lisinopril 20mg B5mamHarold Barban6- [phone] Fill 12/22/2020 90.00 03/22/21  Metformin 500mg ER29mry - [name] Fill 02/19/2021 90.00 05/20/21  Rybelsus 7mg Perr64m 3[name] Fill 02/19/2021 30.00 03/21/21  Clopidogrel 75mg (Dep67mng on Cardio response) Brabham, VHarold Barban-- [phone] Fill 03/09/2021 90.00 06/07/21  Verbal consent obtained for [facility] enhanced  pharmacy services (medication synchronization, adherence packaging, delivery coordination). A medication sync plan was created to allow patient to get all medications delivered once every 30 to 90 days per patient preference. Patient understands they have freedom to choose pharmacy and clinical pharmacist will coordinate care between all prescribers and [facility].   Care Plan and Follow Up Patient Decision:  Patient agrees to Care Plan and Follow-up.  Plan: The patient has been provided with contact information for the care management team and has been advised to call with any health related questions or concerns.   Kelsie Zaborowski KenArizona Constable - [phone]   CPP F/U Feb 2023

## 2021-03-14 NOTE — Telephone Encounter (Signed)
Returned call to [employer], she states that pt has been taking atorvastatin and rosuvastatin, both are on discharge medication list.. She has told him to stop for now and she would get further direction from [place]. Looks like at recent hosp visit he was started on rosuvastatin and they did not add this to his list because on the f/u visit after this admission it does not mention rosuvastatin. So, which should he he take? Please advise.

## 2021-03-14 NOTE — Patient Instructions (Signed)
Visit Information   Goals Addressed             This Visit's Progress    Set My Target A1C-Diabetes Type 2       Timeframe:  Long-Range Goal Priority:  High Start Date:                             Expected End Date:                       Follow Up Date 03/14/22   - set target A1C    Why is this important?   Your target A1C is decided together by you and your doctor.  It is based on several things like your age and other health issues.    Notes:      Track and Manage My Blood Pressure-Hypertension       Timeframe:  Long-Range Goal Priority:  High Start Date:                             Expected End Date:                       Follow Up Date 03/14/22   - choose a place to take my blood pressure (home, clinic or office, retail store) - write blood pressure results in a log or diary    Why is this important?   You won't feel high blood pressure, but it can still hurt your blood vessels.  High blood pressure can cause heart or kidney problems. It can also cause a stroke.  Making lifestyle changes like losing a little weight or eating less salt will help.  Checking your blood pressure at home and at different times of the day can help to control blood pressure.  If the doctor prescribes medicine remember to take it the way the doctor ordered.  Call the office if you cannot afford the medicine or if there are questions about it.     Notes:        Patient Care Plan: CCM Pharmacy Care Plan     Problem Identified: DM, HTN, Pain   Priority: High  Onset Date: 03/14/2021     Goal: Disease State Management   Start Date: 03/14/2021  Expected End Date: 03/14/2022  This Visit's Progress: On track  Priority: High  Note:   Current Barriers:  Unable to self administer medications as prescribed  Pharmacist Clinical Goal(s):  Patient will contact provider office for questions/concerns as evidenced notation of same in electronic health record through collaboration with PharmD  and provider.   Interventions: 1:1 collaboration with [name], MD regarding development and update of comprehensive plan of care as evidenced by provider attestation and co-signature Inter-disciplinary care team collaboration (see longitudinal plan of care) Comprehensive medication review performed; medication list updated in electronic medical record  Hypertension (BP goal <140/90) BP Readings from Last 3 Encounters:  03/07/21 109/67  01/26/21 132/62  01/19/21 111/68  -Controlled -Current treatment: Lisinopril 20mg  QD HCTZ 12.5mg  QD -Medications previously tried: Amlodipine  -Current home readings: Doesn't test -Current dietary habits: "Tries to eat healthy" -Current exercise habits: None -Denies hypotensive/hypertensive symptoms -Educated on BP goals and benefits of medications for prevention of heart attack, stroke and kidney damage; -Counseled to monitor BP at home as needed, document, and provide log at future appointments -Recommended  to continue current medication  CAD -Unknown control -Current treatment  ASA 81mg  Clopidogrel 75mg  -Medications previously tried: N/A  November 2022: Per [place]a P[place] and note, only on ASA 81mg . Patient said [facility] told him to take both, will let Cardio know and alert patient based on response   Hyperlipidemia: (LDL goal < 70) The ASCVD Risk score (Arnett DK, et al., 2019) failed to calculate for the following reasons:   The valid total cholesterol range is 130 to 320 mg/dL Lab Results  Component Value Date   CHOL 88 (L) 01/12/2021   CHOL 150 11/18/2019   CHOL 148 06/20/2019   Lab Results  Component Value Date   HDL 27 (L) 01/12/2021   HDL 33 (L) 11/18/2019   HDL 30 (L) 06/20/2019   Lab Results  Component Value Date   LDLCALC 32 01/12/2021   LDLCALC 97 11/18/2019   LDLCALC 91 06/20/2019   Lab Results  Component Value Date   TRIG 175 (H) 01/12/2021   TRIG 108 11/18/2019   TRIG 151 (H) 06/20/2019   Lab  Results  Component Value Date   CHOLHDL 3.3 01/12/2021   CHOLHDL 4.5 11/18/2019   CHOLHDL 4.9 06/20/2019   Lab Results  Component Value Date   LDLDIRECT 113.0 12/20/2017   LDLDIRECT 110.0 06/07/2016   LDLDIRECT 118.9 03/06/2012  -Controlled -Current treatment: Atorvastatin 20mg  Rosuvastatin 10mg  -Medications previously tried: N/A  -Current home readings: Doesn't test -Current dietary habits: "Tries to eat healthy" -Educated on Cholesterol goals;  November 2022: Patient was taking both statins, [facility] told him this month to only take Rosuvastatin, Cardio note has Atorvastatin. Sent msg to Cardio asking them to clarify  Diabetes (A1c goal <8%) Lab Results  Component Value Date   HGBA1C 8.2 (H) 01/12/2021   HGBA1C 7.3 (H) 11/18/2019   HGBA1C 7.1 (H) 06/20/2019   Lab Results  Component Value Date   MICROALBUR 2.9 (H) 02/07/2017   LDLCALC 32 01/12/2021   CREATININE 0.94 01/12/2021   Lab Results  Component Value Date   NA 137 01/12/2021   K 3.8 01/12/2021   CREATININE 0.94 01/12/2021   EGFR 89 01/12/2021   GFRNONAA 90 11/18/2019   GLUCOSE 262 (H) 01/12/2021   Lab Results  Component Value Date   WBC 11.7 (H) 01/12/2021   HGB 14.1 01/12/2021   HCT 41.2 01/12/2021   MCV 94 01/12/2021   PLT 206 01/12/2021  -Uncontrolled -Current medications: Metformin 500mg  ER Rybelsus 7mg  QD -Medications previously tried: N/A  -Current home glucose readings fasting glucose: Didn't have list post prandial glucose: Doesn't test -Denies hypoglycemic/hyperglycemic symptoms -Current meal patterns: Didn't specify -Current exercise: None -Educated on Complications of diabetes including kidney damage, retinal damage, and cardiovascular disease; -Counseled to check feet daily and get yearly eye exams November 2022: Denied Hypoglycemia issues  Tobacco use (Goal Cessation) Tobacco Use: High Risk   Smoking Tobacco Use: Every Day   Smokeless Tobacco Use: Never   Passive Exposure: Not  on file   Social History   Tobacco Use  Smoking Status Every Day   Packs/day: 1.00   Years: 25.00   Pack years: 25.00   Types: Cigarettes  Smokeless Tobacco Never  -Uncontrolled -Previous quit attempts:  -Current treatment  None -On a scale of 1-10, reports MOTIVATION to quit is 0 -On a scale of 1-10, reports CONFIDENCE in quitting is 0 -Provided contact information for [place] [facility]uit [facility] (1-800-QUIT-NOW) and encouraged patient to reach out to this group for support. -Recommended to continue current medication  Meds: -Patient having trouble with meds from [facility], they told him to take things and this doesn't match up with his Epic medslist Plan: -Coordinate with providers to get correct medslist -Verbal consent obtained for [facility] enhanced pharmacy services (medication synchronization, adherence packaging, delivery coordination). A medication sync plan was created to allow patient to get all medications delivered once every 30 to 90 days per patient preference. Patient understands they have freedom to choose pharmacy and clinical pharmacist will coordinate care between all prescribers and [facility]. -Will [facility] get C[facility]   Patient Goals/Self-Care Activities Patient will:  - take medications as prescribed as evidenced by patient report and record review  Follow Up Plan: The patient has been provided with contact information for the care management team and has been advised to call with any health related questions or concerns.   [place] Constable, Pharm.D. - [phone]   CPP F/U Feb 2023      Mr. Nelis was given information about Chronic Care Management services today including:  CCM service includes personalized support from designated clinical staff supervised by his physician, including individualized plan of care and coordination with other care providers 24/7 contact phone numbers for assistance for urgent and routine care needs. Standard  insurance, coinsurance, copays and deductibles apply for chronic care management only during months in which we provide at least 20 minutes of these services. Most insurances cover these services at 100%, however patients may be responsible for any copay, coinsurance and/or deductible if applicable. This service may help you avoid the need for more expensive face-to-face services. Only one practitioner may furnish and bill the service in a calendar month. The patient may stop CCM services at any time (effective at the end of the month) by phone call to the office staff.  Patient agreed to services and verbal consent obtained.   The patient verbalized understanding of instructions, educational materials, and care plan provided today and declined offer to receive copy of patient instructions, educational materials, and care plan.  The pharmacy team will reach out to the patient again over the next 90 days.   Lane Hacker, [facility]

## 2021-03-14 NOTE — Telephone Encounter (Signed)
Pt c/o medication issue:  1. Name of Medication: atorvastatin (LIPITOR) 20 MG tablet  Rosuvastatin  2. How are you currently taking this medication (dosage and times per day)? Take 20 mg by mouth at bedtime.  3. Are you having a reaction (difficulty breathing--STAT)? no  4. What is your medication issue? Pharmacy called to say that patient is taking both medication. But they shouldn't be taking both. Please advise

## 2021-03-15 ENCOUNTER — Telehealth: Payer: Self-pay

## 2021-03-15 MED ORDER — ATORVASTATIN CALCIUM 20 MG PO TABS: 20.0000 mg | ORAL_TABLET | Freq: Every evening | ORAL | 3 refills | Status: DC

## 2021-03-15 NOTE — Addendum Note (Signed)
Addended by: Reynolds Bowl on: 03/15/2021 11:04 AM   Modules accepted: Orders

## 2021-03-15 NOTE — Telephone Encounter (Signed)
Pt returned my call. He is made aware he is to take Lipitor instead of Crestor. Pt states he still takes Plavix. Pt instructed to contact Dr. Lear Ng to get further instructions on him taking Plavix. Pt given the phone number.

## 2021-03-15 NOTE — Telephone Encounter (Signed)
Called pt to let him know which medication to stop taking, no answer at this time. Left a message for him to return the call.

## 2021-03-15 NOTE — Telephone Encounter (Signed)
Pt called to ask if he needed to continue Plavix. He just got a 90 day refill and had seen his heart doctor who was asking him to confirm that he needed to remain on it at this point. Per APP, pt is to continue taking until his next appt which is due early next year  (3 months from now). This was relayed to pt who verbalized understanding. No further questions/concerns at this time.

## 2021-03-15 NOTE — Telephone Encounter (Signed)
Called pharmacy to let them know pt is to only take Lipitor per Dr. Mallory Shirk recommendations. Spoke with staff, they will update his list.

## 2021-03-29 DIAGNOSIS — Z1389 Encounter for screening for other disorder: Secondary | ICD-10-CM | POA: Diagnosis not present

## 2021-03-29 DIAGNOSIS — Z79891 Long term (current) use of opiate analgesic: Secondary | ICD-10-CM | POA: Diagnosis not present

## 2021-03-29 DIAGNOSIS — G894 Chronic pain syndrome: Secondary | ICD-10-CM | POA: Diagnosis not present

## 2021-03-29 DIAGNOSIS — M5136 Other intervertebral disc degeneration, lumbar region: Secondary | ICD-10-CM | POA: Diagnosis not present

## 2021-03-29 DIAGNOSIS — M5137 Other intervertebral disc degeneration, lumbosacral region: Secondary | ICD-10-CM | POA: Diagnosis not present

## 2021-03-29 DIAGNOSIS — M47816 Spondylosis without myelopathy or radiculopathy, lumbar region: Secondary | ICD-10-CM | POA: Diagnosis not present

## 2021-03-29 DIAGNOSIS — M549 Dorsalgia, unspecified: Secondary | ICD-10-CM | POA: Diagnosis not present

## 2021-03-29 DIAGNOSIS — M48061 Spinal stenosis, lumbar region without neurogenic claudication: Secondary | ICD-10-CM | POA: Diagnosis not present

## 2021-04-05 ENCOUNTER — Encounter: Payer: Self-pay | Admitting: Legal Medicine

## 2021-04-05 ENCOUNTER — Ambulatory Visit (INDEPENDENT_AMBULATORY_CARE_PROVIDER_SITE_OTHER): Payer: PPO | Admitting: Legal Medicine

## 2021-04-05 VITALS — BP 140/72 | HR 62 | Temp 97.7°F | Resp 18 | Ht 70.0 in | Wt 145.0 lb

## 2021-04-05 DIAGNOSIS — R051 Acute cough: Secondary | ICD-10-CM | POA: Diagnosis not present

## 2021-04-05 DIAGNOSIS — J180 Bronchopneumonia, unspecified organism: Secondary | ICD-10-CM | POA: Diagnosis not present

## 2021-04-05 LAB — POC COVID19 BINAXNOW: SARS Coronavirus 2 Ag: NEGATIVE

## 2021-04-05 MED ORDER — DOXYCYCLINE HYCLATE 100 MG PO TABS: 100.0000 mg | ORAL_TABLET | Freq: Two times a day (BID) | ORAL | 0 refills | Status: DC

## 2021-04-05 MED ORDER — PREDNISONE 10 MG (21) PO TBPK: ORAL_TABLET | ORAL | 0 refills | Status: DC

## 2021-04-05 NOTE — Progress Notes (Signed)
Established Patient Office Visit  Subjective:  Patient ID: Andrew Wiggins, male    DOB: 28-Feb-1956  Age: 65 y.o. MRN: [id number]  CC:  Chief Complaint  Patient presents with   Cough    HPI Andrew Wiggins presents for cough and congestion for one week, negative Covid and flu.  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 ~  Jan11:  Last seen by Cards & pacer reprogrammed...     Cardiac pacemaker in situ    Carotid artery disease ([place]) 07/27/2010   R/O PERIPHERAL VASCULAR DISEASE (ICD-443.9) - on ASA $Remo'81mg'NkFKa$ /d... exam 11/09 shows gr 1-2 sys murmur LSB, but also has prominent bruit to base of right side of his neck w/ right > left Carotid Bruit... ~  CDopplers 11/09 showed severe plaque right ICA, & min plaque on left... no signif ICA stenoses per report. ~  f/u CDopplers 12/10 showed mild irreg plaque bilat in bulbs & intimal thickening in righ   COPD (chronic obstructive pulmonary disease) with chronic bronchitis ([place]) 03/26/2008   CHRONIC OBSTRUCTIVE PULMONARY DISEASE (ICD-496) - hx recurrent bronchitic episodes in the past... smokes 1/2 - 1ppd x yrs... ~  PFT's 11/08 showed FVC= 3.85 (79%), FEV1= 2.19 (55%), %1sec= 57, mid-flows= 32% predicted:  all c/w mod airflow obstruction...  ~  CXR 12/10 showed left subclav pacer, mild hyperinflation, NAD...     Dermatitis, contact    due to poison ivy   Diabetes mellitus with neuropathy ([place]) 12/20/2016   Dyslipidemia 03/06/2012   Essential hypertension 04/10/2007    HYPERTENSION (ICD-401.9) - prev on Micardis $RemoveBef'80mg'fQEhIJTfLg$ /d then switched to Losartan $RemoveBef'100mg'jlfxnWsMKo$ /d, but he lost his job in 2011& couldn't afford Rx;  He called [place] 2/12 w/ this news & we phoned in Lisinopril $RemoveBefor'20mg'CNhHyJhrmtbu$ /d... ~  2DEcho 11/09  showed sl incr LVwall thickness, norm LVF w/ EF= 55-60%, no regional wall motion abn, mild DD, mild MR... ~  Myoview 12/09 showed LBBB, no perfusion defects, abn septal motion & EF   GERD 03/26/2008    GERD (ICD-530.81) - prev mild GERD symptoms that improved after cholecystectomy on 2000... uses OTC PPI as needed... ~  he is due for routine screening colonoscopy...       GERD (gastroesophageal reflux disease)    Headache(784.0)    Hypertension    Mixed hyperlipidemia 06/19/2019   Peripheral vascular disease ([place])    Sciatica 11/18/2019   Tobacco use disorder     Past Surgical History:  Procedure Laterality Date   ABDOMINAL AORTOGRAM W/LOWER EXTREMITY N/A 12/14/2020   Procedure: ABDOMINAL AORTOGRAM W/LOWER EXTREMITY;  Surgeon: [name], MD;  Location: [facility];  Service: Cardiovascular;  Laterality: N/A;   laproscopic cholecystectomy  2000   by Dr. Abran Cantor   PACEMAKER INSERTION  03/2008   by Dr. Lovena Le   PERIPHERAL VASCULAR INTERVENTION Left 12/14/2020   Procedure: [place];  Surgeon: [name], MD;  Location: [facility];  Service: Cardiovascular;  Laterality: Left;  SFA    Family History  Problem Relation Age of Onset   Diabetes Brother     Social History   Socioeconomic History   Marital status: Legally Separated    Spouse name: Not  on file   Number of children: 2   Years of education: Not on file   Highest education level: Not on file  Occupational History   Occupation: unemployed    Comment: works for himself  Tobacco Use   Smoking status: Every Day    Packs/day: 1.00    Years: 25.00    Pack years: 25.00    Types: Cigarettes   Smokeless tobacco: Never  Vaping Use   Vaping Use: Never used  Substance and Sexual Activity   Alcohol use: Not Currently    Alcohol/week: 0.0 standard drinks   Drug use: No   Sexual activity: Not Currently  Other Topics Concern   Not on file  Social History Narrative   2 siblings  died with leukemia around age 64   Social Determinants of Health   Financial Resource Strain: Low Risk    Difficulty of Paying Living Expenses: Not hard at all  Food Insecurity: No Food Insecurity   Worried About Charity fundraiser in the Last Year: Never true   Arboriculturist in the Last Year: Never true  Transportation Needs: No Transportation Needs   Lack of Transportation (Medical): No   Lack of Transportation (Non-Medical): No  Physical Activity: Inactive   Days of Exercise per Week: 0 days   Minutes of Exercise per Session: 30 min  Stress: Stress Concern Present   Feeling of Stress : To some extent  Social Connections: Socially Isolated   Frequency of Communication with Friends and Family: Three times a week   Frequency of Social Gatherings with Friends and Family: Never   Attends Religious Services: Never   Marine scientist or Organizations: No   Attends Music therapist: Never   Marital Status: Divorced  Human resources officer Violence: Not At Risk   Fear of Current or Ex-Partner: No   Emotionally Abused: No   Physically Abused: No   Sexually Abused: No    Outpatient Medications Prior to Visit  Medication Sig Dispense Refill   aspirin EC 81 MG tablet Take 1 tablet (81 mg total) by mouth daily. Swallow whole. 150 tablet 2   Aspirin-Acetaminophen-Caffeine (GOODY HEADACHE PO) Take 1 Package by mouth daily as needed (headache).     clopidogrel (PLAVIX) 75 MG tablet Take 75 mg by mouth daily.     gabapentin (NEURONTIN) 300 MG capsule Take 1 capsule (300 mg total) by mouth 3 (three) times daily. 90 capsule 3   glimepiride (AMARYL) 4 MG tablet Take 1 tablet by mouth once daily 90 tablet 2   hydrochlorothiazide (MICROZIDE) 12.5 MG capsule Take 1 capsule (12.5 mg total) by mouth daily. 90 capsule 3   HYDROcodone-acetaminophen (NORCO) 10-325 MG tablet Take 1 tablet by mouth 3 (three) times daily.     lisinopril (ZESTRIL) 20 MG tablet TAKE 1 TABLET BY MOUTH IN THE  MORNING AND AT BEDTIME 180 tablet 3   metFORMIN (GLUCOPHAGE-XR) 500 MG 24 hr tablet TAKE 1 TABLET BY MOUTH ONCE DAILY WITH EVENING MEAL 90 tablet 2   rosuvastatin (CRESTOR) 10 MG tablet Take 10 mg by mouth daily.     Semaglutide (RYBELSUS) 7 MG TABS Take 7 mg by mouth daily. 30 tablet 6   atorvastatin (LIPITOR) 20 MG tablet Take 1 tablet (20 mg total) by mouth at bedtime. 90 tablet 3   No facility-administered medications prior to visit.    No Known Allergies  ROS Review of Systems  Constitutional:  Negative for activity change and appetite  change.  HENT:  Negative for congestion.   Respiratory:  Positive for cough and chest tightness.   Cardiovascular:  Negative for chest pain, palpitations and leg swelling.  Gastrointestinal:  Negative for abdominal distention and abdominal pain.  Genitourinary:  Negative for difficulty urinating and dysuria.  Musculoskeletal:  Negative for arthralgias and back pain.  Neurological: Negative.   Psychiatric/Behavioral: Negative.       Objective:    Physical Exam Vitals reviewed.  Constitutional:      General: He is in acute distress.     Appearance: Normal appearance.  HENT:     Head: Normocephalic.     Right Ear: Tympanic membrane, ear canal and external ear normal.     Left Ear: Tympanic membrane, ear canal and external ear normal.     Mouth/Throat:     Mouth: Mucous membranes are moist.     Pharynx: Oropharynx is clear.  Eyes:     Extraocular Movements: Extraocular movements intact.     Conjunctiva/sclera: Conjunctivae normal.     Pupils: Pupils are equal, round, and reactive to light.  Cardiovascular:     Rate and Rhythm: Normal rate and regular rhythm.     Pulses: Normal pulses.     Heart sounds: Normal heart sounds. No murmur heard.   No gallop.  Pulmonary:     Effort: Pulmonary effort is normal. No respiratory distress.     Breath sounds: Normal breath sounds. No wheezing.  Abdominal:     General: Abdomen is flat. Bowel  sounds are normal. There is no distension.     Palpations: Abdomen is soft.     Tenderness: There is no abdominal tenderness.  Musculoskeletal:        General: Normal range of motion.     Right lower leg: No edema.     Left lower leg: No edema.  Skin:    General: Skin is warm.     Capillary Refill: Capillary refill takes less than 2 seconds.  Neurological:     General: No focal deficit present.     Mental Status: He is alert and oriented to person, place, and time. Mental status is at baseline.    BP 140/72   Pulse 62   Temp 97.7 F (36.5 C)   Resp 18   Ht $R'5\' 10"'ZV$  (1.778 m)   Wt 145 lb (65.8 kg)   SpO2 98%   BMI 20.81 kg/m  Wt Readings from Last 3 Encounters:  04/05/21 145 lb (65.8 kg)  03/07/21 147 lb 12.8 oz (67 kg)  01/26/21 143 lb 9.6 oz (65.1 kg)     Health Maintenance Due  Topic Date Due   COVID-19 Vaccine (1) Never done   Hepatitis C Screening  Never done   TETANUS/TDAP  Never done   COLONOSCOPY (Pts 45-2yrs Insurance coverage will need to be confirmed)  Never done   Zoster Vaccines- Shingrix (1 of 2) Never done   Pneumonia Vaccine 53+ Years old (2 - PCV) 04/15/2010   FOOT EXAM  11/17/2020   INFLUENZA VACCINE  12/06/2020    There are no preventive care reminders to display for this patient.  Lab Results  Component Value Date   TSH 1.46 12/20/2017   Lab Results  Component Value Date   WBC 11.7 (H) 01/12/2021   HGB 14.1 01/12/2021   HCT 41.2 01/12/2021   MCV 94 01/12/2021   PLT 206 01/12/2021   Lab Results  Component Value Date   NA 137 01/12/2021   K  3.8 01/12/2021   CO2 25 01/12/2021   GLUCOSE 262 (H) 01/12/2021   BUN 10 01/12/2021   CREATININE 0.94 01/12/2021   BILITOT 0.3 01/12/2021   ALKPHOS 103 01/12/2021   AST 17 01/12/2021   ALT 17 01/12/2021   PROT 6.7 01/12/2021   ALBUMIN 4.3 01/12/2021   CALCIUM 9.9 01/12/2021   EGFR 89 01/12/2021   GFR 83.08 12/20/2017   Lab Results  Component Value Date   CHOL 88 (L) 01/12/2021   Lab  Results  Component Value Date   HDL 27 (L) 01/12/2021   Lab Results  Component Value Date   LDLCALC 32 01/12/2021   Lab Results  Component Value Date   TRIG 175 (H) 01/12/2021   Lab Results  Component Value Date   CHOLHDL 3.3 01/12/2021   Lab Results  Component Value Date   HGBA1C 8.2 (H) 01/12/2021      Assessment & Plan:   Problem List Items Addressed This Visit   None Visit Diagnoses     Acute cough    -  Primary   Relevant Orders   POC COVID-19- negative   Bronchopneumonia       Relevant Medications   doxycycline (VIBRA-TABS) 100 MG tablet   predniSONE (STERAPRED UNI-PAK 21 TAB) 10 MG (21) TBPK tablet Patient has bronchopneumonia and will treat with doxycycline and prednisone       Meds ordered this encounter  Medications   doxycycline (VIBRA-TABS) 100 MG tablet    Sig: Take 1 tablet (100 mg total) by mouth 2 (two) times daily.    Dispense:  20 tablet    Refill:  0   predniSONE (STERAPRED UNI-PAK 21 TAB) 10 MG (21) TBPK tablet    Sig: Take 6ills first day , then 5 pills day 2 and then cut down one pill day until gone    Dispense:  21 tablet    Refill:  0    Follow-up: Return if symptoms worsen or fail to improve.    Reinaldo Meeker, MD

## 2021-04-06 DIAGNOSIS — Z7984 Long term (current) use of oral hypoglycemic drugs: Secondary | ICD-10-CM

## 2021-04-06 DIAGNOSIS — E1169 Type 2 diabetes mellitus with other specified complication: Secondary | ICD-10-CM | POA: Diagnosis not present

## 2021-04-06 DIAGNOSIS — I10 Essential (primary) hypertension: Secondary | ICD-10-CM

## 2021-04-06 DIAGNOSIS — E782 Mixed hyperlipidemia: Secondary | ICD-10-CM

## 2021-04-07 ENCOUNTER — Ambulatory Visit (INDEPENDENT_AMBULATORY_CARE_PROVIDER_SITE_OTHER): Payer: PPO

## 2021-04-07 DIAGNOSIS — I442 Atrioventricular block, complete: Secondary | ICD-10-CM

## 2021-04-11 ENCOUNTER — Telehealth: Payer: Self-pay

## 2021-04-11 LAB — CUP PACEART REMOTE DEVICE CHECK
Battery Impedance: 8753 Ohm
Battery Voltage: 2.61 V
Brady Statistic RV Percent Paced: 100 %
Date Time Interrogation Session: 20221202165403
Implantable Lead Implant Date: 20091120
Implantable Lead Implant Date: 20091120
Implantable Lead Location: 753859
Implantable Lead Location: 753860
Implantable Lead Model: 5076
Implantable Lead Model: 5076
Implantable Pulse Generator Implant Date: 20091120
Lead Channel Impedance Value: 67 Ohm
Lead Channel Impedance Value: 975 Ohm
Lead Channel Setting Pacing Amplitude: 2 V
Lead Channel Setting Pacing Pulse Width: 0.4 ms
Lead Channel Setting Sensing Sensitivity: 5.6 mV

## 2021-04-11 NOTE — Telephone Encounter (Signed)
Left detailed VM as indicated by DPR on file that patients pacemaker has reached ERI and that I will be sending a message to Dory Horn Dr. Estill Dooms nurse to scheduled patient for generator placement. Direct number to device clinic given. Per Dr.Camnitz note on 03/07/21 risks and benefits of generator placement has been discussed.

## 2021-04-12 ENCOUNTER — Telehealth: Payer: Self-pay

## 2021-04-12 NOTE — Telephone Encounter (Signed)
The patient would like for Dr. Elberta Fortis nurse to give him a call back.

## 2021-04-14 NOTE — Telephone Encounter (Signed)
Pt aware I will confirm date of PPM ERI and then call to arrange gen change. Aware I will be after this week before a return call. Patient verbalized understanding and agreeable to plan.    Spoke to device clinic, ERI 03/10/2021

## 2021-04-19 DIAGNOSIS — Z79891 Long term (current) use of opiate analgesic: Secondary | ICD-10-CM | POA: Diagnosis not present

## 2021-04-19 DIAGNOSIS — M5137 Other intervertebral disc degeneration, lumbosacral region: Secondary | ICD-10-CM | POA: Diagnosis not present

## 2021-04-19 DIAGNOSIS — G894 Chronic pain syndrome: Secondary | ICD-10-CM | POA: Diagnosis not present

## 2021-04-19 DIAGNOSIS — Z1389 Encounter for screening for other disorder: Secondary | ICD-10-CM | POA: Diagnosis not present

## 2021-04-19 DIAGNOSIS — M48061 Spinal stenosis, lumbar region without neurogenic claudication: Secondary | ICD-10-CM | POA: Diagnosis not present

## 2021-04-19 DIAGNOSIS — M5136 Other intervertebral disc degeneration, lumbar region: Secondary | ICD-10-CM | POA: Diagnosis not present

## 2021-04-19 DIAGNOSIS — M549 Dorsalgia, unspecified: Secondary | ICD-10-CM | POA: Diagnosis not present

## 2021-04-19 DIAGNOSIS — M47816 Spondylosis without myelopathy or radiculopathy, lumbar region: Secondary | ICD-10-CM | POA: Diagnosis not present

## 2021-04-19 NOTE — Addendum Note (Signed)
Addended by: Elease Etienne A on: 04/19/2021 09:51 AM   Modules accepted: Level of Service

## 2021-04-19 NOTE — Progress Notes (Signed)
Remote pacemaker transmission.   

## 2021-05-10 ENCOUNTER — Ambulatory Visit (INDEPENDENT_AMBULATORY_CARE_PROVIDER_SITE_OTHER): Payer: PPO

## 2021-05-10 DIAGNOSIS — I442 Atrioventricular block, complete: Secondary | ICD-10-CM

## 2021-05-11 LAB — CUP PACEART REMOTE DEVICE CHECK
Battery Impedance: 9203 Ohm
Battery Voltage: 2.59 V
Brady Statistic RV Percent Paced: 100 %
Date Time Interrogation Session: 20230104133432
Implantable Lead Implant Date: 20091120
Implantable Lead Implant Date: 20091120
Implantable Lead Location: 753859
Implantable Lead Location: 753860
Implantable Lead Model: 5076
Implantable Lead Model: 5076
Implantable Pulse Generator Implant Date: 20091120
Lead Channel Impedance Value: 67 Ohm
Lead Channel Impedance Value: 967 Ohm
Lead Channel Setting Pacing Amplitude: 2 V
Lead Channel Setting Pacing Pulse Width: 0.4 ms
Lead Channel Setting Sensing Sensitivity: 5.6 mV

## 2021-05-11 NOTE — Telephone Encounter (Signed)
Nob-billable monthly transmission received.  Device reached ERI on 03/10/21.  Patient is device dependant based on 03/07/21 in-clinic check.    Device is running in back-up mode: VVI 65.   Forwarding to Dr. Curt Bears nurse to schedule procedure.  Next remote check is due 06/10/21, battery may not last that long.

## 2021-05-12 ENCOUNTER — Telehealth: Payer: Self-pay | Admitting: *Deleted

## 2021-05-12 DIAGNOSIS — Z01812 Encounter for preprocedural laboratory examination: Secondary | ICD-10-CM

## 2021-05-12 DIAGNOSIS — I442 Atrioventricular block, complete: Secondary | ICD-10-CM

## 2021-05-12 NOTE — Telephone Encounter (Signed)
Left message to call back to schedule PPM gen change this month

## 2021-05-13 NOTE — Telephone Encounter (Signed)
Left message to call back  

## 2021-05-16 ENCOUNTER — Telehealth: Payer: Self-pay | Admitting: *Deleted

## 2021-05-16 ENCOUNTER — Encounter: Payer: Self-pay | Admitting: *Deleted

## 2021-05-16 NOTE — Telephone Encounter (Signed)
Wife called stating patient's left foot is swollen and a burning sensation is noted.  She states that from the knee down his left leg is cold. I called the # back and had to leave a message on the answering machine. I expressed on the message that if his leg is cold we direct to the ED.

## 2021-05-16 NOTE — Telephone Encounter (Signed)
Pt returned my call Pt scheduled for PPM gen change on 1/18. Pt will stop by the [place] [facility] office this week for blood work and to pick up procedure letter of instructions. Reviewed procedure instructions verbally. Pt aware office will call to arrange post procedure follow up Patient verbalized understanding and agreeable to plan.

## 2021-05-16 NOTE — Telephone Encounter (Signed)
Patient's wife called and stated that patient can feel his leg, however the swelling and pain is still present.  An appointment for lab studies and office visit with PA was scheduled 05/17/2021.  Wife states that if condition worsens tonight she will take him to ED.

## 2021-05-17 ENCOUNTER — Other Ambulatory Visit: Payer: Self-pay

## 2021-05-17 ENCOUNTER — Ambulatory Visit (INDEPENDENT_AMBULATORY_CARE_PROVIDER_SITE_OTHER)
Admission: RE | Admit: 2021-05-17 | Discharge: 2021-05-17 | Disposition: A | Discharge status: Home / Self Care | Payer: PPO | Source: Ambulatory Visit | Attending: Vascular Surgery | Admitting: Vascular Surgery

## 2021-05-17 ENCOUNTER — Other Ambulatory Visit: Payer: Self-pay | Admitting: Legal Medicine

## 2021-05-17 ENCOUNTER — Ambulatory Visit: Payer: PPO | Admitting: Physician Assistant

## 2021-05-17 ENCOUNTER — Encounter: Payer: Self-pay | Admitting: Physician Assistant

## 2021-05-17 ENCOUNTER — Ambulatory Visit (HOSPITAL_COMMUNITY)
Admission: RE | Admit: 2021-05-17 | Discharge: 2021-05-17 | Disposition: A | Discharge status: Home / Self Care | Payer: PPO | Source: Ambulatory Visit | Attending: Physician Assistant | Admitting: Physician Assistant

## 2021-05-17 VITALS — BP 199/79 | HR 65 | Temp 98.3°F | Resp 18 | Ht 70.0 in | Wt 144.6 lb

## 2021-05-17 DIAGNOSIS — I739 Peripheral vascular disease, unspecified: Secondary | ICD-10-CM | POA: Insufficient documentation

## 2021-05-17 DIAGNOSIS — E0840 Diabetes mellitus due to underlying condition with diabetic neuropathy, unspecified: Secondary | ICD-10-CM

## 2021-05-17 DIAGNOSIS — I70222 Atherosclerosis of native arteries of extremities with rest pain, left leg: Secondary | ICD-10-CM | POA: Diagnosis not present

## 2021-05-17 NOTE — Progress Notes (Signed)
Office Note     CC:  follow up Requesting Provider:  [name],*  HPI: Andrew Wiggins is a 66 y.o. ([birth year]) male who presents for evaluation of left lower extremity pain.  He is status post left SFA stenting by Dr. Trula Slade due to a left toe ulcer in August 2022.  Patient states over the past 2-3 weeks he has had rest pain in his left foot overnight.  He also feels like he is walking on rocks.  He is on aspirin and Plavix daily.  He continues to smoke a pack a day.   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 ~  Jan11:  Last seen by Cards & pacer reprogrammed...     Cardiac pacemaker in situ    Carotid artery disease ([place]) 07/27/2010   R/O PERIPHERAL VASCULAR DISEASE (ICD-443.9) - on ASA 81mg /d... exam 11/09 shows gr 1-2 sys murmur LSB, but also has prominent bruit to base of right side of his neck w/ right > left Carotid Bruit... ~  CDopplers 11/09 showed severe plaque right ICA, & min plaque on left... no signif ICA stenoses per report. ~  f/u CDopplers 12/10 showed mild irreg plaque bilat in bulbs & intimal thickening in righ   COPD (chronic obstructive pulmonary disease) with chronic bronchitis ([place]) 03/26/2008   CHRONIC OBSTRUCTIVE PULMONARY DISEASE (ICD-496) - hx recurrent bronchitic episodes in the past... smokes 1/2 - 1ppd x yrs... ~  PFT's 11/08 showed FVC= 3.85 (79%), FEV1= 2.19 (55%), %1sec= 57, mid-flows= 32% predicted:  all c/w mod airflow obstruction...  ~  CXR 12/10 showed left subclav pacer, mild hyperinflation, NAD...     Dermatitis, contact    due to poison ivy   Diabetes mellitus with neuropathy ([place]) 12/20/2016   Dyslipidemia 03/06/2012   Essential hypertension 04/10/2007    HYPERTENSION  (ICD-401.9) - prev on Micardis 80mg /d then switched to Losartan 100mg /d, but he lost his job in 2011& couldn't afford Rx;  He called [place] 2/12 w/ this news & we phoned in Lisinopril 20mg /d... ~  2DEcho 11/09 showed sl incr LVwall thickness, norm LVF w/ EF= 55-60%, no regional wall motion abn, mild DD, mild MR... ~  Myoview 12/09 showed LBBB, no perfusion defects, abn septal motion & EF   GERD 03/26/2008    GERD (ICD-530.81) - prev mild GERD symptoms that improved after cholecystectomy on 2000... uses OTC PPI as needed... ~  he is due for routine screening colonoscopy...       GERD (gastroesophageal reflux disease)    Headache(784.0)    Hypertension    Mixed hyperlipidemia 06/19/2019   Peripheral vascular disease ([place])    Sciatica 11/18/2019   Tobacco use disorder     Past Surgical History:  Procedure Laterality Date   ABDOMINAL AORTOGRAM W/LOWER EXTREMITY N/A 12/14/2020   Procedure: ABDOMINAL AORTOGRAM W/LOWER EXTREMITY;  Surgeon: [name], MD;  Location: [facility];  Service: Cardiovascular;  Laterality: N/A;   laproscopic cholecystectomy  2000   by Dr. Abran Cantor   PACEMAKER INSERTION  03/2008   by Dr. Lovena Le   PERIPHERAL VASCULAR INTERVENTION Left 12/14/2020   Procedure: PERIPHERAL VASCULAR INTERVENTION;  Surgeon: [name], MD;  Location: [facility];  Service: Cardiovascular;  Laterality: Left;  SFA    Social History   Socioeconomic History   Marital status: Legally Separated    Spouse name: Not on file   Number of children: 2   Years of education: Not on file   Highest education level: Not on file  Occupational History   Occupation: unemployed    Comment: works for himself  Tobacco Use   Smoking status: Every Day    Packs/day: 1.00    Years: 25.00    Pack years: 25.00    Types: Cigarettes   Smokeless tobacco: Never  Vaping Use   Vaping Use: Never used  Substance and Sexual Activity   Alcohol use: Not Currently    Alcohol/week: 0.0 standard  drinks   Drug use: No   Sexual activity: Not Currently  Other Topics Concern   Not on file  Social History Narrative   2 siblings died with leukemia around age 74   Social Determinants of Health   Financial Resource Strain: Low Risk    Difficulty of Paying Living Expenses: Not hard at all  Food Insecurity: No Food Insecurity   Worried About Programme researcher, broadcasting/film/video in the Last Year: Never true   Barista in the Last Year: Never true  Transportation Needs: No Transportation Needs   Lack of Transportation (Medical): No   Lack of Transportation (Non-Medical): No  Physical Activity: Inactive   Days of Exercise per Week: 0 days   Minutes of Exercise per Session: 30 min  Stress: Stress Concern Present   Feeling of Stress : To some extent  Social Connections: Socially Isolated   Frequency of Communication with Friends and Family: Three times a week   Frequency of Social Gatherings with Friends and Family: Never   Attends Religious Services: Never   Database administrator or Organizations: No   Attends Engineer, structural: Never   Marital Status: Divorced  Catering manager Violence: Not At Risk   Fear of Current or Ex-Partner: No   Emotionally Abused: No   Physically Abused: No   Sexually Abused: No    Family History  Problem Relation Age of Onset   Diabetes Brother     Current Outpatient Medications  Medication Sig Dispense Refill   aspirin EC 81 MG tablet Take 1 tablet (81 mg total) by mouth daily. Swallow whole. 150 tablet 2   Aspirin-Acetaminophen-Caffeine (GOODY HEADACHE PO) Take 1 Package by mouth daily as needed (headache).     clopidogrel (PLAVIX) 75 MG tablet Take 75 mg by mouth daily.     doxycycline (VIBRA-TABS) 100 MG tablet Take 1 tablet (100 mg total) by mouth 2 (two) times daily. 20 tablet 0   gabapentin (NEURONTIN) 300 MG capsule TAKE 1 CAPSULE BY MOUTH THREE TIMES DAILY 90 capsule 3   glimepiride (AMARYL) 4 MG tablet Take 1 tablet by mouth once  daily 90 tablet 2   HYDROcodone-acetaminophen (NORCO) 10-325 MG tablet Take 1 tablet by mouth 3 (three) times daily.     lisinopril (ZESTRIL) 20 MG tablet TAKE 1 TABLET BY MOUTH IN THE MORNING AND AT BEDTIME 180 tablet 3   metFORMIN (GLUCOPHAGE-XR) 500 MG 24 hr tablet TAKE 1 TABLET BY MOUTH ONCE DAILY WITH EVENING MEAL 90 tablet 2   predniSONE (STERAPRED UNI-PAK 21 TAB) 10 MG (21) TBPK tablet Take 6ills first day , then 5 pills day 2 and then cut down one pill day until gone 21 tablet 0  rosuvastatin (CRESTOR) 10 MG tablet Take 10 mg by mouth daily.     Semaglutide (RYBELSUS) 7 MG TABS Take 7 mg by mouth daily. 30 tablet 6   hydrochlorothiazide (MICROZIDE) 12.5 MG capsule Take 1 capsule (12.5 mg total) by mouth daily. 90 capsule 3   No current facility-administered medications for this visit.    No Known Allergies   REVIEW OF SYSTEMS:   [X]  denotes positive finding, [ ]  denotes negative finding Cardiac  Comments:  Chest pain or chest pressure:    Shortness of breath upon exertion:    Short of breath when lying flat:    Irregular heart rhythm:        Vascular    Pain in calf, thigh, or hip brought on by ambulation:    Pain in feet at night that wakes you up from your sleep:     Blood clot in your veins:    Leg swelling:         Pulmonary    Oxygen at home:    Productive cough:     Wheezing:         Neurologic    Sudden weakness in arms or legs:     Sudden numbness in arms or legs:     Sudden onset of difficulty speaking or slurred speech:    Temporary loss of vision in one eye:     Problems with dizziness:         Gastrointestinal    Blood in stool:     Vomited blood:         Genitourinary    Burning when urinating:     Blood in urine:        Psychiatric    Major depression:         Hematologic    Bleeding problems:    Problems with blood clotting too easily:        Skin    Rashes or ulcers:        Constitutional    Fever or chills:      PHYSICAL  EXAMINATION:  Vitals:   05/17/21 1206  BP: (!) 199/79  Pulse: 65  Resp: 18  Temp: 98.3 F (36.8 C)  TempSrc: Temporal  SpO2: 97%  Weight: 144 lb 9.6 oz (65.6 kg)  Height: 5\' 10"  (1.778 m)    General:  WDWN in NAD; vital signs documented above Gait: Not observed HENT: WNL, normocephalic Pulmonary: normal non-labored breathing Cardiac: regular HR Abdomen: soft, NT, no masses Skin: without rashes Vascular Exam/Pulses: Absent pedal pulses; palpable right femoral pulse Extremities: Purple discoloration left fourth toe Musculoskeletal: no muscle wasting or atrophy  Neurologic: A&O X 3;  No focal weakness or paresthesias are detected Psychiatric:  The pt has Normal affect.   Non-Invasive Vascular Imaging:   Proximal left SFA stent with 436 cm/s velocity  Left      Lt Pressure (mmHg) Index Waveform            Comment   +---------+------------------+-----+-------------------+-------+   Brachial  186                                                    +---------+------------------+-----+-------------------+-------+   PTA       33                 0.18  dampened monophasic           +---------+------------------+-----+-------------------+-------+   DP        45                 0.24  dampened monophasic           +---------+------------------+-----+-------------------+-------+   ASSESSMENT/PLAN:: 66 y.o. male with critical limb ischemia with rest pain and tissue loss   -Subjectively patient has developed rest pain over the past 2-3 weeks and has a purple discoloration of his left fourth toe -Arterial duplex demonstrates a near occluded/occluded left SFA stent -Plan will be for aortogram with left lower extremity runoff and possible intervention with Dr. Trula Slade on 05/24/2021 -He will continue his aspirin and Plavix perioperatively -The above procedure including risks were discussed with the patient and he agrees to proceed -We also discussed the importance of smoking  cessation   Dagoberto Ligas, PA-C [facility] [phone]  Clinic MD:   Carlis Abbott

## 2021-05-17 NOTE — H&P (View-Only) (Signed)
Office Note     CC:  follow up Requesting Provider:  [name],*  HPI: Andrew Wiggins is a 66 y.o. ([birth year]) male who presents for evaluation of left lower extremity pain.  He is status post left SFA stenting by Dr. Trula Slade due to a left toe ulcer in August 2022.  Patient states over the past 2-3 weeks he has had rest pain in his left foot overnight.  He also feels like he is walking on rocks.  He is on aspirin and Plavix daily.  He continues to smoke a pack a day.   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 ~  Jan11:  Last seen by Cards & pacer reprogrammed...     Cardiac pacemaker in situ    Carotid artery disease ([place]) 07/27/2010   R/O PERIPHERAL VASCULAR DISEASE (ICD-443.9) - on ASA 81mg /d... exam 11/09 shows gr 1-2 sys murmur LSB, but also has prominent bruit to base of right side of his neck w/ right > left Carotid Bruit... ~  CDopplers 11/09 showed severe plaque right ICA, & min plaque on left... no signif ICA stenoses per report. ~  f/u CDopplers 12/10 showed mild irreg plaque bilat in bulbs & intimal thickening in righ   COPD (chronic obstructive pulmonary disease) with chronic bronchitis ([place]) 03/26/2008   CHRONIC OBSTRUCTIVE PULMONARY DISEASE (ICD-496) - hx recurrent bronchitic episodes in the past... smokes 1/2 - 1ppd x yrs... ~  PFT's 11/08 showed FVC= 3.85 (79%), FEV1= 2.19 (55%), %1sec= 57, mid-flows= 32% predicted:  all c/w mod airflow obstruction...  ~  CXR 12/10 showed left subclav pacer, mild hyperinflation, NAD...     Dermatitis, contact    due to poison ivy   Diabetes mellitus with neuropathy ([place]) 12/20/2016   Dyslipidemia 03/06/2012   Essential hypertension 04/10/2007    HYPERTENSION  (ICD-401.9) - prev on Micardis 80mg /d then switched to Losartan 100mg /d, but he lost his job in 2011& couldn't afford Rx;  He called [place] 2/12 w/ this news & we phoned in Lisinopril 20mg /d... ~  2DEcho 11/09 showed sl incr LVwall thickness, norm LVF w/ EF= 55-60%, no regional wall motion abn, mild DD, mild MR... ~  Myoview 12/09 showed LBBB, no perfusion defects, abn septal motion & EF   GERD 03/26/2008    GERD (ICD-530.81) - prev mild GERD symptoms that improved after cholecystectomy on 2000... uses OTC PPI as needed... ~  he is due for routine screening colonoscopy...       GERD (gastroesophageal reflux disease)    Headache(784.0)    Hypertension    Mixed hyperlipidemia 06/19/2019   Peripheral vascular disease ([place])    Sciatica 11/18/2019   Tobacco use disorder     Past Surgical History:  Procedure Laterality Date   ABDOMINAL AORTOGRAM W/LOWER EXTREMITY N/A 12/14/2020   Procedure: ABDOMINAL AORTOGRAM W/LOWER EXTREMITY;  Surgeon: [name], MD;  Location: [facility] [place] [facility];  Service: Cardiovascular;  Laterality: N/A;   laproscopic cholecystectomy  2000   by Dr. Abran Cantor   PACEMAKER INSERTION  03/2008   by Dr. Lovena Le   PERIPHERAL VASCULAR INTERVENTION Left 12/14/2020   Procedure: PERIPHERAL VASCULAR INTERVENTION;  Surgeon: [name], MD;  Location: [facility];  Service: Cardiovascular;  Laterality: Left;  SFA  ° ° °Social History  ° °Socioeconomic History  ° Marital status: Legally Separated  °  Spouse name: Not on file  ° Number of children: 2  ° Years of education: Not on file  ° Highest education level: Not on file  °Occupational History  ° Occupation: unemployed  °  Comment: works for himself  °Tobacco Use  ° Smoking status: Every Day  °  Packs/day: 1.00  °  Years: 25.00  °  Pack years: 25.00  °  Types: Cigarettes  ° Smokeless tobacco: Never  °Vaping Use  ° Vaping Use: Never used  °Substance and Sexual Activity  ° Alcohol use: Not Currently  °  Alcohol/week: 0.0 standard  drinks  ° Drug use: No  ° Sexual activity: Not Currently  °Other Topics Concern  ° Not on file  °Social History Narrative  ° 2 siblings died with leukemia around age 60  ° °Social Determinants of Health  ° °Financial Resource Strain: Low Risk   ° Difficulty of Paying Living Expenses: Not hard at all  °Food Insecurity: No Food Insecurity  ° Worried About Running Out of Food in the Last Year: Never true  ° Ran Out of Food in the Last Year: Never true  °Transportation Needs: No Transportation Needs  ° Lack of Transportation (Medical): No  ° Lack of Transportation (Non-Medical): No  °Physical Activity: Inactive  ° Days of Exercise per Week: 0 days  ° Minutes of Exercise per Session: 30 min  °Stress: Stress Concern Present  ° Feeling of Stress : To some extent  °Social Connections: Socially Isolated  ° Frequency of Communication with Friends and Family: Three times a week  ° Frequency of Social Gatherings with Friends and Family: Never  ° Attends Religious Services: Never  ° Active Member of Clubs or Organizations: No  ° Attends Club or Organization Meetings: Never  ° Marital Status: Divorced  °Intimate Partner Violence: Not At Risk  ° Fear of Current or Ex-Partner: No  ° Emotionally Abused: No  ° Physically Abused: No  ° Sexually Abused: No  ° ° °Family History  °Problem Relation Age of Onset  ° Diabetes Brother   ° ° °Current Outpatient Medications  °Medication Sig Dispense Refill  ° aspirin EC 81 MG tablet Take 1 tablet (81 mg total) by mouth daily. Swallow whole. 150 tablet 2  ° Aspirin-Acetaminophen-Caffeine (GOODY HEADACHE PO) Take 1 Package by mouth daily as needed (headache).    ° clopidogrel (PLAVIX) 75 MG tablet Take 75 mg by mouth daily.    ° doxycycline (VIBRA-TABS) 100 MG tablet Take 1 tablet (100 mg total) by mouth 2 (two) times daily. 20 tablet 0  ° gabapentin (NEURONTIN) 300 MG capsule TAKE 1 CAPSULE BY MOUTH THREE TIMES DAILY 90 capsule 3  ° glimepiride (AMARYL) 4 MG tablet Take 1 tablet by mouth once  daily 90 tablet 2  ° HYDROcodone-acetaminophen (NORCO) 10-325 MG tablet Take 1 tablet by mouth 3 (three) times daily.    ° lisinopril (ZESTRIL) 20 MG tablet TAKE 1 TABLET BY MOUTH IN THE MORNING AND AT BEDTIME 180 tablet 3  ° metFORMIN (GLUCOPHAGE-XR) 500 MG 24 hr tablet TAKE 1 TABLET BY MOUTH ONCE DAILY WITH EVENING MEAL 90 tablet 2  ° predniSONE (STERAPRED UNI-PAK 21 TAB) 10 MG (21) TBPK tablet Take 6ills first day , then 5 pills day 2 and then cut down one pill day until gone 21 tablet 0  °   rosuvastatin (CRESTOR) 10 MG tablet Take 10 mg by mouth daily.     Semaglutide (RYBELSUS) 7 MG TABS Take 7 mg by mouth daily. 30 tablet 6   hydrochlorothiazide (MICROZIDE) 12.5 MG capsule Take 1 capsule (12.5 mg total) by mouth daily. 90 capsule 3   No current facility-administered medications for this visit.    No Known Allergies   REVIEW OF SYSTEMS:   [X]  denotes positive finding, [ ]  denotes negative finding Cardiac  Comments:  Chest pain or chest pressure:    Shortness of breath upon exertion:    Short of breath when lying flat:    Irregular heart rhythm:        Vascular    Pain in calf, thigh, or hip brought on by ambulation:    Pain in feet at night that wakes you up from your sleep:     Blood clot in your veins:    Leg swelling:         Pulmonary    Oxygen at home:    Productive cough:     Wheezing:         Neurologic    Sudden weakness in arms or legs:     Sudden numbness in arms or legs:     Sudden onset of difficulty speaking or slurred speech:    Temporary loss of vision in one eye:     Problems with dizziness:         Gastrointestinal    Blood in stool:     Vomited blood:         Genitourinary    Burning when urinating:     Blood in urine:        Psychiatric    Major depression:         Hematologic    Bleeding problems:    Problems with blood clotting too easily:        Skin    Rashes or ulcers:        Constitutional    Fever or chills:      PHYSICAL  EXAMINATION:  Vitals:   05/17/21 1206  BP: (!) 199/79  Pulse: 65  Resp: 18  Temp: 98.3 F (36.8 C)  TempSrc: Temporal  SpO2: 97%  Weight: 144 lb 9.6 oz (65.6 kg)  Height: 5\' 10"  (1.778 m)    General:  WDWN in NAD; vital signs documented above Gait: Not observed HENT: WNL, normocephalic Pulmonary: normal non-labored breathing Cardiac: regular HR Abdomen: soft, NT, no masses Skin: without rashes Vascular Exam/Pulses: Absent pedal pulses; palpable right femoral pulse Extremities: Purple discoloration left fourth toe Musculoskeletal: no muscle wasting or atrophy  Neurologic: A&O X 3;  No focal weakness or paresthesias are detected Psychiatric:  The pt has Normal affect.   Non-Invasive Vascular Imaging:   Proximal left SFA stent with 436 cm/s velocity  Left      Lt Pressure (mmHg) Index Waveform            Comment   +---------+------------------+-----+-------------------+-------+   Brachial  186                                                    +---------+------------------+-----+-------------------+-------+   PTA       33                 0.18  dampened monophasic           +---------+------------------+-----+-------------------+-------+   DP        45                 0.24  dampened monophasic           +---------+------------------+-----+-------------------+-------+   ASSESSMENT/PLAN:: 66 y.o. male with critical limb ischemia with rest pain and tissue loss   -Subjectively patient has developed rest pain over the past 2-3 weeks and has a purple discoloration of his left fourth toe -Arterial duplex demonstrates a near occluded/occluded left SFA stent -Plan will be for aortogram with left lower extremity runoff and possible intervention with Dr. Trula Slade on 05/24/2021 -He will continue his aspirin and Plavix perioperatively -The above procedure including risks were discussed with the patient and he agrees to proceed -We also discussed the importance of smoking  cessation   Dagoberto Ligas, PA-C [facility] [phone]  Clinic MD:   Carlis Abbott

## 2021-05-19 DIAGNOSIS — Z01812 Encounter for preprocedural laboratory examination: Secondary | ICD-10-CM | POA: Diagnosis not present

## 2021-05-19 DIAGNOSIS — I442 Atrioventricular block, complete: Secondary | ICD-10-CM | POA: Diagnosis not present

## 2021-05-19 LAB — BASIC METABOLIC PANEL
BUN/Creatinine Ratio: 12 (ref 10–24)
BUN: 11 mg/dL (ref 8–27)
CO2: 25 mmol/L (ref 20–29)
Calcium: 9.4 mg/dL (ref 8.6–10.2)
Chloride: 98 mmol/L (ref 96–106)
Creatinine, Ser: 0.92 mg/dL (ref 0.76–1.27)
Glucose: 281 mg/dL — ABNORMAL HIGH (ref 70–99)
Potassium: 4.4 mmol/L (ref 3.5–5.2)
Sodium: 138 mmol/L (ref 134–144)
eGFR: 92 mL/min/{1.73_m2} (ref >59)

## 2021-05-19 LAB — CBC
Hematocrit: 43.3 % (ref 37.5–51.0)
Hemoglobin: 14.9 g/dL (ref 13.0–17.7)
MCH: 31.8 pg (ref 26.6–33.0)
MCHC: 34.4 g/dL (ref 31.5–35.7)
MCV: 92 fL (ref 79–97)
Platelets: 217 10*3/uL (ref 150–450)
RBC: 4.69 x10E6/uL (ref 4.14–5.80)
RDW: 13 % (ref 11.6–15.4)
WBC: 12.6 10*3/uL — ABNORMAL HIGH (ref 3.4–10.8)

## 2021-05-20 NOTE — Addendum Note (Signed)
Addended by: Elease Etienne A on: 05/20/2021 03:53 PM   Modules accepted: Level of Service

## 2021-05-20 NOTE — Progress Notes (Signed)
Remote pacemaker transmission.   

## 2021-05-23 ENCOUNTER — Telehealth: Payer: Self-pay | Admitting: Cardiology

## 2021-05-23 NOTE — Telephone Encounter (Signed)
Returned call to pt. Advised ok for him to start/restart Plavix after his vascular procedure tomorrow, aware that is will not be an issue for his PPM gen change with Dr. Elberta Fortis on 05/25/21.   Clarified pre procedure medication instructions with pt -- pt takes his Metformin and Glimepiride once daily after dinner, takes Semaglutide at breakfast.  Aware to hold Semagludtide and HCTZ morning of gen change.  Patient verbalized understanding and agreeable to plan.

## 2021-05-23 NOTE — Telephone Encounter (Signed)
Patient is scheduled for a procedure for Dr. Myra Gianotti tomorrow, 01/17. He states Dr. Myra Gianotti is wanting him to start on Plavix post-op. He would like to discuss this with Roanna Raider, RN if possible. Please return call to discuss.

## 2021-05-24 ENCOUNTER — Ambulatory Visit (HOSPITAL_COMMUNITY)
Admission: RE | Admit: 2021-05-24 | Discharge: 2021-05-24 | Disposition: A | Discharge status: Home / Self Care | Payer: PPO | Attending: Surgery | Admitting: Surgery

## 2021-05-24 ENCOUNTER — Encounter (HOSPITAL_COMMUNITY)
Admission: RE | Disposition: A | Discharge status: Home / Self Care | Payer: Self-pay | Source: Home / Self Care | Attending: Surgery

## 2021-05-24 ENCOUNTER — Other Ambulatory Visit: Payer: Self-pay

## 2021-05-24 DIAGNOSIS — E1151 Type 2 diabetes mellitus with diabetic peripheral angiopathy without gangrene: Secondary | ICD-10-CM | POA: Insufficient documentation

## 2021-05-24 DIAGNOSIS — F17210 Nicotine dependence, cigarettes, uncomplicated: Secondary | ICD-10-CM | POA: Diagnosis not present

## 2021-05-24 DIAGNOSIS — Z7902 Long term (current) use of antithrombotics/antiplatelets: Secondary | ICD-10-CM | POA: Diagnosis not present

## 2021-05-24 DIAGNOSIS — Z7982 Long term (current) use of aspirin: Secondary | ICD-10-CM | POA: Diagnosis not present

## 2021-05-24 DIAGNOSIS — T82856A Stenosis of peripheral vascular stent, initial encounter: Secondary | ICD-10-CM | POA: Diagnosis not present

## 2021-05-24 DIAGNOSIS — I70222 Atherosclerosis of native arteries of extremities with rest pain, left leg: Secondary | ICD-10-CM | POA: Diagnosis not present

## 2021-05-24 HISTORY — PX: ABDOMINAL AORTOGRAM W/LOWER EXTREMITY: CATH118223

## 2021-05-24 HISTORY — PX: PERIPHERAL VASCULAR INTERVENTION: CATH118257

## 2021-05-24 LAB — POCT I-STAT, CHEM 8
BUN: 13 mg/dL (ref 8–23)
Calcium, Ion: 1.2 mmol/L (ref 1.15–1.40)
Chloride: 101 mmol/L (ref 98–111)
Creatinine, Ser: 0.7 mg/dL (ref 0.61–1.24)
Glucose, Bld: 180 mg/dL — ABNORMAL HIGH (ref 70–99)
HCT: 45 % (ref 39.0–52.0)
Hemoglobin: 15.3 g/dL (ref 13.0–17.0)
Potassium: 4.1 mmol/L (ref 3.5–5.1)
Sodium: 138 mmol/L (ref 135–145)
TCO2: 26 mmol/L (ref 22–32)

## 2021-05-24 SURGERY — ABDOMINAL AORTOGRAM W/LOWER EXTREMITY
Anesthesia: LOCAL

## 2021-05-24 MED ORDER — FENTANYL CITRATE (PF) 100 MCG/2ML IJ SOLN
INTRAMUSCULAR | Status: AC
  Filled 2021-05-24: qty 2

## 2021-05-24 MED ORDER — HEPARIN SODIUM (PORCINE) 1000 UNIT/ML IJ SOLN
INTRAMUSCULAR | Status: DC | PRN
  Administered 2021-05-24: 6000 [IU] via INTRAVENOUS

## 2021-05-24 MED ORDER — HEPARIN (PORCINE) IN NACL 1000-0.9 UT/500ML-% IV SOLN
INTRAVENOUS | Status: AC
  Filled 2021-05-24: qty 500

## 2021-05-24 MED ORDER — LABETALOL HCL 5 MG/ML IV SOLN: 10.0000 mg | INTRAVENOUS | Status: DC | PRN

## 2021-05-24 MED ORDER — HYDRALAZINE HCL 20 MG/ML IJ SOLN: 5.0000 mg | INTRAMUSCULAR | Status: DC | PRN

## 2021-05-24 MED ORDER — MIDAZOLAM HCL 2 MG/2ML IJ SOLN
INTRAMUSCULAR | Status: AC
  Filled 2021-05-24: qty 2

## 2021-05-24 MED ORDER — HEPARIN (PORCINE) IN NACL 1000-0.9 UT/500ML-% IV SOLN
INTRAVENOUS | Status: DC | PRN
  Administered 2021-05-24 (×2): 500 mL

## 2021-05-24 MED ORDER — SODIUM CHLORIDE 0.9 % WEIGHT BASED INFUSION: 1.0000 mL/kg/h | INTRAVENOUS | Status: DC

## 2021-05-24 MED ORDER — LIDOCAINE HCL (PF) 1 % IJ SOLN
INTRAMUSCULAR | Status: DC | PRN
  Administered 2021-05-24: 15 mL via INTRADERMAL

## 2021-05-24 MED ORDER — MORPHINE SULFATE (PF) 2 MG/ML IV SOLN: 2.0000 mg | INTRAVENOUS | Status: DC | PRN

## 2021-05-24 MED ORDER — FENTANYL CITRATE (PF) 100 MCG/2ML IJ SOLN
INTRAMUSCULAR | Status: DC | PRN
  Administered 2021-05-24 (×2): 50 ug via INTRAVENOUS
  Administered 2021-05-24 (×2): 25 ug via INTRAVENOUS

## 2021-05-24 MED ORDER — IODIXANOL 320 MG/ML IV SOLN
INTRAVENOUS | Status: DC | PRN
  Administered 2021-05-24: 95 mL via INTRA_ARTERIAL

## 2021-05-24 MED ORDER — SODIUM CHLORIDE 0.9 % IV SOLN: INTRAVENOUS | Status: DC

## 2021-05-24 MED ORDER — ONDANSETRON HCL 4 MG/2ML IJ SOLN: 4.0000 mg | Freq: Four times a day (QID) | INTRAMUSCULAR | Status: DC | PRN

## 2021-05-24 MED ORDER — OXYCODONE HCL 5 MG PO TABS: 5.0000 mg | ORAL_TABLET | ORAL | Status: DC | PRN

## 2021-05-24 MED ORDER — SODIUM CHLORIDE 0.9% FLUSH: 3.0000 mL | INTRAVENOUS | Status: DC | PRN

## 2021-05-24 MED ORDER — ACETAMINOPHEN 325 MG PO TABS: 650.0000 mg | ORAL_TABLET | ORAL | Status: DC | PRN

## 2021-05-24 MED ORDER — SODIUM CHLORIDE 0.9 % IV SOLN: 250.0000 mL | INTRAVENOUS | Status: DC | PRN

## 2021-05-24 MED ORDER — HEPARIN SODIUM (PORCINE) 1000 UNIT/ML IJ SOLN
INTRAMUSCULAR | Status: AC
  Filled 2021-05-24: qty 10

## 2021-05-24 MED ORDER — MIDAZOLAM HCL 2 MG/2ML IJ SOLN
INTRAMUSCULAR | Status: DC | PRN
  Administered 2021-05-24 (×2): 1 mg via INTRAVENOUS
  Administered 2021-05-24 (×2): 2 mg via INTRAVENOUS

## 2021-05-24 MED ORDER — LIDOCAINE HCL (PF) 1 % IJ SOLN
INTRAMUSCULAR | Status: AC
  Filled 2021-05-24: qty 30

## 2021-05-24 MED ORDER — SODIUM CHLORIDE 0.9% FLUSH: 3.0000 mL | Freq: Two times a day (BID) | INTRAVENOUS | Status: DC

## 2021-05-24 SURGICAL SUPPLY — 15 items
CATH OMNI FLUSH 5F 65CM (CATHETERS) ×1 IMPLANT
DCB RANGER 4.0X100 135 (BALLOONS) IMPLANT
DEVICE VASC CLSR CELT ART 5 (Vascular Products) ×1 IMPLANT
KIT ENCORE 26 ADVANTAGE (KITS) ×1 IMPLANT
KIT MICROPUNCTURE NIT STIFF (SHEATH) ×1 IMPLANT
KIT PV (KITS) ×4 IMPLANT
RANGER DCB 4.0X100 135 (BALLOONS) ×6
SHEATH DESTINATION MP 5FR 45CM (SHEATH) ×1 IMPLANT
SHEATH PINNACLE 5F 10CM (SHEATH) ×1 IMPLANT
SHEATH PROBE COVER 6X72 (BAG) ×1 IMPLANT
SYR MEDRAD MARK V 150ML (SYRINGE) ×1 IMPLANT
TRANSDUCER W/STOPCOCK (MISCELLANEOUS) ×4 IMPLANT
TRAY PV CATH (CUSTOM PROCEDURE TRAY) ×4 IMPLANT
WIRE BENTSON .035X145CM (WIRE) ×1 IMPLANT
WIRE G V18X300CM (WIRE) ×1 IMPLANT

## 2021-05-24 NOTE — Discharge Instructions (Signed)
Femoral Site Care This sheet gives you information about how to care for yourself after your procedure. Your health care provider may also give you more specific instructions. If you have problems or questions, contact your health care provider. What can I expect after the procedure? After the procedure, it is common to have: Bruising that usually fades within 1-2 weeks. Tenderness at the site. Follow these instructions at home: Wound care May remove bandage after 24 hours. Do not take baths, swim, or use a hot tub for 5 days. You may shower 24-48 hours after the procedure. Gently wash the site with plain soap and water. Pat the area dry with a clean towel. Do not rub the site. This may cause bleeding. Do not apply powder or lotion to the site. Keep the site clean and dry. Check your femoral site every day for signs of infection. Check for: Redness, swelling, or pain. Fluid or blood. Warmth. Pus or a bad smell. Activity For the first 2-3 days after your procedure, or as long as directed: Avoid climbing stairs as much as possible. Do not squat. Do not lift, push or pull anything that is heavier than 10 lb for 5 days. Rest as directed. Avoid sitting for a long time without moving. Get up to take short walks every 1-2 hours. Do not drive for 24 hours. General instructions Take over-the-counter and prescription medicines only as told by your health care provider. Keep all follow-up visits as told by your health care provider. This is important. DRINK PLENTY OF FLUIDS FOR THE NEXT 2-3 DAYS. Contact a health care provider if you have: A fever or chills. You have redness, swelling, or pain around your insertion site. Get help right away if: The catheter insertion area swells very fast. You pass out. You suddenly start to sweat or your skin gets clammy. The catheter insertion area is bleeding, and the bleeding does not stop when you hold steady pressure on the area. The area near or  just beyond the catheter insertion site becomes pale, cool, tingly, or numb. These symptoms may represent a serious problem that is an emergency. Do not wait to see if the symptoms will go away. Get medical help right away. Call your local emergency services (911 in the [place] Do not drive yourself to the hospital. Summary After the procedure, it is common to have bruising that usually fades within 1-2 weeks. Check your femoral site every day for signs of infection. Do not lift, push or pull anything that is heavier than 10 lb for 5 days.  This information is not intended to replace advice given to you by your health care provider. Make sure you discuss any questions you have with your health care provider. Document Revised: 05/07/2017 Document Reviewed: 05/07/2017 Elsevier Patient Education  2020 [employer]  

## 2021-05-24 NOTE — Progress Notes (Signed)
Report given to Molly,RN who will assume care at this time 

## 2021-05-24 NOTE — Op Note (Signed)
Patient name: Andrew Wiggins MRN: [id number] DOB: [birth year] Sex: male  05/24/2021 Pre-operative Diagnosis: Left foot rest pain Post-operative diagnosis:  Same Surgeon:  [name] Procedure Performed:  1.  Ultrasound-guided access, right femoral artery  2.  Abdominal aortogram  3.  Left leg runoff  4.  Third order catheterization  5.  Drug-coated balloon angioplasty, left superficial femoral artery (4 mm Ranger balloon)  6.  Conscious sedation, 52 minutes  7.  Closure device, Celt   Indications: This is a 66 year old gentleman who previously underwent intervention to the left superficial femoral artery via stenting for 90% lesion.  He has had persistent pain and ultrasound findings showed recurrent stenosis.  He is back for further evaluation  Procedure:  The patient was identified in the holding area and taken to room 8.  The patient was then placed supine on the table and prepped and draped in the usual sterile fashion.  A time out was called.  Conscious sedation was administered with the use of IV fentanyl and Versed under continuous physician and nurse monitoring.  Heart rate, blood pressure, and oxygen saturation were continuously monitored.  Total sedation time was 67 minutes.  Ultrasound was used to evaluate the right common femoral artery.  It was patent .  A digital ultrasound image was acquired.  A micropuncture needle was used to access the right common femoral artery under ultrasound guidance.  An 018 wire was advanced without resistance and a micropuncture sheath was placed.  The 018 wire was removed and a benson wire was placed.  The micropuncture sheath was exchanged for a 5 french sheath.  An omniflush catheter was advanced over the wire to the level of L-1.  An abdominal angiogram was obtained.  Next, using the omniflush catheter and a benson wire, the aortic bifurcation was crossed and the catheter was placed into theleft external iliac artery and left runoff was obtained.     Findings:   Aortogram:  No significant renal artery stenosis was identified.  The infrarenal abdominal aorta is widely patent without stenosis.  Bilateral common and external iliac arteries are small in caliber but patent without significant stenosis   Right Lower Extremity: Not evaluated as the patient could not sit still  Left Lower Extremity: The left common femoral profundofemoral artery are small in caliber though patent without stenosis.  The superficial femoral artery is diffusely diseased.  With moderate stenosis down to the stent which was in the adductor canal.  This showed diffuse in-stent stenosis, greater than 90% throughout.  The popliteal artery is patent throughout its course.  There is three-vessel runoff to the ankle.  The dominant vessel is anterior tibial.  There is diffuse disease out onto the foot  Intervention: After above images were acquired the decision made to proceed with intervention.  A 5 French 45 cm sheath was advanced into the left superficial femoral artery.  The patient was fully heparinized.  Next using a V-18 wire, I crossed the lesions.  I initially treated the in-stent stenosis with a 4 x 100 Ranger drug-coated balloon.  Follow-up imaging revealed resolution of the stenosis within the stent however there was a native artery nonflow limiting dissection.  I also felt there was a stenosis above this.  I therefore reinserted a new  4 x 100 Ranger balloon and performed balloon angioplasty of the superficial femoral artery.  Completion imaging showed good result.  There is disease proximally but does not appear to be flow-limiting, and the patient  was not tolerating laying flat.  I felt the best thing to do a stop procedure at this time.  I closed the groin with a Celt device.  There were no immediate complications.  Impression:  #1  In-stent stenosis on the left superficial femoral artery stent treated using a 4 mm Ranger balloon  #2  Luminal narrowing, greater than  40-50% within the proximal superficial femoral artery treated using a 4 mm Ranger drug-coated balloon   V. [name], M.D., [facility] [place] Office: [phone] Pager:  [phone]

## 2021-05-24 NOTE — Interval H&P Note (Signed)
History and Physical Interval Note:  05/24/2021 11:46 AM  Andrew Wiggins  has presented today for surgery, with the diagnosis of ischemia.  The various methods of treatment have been discussed with the patient and family. After consideration of risks, benefits and other options for treatment, the patient has consented to  Procedure(s): ABDOMINAL AORTOGRAM W/LOWER EXTREMITY (N/A) as a surgical intervention.  The patient's history has been reviewed, patient examined, no change in status, stable for surgery.  I have reviewed the patient's chart and labs.  Questions were answered to the patient's satisfaction.     [name]

## 2021-05-25 ENCOUNTER — Other Ambulatory Visit: Payer: Self-pay

## 2021-05-25 ENCOUNTER — Encounter (HOSPITAL_COMMUNITY)
Admission: RE | Disposition: A | Discharge status: Home / Self Care | Payer: Self-pay | Source: Home / Self Care | Attending: Cardiology

## 2021-05-25 ENCOUNTER — Encounter (HOSPITAL_COMMUNITY): Payer: Self-pay | Admitting: Surgery

## 2021-05-25 ENCOUNTER — Ambulatory Visit: Payer: PPO | Admitting: Legal Medicine

## 2021-05-25 ENCOUNTER — Ambulatory Visit (HOSPITAL_COMMUNITY)
Admission: RE | Admit: 2021-05-25 | Discharge: 2021-05-25 | Disposition: A | Discharge status: Home / Self Care | Payer: PPO | Attending: Cardiology | Admitting: Cardiology

## 2021-05-25 DIAGNOSIS — R001 Bradycardia, unspecified: Secondary | ICD-10-CM | POA: Insufficient documentation

## 2021-05-25 DIAGNOSIS — E1140 Type 2 diabetes mellitus with diabetic neuropathy, unspecified: Secondary | ICD-10-CM | POA: Diagnosis not present

## 2021-05-25 DIAGNOSIS — Z950 Presence of cardiac pacemaker: Secondary | ICD-10-CM | POA: Diagnosis present

## 2021-05-25 DIAGNOSIS — I442 Atrioventricular block, complete: Secondary | ICD-10-CM | POA: Insufficient documentation

## 2021-05-25 DIAGNOSIS — I10 Essential (primary) hypertension: Secondary | ICD-10-CM | POA: Diagnosis not present

## 2021-05-25 DIAGNOSIS — E1151 Type 2 diabetes mellitus with diabetic peripheral angiopathy without gangrene: Secondary | ICD-10-CM | POA: Diagnosis not present

## 2021-05-25 DIAGNOSIS — J449 Chronic obstructive pulmonary disease, unspecified: Secondary | ICD-10-CM | POA: Diagnosis not present

## 2021-05-25 DIAGNOSIS — Z45010 Encounter for checking and testing of cardiac pacemaker pulse generator [battery]: Secondary | ICD-10-CM | POA: Insufficient documentation

## 2021-05-25 HISTORY — PX: PPM GENERATOR CHANGEOUT: EP1233

## 2021-05-25 LAB — GLUCOSE, CAPILLARY: Glucose-Capillary: 186 mg/dL — ABNORMAL HIGH (ref 70–99)

## 2021-05-25 SURGERY — PPM GENERATOR CHANGEOUT

## 2021-05-25 MED ORDER — ONDANSETRON HCL 4 MG/2ML IJ SOLN: 4.0000 mg | Freq: Four times a day (QID) | INTRAMUSCULAR | Status: DC | PRN

## 2021-05-25 MED ORDER — SODIUM CHLORIDE 0.9 % IV SOLN: INTRAVENOUS | Status: DC

## 2021-05-25 MED ORDER — CHLORHEXIDINE GLUCONATE 4 % EX LIQD
60.0000 mL | Freq: Once | CUTANEOUS | Status: DC
  Filled 2021-05-25: qty 60

## 2021-05-25 MED ORDER — LIDOCAINE HCL (PF) 1 % IJ SOLN
INTRAMUSCULAR | Status: AC
  Filled 2021-05-25: qty 60

## 2021-05-25 MED ORDER — FENTANYL CITRATE (PF) 100 MCG/2ML IJ SOLN
INTRAMUSCULAR | Status: AC
  Filled 2021-05-25: qty 2

## 2021-05-25 MED ORDER — SODIUM CHLORIDE 0.9 % IV SOLN
INTRAVENOUS | Status: AC
  Filled 2021-05-25: qty 2

## 2021-05-25 MED ORDER — ACETAMINOPHEN 325 MG PO TABS: 325.0000 mg | ORAL_TABLET | ORAL | Status: DC | PRN

## 2021-05-25 MED ORDER — MIDAZOLAM HCL 5 MG/5ML IJ SOLN
INTRAMUSCULAR | Status: AC
  Filled 2021-05-25: qty 5

## 2021-05-25 MED ORDER — SODIUM CHLORIDE 0.9 % IV SOLN
80.0000 mg | INTRAVENOUS | Status: AC
  Administered 2021-05-25: 80 mg

## 2021-05-25 MED ORDER — CEFAZOLIN SODIUM-DEXTROSE 2-4 GM/100ML-% IV SOLN
INTRAVENOUS | Status: AC
  Filled 2021-05-25: qty 100

## 2021-05-25 MED ORDER — MIDAZOLAM HCL 5 MG/5ML IJ SOLN
INTRAMUSCULAR | Status: DC | PRN
  Administered 2021-05-25: 1 mg via INTRAVENOUS

## 2021-05-25 MED ORDER — LIDOCAINE HCL (PF) 1 % IJ SOLN
INTRAMUSCULAR | Status: DC | PRN
  Administered 2021-05-25: 60 mL

## 2021-05-25 MED ORDER — FENTANYL CITRATE (PF) 100 MCG/2ML IJ SOLN
INTRAMUSCULAR | Status: DC | PRN
  Administered 2021-05-25: 25 ug via INTRAVENOUS

## 2021-05-25 MED ORDER — CEFAZOLIN SODIUM-DEXTROSE 2-4 GM/100ML-% IV SOLN
2.0000 g | INTRAVENOUS | Status: AC
  Administered 2021-05-25: 2 g via INTRAVENOUS

## 2021-05-25 SURGICAL SUPPLY — 5 items
CABLE SURGICAL S-101-97-12 (CABLE) ×3 IMPLANT
IPG PACE AZUR XT DR MRI W1DR01 (Pacemaker) IMPLANT
PACE AZURE XT DR MRI W1DR01 (Pacemaker) ×2 IMPLANT
PAD DEFIB RADIO PHYSIO CONN (PAD) ×3 IMPLANT
TRAY PACEMAKER INSERTION (PACKS) ×3 IMPLANT

## 2021-05-25 NOTE — H&P (Signed)
Electrophysiology Office Note   Date:  05/25/2021   ID:  Andrew Wiggins, Andrew Wiggins 1955-06-10, MRN [id number]  PCP:  Abigail Miyamoto, MD  Cardiologist:  [name] Primary Electrophysiologist:  Andrew Sauls Jorja Loa, MD    Chief Complaint: pacemaker   History of Present Illness: Andrew Wiggins is a 66 y.o. male who is being seen today for the evaluation of pacemaker at the request of No ref. provider found. Presenting today for electrophysiology evaluation.  He has a history significant for complete heart block status post [employer] dual-chamber pacemaker, hypertension, COPD, hyperlipidemia.   On 12/14/2020, he had an angiogram due to a left toe ulcer.  He had a stent placed in his left superficial femoral artery.  Today, denies symptoms of palpitations, chest pain, shortness of breath, orthopnea, PND, lower extremity edema, claudication, dizziness, presyncope, syncope, bleeding, or neurologic sequela. The patient is tolerating medications without difficulties. Plan generator change today.   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 ~  Jan11:  Last seen by Cards & pacer reprogrammed...     Cardiac pacemaker in situ    Carotid artery disease (HCC) 07/27/2010   R/O PERIPHERAL VASCULAR DISEASE (ICD-443.9) - on ASA 81mg /d... exam 11/09 shows gr 1-2 sys murmur LSB, but also has prominent bruit to base of right side of his neck w/ right > left Carotid Bruit... ~  CDopplers 11/09 showed severe plaque right ICA, & min plaque on left... no signif ICA stenoses per report. ~  f/u CDopplers 12/10 showed mild irreg plaque bilat in bulbs & intimal thickening in righ   COPD (chronic obstructive pulmonary disease) with chronic bronchitis (HCC)  03/26/2008   CHRONIC OBSTRUCTIVE PULMONARY DISEASE (ICD-496) - hx recurrent bronchitic episodes in the past... smokes 1/2 - 1ppd x yrs... ~  PFT's 11/08 showed FVC= 3.85 (79%), FEV1= 2.19 (55%), %1sec= 57, mid-flows= 32% predicted:  all c/w mod airflow obstruction...  ~  CXR 12/10 showed left subclav pacer, mild hyperinflation, NAD...     Dermatitis, contact    due to poison ivy   Diabetes mellitus with neuropathy (HCC) 12/20/2016   Dyslipidemia 03/06/2012   Essential hypertension 04/10/2007    HYPERTENSION (ICD-401.9) - prev on Micardis 80mg /d then switched to Losartan 100mg /d, but he lost his job in 2011& couldn't afford Rx;  He called 14/07/2006 2/12 w/ this news & we phoned in Lisinopril 20mg /d... ~  2DEcho 11/09 showed sl incr LVwall thickness, norm LVF w/ EF= 55-60%, no regional wall motion abn, mild DD, mild MR... ~  Myoview 12/09 showed LBBB, no perfusion defects, abn septal motion & EF   GERD 03/26/2008    GERD (ICD-530.81) - prev mild GERD symptoms that improved after cholecystectomy on 2000... uses OTC PPI as needed... ~  he is due for routine screening colonoscopy...       GERD (gastroesophageal reflux disease)    Headache(784.0)    Hypertension    Mixed hyperlipidemia 06/19/2019   Peripheral vascular disease (HCC)    Sciatica 11/18/2019   Tobacco use disorder    Past Surgical History:  Procedure Laterality Date   ABDOMINAL AORTOGRAM W/LOWER EXTREMITY N/A 12/14/2020   Procedure: ABDOMINAL AORTOGRAM W/LOWER EXTREMITY;  Surgeon: 2001, MD;  Location: [facility];  Service: Cardiovascular;  Laterality: N/A;   ABDOMINAL AORTOGRAM W/LOWER EXTREMITY N/A 05/24/2021   Procedure: ABDOMINAL AORTOGRAM W/LOWER EXTREMITY;  Surgeon: Nada Libman, MD;  Location: [facility];  Service: Cardiovascular;  Laterality: N/A;   laproscopic cholecystectomy  2000   by Dr. Odie Wiggins   PACEMAKER INSERTION  03/2008   by Dr. Ladona Wiggins   PERIPHERAL VASCULAR INTERVENTION Left 12/14/2020    Procedure: PERIPHERAL VASCULAR INTERVENTION;  Surgeon: Nada Libman, MD;  Location: [facility];  Service: Cardiovascular;  Laterality: Left;  SFA   PERIPHERAL VASCULAR INTERVENTION Left 05/24/2021   Procedure: PERIPHERAL VASCULAR INTERVENTION;  Surgeon: Nada Libman, MD;  Location: [facility];  Service: Cardiovascular;  Laterality: Left;     Current Facility-Administered Medications  Medication Dose Route Frequency Provider Last Rate Last Admin   0.9 %  sodium chloride infusion   Intravenous Continuous Andrew Osuch Daphine Deutscher, MD 50 mL/hr at 05/25/21 1327 New Bag at 05/25/21 1327   ceFAZolin (ANCEF) IVPB 2g/100 mL premix  2 g Intravenous On Call Andrew Capers Daphine Deutscher, MD       chlorhexidine (HIBICLENS) 4 % liquid 4 application  60 mL Topical Once Andrew Griswold Daphine Deutscher, MD       chlorhexidine (HIBICLENS) 4 % liquid 4 application  60 mL Topical Once Andrew Mutch Daphine Deutscher, MD       gentamicin (GARAMYCIN) 80 mg in sodium chloride 0.9 % 500 mL irrigation  80 mg Irrigation On Call Andrew Lemming, MD        Allergies:   Patient has no known allergies.   Social History:  The patient  reports that he has been smoking cigarettes. He has a 25.00 pack-year smoking history. He has never used smokeless tobacco. He reports that he does not currently use alcohol. He reports that he does not use drugs.   Family History:  The patient's family history includes Diabetes in his brother.   ROS:  Please see the history of present illness.   Otherwise, review of systems is positive for none.   All other systems are reviewed and negative.   PHYSICAL EXAM: VS:  BP (!) 189/81    Pulse 65    Temp 98.3 F (36.8 C) (Oral)    Resp 16    Ht 5\' 10"  (1.778 m)    Wt 67.1 kg    SpO2 99%    BMI 21.24 kg/m  , BMI Body mass index is 21.24 kg/m. GEN: Well nourished, well developed, in no acute distress  HEENT: normal  Neck: no JVD, carotid bruits, or masses Cardiac: RRR; no murmurs, rubs, or gallops,no  edema  Respiratory:  clear to auscultation bilaterally, normal work of breathing GI: soft, nontender, nondistended, + BS MS: no deformity or atrophy  Skin: warm and dry, device site well healed Neuro:  Strength and sensation are intact Psych: euthymic mood, full affect    Recent Labs: 01/12/2021: ALT 17 05/19/2021: Platelets 217 05/24/2021: BUN 13; Creatinine, Ser 0.70; Hemoglobin 15.3; Potassium 4.1; Sodium 138    Lipid Panel     Component Value Date/Time   CHOL 88 (L) 01/12/2021 1100   TRIG 175 (H) 01/12/2021 1100   HDL 27 (L) 01/12/2021 1100   CHOLHDL 3.3 01/12/2021 1100   CHOLHDL 5 12/20/2017 1320   VLDL 44.0 (H) 12/20/2017 1320   LDLCALC 32 01/12/2021 1100   LDLDIRECT 113.0 12/20/2017 1320     Wt Readings from Last 3 Encounters:  05/25/21 67.1 kg  05/24/21 67.1 kg  05/17/21 65.6 kg      Other studies Reviewed: Additional studies/ records that were reviewed today include: epic notes   ASSESSMENT AND PLAN:  1.  Complete heart block: Legacy L Gambone has presented today for surgery, with the diagnosis of complete AV block.  The various methods of treatment have been discussed with the patient and family. After consideration of risks, benefits and other options for treatment, the patient has consented to  Procedure(s): Pacemaker generator change as a surgical intervention .  Risks include but not limited to bleeding, infection, pneumothorax, perforation, tamponade, vascular damage, renal failure, MI, stroke, death, and lead dislodgement . The patient's history has been reviewed, patient examined, no change in status, stable for surgery.  I have reviewed the patient's chart and labs.  Questions were answered to the patient's satisfaction.    Dalaya Suppa Elberta Fortisamnitz, MD 05/25/2021 2:01 PM

## 2021-05-25 NOTE — Discharge Instructions (Signed)

## 2021-05-26 ENCOUNTER — Encounter (HOSPITAL_COMMUNITY): Payer: Self-pay | Admitting: Cardiology

## 2021-05-26 DIAGNOSIS — M549 Dorsalgia, unspecified: Secondary | ICD-10-CM | POA: Diagnosis not present

## 2021-05-26 DIAGNOSIS — M5137 Other intervertebral disc degeneration, lumbosacral region: Secondary | ICD-10-CM | POA: Diagnosis not present

## 2021-05-26 DIAGNOSIS — G894 Chronic pain syndrome: Secondary | ICD-10-CM | POA: Diagnosis not present

## 2021-05-26 DIAGNOSIS — M48061 Spinal stenosis, lumbar region without neurogenic claudication: Secondary | ICD-10-CM | POA: Diagnosis not present

## 2021-05-26 DIAGNOSIS — M5136 Other intervertebral disc degeneration, lumbar region: Secondary | ICD-10-CM | POA: Diagnosis not present

## 2021-05-26 DIAGNOSIS — M47816 Spondylosis without myelopathy or radiculopathy, lumbar region: Secondary | ICD-10-CM | POA: Diagnosis not present

## 2021-05-26 DIAGNOSIS — Z79891 Long term (current) use of opiate analgesic: Secondary | ICD-10-CM | POA: Diagnosis not present

## 2021-05-26 DIAGNOSIS — Z1389 Encounter for screening for other disorder: Secondary | ICD-10-CM | POA: Diagnosis not present

## 2021-05-27 ENCOUNTER — Other Ambulatory Visit: Payer: Self-pay | Admitting: Legal Medicine

## 2021-05-27 DIAGNOSIS — E0840 Diabetes mellitus due to underlying condition with diabetic neuropathy, unspecified: Secondary | ICD-10-CM

## 2021-06-06 ENCOUNTER — Other Ambulatory Visit: Payer: Self-pay | Admitting: Legal Medicine

## 2021-06-06 DIAGNOSIS — E0840 Diabetes mellitus due to underlying condition with diabetic neuropathy, unspecified: Secondary | ICD-10-CM

## 2021-06-09 ENCOUNTER — Other Ambulatory Visit: Payer: Self-pay

## 2021-06-09 ENCOUNTER — Ambulatory Visit (INDEPENDENT_AMBULATORY_CARE_PROVIDER_SITE_OTHER): Payer: PPO

## 2021-06-09 DIAGNOSIS — I4430 Unspecified atrioventricular block: Secondary | ICD-10-CM | POA: Diagnosis not present

## 2021-06-09 LAB — CUP PACEART INCLINIC DEVICE CHECK
Battery Remaining Longevity: 159 mo
Battery Voltage: 3.22 V
Brady Statistic AP VP Percent: 0.83 %
Brady Statistic AP VS Percent: 0 %
Brady Statistic AS VP Percent: 99.16 %
Brady Statistic AS VS Percent: 0.01 %
Brady Statistic RA Percent Paced: 0.83 %
Brady Statistic RV Percent Paced: 99.99 %
Date Time Interrogation Session: 20230202140614
Implantable Lead Implant Date: 20091120
Implantable Lead Implant Date: 20091120
Implantable Lead Location: 753859
Implantable Lead Location: 753860
Implantable Lead Model: 5076
Implantable Lead Model: 5076
Implantable Pulse Generator Implant Date: 20230118
Lead Channel Impedance Value: 399 Ohm
Lead Channel Impedance Value: 513 Ohm
Lead Channel Impedance Value: 817 Ohm
Lead Channel Impedance Value: 874 Ohm
Lead Channel Pacing Threshold Amplitude: 0.75 V
Lead Channel Pacing Threshold Amplitude: 0.75 V
Lead Channel Pacing Threshold Pulse Width: 0.4 ms
Lead Channel Pacing Threshold Pulse Width: 0.4 ms
Lead Channel Sensing Intrinsic Amplitude: 6.75 mV
Lead Channel Sensing Intrinsic Amplitude: 8.375 mV
Lead Channel Setting Pacing Amplitude: 1.5 V
Lead Channel Setting Pacing Amplitude: 2 V
Lead Channel Setting Pacing Pulse Width: 0.4 ms
Lead Channel Setting Sensing Sensitivity: 0.9 mV

## 2021-06-09 NOTE — Patient Instructions (Addendum)
° °  After Your Pacemaker   Monitor your pacemaker site for redness, swelling, and drainage. Call the device clinic at [phone] if you experience these symptoms or fever/chills.  Your incision was closed with Steri-strips or staples:  You may shower 7 days after your procedure and wash your incision with soap and water. Avoid lotions, ointments, or perfumes over your incision until it is well-healed.  You may use a hot tub or a pool after your wound check appointment if the incision is completely closed.   You may drive, unless driving has been restricted by your healthcare providers.  Your Pacemaker is MRI compatible.  Remote monitoring is used to monitor your pacemaker from home. This monitoring is scheduled every 91 days by our office. It allows [place] to keep an eye on the functioning of your device to ensure it is working properly. You will routinely see your Electrophysiologist annually (more often if necessary).   Please try to send a remote transmission when you get home. If you have any difficulties please call [employer] at [phone]

## 2021-06-09 NOTE — Progress Notes (Signed)
Wound check appointment s/p PPM gen change 05/25/21. Steri-strips removed. Wound without redness or edema. Incision edges approximated, wound well healed. Normal device function. Thresholds, sensing, and impedances consistent with implant measurements. Device programmed for chronic lead settings. Histogram distribution appropriate for patient and level of activity. No mode switches or high ventricular rates noted. Patient's resting rate could be related to recently smoking and large amt of caffeine intake. Counseling provided. Patient educated about wound care, arm mobility, lifting restrictions. Patient is enrolled in remote monitoring however manual transmission has not been received since gen change. Patient will attempt to send transmission upon return home. Next remote scheduled 08/26/21. 91 day follow up with Dr. Curt Bears 08/29/21.

## 2021-06-10 ENCOUNTER — Telehealth: Payer: Self-pay

## 2021-06-10 NOTE — Telephone Encounter (Signed)
°  Andrew Wiggins spoke with the patient and tried to help him send a transmission with his home remote monitor. She conference the patient with [employer] to get additional help. His monitor serial number was wrong so the [employer] rep updated the serial number. [employer] will send him a new monitor.

## 2021-06-10 NOTE — Telephone Encounter (Signed)
Assess for manual transmission as requested during yesterdays wound check appointment. Transmission has not been received. Unsuccessful telephone call to patient. Detailed message left per DPR instructions requesting patient call [employer] at provided number for troubleshooting assistance. Request patient call device clinic at [phone] once he has spoken with [employer].

## 2021-06-13 ENCOUNTER — Other Ambulatory Visit: Payer: Self-pay

## 2021-06-13 ENCOUNTER — Ambulatory Visit: Payer: PPO | Admitting: Podiatry

## 2021-06-13 ENCOUNTER — Ambulatory Visit (INDEPENDENT_AMBULATORY_CARE_PROVIDER_SITE_OTHER): Payer: PPO

## 2021-06-13 ENCOUNTER — Other Ambulatory Visit: Payer: Self-pay | Admitting: Podiatry

## 2021-06-13 DIAGNOSIS — I96 Gangrene, not elsewhere classified: Secondary | ICD-10-CM | POA: Diagnosis not present

## 2021-06-13 DIAGNOSIS — I739 Peripheral vascular disease, unspecified: Secondary | ICD-10-CM

## 2021-06-13 DIAGNOSIS — G8929 Other chronic pain: Secondary | ICD-10-CM

## 2021-06-13 DIAGNOSIS — I998 Other disorder of circulatory system: Secondary | ICD-10-CM | POA: Diagnosis not present

## 2021-06-13 DIAGNOSIS — M79675 Pain in left toe(s): Secondary | ICD-10-CM

## 2021-06-13 NOTE — Progress Notes (Signed)
°  Subjective:  Patient ID: Andrew Wiggins, male    DOB: [birth year],  MRN: [id number]  Chief Complaint  Patient presents with   Foot Pain    4th toe had a red spot on it and then went to the top of the foot    66 y.o. male presents with the above complaint. History confirmed with patient. States that the 4th toe has been an issue for a while but they were told that at this point it is a problem with his foot. Had ulceration since at least 2 weeks prior to his appt on 12/10/20 with Dr. Donzetta Matters. Was experiencing ischemic pain to the right leg - underwent intervention 12/14/20 and 05/24/20. Next appt with Vascular 06/27/21. Objective:  Physical Exam: Left Foot: Foot warm to touch. Unable to palpate pedal pulses.  Dry gangrene distal tip of left 4th toe with signs of demarcation. No warmth, erythema, SOI. Surrounding skin moist but not macerated. Right Foot: Foot warm to touch palpable DP pulse right   No images are attached to the encounter.  Angiogram 1/17 - Dr. Trula Slade: Left Lower Extremity: The left common femoral profundofemoral artery are small in caliber though patent without stenosis.  The superficial femoral artery is diffusely diseased.  With moderate stenosis down to the stent which was in the adductor canal.  This showed diffuse in-stent stenosis, greater than 90% throughout.  The popliteal artery is patent throughout its course.  There is three-vessel runoff to the ankle.  The dominant vessel is anterior tibial.  There is diffuse disease out onto the foot  XR taken and reviewed: no definite osseous erosions, no SQ air. Assessment:   1. Gangrene of toe of left foot ([place])   2. PAD (peripheral artery disease) (HCC)   3. Vascular calcification    Plan:  Patient was evaluated and treated and all questions answered.  Gangrene toe left foot -Reviewed previous vascular studies. Discussed these with patient. Intrinsic pedal disease likely means that his foot is optimized for now. Will have to await  demarcation of the 4th toe. -XR reviewed with patient -Dressing applied consisting of betadine sterile gauze and kerlix. Educated on changing this daily. Dispense betadine. -Offload ulcer with surgical shoe -Surgical shoe dispensed. Medically necessary to prevent pressure at the toe that could worsen gangrene. -Defer nail care today given time constraints. Will do next visit. Advised not to do this himself.  Return in about 3 weeks (around 07/04/2021).

## 2021-06-15 NOTE — Telephone Encounter (Signed)
Pt received his new monitor. Transmission received 06/15/2021.

## 2021-06-16 NOTE — Progress Notes (Signed)
Subjective:  Patient ID: Andrew Wiggins, male    DOB: 04/09/1956  Age: 66 y.o. MRN: [id number]  Chief Complaint  Patient presents with   foot pain     Left     HPI: chronic visit  Left foot pain  Diabetes: Patient is taking Rybelsus 7 mg daily, glimepiride 4 mg daily, Metformin XR 500 daily. 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  Now seeing podiatry. He saw vascular surgery. A1c has been rising.  He is not on diet.  Hypertension: He takes lisinopril 20 mg daily, Hydrochlorothiazide 12.5 mg daily, aspirin 81 mg daily. Patient presents for follow up of hypertension.  Patient tolerating lisinopril, HCTZ well with side effects.  Patient was diagnosed with hypertension 2010 so has been treated for hypertension for 12 years.Patient is working on maintaining diet and exercise regimen and follows up as directed. Complication include none.   Hyperlipidemia: Currently taking Atorvastatin 20 mg daily. Patient presents with hyperlipidemia.  Compliance with treatment has been good; patient takes medicines as directed, maintains low cholesterol diet, follows up as directed, and maintains exercise regimen.  Patient is using atorvastatin without problems.   PVC: Taking plavix 75 mg daily.problems with SFA, not surgical. He is cutting down smoking, now 1 pk/day. He had stent last year that clotted and recently had PCI.   Current Outpatient Medications on File Prior to Visit  Medication Sig Dispense Refill   aspirin EC 81 MG tablet Take 1 tablet (81 mg total) by mouth daily. Swallow whole. 150 tablet 2   atorvastatin (LIPITOR) 20 MG tablet Take 20 mg by mouth daily. (Patient not taking: Reported on 06/17/2021)     clopidogrel (PLAVIX) 75 MG tablet Take 75 mg by mouth daily.     doxycycline (VIBRA-TABS) 100 MG tablet Take 1 tablet (100 mg total) by mouth 2 (two) times daily. (Patient not taking: Reported on 05/23/2021) 20 tablet 0   gabapentin (NEURONTIN) 300 MG capsule TAKE 1 CAPSULE BY MOUTH THREE TIMES  DAILY 90 capsule 3   glimepiride (AMARYL) 4 MG tablet Take 1 tablet by mouth once daily 90 tablet 2   hydrochlorothiazide (MICROZIDE) 12.5 MG capsule Take 1 capsule (12.5 mg total) by mouth daily. 90 capsule 3   HYDROcodone-acetaminophen (NORCO) 10-325 MG tablet Take 1 tablet by mouth 3 (three) times daily.     lisinopril (ZESTRIL) 20 MG tablet TAKE 1 TABLET BY MOUTH IN THE MORNING AND AT BEDTIME 180 tablet 3   metFORMIN (GLUCOPHAGE-XR) 500 MG 24 hr tablet TAKE 1 TABLET BY MOUTH ONCE DAILY WITH EVENING MEAL 90 tablet 2   Semaglutide (RYBELSUS) 7 MG TABS Take 7 mg by mouth daily. 30 tablet 6   No current facility-administered medications on file prior to visit.   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 ~  Jan11:  Last seen by Cards & pacer reprogrammed...     Cardiac pacemaker in situ    Carotid artery disease ([place]) 07/27/2010   R/O PERIPHERAL VASCULAR DISEASE (ICD-443.9) - on ASA 81mg /d... exam 11/09 shows gr 1-2 sys murmur LSB, but also has prominent bruit to base of right side of his neck w/ right > left Carotid Bruit... ~  CDopplers 11/09 showed severe  plaque right ICA, & min plaque on left... no signif ICA stenoses per report. 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 smokes 1/2 - 1ppd x yrs... ~  PFT's 11/08 showed FVC= 3.85 (79%), FEV1= 2.19 (55%), %1sec= 57, mid-flows= 32% predicted:  all c/w mod airflow obstruction...  ~  CXR 12/10 showed left subclav pacer, mild hyperinflation, NAD...      Dermatitis, contact    due to poison ivy   Diabetes mellitus with neuropathy ([place]) 12/20/2016   Dyslipidemia 03/06/2012   Essential hypertension 04/10/2007    HYPERTENSION (ICD-401.9) - prev on Micardis 80mg /d then switched to Losartan 100mg /d, but he lost his job in 2011& couldn't afford Rx;  He called [place] 2/12 w/ this news & we phoned in Lisinopril 20mg /d... ~  2DEcho 11/09 showed sl incr LVwall thickness, norm LVF w/ EF= 55-60%, no regional wall motion abn, mild DD, mild MR... ~  Myoview 12/09 showed LBBB, no perfusion defects, abn septal motion & EF   GERD 03/26/2008    GERD (ICD-530.81) - prev mild GERD symptoms that improved after cholecystectomy on 2000... uses OTC PPI as needed... ~  he is due for routine screening colonoscopy...       GERD (gastroesophageal reflux disease)    Headache(784.0)    Hypertension    Mixed hyperlipidemia 06/19/2019   Peripheral vascular disease ([place])    Sciatica 11/18/2019   Tobacco use disorder    Past Surgical History:  Procedure Laterality Date   ABDOMINAL AORTOGRAM W/LOWER EXTREMITY N/A 12/14/2020   Procedure: ABDOMINAL AORTOGRAM W/LOWER EXTREMITY;  Surgeon: [name], MD;  Location: [facility];  Service: Cardiovascular;  Laterality: N/A;   ABDOMINAL AORTOGRAM W/LOWER EXTREMITY N/A 05/24/2021   Procedure: ABDOMINAL AORTOGRAM W/LOWER EXTREMITY;  Surgeon: [name], MD;  Location: [facility];  Service: Cardiovascular;  Laterality: N/A;   laproscopic cholecystectomy  2000   by Dr. Abran Cantor   PACEMAKER INSERTION  03/2008   by Dr. Lovena Le   PERIPHERAL VASCULAR INTERVENTION Left 12/14/2020   Procedure: [place];  Surgeon: [name], MD;  Location: [facility];  Service: Cardiovascular;  Laterality: Left;  SFA   PERIPHERAL VASCULAR INTERVENTION Left 05/24/2021   Procedure: PERIPHERAL VASCULAR INTERVENTION;  Surgeon: [name], MD;  Location: [facility];  Service: Cardiovascular;   Laterality: Left;   PPM GENERATOR CHANGEOUT N/A 05/25/2021   Procedure: PPM GENERATOR CHANGEOUT;  Surgeon: [name], MD;  Location: [facility];  Service: Cardiovascular;  Laterality: N/A;    Family History  Problem Relation Age of Onset   Diabetes Brother    Social History   Socioeconomic History   Marital status: Legally Separated    Spouse name: Not on file   Number of children: 2   Years of education: Not on file   Highest education level: Not on file  Occupational History   Occupation: unemployed    Comment: works for himself  Tobacco Use   Smoking status: Every Day    Packs/day: 1.00    Years: 25.00    Pack years: 25.00    Types: Cigarettes   Smokeless tobacco: Never  Vaping Use   Vaping Use: Never used  Substance and Sexual Activity   Alcohol use:  Not Currently    Alcohol/week: 0.0 standard drinks   Drug use: No   Sexual activity: Not Currently  Other Topics Concern   Not on file  Social History Narrative   2 siblings died with leukemia around age 65   Social Determinants of Health   Financial Resource Strain: Not on file  Food Insecurity: Not on file  Transportation Needs: Not on file  Physical Activity: Not on file  Stress: Not on file  Social Connections: Not on file    Review of Systems  Constitutional:  Negative for chills and fever.  HENT:  Negative for congestion, rhinorrhea and sore throat.   Eyes:  Negative for visual disturbance.  Respiratory:  Negative for cough and shortness of breath.   Cardiovascular:  Negative for chest pain and palpitations.  Gastrointestinal:  Negative for abdominal pain, constipation, diarrhea, nausea and vomiting.  Genitourinary:  Negative for dysuria and urgency.  Musculoskeletal:  Negative for arthralgias, back pain and myalgias.       Left foot pain   Neurological:  Negative for dizziness and headaches.  Psychiatric/Behavioral:  Negative for dysphoric mood. The patient is not nervous/anxious.      Objective:  BP (!) 150/70    Pulse 100    Temp (!) 97.4 F (36.3 C)    Ht 5\' 8"  (1.727 m)    Wt 141 lb 6.4 oz (64.1 kg)    BMI 21.50 kg/m   BP/Weight 06/17/2021 05/25/2021 [id number]  Systolic BP [id number] [id number]  Diastolic BP 70 81 53  Wt. (Lbs) 141.4 148 148  BMI 21.5 21.24 21.24    Physical Exam Vitals reviewed.  Constitutional:      Appearance: Normal appearance.  HENT:     Right Ear: Tympanic membrane normal.     Left Ear: Tympanic membrane normal.  Cardiovascular:     Rate and Rhythm: Normal rate and regular rhythm.     Pulses: Normal pulses.     Heart sounds: Normal heart sounds. No murmur heard.   No gallop.     Comments: Pacemaker Poor pulses in left with gangrene left foot Musculoskeletal:     Cervical back: Normal range of motion.     Right lower leg: No edema.     Left lower leg: No edema.     Comments: Chronic back pain  Skin:    General: Skin is warm.     Capillary Refill: Capillary refill takes 2 to 3 seconds.  Neurological:     Mental Status: He is alert.    Diabetic Foot Exam - Simple   Simple Foot Form Diabetic Foot exam was performed with the following findings: Yes 06/17/2021  8:25 AM  Visual Inspection See comments: Yes Sensation Testing See comments: Yes Pulse Check See comments: Yes Comments Onchomycosis, toe abnormaities, decreased sensation. Poor vascular filling      Lab Results  Component Value Date   WBC 12.6 (H) 05/19/2021   HGB 15.3 05/24/2021   HCT 45.0 05/24/2021   PLT 217 05/19/2021   GLUCOSE 180 (H) 05/24/2021   CHOL 88 (L) 01/12/2021   TRIG 175 (H) 01/12/2021   HDL 27 (L) 01/12/2021   LDLDIRECT 113.0 12/20/2017   LDLCALC 32 01/12/2021   ALT 17 01/12/2021   AST 17 01/12/2021   NA 138 05/24/2021   K 4.1 05/24/2021   CL 101 05/24/2021   CREATININE 0.70 05/24/2021   BUN 13 05/24/2021   CO2 25 05/19/2021   TSH 1.46 12/20/2017   PSA 0.63  12/20/2017   HGBA1C 8.2 (H) 01/12/2021   MICROALBUR 2.9 (H) 02/07/2017       Assessment & Plan:   Problem List Items Addressed This Visit       Cardiovascular and Mediastinum   Essential hypertension   Relevant Orders   Comprehensive metabolic panel   CBC with Differential/Platelet An individual hypertension care plan was established and reinforced today.  The patient's status was assessed using clinical findings on exam and labs or diagnostic tests. The patient's success at meeting treatment goals on disease specific evidence-based guidelines and found to be fair controlled. We discussed DASH diet SELF MANAGEMENT: The patient and I together assessed ways to personally work towards obtaining the recommended goals. RECOMMENDATIONS: avoid decongestants found in common cold remedies, decrease consumption of alcohol, perform routine monitoring of BP with home BP cuff, exercise, reduction of dietary salt, take medicines as prescribed, try not to miss doses and quit smoking.  Regular exercise and maintaining a healthy weight is needed.  Stress reduction may help. A CLINICAL SUMMARY including written plan identify barriers to care unique to individual due to social or financial issues.  We attempt to mutually creat solutions for individual and family understanding.     Carotid artery disease [facility] An individual plan was formulated based on patient history and exam, labs and evidence based data. Patient has not had recent angina or nitroglycerin use. continue present treatment. 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  he is doing well.  A plan of care was formulated using patient exam, tests and other sources to optimize care using evidence based information.  Recommend no smoking, no eating after supper, avoid  fatty foods, elevate Head of bed, avoid tight fitting clothing.  Continue on OTC.      Endocrine   Diabetes mellitus with neuropathy ([place]) - Primary   Relevant Orders   Hemoglobin A1c   Microalbumin / creatinine urine ratio An individual care plan for diabetes was established and reinforced today.  The patient's status was assessed using clinical findings on exam, labs and diagnostic testing. Patient success at meeting goals based on disease specific evidence-based guidelines and found to be fair controlled. Not on diet Medications were assessed and patient's understanding of the medical issues , including barriers were assessed. Recommend adherence to a diabetic diet, a graduated exercise program, HgbA1c level is checked quarterly, and urine microalbumin performed yearly .  Annual mono-filament sensation testing performed. Lower blood pressure and control hyperlipidemia is important. Get annual eye exams and annual flu shots and smoking cessation discussed.  Self management goals were discussed.      Other   Cardiac pacemaker in situ PACER doing well       Mixed hyperlipidemia   Relevant Orders   Lipid panel AN INDIVIDUAL CARE PLAN for hyperlipidemia/ cholesterol was established and reinforced today.  The patient's status was assessed using clinical findings on exam, lab and other diagnostic tests. The patient's disease status was assessed based on evidence-based guidelines and found to be fair controlled. With multiple risk factors, he needs improvement. MEDICATIONS were reviewed. SELF MANAGEMENT GOALS have been discussed and patient's success at attaining the goal of low cholesterol was assessed. RECOMMENDATION given include regular exercise 3 days a week  and low cholesterol/low fat diet. CLINICAL SUMMARY including written plan to identify barriers unique to the patient due to social or economic  reasons was discussed.   . 30 minute visit with review of records   Orders Placed This  Encounter  Procedures   Comprehensive metabolic panel   Hemoglobin A1c   Lipid panel   Microalbumin / creatinine urine ratio   CBC with Differential/Platelet     Follow-up: Return in about 3 months (around 09/14/2021) for shannon.  An After Visit Summary was printed and given to the patient.  Reinaldo Meeker, MD [facility] [phone]

## 2021-06-17 ENCOUNTER — Ambulatory Visit (INDEPENDENT_AMBULATORY_CARE_PROVIDER_SITE_OTHER): Payer: PPO | Admitting: Legal Medicine

## 2021-06-17 ENCOUNTER — Ambulatory Visit (INDEPENDENT_AMBULATORY_CARE_PROVIDER_SITE_OTHER): Payer: PPO

## 2021-06-17 ENCOUNTER — Encounter: Payer: Self-pay | Admitting: Legal Medicine

## 2021-06-17 ENCOUNTER — Other Ambulatory Visit: Payer: Self-pay

## 2021-06-17 VITALS — BP 150/70 | HR 100 | Temp 97.4°F | Ht 68.0 in | Wt 141.4 lb

## 2021-06-17 DIAGNOSIS — K219 Gastro-esophageal reflux disease without esophagitis: Secondary | ICD-10-CM | POA: Diagnosis not present

## 2021-06-17 DIAGNOSIS — I442 Atrioventricular block, complete: Secondary | ICD-10-CM

## 2021-06-17 DIAGNOSIS — E1169 Type 2 diabetes mellitus with other specified complication: Secondary | ICD-10-CM

## 2021-06-17 DIAGNOSIS — I779 Disorder of arteries and arterioles, unspecified: Secondary | ICD-10-CM | POA: Diagnosis not present

## 2021-06-17 DIAGNOSIS — Z950 Presence of cardiac pacemaker: Secondary | ICD-10-CM

## 2021-06-17 DIAGNOSIS — E785 Hyperlipidemia, unspecified: Secondary | ICD-10-CM | POA: Diagnosis not present

## 2021-06-17 DIAGNOSIS — E0840 Diabetes mellitus due to underlying condition with diabetic neuropathy, unspecified: Secondary | ICD-10-CM

## 2021-06-17 DIAGNOSIS — I739 Peripheral vascular disease, unspecified: Secondary | ICD-10-CM | POA: Diagnosis not present

## 2021-06-17 DIAGNOSIS — I96 Gangrene, not elsewhere classified: Secondary | ICD-10-CM

## 2021-06-17 DIAGNOSIS — J449 Chronic obstructive pulmonary disease, unspecified: Secondary | ICD-10-CM

## 2021-06-17 DIAGNOSIS — I10 Essential (primary) hypertension: Secondary | ICD-10-CM | POA: Diagnosis not present

## 2021-06-17 DIAGNOSIS — E782 Mixed hyperlipidemia: Secondary | ICD-10-CM | POA: Diagnosis not present

## 2021-06-17 MED ORDER — GLIMEPIRIDE 4 MG PO TABS: 4.0000 mg | ORAL_TABLET | Freq: Every day | ORAL | 1 refills | Status: DC

## 2021-06-17 MED ORDER — METFORMIN HCL ER 500 MG PO TB24: ORAL_TABLET | ORAL | 1 refills | Status: DC

## 2021-06-17 MED ORDER — ATORVASTATIN CALCIUM 20 MG PO TABS: 20.0000 mg | ORAL_TABLET | Freq: Every day | ORAL | 1 refills | Status: DC

## 2021-06-17 MED ORDER — HYDROCHLOROTHIAZIDE 12.5 MG PO CAPS: 12.5000 mg | ORAL_CAPSULE | Freq: Every day | ORAL | 1 refills | Status: DC

## 2021-06-17 MED ORDER — CLOPIDOGREL BISULFATE 75 MG PO TABS: 75.0000 mg | ORAL_TABLET | Freq: Every day | ORAL | 1 refills | Status: DC

## 2021-06-17 MED ORDER — GABAPENTIN 300 MG PO CAPS: 600.0000 mg | ORAL_CAPSULE | Freq: Three times a day (TID) | ORAL | 1 refills | Status: DC

## 2021-06-17 MED ORDER — LISINOPRIL 20 MG PO TABS: ORAL_TABLET | ORAL | 1 refills | Status: DC

## 2021-06-17 MED ORDER — RYBELSUS 7 MG PO TABS: 7.0000 mg | ORAL_TABLET | Freq: Every day | ORAL | 1 refills | Status: DC

## 2021-06-17 MED ORDER — GABAPENTIN 300 MG PO CAPS: 300.0000 mg | ORAL_CAPSULE | Freq: Three times a day (TID) | ORAL | 3 refills | Status: DC

## 2021-06-17 NOTE — Progress Notes (Signed)
Chronic Care Management Pharmacy Note  06/17/2021 Name:  Andrew Wiggins MRN:  [id number] DOB:  1955/10/01  Summary: -Pleasant 66 year old male presents for initial CCM visit. Worked at [employer] for 24 years before they left. 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 Due to his disability, he is unable to go out to his garage and work  Recommendations/Changes made from today's visit: -Recommend increasing Metformin if A1c labs from today elevated -Onboard to [facility], will ask [facility]  Subjective: Andrew Wiggins is an 66 y.o. year old male who is a primary patient of [name], [name], MD.  The CCM team was consulted for assistance with disease management and care coordination needs.    Engaged with patient by telephone for follow up visit in response to provider referral for pharmacy case management and/or care coordination services.   Consent to Services:  The patient was given the following information about Chronic Care Management services today, agreed to services, and gave verbal consent: 1. CCM service includes personalized support from designated clinical staff supervised by the primary care provider, including individualized plan of care and coordination with other care providers 2. 24/7 contact phone numbers for assistance for urgent and routine care needs. 3. Service will only be billed when office clinical staff spend 20 minutes or more in a month to coordinate care. 4. Only one practitioner may furnish and bill the service in a calendar month. 5.The patient may stop CCM services at any time (effective at the end of the month) by phone call to the office staff. 6. The patient will be responsible for cost sharing (co-pay) of up to 20% of the service fee (after annual deductible is met). Patient agreed to services and consent obtained.  Patient Care Team: [name], MD as PCP - General (Family Medicine) [name], DO as PCP - Cardiology (Cardiology) [name], MD as PCP - Electrophysiology (Cardiology) Lane Hacker, [facility] (Pharmacist)  Recent office visits:  01-26-2021 [name], MD. START rybelsus 7 mg daily.   01-12-2021 [name], MD. STOP plavix and rosuvastatin. START gabapentin 300 mg 3 times daily.    12-03-2020 [name], MD. STOP aspirin patient preference. STOP Voltaren.   Recent consult visits:  01-19-2021 [name], DO (Cardiology). STOP amlodipine. Take atorvastatin 20 mg at night instead of morning. WBC= 11.7, neutrophils= 7.4, lymphocytes= 3.4. Glucose= 262. A1C= 8.2. Cholesterol= 88, trig= 175, HDL= 27.   12-29-2020 [name], PA-C (Vascular and vein). Follow up in 6 months.   12-14-2020 [name], MD. Abdominal aortogram w/ lower extremity procedure. START Aspirin 81 mg daily, rosuvastatin 10 mg daily, keflex 500 mg 4 times daily for 10 days, plavix 75 mg daily.   12-10-2020 [name], MD (Vascular and vein). Initial consult for surgery.   11-17-2020 [name], MD. Unable to view encounter   10-20-2020 [name], MD. Unable to view encounter   10-06-2020 [name], DO (Cardiology). START atorvastatin 20 mg daily and HCTZ 12.5 mg daily.   09-28-2020 [name], RN ([facility]). Pacemaker check.   09-22-2020 [name], MD. Unable to view encounter.   09-08-2020 [name], MD (Ophthalmology). Unable to view encounter.   08-25-2020 [name], MD. Unable to view encounter.   08-09-2020 [name], MD (Ophthalmology). Unable to view encounter.  Hospital visits:  None in previous 6 months   Objective:  Lab Results  Component Value Date   CREATININE 0.70 05/24/2021   BUN 13 05/24/2021   GFR 83.08 12/20/2017   GFRNONAA 90 11/18/2019   GFRAA 104 11/18/2019   NA 138 05/24/2021   K 4.1  05/24/2021   CALCIUM 9.4 05/19/2021   CO2 25 05/19/2021   GLUCOSE 180 (H) 05/24/2021    Lab Results  Component Value Date/Time   HGBA1C 8.2 (H) 01/12/2021 11:00 AM   HGBA1C 7.3 (H) 11/18/2019 10:23 AM   FRUCTOSAMINE 284 02/07/2017 02:03 PM   GFR 83.08 12/20/2017 01:20 PM   GFR 98.24 07/04/2017 12:36 PM   MICROALBUR 2.9 (H) 02/07/2017 02:03 PM    Last diabetic Eye exam:  Lab Results  Component Value Date/Time   HMDIABEYEEXA No Retinopathy 07/29/2020 12:00 AM    Last diabetic Foot exam: No results found for: HMDIABFOOTEX   Lab Results  Component Value Date   CHOL 88 (L) 01/12/2021   HDL 27 (L) 01/12/2021   LDLCALC 32 01/12/2021   LDLDIRECT 113.0 12/20/2017   TRIG 175 (H) 01/12/2021   CHOLHDL 3.3 01/12/2021    Hepatic Function Latest Ref Rng & Units 01/12/2021 11/18/2019 06/20/2019  Total Protein 6.0 - 8.5 g/dL 6.7 6.8 7.2  Albumin 3.8 - 4.8 g/dL 4.3 4.3 4.3  AST 0 - 40 IU/L $Remov'17 14 12  'reyztM$ ALT 0 - 44 IU/L $Remov'17 14 14  'eEOzCZ$ Alk Phosphatase 44 - 121 IU/L 103 98 107  Total Bilirubin 0.0 - 1.2 mg/dL 0.3 0.5 0.4  Bilirubin, Direct 0.0 - 0.3 mg/dL - - -    Lab Results  Component Value Date/Time   TSH 1.46 12/20/2017 01:20 PM   TSH 1.20 06/07/2016 11:15 AM    CBC Latest Ref Rng & Units 05/24/2021 05/19/2021 01/12/2021  WBC 3.4 - 10.8 x10E3/uL - 12.6(H) 11.7(H)  Hemoglobin 13.0 - 17.0 g/dL 15.3 14.9 14.1  Hematocrit 39.0 - 52.0 % 45.0 43.3 41.2  Platelets 150 - 450 x10E3/uL - 217 206    No results found for: VD25OH  Clinical ASCVD: Yes  The ASCVD Risk score (Arnett DK, et al., 2019) failed to calculate for the following reasons:   The valid total cholesterol range is 130 to 320 mg/dL    Depression screen [facility] 2/9 01/26/2021 01/12/2021 06/16/2020  Decreased Interest 0 0 0  Down, Depressed, Hopeless 0 0 0  PHQ - 2 Score 0 0 0     Other: (CHADS2VASc if Afib, MMRC or CAT for COPD, ACT, DEXA)  Social History   Tobacco Use  Smoking Status Every Day   Packs/day: 1.00   Years: 25.00    Pack years: 25.00   Types: Cigarettes  Smokeless Tobacco Never   BP Readings from Last 3 Encounters:  06/17/21 (!) 150/70  05/25/21 (!) 150/81  05/24/21 (!) 111/53   Pulse Readings from Last 3 Encounters:  06/17/21 100  05/25/21 (!) 108  05/24/21 63   Wt Readings from Last 3 Encounters:  06/17/21 141 lb 6.4 oz (64.1 kg)  05/25/21 148 lb (67.1 kg)  05/24/21 148 lb (67.1 kg)   BMI Readings from Last 3 Encounters:  06/17/21 21.50 kg/m  05/25/21 21.24 kg/m  05/24/21 21.24 kg/m    Assessment/Interventions: Review of patient past medical history, allergies, medications, health status, including review of consultants reports, laboratory and other test data, was performed as part of comprehensive evaluation and provision of chronic care management services.   SDOH:  (Social  Determinants of Health) assessments and interventions performed: Yes SDOH Interventions    Flowsheet Row Most Recent Value  SDOH Interventions   Financial Strain Interventions Intervention Not Indicated  Transportation Interventions Intervention Not Indicated      SDOH Screenings   Alcohol Screen: Not on file  Depression (PHQ2-9): Low Risk    PHQ-2 Score: 0  Financial Resource Strain: Low Risk    Difficulty of Paying Living Expenses: Not hard at all  Food Insecurity: Not on file  Housing: Not on file  Physical Activity: Not on file  Social Connections: Not on file  Stress: Not on file  Tobacco Use: High Risk   Smoking Tobacco Use: Every Day   Smokeless Tobacco Use: Never   Passive Exposure: Not on file  Transportation Needs: No Transportation Needs   Lack of Transportation (Medical): No   Lack of Transportation (Non-Medical): No    CCM Care Plan  No Known Allergies  Medications Reviewed Today     Reviewed by Lane Hacker, [facility] (Pharmacist) on 06/17/21 at [place] List Status: <None>   Medication Order Taking? Sig Documenting Provider Last Dose Status Informant  aspirin EC 81 MG  tablet [id number] Yes Take 1 tablet (81 mg total) by mouth daily. Swallow whole. [name], MD Taking Active Self  atorvastatin (LIPITOR) 20 MG tablet [id number] Yes Take 20 mg by mouth daily. [provider] Taking Active Self  clopidogrel (PLAVIX) 75 MG tablet [id number] Yes Take 75 mg by mouth daily. [provider] Taking Active Self  gabapentin (NEURONTIN) 300 MG capsule [id number] Yes Take 1 capsule (300 mg total) by mouth 3 (three) times daily. Take 2 capsules 3 times a day [name], MD Taking Active   glimepiride (AMARYL) 4 MG tablet [id number] Yes Take 1 tablet by mouth once daily [name], MD Taking Active   hydrochlorothiazide (MICROZIDE) 12.5 MG capsule [id number]  Take 1 capsule (12.5 mg total) by mouth daily. [name], Kardie, DO  Expired 05/25/21 2359 Self  HYDROcodone-acetaminophen (NORCO) 10-325 MG tablet [id number]  Take 1 tablet by mouth 3 (three) times daily. [provider]  Active Self  lisinopril (ZESTRIL) 20 MG tablet [id number] Yes TAKE 1 TABLET BY MOUTH IN THE MORNING AND AT BEDTIME [name], Kardie, DO Taking Active Self  metFORMIN (GLUCOPHAGE-XR) 500 MG 24 hr tablet [id number] Yes TAKE 1 TABLET BY MOUTH ONCE DAILY WITH EVENING MEAL [name], MD Taking Active   Semaglutide [facility]) 7 MG TABS [id number] Yes Take 7 mg by mouth daily. 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[place])    Dermatitis, contact    GERD (gastroesophageal reflux disease)    PVD (peripheral vascular disease) ([place])    Sciatica 11/18/2019   Mixed hyperlipidemia 06/19/2019   Diabetes mellitus with neuropathy ([place]) 12/20/2016   Allergic rhinitis 09/04/2012   Dyslipidemia 03/06/2012   Carotid artery  disease ([place]) 07/27/2010   Cardiac pacemaker in situ 11/26/2008   Cardiac conduction disorder 11/26/2008   CIGARETTE SMOKER 03/26/2008   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   GERD 03/26/2008   Essential hypertension 04/10/2007   Headache(784.0) 04/10/2007    Immunization History  Administered  Date(s) Administered   Influenza Whole 04/15/2009   Influenza,inj,Quad PF,6+ Mos 06/07/2015, 06/07/2016   Pneumococcal Polysaccharide-23 04/15/2009    Conditions to be addressed/monitored:  Hypertension, Hyperlipidemia, and Diabetes  Care Plan : [place]  Updates made by Lane Hacker, RPH since 06/17/2021 12:00 AM     Problem: DM, HTN, Pain   Priority: High  Onset Date: 03/14/2021     Goal: Disease State Management   Start Date: 03/14/2021  Expected End Date: 03/14/2022  Recent Progress: On track  Priority: High  Note:   Current Barriers:  Unable to self administer medications as prescribed  Pharmacist Clinical Goal(s):  Patient will contact provider office for questions/concerns as evidenced notation of same in electronic health record through collaboration with PharmD and provider.   Interventions: 1:1 collaboration with [name], MD regarding development and update of comprehensive plan of care as evidenced by provider attestation and co-signature Inter-disciplinary care team collaboration (see longitudinal plan of care) Comprehensive medication review performed; medication list updated in electronic medical record  Hypertension (BP goal <140/90) BP Readings from Last 3 Encounters:  03/07/21 109/67  01/26/21 132/62  01/19/21 111/68  -Controlled -Current treatment: Lisinopril 20mg  BID Appropriate, Effective, Safe, Accessible HCTZ 12.5mg  QD Appropriate, Effective, Safe, Accessible -Medications previously tried: Amlodipine  -Current home readings: Doesn't test -Current dietary habits: "Tries to  eat healthy" -Current exercise habits: None -Denies hypotensive/hypertensive symptoms -Educated on BP goals and benefits of medications for prevention of heart attack, stroke and kidney damage; -Counseled to monitor BP at home as needed, document, and provide log at future appointments -Recommended to continue current medication  CAD -Unknown control -Current treatment  ASA 81mg  Appropriate, Effective, Safe, Accessible Clopidogrel 75mg  Appropriate, Effective, Safe, Accessible -Medications previously tried: N/A  November 2022: Per [facility] and note, only on ASA 81mg . Patient said [facility] told him to take both, will let Cardio know and alert patient based on response Feb 2023: After recent procedures, back on DAPT per patient  Hyperlipidemia: (LDL goal < 70) The ASCVD Risk score (Arnett DK, et al., 2019) failed to calculate for the following reasons:   The valid total cholesterol range is 130 to 320 mg/dL Lab Results  Component Value Date   CHOL 88 (L) 01/12/2021   CHOL 150 11/18/2019   CHOL 148 06/20/2019   Lab Results  Component Value Date   HDL 27 (L) 01/12/2021   HDL 33 (L) 11/18/2019   HDL 30 (L) 06/20/2019   Lab Results  Component Value Date   LDLCALC 32 01/12/2021   LDLCALC 97 11/18/2019   LDLCALC 91 06/20/2019   Lab Results  Component Value Date   TRIG 175 (H) 01/12/2021   TRIG 108 11/18/2019   TRIG 151 (H) 06/20/2019   Lab Results  Component Value Date   CHOLHDL 3.3 01/12/2021   CHOLHDL 4.5 11/18/2019   CHOLHDL 4.9 06/20/2019   Lab Results  Component Value Date   LDLDIRECT 113.0 12/20/2017   LDLDIRECT 110.0 06/07/2016   LDLDIRECT 118.9 03/06/2012  -Controlled -Current treatment: Atorvastatin 20mg  Appropriate, Effective, Safe, Accessible -Medications previously tried: N/A  -Current home readings: Doesn't test -Current dietary habits: "Tries to eat healthy" -Educated on Cholesterol goals;  November 2022: Patient was taking both statins, [facility]  told him this month to only take Rosuvastatin, Cardio note has Atorvastatin. Sent msg to Cardio asking them to clarify  Diabetes (A1c goal <8%) Lab Results  Component Value Date   HGBA1C 8.2 (H) 01/12/2021   HGBA1C  7.3 (H) 11/18/2019   HGBA1C 7.1 (H) 06/20/2019   Lab Results  Component Value Date   MICROALBUR 2.9 (H) 02/07/2017   LDLCALC 32 01/12/2021   CREATININE 0.94 01/12/2021   Lab Results  Component Value Date   NA 137 01/12/2021   K 3.8 01/12/2021   CREATININE 0.94 01/12/2021   EGFR 89 01/12/2021   GFRNONAA 90 11/18/2019   GLUCOSE 262 (H) 01/12/2021   Lab Results  Component Value Date   WBC 11.7 (H) 01/12/2021   HGB 14.1 01/12/2021   HCT 41.2 01/12/2021   MCV 94 01/12/2021   PLT 206 01/12/2021  -Uncontrolled -Current medications: Metformin 500mg  ER Appropriate, Query effective, Safe, Accessible Rybelsus 7mg  QD Appropriate, Query effective, Safe, Accessible -Medications previously tried: N/A  -Current home glucose readings fasting glucose: Didn't have list post prandial glucose: Doesn't test -Denies hypoglycemic/hyperglycemic symptoms -Current meal patterns: Didn't specify -Current exercise: None -Educated on Complications of diabetes including kidney damage, retinal damage, and cardiovascular disease; -Counseled to check feet daily and get yearly eye exams November 2022: Denied Hypoglycemia issues Feb 2023: Recommend increasing Metformin  Tobacco use (Goal Cessation) Tobacco Use: High Risk   Smoking Tobacco Use: Every Day   Smokeless Tobacco Use: Never   Passive Exposure: Not on file   Social History   Tobacco Use  Smoking Status Every Day   Packs/day: 1.00   Years: 25.00   Pack years: 25.00   Types: Cigarettes  Smokeless Tobacco Never  -Uncontrolled -Previous quit attempts:  -Current treatment  None -On a scale of 1-10, reports MOTIVATION to quit is 0 -On a scale of 1-10, reports CONFIDENCE in quitting is 0 -Provided contact information for  [place] Quit Line (1-800-QUIT-NOW) and encouraged patient to reach out to this group for support. -Recommended to continue current medication   Meds: Verbal consent obtained for [facility] enhanced pharmacy services (medication synchronization, adherence packaging, delivery coordination). A medication sync plan was created to allow patient to get all medications delivered once every 30 to 90 days per patient preference. Patient understands they have freedom to choose pharmacy and clinical pharmacist will coordinate care between all prescribers and [facility].    Patient Goals/Self-Care Activities Patient will:  - take medications as prescribed as evidenced by patient report and record review  Follow Up Plan: The patient has been provided with contact information for the care management team and has been advised to call with any health related questions or concerns.   [place] Constable, Pharm.D. - [phone]   CPP F/U August 2023       Medication Assistance: None required.  Patient affirms current coverage meets needs.  Compliance/Adherence/Medication fill history: [facility]:  Rybelsus 7 mg- Last filled 01-26-2021 30 DS [facility] Metformin 500 mg- Last filled 11-24-2020 90 DS [facility] Lisinopril 20 mg- Last filled 01-19-2021 90 DS [facility] Glimepiride 4 mg- Last filled 11-19-2020 90 DS [facility] Atorvastatin 20 mg- Last filled    Care Gaps: Last annual wellness visit? If applicable: Last eye exam / retinopathy screening? Last diabetic foot exam?  Patient's preferred pharmacy is:  [facility] [address], [place] [address] Phone: [phone] Fax: [phone]   Uses pill box? No -   Pt endorses 50% compliance  We discussed: Benefits of medication synchronization, packaging and delivery as well as enhanced pharmacist oversight with [facility]. Patient decided to: Utilize [facility] for medication  synchronization, packaging and delivery Verbal consent obtained for [facility]  enhanced pharmacy services (medication synchronization, adherence packaging, delivery coordination). A medication sync plan was created to allow patient to get all medications delivered once every 30 to 90 days per patient preference. Patient understands they have freedom to choose pharmacy and clinical pharmacist will coordinate care between all prescribers and [facility].   Care Plan and Follow Up Patient Decision:  Patient agrees to Care Plan and Follow-up.  Plan: The patient has been provided with contact information for the care management team and has been advised to call with any health related questions or concerns.   [place] Constable, Pharm.D. - [phone]   CPP F/U August 2023

## 2021-06-17 NOTE — Patient Instructions (Signed)
Visit Information   Goals Addressed   None    Patient Care Plan: CCM Pharmacy Care Plan     Problem Identified: DM, HTN, Pain   Priority: High  Onset Date: 03/14/2021     Goal: Disease State Management   Start Date: 03/14/2021  Expected End Date: 03/14/2022  Recent Progress: On track  Priority: High  Note:   Current Barriers:  Unable to self administer medications as prescribed  Pharmacist Clinical Goal(s):  Patient will contact provider office for questions/concerns as evidenced notation of same in electronic health record through collaboration with PharmD and provider.   Interventions: 1:1 collaboration with [name], MD regarding development and update of comprehensive plan of care as evidenced by provider attestation and co-signature Inter-disciplinary care team collaboration (see longitudinal plan of care) Comprehensive medication review performed; medication list updated in electronic medical record  Hypertension (BP goal <140/90) BP Readings from Last 3 Encounters:  03/07/21 109/67  01/26/21 132/62  01/19/21 111/68  -Controlled -Current treatment: Lisinopril 25m BID Appropriate, Effective, Safe, Accessible HCTZ 12.528mQD Appropriate, Effective, Safe, Accessible -Medications previously tried: Amlodipine  -Current home readings: Doesn't test -Current dietary habits: "Tries to eat healthy" -Current exercise habits: None -Denies hypotensive/hypertensive symptoms -Educated on BP goals and benefits of medications for prevention of heart attack, stroke and kidney damage; -Counseled to monitor BP at home as needed, document, and provide log at future appointments -Recommended to continue current medication  CAD -Unknown control -Current treatment  ASA 8157mppropriate, Effective, Safe, Accessible Clopidogrel 13m65mpropriate, Effective, Safe, Accessible -Medications previously tried: N/A  November 2022: Per [facility] note, only on ASA 81mg69matient said [facility] told him to take both, will let Cardio know and alert patient based on response Feb 2023: After recent procedures, back on DAPT per patient  Hyperlipidemia: (LDL goal < 70) The ASCVD Risk score (Arnett DK, et al., 2019) failed to calculate for the following reasons:   The valid total cholesterol range is 130 to 320 mg/dL Lab Results  Component Value Date   CHOL 88 (L) 01/12/2021   CHOL 150 11/18/2019   CHOL 148 06/20/2019   Lab Results  Component Value Date   HDL 27 (L) 01/12/2021   HDL 33 (L) 11/18/2019   HDL 30 (L) 06/20/2019   Lab Results  Component Value Date   LDLCALC 32 01/12/2021   LDLCALC 97 11/18/2019   LDLCALC 91 06/20/2019   Lab Results  Component Value Date   TRIG 175 (H) 01/12/2021   TRIG 108 11/18/2019   TRIG 151 (H) 06/20/2019   Lab Results  Component Value Date   CHOLHDL 3.3 01/12/2021   CHOLHDL 4.5 11/18/2019   CHOLHDL 4.9 06/20/2019   Lab Results  Component Value Date   LDLDIRECT 113.0 12/20/2017   LDLDIRECT 110.0 06/07/2016   LDLDIRECT 118.9 03/06/2012  -Controlled -Current treatment: Atorvastatin 20mg 1mopriate, Effective, Safe, Accessible -Medications previously tried: N/A  -Current home readings: Doesn't test -Current dietary habits: "Tries to eat healthy" -Educated on Cholesterol goals;  November 2022: Patient was taking both statins, [facility] told him this month to only take Rosuvastatin, Cardio note has Atorvastatin. Sent msg to Cardio asking them to clarify  Diabetes (A1c goal <8%) Lab Results  Component Value Date   HGBA1C 8.2 (H) 01/12/2021   HGBA1C 7.3 (H) 11/18/2019   HGBA1C 7.1 (H) 06/20/2019   Lab Results  Component Value Date   MICROALBUR 2.9 (H) 02/07/2017   LDLCALC 32 01/12/2021   CREATININE 0.94  01/12/2021   Lab Results  Component Value Date   NA 137 01/12/2021   K 3.8 01/12/2021   CREATININE 0.94 01/12/2021   EGFR 89 01/12/2021   GFRNONAA 90 11/18/2019   GLUCOSE 262 (H) 01/12/2021   Lab  Results  Component Value Date   WBC 11.7 (H) 01/12/2021   HGB 14.1 01/12/2021   HCT 41.2 01/12/2021   MCV 94 01/12/2021   PLT 206 01/12/2021  -Uncontrolled -Current medications: Metformin 514m ER Appropriate, Query effective, Safe, Accessible Rybelsus 732mQD Appropriate, Query effective, Safe, Accessible -Medications previously tried: N/A  -Current home glucose readings fasting glucose: Didn't have list post prandial glucose: Doesn't test -Denies hypoglycemic/hyperglycemic symptoms -Current meal patterns: Didn't specify -Current exercise: None -Educated on Complications of diabetes including kidney damage, retinal damage, and cardiovascular disease; -Counseled to check feet daily and get yearly eye exams November 2022: Denied Hypoglycemia issues Feb 2023: Recommend increasing Metformin  Tobacco use (Goal Cessation) Tobacco Use: High Risk   Smoking Tobacco Use: Every Day   Smokeless Tobacco Use: Never   Passive Exposure: Not on file   Social History   Tobacco Use  Smoking Status Every Day   Packs/day: 1.00   Years: 25.00   Pack years: 25.00   Types: Cigarettes  Smokeless Tobacco Never  -Uncontrolled -Previous quit attempts:  -Current treatment  None -On a scale of 1-10, reports MOTIVATION to quit is 0 -On a scale of 1-10, reports CONFIDENCE in quitting is 0 -Provided contact information for [place] [facility]uit Line (1-800-QUIT-NOW) and encouraged patient to reach out to this group for support. -Recommended to continue current medication   Meds: Verbal consent obtained for [facility] enhanced pharmacy services (medication synchronization, adherence packaging, delivery coordination). A medication sync plan was created to allow patient to get all medications delivered once every 30 to 90 days per patient preference. Patient understands they have freedom to choose pharmacy and clinical pharmacist will coordinate care between all prescribers and [facility].    Patient Goals/Self-Care Activities Patient will:  - take medications as prescribed as evidenced by patient report and record review  Follow Up Plan: The patient has been provided with contact information for the care management team and has been advised to call with any health related questions or concerns.   [facility] [facility] - - [phone] CPP F/U August 2023      Mr. Clarin was given information about Chronic Care Management services today including:  CCM service includes personalized support from designated clinical staff supervised by his physician, including individualized plan of care and coordination with other care providers 24/7 contact phone numbers for assistance for urgent and routine care needs. Standard insurance, coinsurance, copays and deductibles apply for chronic care management only during months in which we provide at least 20 minutes of these services. Most insurances cover these services at 100%, however patients may be responsible for any copay, coinsurance and/or deductible if applicable. This service may help you avoid the need for more expensive face-to-face services. Only one practitioner may furnish and bill the service in a calendar month. The patient may stop CCM services at any time (effective at the end of the month) by phone call to the office staff.  Patient agreed to services and verbal consent obtained.   The patient verbalized understanding of instructions, educational materials, and care plan provided today and declined offer to receive copy of patient instructions, educational materials, and care plan.  The pharmacy team will reach out to the patient  again over the next 60 days.   Lane Hacker, [place]

## 2021-06-18 LAB — LIPID PANEL
Chol/HDL Ratio: 4.1 ratio (ref 0.0–5.0)
Cholesterol, Total: 111 mg/dL (ref 100–199)
HDL: 27 mg/dL — ABNORMAL LOW (ref >39)
LDL Chol Calc (NIH): 48 mg/dL (ref 0–99)
Triglycerides: 227 mg/dL — ABNORMAL HIGH (ref 0–149)
VLDL Cholesterol Cal: 36 mg/dL (ref 5–40)

## 2021-06-18 LAB — COMPREHENSIVE METABOLIC PANEL
ALT: 11 IU/L (ref 0–44)
AST: 17 IU/L (ref 0–40)
Albumin/Globulin Ratio: 1.7 (ref 1.2–2.2)
Albumin: 4.3 g/dL (ref 3.8–4.8)
Alkaline Phosphatase: 117 IU/L (ref 44–121)
BUN/Creatinine Ratio: 11 (ref 10–24)
BUN: 10 mg/dL (ref 8–27)
Bilirubin Total: 0.3 mg/dL (ref 0.0–1.2)
CO2: 24 mmol/L (ref 20–29)
Calcium: 9.5 mg/dL (ref 8.6–10.2)
Chloride: 102 mmol/L (ref 96–106)
Creatinine, Ser: 0.9 mg/dL (ref 0.76–1.27)
Globulin, Total: 2.5 g/dL (ref 1.5–4.5)
Glucose: 177 mg/dL — ABNORMAL HIGH (ref 70–99)
Potassium: 4.5 mmol/L (ref 3.5–5.2)
Sodium: 140 mmol/L (ref 134–144)
Total Protein: 6.8 g/dL (ref 6.0–8.5)
eGFR: 94 mL/min/{1.73_m2} (ref >59)

## 2021-06-18 LAB — CBC WITH DIFFERENTIAL/PLATELET
Basophils Absolute: 0 10*3/uL (ref 0.0–0.2)
Basos: 0 %
EOS (ABSOLUTE): 0.2 10*3/uL (ref 0.0–0.4)
Eos: 1 %
Hematocrit: 43.9 % (ref 37.5–51.0)
Hemoglobin: 14.7 g/dL (ref 13.0–17.7)
Immature Grans (Abs): 0 10*3/uL (ref 0.0–0.1)
Immature Granulocytes: 0 %
Lymphocytes Absolute: 3.2 10*3/uL — ABNORMAL HIGH (ref 0.7–3.1)
Lymphs: 28 %
MCH: 30.9 pg (ref 26.6–33.0)
MCHC: 33.5 g/dL (ref 31.5–35.7)
MCV: 92 fL (ref 79–97)
Monocytes Absolute: 0.9 10*3/uL (ref 0.1–0.9)
Monocytes: 8 %
Neutrophils Absolute: 7.2 10*3/uL — ABNORMAL HIGH (ref 1.4–7.0)
Neutrophils: 63 %
Platelets: 229 10*3/uL (ref 150–450)
RBC: 4.76 x10E6/uL (ref 4.14–5.80)
RDW: 13.5 % (ref 11.6–15.4)
WBC: 11.6 10*3/uL — ABNORMAL HIGH (ref 3.4–10.8)

## 2021-06-18 LAB — HEMOGLOBIN A1C
Est. average glucose Bld gHb Est-mCnc: 226 mg/dL
Hgb A1c MFr Bld: 9.5 % — ABNORMAL HIGH (ref 4.8–5.6)

## 2021-06-18 LAB — CARDIOVASCULAR RISK ASSESSMENT

## 2021-06-18 LAB — MICROALBUMIN / CREATININE URINE RATIO
Creatinine, Urine: 211.4 mg/dL
Microalb/Creat Ratio: 30 mg/g creat — ABNORMAL HIGH (ref 0–29)
Microalbumin, Urine: 63.6 ug/mL

## 2021-06-19 ENCOUNTER — Other Ambulatory Visit: Payer: Self-pay

## 2021-06-19 DIAGNOSIS — I96 Gangrene, not elsewhere classified: Secondary | ICD-10-CM | POA: Insufficient documentation

## 2021-06-19 DIAGNOSIS — I739 Peripheral vascular disease, unspecified: Secondary | ICD-10-CM

## 2021-06-19 HISTORY — DX: Gangrene, not elsewhere classified: I96

## 2021-06-19 NOTE — Progress Notes (Signed)
Glucose 177, kidney tests negative, liver tests normal, A1c 9.5 poor control of diabetes, triglycerides 227 high, we need to consider insulin, microalbuin high, wbc high- any infection? lp

## 2021-06-20 ENCOUNTER — Other Ambulatory Visit: Payer: Self-pay | Admitting: Legal Medicine

## 2021-06-20 DIAGNOSIS — E0840 Diabetes mellitus due to underlying condition with diabetic neuropathy, unspecified: Secondary | ICD-10-CM

## 2021-06-20 MED ORDER — TRESIBA FLEXTOUCH 100 UNIT/ML ~~LOC~~ SOPN: 10.0000 [IU] | PEN_INJECTOR | Freq: Every day | SUBCUTANEOUS | 3 refills | Status: DC

## 2021-06-22 DIAGNOSIS — M5137 Other intervertebral disc degeneration, lumbosacral region: Secondary | ICD-10-CM | POA: Diagnosis not present

## 2021-06-22 DIAGNOSIS — M48061 Spinal stenosis, lumbar region without neurogenic claudication: Secondary | ICD-10-CM | POA: Diagnosis not present

## 2021-06-22 DIAGNOSIS — Z79891 Long term (current) use of opiate analgesic: Secondary | ICD-10-CM | POA: Diagnosis not present

## 2021-06-22 DIAGNOSIS — G894 Chronic pain syndrome: Secondary | ICD-10-CM | POA: Diagnosis not present

## 2021-06-22 DIAGNOSIS — M47816 Spondylosis without myelopathy or radiculopathy, lumbar region: Secondary | ICD-10-CM | POA: Diagnosis not present

## 2021-06-22 DIAGNOSIS — Z1389 Encounter for screening for other disorder: Secondary | ICD-10-CM | POA: Diagnosis not present

## 2021-06-22 DIAGNOSIS — M549 Dorsalgia, unspecified: Secondary | ICD-10-CM | POA: Diagnosis not present

## 2021-06-22 DIAGNOSIS — M5136 Other intervertebral disc degeneration, lumbar region: Secondary | ICD-10-CM | POA: Diagnosis not present

## 2021-06-23 ENCOUNTER — Other Ambulatory Visit: Payer: Self-pay

## 2021-06-27 ENCOUNTER — Ambulatory Visit (HOSPITAL_COMMUNITY)
Admission: RE | Admit: 2021-06-27 | Discharge: 2021-06-27 | Disposition: A | Discharge status: Home / Self Care | Payer: PPO | Source: Ambulatory Visit | Attending: Surgery | Admitting: Surgery

## 2021-06-27 ENCOUNTER — Other Ambulatory Visit: Payer: Self-pay

## 2021-06-27 ENCOUNTER — Ambulatory Visit (INDEPENDENT_AMBULATORY_CARE_PROVIDER_SITE_OTHER): Payer: PPO | Admitting: Surgery

## 2021-06-27 ENCOUNTER — Ambulatory Visit (INDEPENDENT_AMBULATORY_CARE_PROVIDER_SITE_OTHER)
Admission: RE | Admit: 2021-06-27 | Discharge: 2021-06-27 | Disposition: A | Discharge status: Home / Self Care | Payer: PPO | Source: Ambulatory Visit | Attending: Surgery | Admitting: Surgery

## 2021-06-27 ENCOUNTER — Encounter: Payer: Self-pay | Admitting: Surgery

## 2021-06-27 VITALS — BP 133/70 | HR 100 | Temp 98.1°F | Resp 20 | Ht 68.0 in | Wt 142.0 lb

## 2021-06-27 DIAGNOSIS — I739 Peripheral vascular disease, unspecified: Secondary | ICD-10-CM | POA: Insufficient documentation

## 2021-06-27 DIAGNOSIS — I70222 Atherosclerosis of native arteries of extremities with rest pain, left leg: Secondary | ICD-10-CM | POA: Diagnosis not present

## 2021-06-27 NOTE — Progress Notes (Signed)
[facility]  Patient name: Andrew Wiggins MRN: [id number] DOB: [birth year] Sex: male   REASON FOR VISIT:    Follow up  HISOTRY OF PRESENT ILLNESS:    Andrew Wiggins is a 66 y.o. male who has undergone the following:  August 2022: Left superficial femoral artery stenting for a toe ulcer January 2023, left SFA DCB for rest pain  She is currently seeing podiatry in [place].  He is likely going to require total amputation.  Patient is a current smoker.  He has COPD.  He takes a statin for hypercholesterolemia.  He is medically managed for hypertension.  He has type 2 diabetes complicated by neuropathy.   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 ~  Jan11:  Last seen by Cards & pacer reprogrammed...     Cardiac pacemaker in situ    Carotid artery disease ([place]) 07/27/2010   R/O PERIPHERAL VASCULAR DISEASE (ICD-443.9) - on ASA 81mg /d... exam 11/09 shows gr 1-2 sys murmur LSB, but also has prominent bruit to base of right side of his neck w/ right > left Carotid Bruit... ~  CDopplers 11/09 showed severe plaque right ICA, & min plaque on left... no signif ICA stenoses per report. ~  f/u [facility]lers 12/10 showed mild irreg plaque bilat in bulbs & intimal thickening in righ   COPD (chronic obstructive pulmonary disease) with chronic bronchitis ([place]) 03/26/2008   CHRONIC OBSTRUCTIVE PULMONARY DISEASE (ICD-496) - hx recurrent bronchitic episodes in the past... smokes 1/2 - 1ppd x yrs... ~  PFT's 11/08 showed FVC= 3.85 (79%), FEV1= 2.19 (55%), %1sec= 57, mid-flows= 32% predicted:  all c/w mod airflow obstruction...  ~  CXR 12/10 showed left  subclav pacer, mild hyperinflation, NAD...     Dermatitis, contact    due to poison ivy   Diabetes mellitus with neuropathy ([place]) 12/20/2016   Dyslipidemia 03/06/2012   Essential hypertension 04/10/2007    HYPERTENSION (ICD-401.9) - prev on Micardis 80mg /d then switched to Losartan 100mg /d, but he lost his job in 2011& couldn't afford Rx;  He called [place] 2/12 w/ this news & we phoned in Lisinopril 20mg /d... ~  2DEcho 11/09 showed sl incr LVwall thickness, norm LVF w/ EF= 55-60%, no regional wall motion abn, mild DD, mild MR... ~  Myoview 12/09 showed LBBB, no perfusion defects, abn septal motion & EF   GERD 03/26/2008    GERD (ICD-530.81) - prev mild GERD symptoms that improved after cholecystectomy on 2000... uses OTC PPI as needed... ~  he is due for routine screening colonoscopy...       GERD (gastroesophageal reflux disease)    Headache(784.0)    Hypertension    Mixed hyperlipidemia 06/19/2019   Peripheral vascular disease ([place])    Sciatica 11/18/2019   Tobacco use disorder      FAMILY HISTORY:   Family History  Problem Relation Age of Onset   Diabetes Brother     SOCIAL HISTORY:   Social History   Tobacco Use   Smoking status: Every Day    Packs/day: 1.00    Years: 25.00    Pack years: 25.00    Types: Cigarettes   Smokeless tobacco:  Never  Substance Use Topics   Alcohol use: Not Currently    Alcohol/week: 0.0 standard drinks     ALLERGIES:   No Known Allergies   CURRENT MEDICATIONS:   Current Outpatient Medications  Medication Sig Dispense Refill   aspirin EC 81 MG tablet Take 1 tablet (81 mg total) by mouth daily. Swallow whole. 150 tablet 2   atorvastatin (LIPITOR) 20 MG tablet Take 1 tablet (20 mg total) by mouth daily. 90 tablet 1   clopidogrel (PLAVIX) 75 MG tablet Take 1 tablet (75 mg total) by mouth daily. 90 tablet 1   gabapentin (NEURONTIN) 300 MG capsule Take 2 capsules (600 mg total) by mouth 3 (three) times daily. Take 2 capsules 3 times a day 270  capsule 1   glimepiride (AMARYL) 4 MG tablet Take 1 tablet (4 mg total) by mouth daily. 90 tablet 1   hydrochlorothiazide (MICROZIDE) 12.5 MG capsule Take 1 capsule (12.5 mg total) by mouth daily. 90 capsule 1   HYDROcodone-acetaminophen (NORCO) 10-325 MG tablet Take 1 tablet by mouth 3 (three) times daily.     insulin degludec (TRESIBA FLEXTOUCH) 100 UNIT/ML FlexTouch Pen Inject 10 Units into the skin daily. 6 mL 3   lisinopril (ZESTRIL) 20 MG tablet TAKE 1 TABLET BY MOUTH IN THE MORNING AND AT BEDTIME 180 tablet 1   metFORMIN (GLUCOPHAGE-XR) 500 MG 24 hr tablet TAKE 1 TABLET BY MOUTH ONCE DAILY WITH EVENING MEAL 90 tablet 1   Semaglutide (RYBELSUS) 7 MG TABS Take 7 mg by mouth daily. 90 tablet 1   No current facility-administered medications for this visit.    REVIEW OF SYSTEMS:   [X]  denotes positive finding, [ ]  denotes negative finding Cardiac  Comments:  Chest pain or chest pressure:    Shortness of breath upon exertion:    Short of breath when lying flat:    Irregular heart rhythm:        Vascular    Pain in calf, thigh, or hip brought on by ambulation:    Pain in feet at night that wakes you up from your sleep:     Blood clot in your veins:    Leg swelling:         Pulmonary    Oxygen at home:    Productive cough:     Wheezing:         Neurologic    Sudden weakness in arms or legs:     Sudden numbness in arms or legs:     Sudden onset of difficulty speaking or slurred speech:    Temporary loss of vision in one eye:     Problems with dizziness:         Gastrointestinal    Blood in stool:     Vomited blood:         Genitourinary    Burning when urinating:     Blood in urine:        Psychiatric    Major depression:         Hematologic    Bleeding problems:    Problems with blood clotting too easily:        Skin    Rashes or ulcers:        Constitutional    Fever or chills:      PHYSICAL EXAM:   There were no vitals filed for this visit.  GENERAL:  The patient is a well-nourished male, in no acute distress. The vital signs are documented above. CARDIAC:  There is a regular rate and rhythm.  VASCULAR: Nonpalpable pedal pulses PULMONARY: Non-labored respirations ABDOMEN: Soft and non-tender with normal pitched bowel sounds.  MUSCULOSKELETAL: There are no major deformities or cyanosis. NEUROLOGIC: No focal weakness or paresthesias are detected. SKIN: See photo below PSYCHIATRIC: The patient has a normal affect.   STUDIES:   I have reviewed the following studies: +-------+-----------+-----------+------------+------------+   ABI/TBI Today's ABI Today's TBI Previous ABI Previous TBI   +-------+-----------+-----------+------------+------------+   Right   0.82        0.36        0.92         0.41           +-------+-----------+-----------+------------+------------+   Left    0.63        0.36        0.24         0              +-------+-----------+-----------+------------+------------+  Right toe pressure 67 Left toe pressure is 66.  Waveforms are all monophasic   Left: Patent distal SFA stent with no stenosis seen.  50-74% stenosis in the proximal profunda and proximal SFA.  30-49% stenosis in the proximal popliteal artery.   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  He still has diminished blood flow however suspecting that this is from small vessel disease.  We did discuss that because of the small size of his arteries he is likely to not have a very durable percutaneous result and he may require femoral-popliteal bypass graft if he continues to have difficulty with wound healing and progressive recurrent stenosis.  I Minna see him back in 3 months with repeat studies.    Leia Alf, MD, FACS [facility] [phone] Pager [phone]

## 2021-06-30 ENCOUNTER — Ambulatory Visit: Payer: PPO | Admitting: Podiatry

## 2021-07-01 ENCOUNTER — Other Ambulatory Visit: Payer: Self-pay

## 2021-07-01 DIAGNOSIS — I739 Peripheral vascular disease, unspecified: Secondary | ICD-10-CM

## 2021-07-04 ENCOUNTER — Ambulatory Visit: Payer: PPO | Admitting: Podiatry

## 2021-07-05 DIAGNOSIS — E782 Mixed hyperlipidemia: Secondary | ICD-10-CM

## 2021-07-05 DIAGNOSIS — I10 Essential (primary) hypertension: Secondary | ICD-10-CM | POA: Diagnosis not present

## 2021-07-05 DIAGNOSIS — E0840 Diabetes mellitus due to underlying condition with diabetic neuropathy, unspecified: Secondary | ICD-10-CM

## 2021-07-06 ENCOUNTER — Ambulatory Visit: Payer: PPO | Admitting: Sports Medicine

## 2021-07-06 ENCOUNTER — Encounter: Payer: Self-pay | Admitting: Sports Medicine

## 2021-07-06 ENCOUNTER — Other Ambulatory Visit: Payer: Self-pay

## 2021-07-06 DIAGNOSIS — M79672 Pain in left foot: Secondary | ICD-10-CM | POA: Diagnosis not present

## 2021-07-06 DIAGNOSIS — I70222 Atherosclerosis of native arteries of extremities with rest pain, left leg: Secondary | ICD-10-CM | POA: Diagnosis not present

## 2021-07-06 DIAGNOSIS — I739 Peripheral vascular disease, unspecified: Secondary | ICD-10-CM

## 2021-07-06 DIAGNOSIS — E1169 Type 2 diabetes mellitus with other specified complication: Secondary | ICD-10-CM | POA: Diagnosis not present

## 2021-07-06 DIAGNOSIS — I96 Gangrene, not elsewhere classified: Secondary | ICD-10-CM

## 2021-07-06 MED ORDER — AMOXICILLIN-POT CLAVULANATE 875-125 MG PO TABS: 1.0000 | ORAL_TABLET | Freq: Two times a day (BID) | ORAL | 0 refills | Status: DC

## 2021-07-06 NOTE — Progress Notes (Signed)
Subjective: Andrew Wiggins is a 66 y.o. male patient seen in office for evaluation of gangrene at the left fourth toe.  Patient reports that he has been applying Betadine and dry dressing hurts a little with some drainage and had an episode of smelling.  Patient reports that the surgical shoe helps but it feels like he is walking on pebbles.  Patient denies nausea vomiting fever chills or any other constitutional symptoms at this time. Patient saw vascular over a week ago.  Patient Active Problem List   Diagnosis Date Noted   Gangrene of toe ([place]) 06/19/2021   AV block    Routine general medical examination at a health care facility 06/16/2020   Diabetes mellitus ([place]) 02/04/2020   Tobacco use 02/04/2020   Complete AV block ([place])    Dermatitis, contact    GERD (gastroesophageal reflux disease)    PVD (peripheral vascular disease) ([place])    Sciatica 11/18/2019   Mixed hyperlipidemia 06/19/2019   Diabetes mellitus with neuropathy ([place]) 12/20/2016   Allergic rhinitis 09/04/2012   Dyslipidemia 03/06/2012   Carotid artery disease ([place]) 07/27/2010   Cardiac pacemaker in situ 11/26/2008   Cardiac conduction disorder 11/26/2008   CIGARETTE SMOKER 03/26/2008   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 Swallow whole. 150 tablet 2   atorvastatin (LIPITOR) 20 MG tablet Take 1 tablet (20 mg total) by mouth daily. 90 tablet 1   clopidogrel (PLAVIX) 75 MG tablet Take 1 tablet (75 mg total) by mouth daily. 90 tablet 1   gabapentin (NEURONTIN) 300 MG capsule Take 2 capsules (600 mg total) by mouth 3 (three) times daily. Take 2 capsules 3 times a day 270 capsule 1   glimepiride (AMARYL) 4 MG tablet Take 1 tablet (4 mg  total) by mouth daily. 90 tablet 1   hydrochlorothiazide (MICROZIDE) 12.5 MG capsule Take 1 capsule (12.5 mg total) by mouth daily. 90 capsule 1   HYDROcodone-acetaminophen (NORCO) 10-325 MG tablet Take 1 tablet by mouth 3 (three) times daily.     insulin degludec (TRESIBA FLEXTOUCH) 100 UNIT/ML FlexTouch Pen Inject 10 Units into the skin daily. 6 mL 3   lisinopril (ZESTRIL) 20 MG tablet TAKE 1 TABLET BY MOUTH IN THE MORNING AND AT BEDTIME 180 tablet 1   metFORMIN (GLUCOPHAGE-XR) 500 MG 24 hr tablet TAKE 1 TABLET BY MOUTH ONCE DAILY WITH EVENING MEAL 90 tablet 1   Semaglutide (RYBELSUS) 7 MG TABS Take 7 mg by mouth daily. 90 tablet 1   No current facility-administered medications on file prior to visit.   No Known Allergies  Recent Results (from the past 2160 hour(s))  CUP PACEART REMOTE DEVICE CHECK     Status: None   Collection Time: 04/08/21  4:54 PM  Result Value Ref Range   Date Time Interrogation Session [id number]    Pulse Generator Manufacturer MERM    Pulse Gen Model [id number] Adapta    Pulse Gen Serial Number [id number]    Clinic Name [facility] Pulse Generator Type Implantable Pulse Generator    Implantable Pulse Generator Implant Date [id number]    Implantable Lead Manufacturer MERM    Implantable Lead Model 5076 CapSureFix Novus    Implantable Lead Serial Number  [id number]    Implantable Lead Implant Date 31540086    Implantable Lead Location [phone]    Implantable Lead Manufacturer MERM    Implantable Lead Model 5076 CapSureFix Novus    Implantable Lead Serial Number [id number]    Implantable Lead Implant Date 93267124    Implantable Lead Location [phone]    Lead Channel Setting Sensing Sensitivity 5.60 mV   Lead Channel Setting Pacing Pulse Width 0.40 ms   Lead Channel Setting Pacing Amplitude 2.000 V   Lead Channel Impedance Value 67 ohm   Lead Channel Impedance Value 975 ohm   Battery Status RRT    Battery Voltage 2.61 V   Battery Impedance  8,753 ohm   Brady Statistic RV Percent Paced 100 %  CUP PACEART REMOTE DEVICE CHECK     Status: None   Collection Time: 05/11/21  1:34 PM  Result Value Ref Range   Date Time Interrogation Session [id number]    Pulse Generator Manufacturer MERM    Pulse Gen Model ADDRL1 Adapta    Pulse Gen Serial Number [id number]    Clinic Name [facility]    Implantable Pulse Generator Type Implantable Pulse Generator    Implantable Pulse Generator Implant Date 19379024    Implantable Lead Manufacturer [facility]    Implantable Lead Model 5076 CapSureFix Novus    Implantable Lead Serial Number [id number]    Implantable Lead Implant Date 09735329    Implantable Lead Location [id number]    Implantable Lead Manufacturer [facility]    Implantable Lead Model 5076 CapSureFix Novus    Implantable Lead Serial Number [id number]    Implantable Lead Implant Date 92426834    Implantable Lead Location [phone]    Lead Channel Setting Sensing Sensitivity 5.60 mV   Lead Channel Setting Pacing Pulse Width 0.40 ms   Lead Channel Setting Pacing Amplitude 2.000 V   Lead Channel Impedance Value 67 ohm   Lead Channel Impedance Value 967 ohm   Battery Status RRT    Battery Voltage 2.59 V   Battery Impedance 9,203 ohm   Brady Statistic RV Percent Paced 100 %  CBC     Status: Abnormal   Collection Time: 05/19/21 11:10 AM  Result Value Ref Range   WBC 12.6 (H) 3.4 - 10.8 x10E3/uL   RBC 4.69 4.14 - 5.80 x10E6/uL   Hemoglobin 14.9 13.0 - 17.7 g/dL   Hematocrit 43.3 37.5 - 51.0 %   MCV 92 79 - 97 fL   MCH 31.8 26.6 - 33.0 pg   MCHC 34.4 31.5 - 35.7 g/dL   RDW 13.0 11.6 - 15.4 %   Platelets 217 150 - 450 [id number]  Basic metabolic panel     Status: Abnormal   Collection Time: 05/19/21 11:10 AM  Result Value Ref Range   Glucose 281 (H) 70 - 99 mg/dL   BUN 11 8 - 27 mg/dL   Creatinine, Ser 0.92 0.76 - 1.27 mg/dL   eGFR 92 >59 mL/min/1.73   BUN/Creatinine Ratio 12 10 - 24   Sodium 138 134 - 144 mmol/L   Potassium 4.4 3.5 -  5.2 mmol/L   Chloride 98 96 - 106 mmol/L   CO2 25 20 - 29 mmol/L   Calcium 9.4 8.6 - 10.2 mg/dL  I-STAT, chem 8     Status: Abnormal   Collection Time: 05/24/21 10:05 AM  Result Value Ref Range   Sodium 138 135 - 145 mmol/L   Potassium 4.1 3.5 - 5.1 mmol/L   Chloride 101 98 - 111  mmol/L   BUN 13 8 - 23 mg/dL   Creatinine, Ser 0.70 0.61 - 1.24 mg/dL   Glucose, Bld 180 (H) 70 - 99 mg/dL    Comment: Glucose reference range applies only to samples taken after fasting for at least 8 hours.   Calcium, Ion 1.20 1.15 - 1.40 mmol/L   TCO2 26 22 - 32 mmol/L   Hemoglobin 15.3 13.0 - 17.0 g/dL   HCT 45.0 39.0 - 52.0 %  Glucose, capillary     Status: Abnormal   Collection Time: 05/25/21  1:02 PM  Result Value Ref Range   Glucose-Capillary 186 (H) 70 - 99 mg/dL    Comment: Glucose reference range applies only to samples taken after fasting for at least 8 hours.   Comment 1 Notify RN    Comment 2 Document in Chart   CUP PACEART INCLINIC DEVICE CHECK     Status: None   Collection Time: 06/09/21  2:06 PM  Result Value Ref Range   Date Time Interrogation Session [id number]    Pulse Generator Manufacturer MERM    Pulse Gen Model [id number] Azure XT DR MRI    Pulse Gen Serial Number [id number]    Clinic Name [facility] Pulse Generator Type Implantable Pulse Generator    Implantable Pulse Generator Implant Date 60109323    Implantable Lead Manufacturer [facility]    Implantable Lead Model 5076 CapSureFix Novus    Implantable Lead Serial Number [id number]    Implantable Lead Implant Date 27062376    Implantable Lead Location [id number]    Implantable Lead Manufacturer MERM    Implantable Lead Model 5076 CapSureFix Novus    Implantable Lead Serial Number [id number]    Implantable Lead Implant Date 28315176    Implantable Lead Location [id number]    Lead Channel Setting Sensing Sensitivity 0.9 mV   Lead Channel Setting Pacing Amplitude 1.5 V   Lead Channel Setting Pacing Pulse Width 0.4  ms   Lead Channel Setting Pacing Amplitude 2 V   Lead Channel Impedance Value 513 ohm   Lead Channel Impedance Value 399 ohm   Lead Channel Sensing Intrinsic Amplitude 8.375 mV   Lead Channel Pacing Threshold Amplitude 0.75 V   Lead Channel Pacing Threshold Pulse Width 0.4 ms   Lead Channel Impedance Value 874 ohm   Lead Channel Impedance Value 817 ohm   Lead Channel Sensing Intrinsic Amplitude 6.75 mV   Lead Channel Pacing Threshold Amplitude 0.75 V   Lead Channel Pacing Threshold Pulse Width 0.4 ms   Battery Status OK    Battery Remaining Longevity 159 mo   Battery Voltage 3.22 V   Brady Statistic RA Percent Paced 0.83 %   Brady Statistic RV Percent Paced 99.99 %   Brady Statistic AP VP Percent 0.83 %   Brady Statistic AS VP Percent 99.16 %   Brady Statistic AP VS Percent 0 %   Brady Statistic AS VS Percent 0.01 %   Eval Rhythm AS 111/VP 30 with escape   Comprehensive metabolic panel     Status: Abnormal   Collection Time: 06/17/21  8:37 AM  Result Value Ref Range   Glucose 177 (H) 70 - 99 mg/dL   BUN 10 8 - 27 mg/dL   Creatinine, Ser 0.90 0.76 - 1.27 mg/dL   eGFR 94 >59 mL/min/1.73   BUN/Creatinine Ratio 11 10 - 24   Sodium 140 134 - 144 mmol/L   Potassium 4.5 3.5 - 5.2 mmol/L   Chloride 102  96 - 106 mmol/L   CO2 24 20 - 29 mmol/L   Calcium 9.5 8.6 - 10.2 mg/dL   Total Protein 6.8 6.0 - 8.5 g/dL   Albumin 4.3 3.8 - 4.8 g/dL   Globulin, Total 2.5 1.5 - 4.5 g/dL   Albumin/Globulin Ratio 1.7 1.2 - 2.2   Bilirubin Total 0.3 0.0 - 1.2 mg/dL   Alkaline Phosphatase 117 44 - 121 IU/L   AST 17 0 - 40 IU/L   ALT 11 0 - 44 IU/L  Hemoglobin A1c     Status: Abnormal   Collection Time: 06/17/21  8:37 AM  Result Value Ref Range   Hgb A1c MFr Bld 9.5 (H) 4.8 - 5.6 %    Comment:          Prediabetes: 5.7 - 6.4          Diabetes: >6.4          Glycemic control for adults with diabetes: <7.0    Est. average glucose Bld gHb Est-mCnc 226 mg/dL  Lipid panel     Status: Abnormal    Collection Time: 06/17/21  8:37 AM  Result Value Ref Range   Cholesterol, Total 111 100 - 199 mg/dL   Triglycerides 155 (H) 0 - 149 mg/dL   HDL 27 (L) >78 mg/dL   VLDL Cholesterol Cal 36 5 - 40 mg/dL   LDL Chol Calc (NIH) 48 0 - 99 mg/dL   Chol/HDL Ratio 4.1 0.0 - 5.0 ratio    Comment:                                   T. Chol/HDL Ratio                                             Men  Women                               1/2 Avg.Risk  3.4    3.3                                   Avg.Risk  5.0    4.4                                2X Avg.Risk  9.6    7.1                                3X Avg.Risk 23.4   11.0   Microalbumin / creatinine urine ratio     Status: Abnormal   Collection Time: 06/17/21  8:37 AM  Result Value Ref Range   Creatinine, Urine 211.4 Not Estab. mg/dL   Microalbumin, Urine 26.9 Not Estab. ug/mL   Microalb/Creat Ratio 30 (H) 0 - 29 mg/g creat    Comment:                        Normal:                0 -  29  Moderately increased: 30 - 300                        Severely increased:       >300   CBC with Differential/Platelet     Status: Abnormal   Collection Time: 06/17/21  8:37 AM  Result Value Ref Range   WBC 11.6 (H) 3.4 - 10.8 x10E3/uL   RBC 4.76 4.14 - 5.80 x10E6/uL   Hemoglobin 14.7 13.0 - 17.7 g/dL   Hematocrit 43.9 37.5 - 51.0 %   MCV 92 79 - 97 fL   MCH 30.9 26.6 - 33.0 pg   MCHC 33.5 31.5 - 35.7 g/dL   RDW 13.5 11.6 - 15.4 %   Platelets 229 150 - 450 x10E3/uL   Neutrophils 63 Not Estab. %   Lymphs 28 Not Estab. %   Monocytes 8 Not Estab. %   Eos 1 Not Estab. %   Basos 0 Not Estab. %   Neutrophils Absolute 7.2 (H) 1.4 - 7.0 x10E3/uL   Lymphocytes Absolute 3.2 (H) 0.7 - 3.1 x10E3/uL   Monocytes Absolute 0.9 0.1 - 0.9 x10E3/uL   EOS (ABSOLUTE) 0.2 0.0 - 0.4 x10E3/uL   Basophils Absolute 0.0 0.0 - 0.2 x10E3/uL   Immature Granulocytes 0 Not Estab. %   Immature Grans (Abs) 0.0 0.0 - 0.1 x10E3/uL  Cardiovascular Risk Assessment      Status: None   Collection Time: 06/17/21  8:37 AM  Result Value Ref Range   Interpretation Note     Comment: Supplemental report is available.    Objective: There were no vitals filed for this visit.  General: Patient is awake, alert, oriented x 3 and in no acute distress.  Dermatology: Skin is warm and dry bilateral focal localized dry gangrene to the distal tuft of the left fourth toe there is also a small wound noted to the lateral aspect of the left fourth toe that measures less than 0.5 cm with a granular base there is no active drainage or odor noted at this time however subjectively patient does admit that there was an episode of clear drainage and odor and he reports that the swelling is a lot better there is no erythema very minimal edema no other acute signs or symptoms of infection noted at this time.  Nails are elongated and thickened consistent with onychomycosis.      Vascular: DP and PT pedal pulses nonpalpable on the left foot.  Temperature gradient appears to be within normal limits, no pedal hair growth noted, minimal/trace edema noted to the left foot.  Neurologic: Protective sensation absent and subjective pins-and-needles sensation likely consistent with diabetic neuropathy.  Musculosketal: There is no reproducible pain to palpation to the ulcerated/gangrenous area.  No results for input(s): GRAMSTAIN, LABORGA in the last 8760 hours.  Assessment and Plan:  Problem List Items Addressed This Visit       Endocrine   Diabetes mellitus ([place])   Other Visit Diagnoses     Gangrene of toe of left foot ([place])    -  Primary   PAD (peripheral artery disease) ([place])       Critical limb ischemia of left lower extremity ([place])       Left foot pain          -Examined patient and discussed the progression of the wound and treatment alternatives. -Previous Xrays reviewed -At this time due to patient's poor vascular status we will continue to monitor gangrene and let  the  toe continue to auto amputate out of precaution because patient did have a smell I did send Augmentin antibiotic to his pharmacy for him to take as directed.  Applied Betadine and dry dressing to the left fourth toe and advised patient to do the same daily. -Continue with surgical shoe - Advised patient to go to the ER or return to office if the wound worsens or if constitutional symptoms are present. -Patient to return to office in 2 to 3 weeks for follow up care and evaluation or sooner if problems arise.  Landis Martins, DPM

## 2021-07-08 ENCOUNTER — Telehealth: Payer: Self-pay

## 2021-07-08 NOTE — Chronic Care Management (AMB) (Signed)
07/08/2021- Calling [facility], Dr Fredda Hammed @ [phone] to request Hydrocodone refill. No answer, left message on nurse voicemail for refill to [facility]. ? ?Billee Cashing, CMA ?Clinical Pharmacist Assistant ?[phone] ? ?

## 2021-07-18 ENCOUNTER — Ambulatory Visit: Payer: PPO | Admitting: Cardiology

## 2021-07-20 DIAGNOSIS — Z79891 Long term (current) use of opiate analgesic: Secondary | ICD-10-CM | POA: Diagnosis not present

## 2021-07-20 DIAGNOSIS — M5136 Other intervertebral disc degeneration, lumbar region: Secondary | ICD-10-CM | POA: Diagnosis not present

## 2021-07-20 DIAGNOSIS — M48061 Spinal stenosis, lumbar region without neurogenic claudication: Secondary | ICD-10-CM | POA: Diagnosis not present

## 2021-07-20 DIAGNOSIS — M5137 Other intervertebral disc degeneration, lumbosacral region: Secondary | ICD-10-CM | POA: Diagnosis not present

## 2021-07-20 DIAGNOSIS — Z1389 Encounter for screening for other disorder: Secondary | ICD-10-CM | POA: Diagnosis not present

## 2021-07-20 DIAGNOSIS — M47816 Spondylosis without myelopathy or radiculopathy, lumbar region: Secondary | ICD-10-CM | POA: Diagnosis not present

## 2021-07-20 DIAGNOSIS — M549 Dorsalgia, unspecified: Secondary | ICD-10-CM | POA: Diagnosis not present

## 2021-07-20 DIAGNOSIS — G894 Chronic pain syndrome: Secondary | ICD-10-CM | POA: Diagnosis not present

## 2021-07-22 ENCOUNTER — Other Ambulatory Visit: Payer: Self-pay | Admitting: Sports Medicine

## 2021-07-22 MED ORDER — AMOXICILLIN-POT CLAVULANATE 875-125 MG PO TABS: 1.0000 | ORAL_TABLET | Freq: Two times a day (BID) | ORAL | 0 refills | Status: DC

## 2021-07-22 NOTE — Progress Notes (Signed)
Refilled Augmetin until he can be seen ?

## 2021-07-25 ENCOUNTER — Ambulatory Visit (INDEPENDENT_AMBULATORY_CARE_PROVIDER_SITE_OTHER): Payer: PPO | Admitting: Podiatry

## 2021-07-25 DIAGNOSIS — Z91199 Patient's noncompliance with other medical treatment and regimen due to unspecified reason: Secondary | ICD-10-CM

## 2021-07-25 NOTE — Progress Notes (Signed)
No show

## 2021-07-26 ENCOUNTER — Telehealth: Payer: Self-pay | Admitting: *Deleted

## 2021-07-26 NOTE — Telephone Encounter (Signed)
-----   Message from Asencion Islam, [place] sent at 07/22/2021  2:26 PM EDT ----- ?Refilled Augmentin ?----- Message ----- ?From: Lanney Gins, PMAC ?Sent: 07/22/2021   9:12 AM EDT ?To: Asencion Islam, DPM ? ?Patient called yesterday and wanted to say that he was out of antibiotics and did not know if he needed another round. Misty Stanley ? ? ?

## 2021-07-26 NOTE — Telephone Encounter (Signed)
Called and left a message for the patient today stating that Dr Marylene Land sent over an antibiotic for the patient. Misty Stanley ?

## 2021-08-05 ENCOUNTER — Other Ambulatory Visit: Payer: Self-pay | Admitting: Sports Medicine

## 2021-08-15 ENCOUNTER — Telehealth: Payer: Self-pay | Admitting: *Deleted

## 2021-08-15 ENCOUNTER — Encounter: Payer: Self-pay | Admitting: Podiatry

## 2021-08-15 ENCOUNTER — Ambulatory Visit: Payer: PPO | Admitting: Podiatry

## 2021-08-15 DIAGNOSIS — E1169 Type 2 diabetes mellitus with other specified complication: Secondary | ICD-10-CM

## 2021-08-15 DIAGNOSIS — I70222 Atherosclerosis of native arteries of extremities with rest pain, left leg: Secondary | ICD-10-CM

## 2021-08-15 DIAGNOSIS — I739 Peripheral vascular disease, unspecified: Secondary | ICD-10-CM | POA: Diagnosis not present

## 2021-08-15 DIAGNOSIS — I96 Gangrene, not elsewhere classified: Secondary | ICD-10-CM | POA: Diagnosis not present

## 2021-08-15 MED ORDER — AMOXICILLIN-POT CLAVULANATE 875-125 MG PO TABS: 1.0000 | ORAL_TABLET | Freq: Two times a day (BID) | ORAL | 0 refills | Status: AC

## 2021-08-15 NOTE — Progress Notes (Signed)
Subjective: ?Andrew Wiggins is a 65 y.o. male patient seen in office for evaluation of gangrene at the left fourth toe.  Patient reports that he has been applying Betadine and dry dressing and has had improvement with his pain. .    Patient denies nausea vomiting fever chills or any other constitutional symptoms at this time. ? ? ?Patient Active Problem List  ? Diagnosis Date Noted  ? Gangrene of toe ([place]) 06/19/2021  ? AV block   ? Routine general medical examination at a health care facility 06/16/2020  ? Diabetes mellitus ([place]) 02/04/2020  ? Tobacco use 02/04/2020  ? 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 Dermatitis, contact   ? GERD (gastroesophageal reflux disease)   ? PVD (peripheral vascular disease) ([place])   ? Sciatica 11/18/2019  ? Mixed hyperlipidemia 06/19/2019  ? Diabetes mellitus with neuropathy ([place]) 12/20/2016  ? Allergic rhinitis 09/04/2012  ? Dyslipidemia 03/06/2012  ? Carotid artery disease ([place]) 07/27/2010  ? Cardiac pacemaker in situ 11/26/2008  ? 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 CIGARETTE SMOKER 03/26/2008  ? 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 GERD 03/26/2008  ? Essential hypertension 04/10/2007  ? Headache(784.0) 04/10/2007  ? ?Current Outpatient Medications on File Prior to Visit  ?Medication Sig Dispense Refill  ? aspirin EC 81 MG tablet Take 1 tablet (81 mg total) by mouth daily. Swallow whole. 150 tablet 2  ? atorvastatin (LIPITOR) 20 MG tablet Take 1 tablet (20 mg total) by mouth daily. 90 tablet 1  ? clopidogrel (PLAVIX) 75 MG tablet Take 1 tablet (75 mg total) by mouth daily. 90 tablet 1  ? gabapentin (NEURONTIN) 300 MG capsule Take 2 capsules (600 mg total) by mouth 3 (three) times daily. Take 2 capsules 3 times a day 270 capsule 1  ? glimepiride (AMARYL) 4 MG tablet Take 1 tablet (4 mg total) by mouth daily. 90 tablet 1  ? hydrochlorothiazide (MICROZIDE) 12.5 MG capsule Take 1 capsule (12.5 mg total) by mouth daily. 90 capsule 1  ?  HYDROcodone-acetaminophen (NORCO) 10-325 MG tablet Take 1 tablet by mouth 3 (three) times daily.    ? insulin degludec (TRESIBA FLEXTOUCH) 100 UNIT/ML FlexTouch Pen Inject 10 Units into the skin daily. 6 mL 3  ? lisinopril (ZESTRIL) 20 MG tablet TAKE 1 TABLET BY MOUTH IN THE MORNING AND AT BEDTIME 180 tablet 1  ? metFORMIN (GLUCOPHAGE-XR) 500 MG 24 hr tablet TAKE 1 TABLET BY MOUTH ONCE DAILY WITH EVENING MEAL 90 tablet 1  ? Semaglutide (RYBELSUS) 7 MG TABS Take 7 mg by mouth daily. 90 tablet 1  ? ?No current facility-administered medications on file prior to visit.  ? ?No Known Allergies ? ?Recent Results (from the past 2160 hour(s))  ?CBC     Status: Abnormal  ? Collection Time: 05/19/21 11:10 AM  ?Result Value Ref Range  ? WBC 12.6 (H) 3.4 - 10.8 x10E3/uL  ? RBC 4.69 4.14 - 5.80 x10E6/uL  ? Hemoglobin 14.9 13.0 - 17.7 g/dL  ? Hematocrit 43.3 37.5 - 51.0 %  ? MCV 92 79 - 97 fL  ? MCH 31.8 26.6 - 33.0 pg  ? MCHC 34.4 31.5 - 35.7 g/dL  ? RDW 13.0 11.6 - 15.4 %  ? Platelets 217 150 - 450 x10E3/uL  ?Basic metabolic panel     Status: Abnormal  ? Collection Time: 05/19/21 11:10 AM  ?Result Value Ref Range  ? Glucose 281 (H) 70 - 99 mg/dL  ? BUN 11 8 -  27 mg/dL  ? Creatinine, Ser 0.92 0.76 - 1.27 mg/dL  ? eGFR 92 >59 mL/min/1.73  ? BUN/Creatinine Ratio 12 10 - 24  ? Sodium 138 134 - 144 mmol/L  ? Potassium 4.4 3.5 - 5.2 mmol/L  ? Chloride 98 96 - 106 mmol/L  ? CO2 25 20 - 29 mmol/L  ? Calcium 9.4 8.6 - 10.2 mg/dL  ?I-STAT, chem 8     Status: Abnormal  ? Collection Time: 05/24/21 10:05 AM  ?Result Value Ref Range  ? Sodium 138 135 - 145 mmol/L  ? Potassium 4.1 3.5 - 5.1 mmol/L  ? Chloride 101 98 - 111 mmol/L  ? BUN 13 8 - 23 mg/dL  ? Creatinine, Ser 0.70 0.61 - 1.24 mg/dL  ? Glucose, Bld 180 (H) 70 - 99 mg/dL  ?  Comment: Glucose reference range applies only to samples taken after fasting for at least 8 hours.  ? Calcium, Ion 1.20 1.15 - 1.40 mmol/L  ? TCO2 26 22 - 32 mmol/L  ? Hemoglobin 15.3 13.0 - 17.0 g/dL  ? HCT 45.0  39.0 - 52.0 %  ?Glucose, capillary     Status: Abnormal  ? Collection Time: 05/25/21  1:02 PM  ?Result Value Ref Range  ? Glucose-Capillary 186 (H) 70 - 99 mg/dL  ?  Comment: Glucose reference range applies only to samples taken after fasting for at least 8 hours.  ? Comment 1 Notify RN   ? Comment 2 Document in Chart   ?CUP PACEART INCLINIC DEVICE CHECK     Status: None  ? Collection Time: 06/09/21  2:06 PM  ?Result Value Ref Range  ? Date Time Interrogation Session [phone]   ? Pulse Insurance claims handler MERM   ? Pulse Gen Model [id number] Azure XT DR MRI   ? Pulse Gen Serial Number [id number]   ? Clinic Name [facility]   ? Implantable Pulse Generator Type Implantable Pulse Generator   ? Implantable Pulse Generator Implant Date [id number]   ? Implantable Lead Manufacturer MERM   ? Implantable Lead Model 5076 CapSureFix Novus   ? Implantable Lead Serial Number [id number]   ? Implantable Lead Implant Date 88110315   ? Implantable Lead Location [phone]   ? Implantable Lead Manufacturer MERM   ? Implantable Lead Model 5076 CapSureFix Novus   ? Implantable Lead Serial Number [id number]   ? Implantable Lead Implant Date [id number]   ? Implantable Lead Location [id number]   ? Lead Channel Setting Sensing Sensitivity 0.9 mV  ? Lead Channel Setting Pacing Amplitude 1.5 V  ? Lead Channel Setting Pacing Pulse Width 0.4 ms  ? Lead Channel Setting Pacing Amplitude 2 V  ? Lead Channel Impedance Value 513 ohm  ? Lead Channel Impedance Value 399 ohm  ? Lead Channel Sensing Intrinsic Amplitude 8.375 mV  ? Lead Channel Pacing Threshold Amplitude 0.75 V  ? Lead Channel Pacing Threshold Pulse Width 0.4 ms  ? Lead Channel Impedance Value 874 ohm  ? Lead Channel Impedance Value 817 ohm  ? Lead Channel Sensing Intrinsic Amplitude 6.75 mV  ? Lead Channel Pacing Threshold Amplitude 0.75 V  ? Lead Channel Pacing Threshold Pulse Width 0.4 ms  ? Battery Status OK   ? Battery Remaining Longevity 159 mo  ? Battery Voltage 3.22 V  ? Loletha Grayer  Statistic RA Percent Paced 0.83 %  ? Brady Statistic RV Percent Paced 99.99 %  ? Brady Statistic AP VP Percent 0.83 %  ? Brady Statistic AS VP Percent 99.16 %  ?  Brady Statistic AP VS Percent 0 %  ? Brady Statistic AS VS Percent 0.01 %  ? Eval Rhythm AS 111/VP 30 with escape   ?Comprehensive metabolic panel     Status: Abnormal  ? Collection Time: 06/17/21  8:37 AM  ?Result Value Ref Range  ? Glucose 177 (H) 70 - 99 mg/dL  ? BUN 10 8 - 27 mg/dL  ? Creatinine, Ser 0.90 0.76 - 1.27 mg/dL  ? eGFR 94 >59 mL/min/1.73  ? BUN/Creatinine Ratio 11 10 - 24  ? Sodium 140 134 - 144 mmol/L  ? Potassium 4.5 3.5 - 5.2 mmol/L  ? Chloride 102 96 - 106 mmol/L  ? CO2 24 20 - 29 mmol/L  ? Calcium 9.5 8.6 - 10.2 mg/dL  ? Total Protein 6.8 6.0 - 8.5 g/dL  ? Albumin 4.3 3.8 - 4.8 g/dL  ? Globulin, Total 2.5 1.5 - 4.5 g/dL  ? Albumin/Globulin Ratio 1.7 1.2 - 2.2  ? Bilirubin Total 0.3 0.0 - 1.2 mg/dL  ? Alkaline Phosphatase 117 44 - 121 IU/L  ? AST 17 0 - 40 IU/L  ? ALT 11 0 - 44 IU/L  ?Hemoglobin A1c     Status: Abnormal  ? Collection Time: 06/17/21  8:37 AM  ?Result Value Ref Range  ? Hgb A1c MFr Bld 9.5 (H) 4.8 - 5.6 %  ?  Comment:          Prediabetes: 5.7 - 6.4 ?         Diabetes: >6.4 ?         Glycemic control for adults with diabetes: <7.0 ?  ? Est. average glucose Bld gHb Est-mCnc 226 mg/dL  ?Lipid panel     Status: Abnormal  ? Collection Time: 06/17/21  8:37 AM  ?Result Value Ref Range  ? Cholesterol, Total 111 100 - 199 mg/dL  ? Triglycerides 227 (H) 0 - 149 mg/dL  ? HDL 27 (L) >39 mg/dL  ? VLDL Cholesterol Cal 36 5 - 40 mg/dL  ? LDL Chol Calc (NIH) 48 0 - 99 mg/dL  ? Chol/HDL Ratio 4.1 0.0 - 5.0 ratio  ?  Comment:                                   T. Chol/HDL Ratio ?                                            Men  Women ?                              1/2 Avg.Risk  3.4    3.3 ?                                  Avg.Risk  5.0    4.4 ?                               2X Avg.Risk  9.6    7.1 ?  3X Avg.Risk  23.4   11.0 ?  ?Microalbumin / creatinine urine ratio     Status: Abnormal  ? Collection Time: 06/17/21  8:37 AM  ?Result Value Ref Range  ? Creatinine, Urine 211.4 Not Estab. mg/dL  ? Microalbumin, Urine 63.6

## 2021-08-15 NOTE — Telephone Encounter (Signed)
Patient was seen by Dr Ralene Cork today. Misty Stanley ?

## 2021-08-15 NOTE — Telephone Encounter (Signed)
-----   Message from Asencion Islam, [place] sent at 08/15/2021 11:17 AM EDT ----- ?Yes he can take that refill or get Advice from Dr. Ralene Cork when he is seen today; patient has an appt today ?Thanks ?Dr. Kathie Rhodes ?----- Message ----- ?From: Lanney Gins, PMAC ?Sent: 08/11/2021   1:08 PM EDT ?To: Asencion Islam, DPM ? ?Patient called and left a message stating that the drug store had one additional refill and the patient did not know if he needed to take or not take. Misty Stanley ? ? ?

## 2021-08-22 DIAGNOSIS — G894 Chronic pain syndrome: Secondary | ICD-10-CM | POA: Diagnosis not present

## 2021-08-22 DIAGNOSIS — M5136 Other intervertebral disc degeneration, lumbar region: Secondary | ICD-10-CM | POA: Diagnosis not present

## 2021-08-22 DIAGNOSIS — M47816 Spondylosis without myelopathy or radiculopathy, lumbar region: Secondary | ICD-10-CM | POA: Diagnosis not present

## 2021-08-22 DIAGNOSIS — M549 Dorsalgia, unspecified: Secondary | ICD-10-CM | POA: Diagnosis not present

## 2021-08-22 DIAGNOSIS — M48061 Spinal stenosis, lumbar region without neurogenic claudication: Secondary | ICD-10-CM | POA: Diagnosis not present

## 2021-08-22 DIAGNOSIS — M5137 Other intervertebral disc degeneration, lumbosacral region: Secondary | ICD-10-CM | POA: Diagnosis not present

## 2021-08-22 DIAGNOSIS — Z79891 Long term (current) use of opiate analgesic: Secondary | ICD-10-CM | POA: Diagnosis not present

## 2021-08-22 DIAGNOSIS — Z1389 Encounter for screening for other disorder: Secondary | ICD-10-CM | POA: Diagnosis not present

## 2021-08-26 ENCOUNTER — Ambulatory Visit (INDEPENDENT_AMBULATORY_CARE_PROVIDER_SITE_OTHER): Payer: PPO

## 2021-08-26 DIAGNOSIS — I442 Atrioventricular block, complete: Secondary | ICD-10-CM | POA: Diagnosis not present

## 2021-08-26 LAB — CUP PACEART REMOTE DEVICE CHECK
Battery Remaining Longevity: 156 mo
Battery Voltage: 3.19 V
Brady Statistic AP VP Percent: 1.55 %
Brady Statistic AP VS Percent: 0 %
Brady Statistic AS VP Percent: 98.44 %
Brady Statistic AS VS Percent: 0 %
Brady Statistic RA Percent Paced: 1.57 %
Brady Statistic RV Percent Paced: 100 %
Date Time Interrogation Session: 20230421052120
Implantable Lead Implant Date: 20091120
Implantable Lead Implant Date: 20091120
Implantable Lead Location: 753859
Implantable Lead Location: 753860
Implantable Lead Model: 5076
Implantable Lead Model: 5076
Implantable Pulse Generator Implant Date: 20230118
Lead Channel Impedance Value: 342 Ohm
Lead Channel Impedance Value: 456 Ohm
Lead Channel Impedance Value: 760 Ohm
Lead Channel Impedance Value: 817 Ohm
Lead Channel Pacing Threshold Amplitude: 0.75 V
Lead Channel Pacing Threshold Amplitude: 0.875 V
Lead Channel Pacing Threshold Pulse Width: 0.4 ms
Lead Channel Pacing Threshold Pulse Width: 0.4 ms
Lead Channel Sensing Intrinsic Amplitude: 3.75 mV
Lead Channel Sensing Intrinsic Amplitude: 3.75 mV
Lead Channel Sensing Intrinsic Amplitude: 6.75 mV
Lead Channel Setting Pacing Amplitude: 1.5 V
Lead Channel Setting Pacing Amplitude: 2 V
Lead Channel Setting Pacing Pulse Width: 0.4 ms
Lead Channel Setting Sensing Sensitivity: 0.9 mV

## 2021-08-29 ENCOUNTER — Encounter: Payer: Self-pay | Admitting: Cardiology

## 2021-08-29 ENCOUNTER — Ambulatory Visit: Payer: PPO | Admitting: Podiatry

## 2021-08-29 ENCOUNTER — Encounter: Payer: Self-pay | Admitting: Podiatry

## 2021-08-29 ENCOUNTER — Ambulatory Visit (INDEPENDENT_AMBULATORY_CARE_PROVIDER_SITE_OTHER): Payer: PPO | Admitting: Cardiology

## 2021-08-29 VITALS — BP 174/76 | HR 96 | Ht 70.0 in | Wt 147.0 lb

## 2021-08-29 DIAGNOSIS — I96 Gangrene, not elsewhere classified: Secondary | ICD-10-CM

## 2021-08-29 DIAGNOSIS — I739 Peripheral vascular disease, unspecified: Secondary | ICD-10-CM | POA: Diagnosis not present

## 2021-08-29 DIAGNOSIS — I442 Atrioventricular block, complete: Secondary | ICD-10-CM | POA: Diagnosis not present

## 2021-08-29 NOTE — Progress Notes (Signed)
? ?Electrophysiology Office Note ? ? ?Date:  08/29/2021  ? ?ID:  Andrew Wiggins, DOB 30-Nov-1955, MRN [id number] ? ?PCP:  Andrew Miyamoto, MD  ?Cardiologist:  Andrew Wiggins ?Primary Electrophysiologist:  Andrew Mckay Jorja Loa, MD   ? ?Chief Complaint: pacemaker ?  ?History of Present Illness: ?Andrew Wiggins is a 66 y.o. male who is being seen today for the evaluation of pacemaker at the request of Andrew Wiggins,*. Presenting today for electrophysiology evaluation. ? ?He has a history significant for complete heart block status post Medtronic dual-chamber pacemaker, hypertension, hyperlipidemia, diabetes, peripheral arterial disease.  He had a stent placed to his left superficial femoral artery 12/14/2020.  Status post pacemaker generator change 05/26/2021. ? ?Today, denies symptoms of palpitations, chest pain, shortness of breath, orthopnea, PND, lower extremity edema, claudication, dizziness, presyncope, syncope, bleeding, or neurologic sequela. The patient is tolerating medications without difficulties.  Since being seen he has done well.  He has had no issues with his pacemaker.  He has not had any issues with the device site.  He is overall happy with his control.  He is having issues with his left foot.  He states that he has been seen by a podiatrist. ? ? ?Past Medical History:  ?Diagnosis Date  ? Allergic rhinitis 09/04/2012  ? AV block   ? history of  ? 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  Hx of AV BLOCK (ICD-426.9) & CARDIAC PACEMAKER IN SITU (ICD-V45.01) - he was adm 1/10 Wiggins/ symptomatic bradycardias & 2:1 heart block requiring pacemaker- followed by DrTaylor... ~  Jan11:  Last seen by Cards & pacer reprogrammed...    ? Cardiac pacemaker in situ   ? Carotid artery disease (HCC) 07/27/2010  ? R/O PERIPHERAL VASCULAR DISEASE (ICD-443.9) - on ASA 81mg /d... exam 11/09 shows gr 1-2 sys murmur LSB, but also has prominent bruit to base of right side of his neck Wiggins/ right > left Carotid Bruit... ~  CDopplers 11/09  showed severe plaque right ICA, & min plaque on left... no signif ICA stenoses per report. ~  f/u CDopplers 12/10 showed mild irreg plaque bilat in bulbs & intimal thickening in righ  ? 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 CHRONIC OBSTRUCTIVE PULMONARY DISEASE (ICD-496) - hx recurrent bronchitic episodes in the past... smokes 1/2 - 1ppd x yrs... ~  PFT's 11/08 showed FVC= 3.85 (79%), FEV1= 2.19 (55%), %1sec= 57, mid-flows= 32% predicted:  all c/Wiggins mod airflow obstruction...  ~  CXR 12/10 showed left subclav pacer, mild hyperinflation, NAD.14/10.    ? Dermatitis, contact   ? due to poison ivy  ? Diabetes mellitus with neuropathy (HCC) 12/20/2016  ? Dyslipidemia 03/06/2012  ? Essential hypertension 04/10/2007  ?  HYPERTENSION (ICD-401.9) - prev on Micardis 80mg /d then switched to Losartan 100mg /d, but he lost his job in 2011& couldn't afford Rx;  He called 14/07/2006 2/12 Wiggins/ this news & we phoned in Lisinopril 20mg /d... ~  2DEcho 11/09 showed sl incr LVwall thickness, norm LVF Wiggins/ EF= 55-60%, no regional wall motion abn, mild DD, mild MR... ~  Myoview 12/09 showed LBBB, no perfusion defects, abn septal motion & EF  ? GERD 03/26/2008  ?  GERD (ICD-530.81) - prev mild GERD symptoms that improved after cholecystectomy on 2000... uses OTC PPI as needed... ~  he is due for routine screening colonoscopy...      ? GERD (gastroesophageal reflux disease)   ? Headache(784.0)   ? Hypertension   ? Mixed hyperlipidemia  06/19/2019  ? Peripheral vascular disease (HCC)   ? Sciatica 11/18/2019  ? Tobacco use disorder   ? ?Past Surgical History:  ?Procedure Laterality Date  ? ABDOMINAL AORTOGRAM Wiggins/LOWER EXTREMITY N/A 12/14/2020  ? Procedure: ABDOMINAL AORTOGRAM Wiggins/LOWER EXTREMITY;  Surgeon: Andrew Wiggins, Andrew W, MD;  Location: [facility];  Service: Cardiovascular;  Laterality: N/A;  ? ABDOMINAL AORTOGRAM Wiggins/LOWER EXTREMITY N/A 05/24/2021  ? Procedure: ABDOMINAL AORTOGRAM Wiggins/LOWER EXTREMITY;  Surgeon:  Andrew Wiggins, Andrew W, MD;  Location: [facility];  Service: Cardiovascular;  Laterality: N/A;  ? laproscopic cholecystectomy  2000  ? by Dr. Odie SeraHoxsworth  ? PACEMAKER INSERTION  03/2008  ? by Dr. Ladona Ridgelaylor  ? PERIPHERAL VASCULAR INTERVENTION Left 12/14/2020  ? Procedure: PERIPHERAL VASCULAR INTERVENTION;  Surgeon: Andrew Wiggins, Andrew W, MD;  Location: [facility];  Service: Cardiovascular;  Laterality: Left;  SFA  ? PERIPHERAL VASCULAR INTERVENTION Left 05/24/2021  ? Procedure: PERIPHERAL VASCULAR INTERVENTION;  Surgeon: Andrew Wiggins, Andrew W, MD;  Location: [facility];  Service: Cardiovascular;  Laterality: Left;  ? PPM GENERATOR CHANGEOUT N/A 05/25/2021  ? Procedure: PPM GENERATOR CHANGEOUT;  Surgeon: Andrew Wiggins, Andrew Mcqueary Martin, MD;  Location: [facility];  Service: Cardiovascular;  Laterality: N/A;  ? ? ? ?Current Outpatient Medications  ?Medication Sig Dispense Refill  ? amoxicillin-clavulanate (AUGMENTIN) 875-125 MG tablet Take 1 tablet by mouth 2 (two) times daily for 14 days. 28 tablet 0  ? aspirin EC 81 MG tablet Take 1 tablet (81 mg total) by mouth daily. Swallow whole. 150 tablet 2  ? atorvastatin (LIPITOR) 20 MG tablet Take 1 tablet (20 mg total) by mouth daily. 90 tablet 1  ? clopidogrel (PLAVIX) 75 MG tablet Take 1 tablet (75 mg total) by mouth daily. 90 tablet 1  ? gabapentin (NEURONTIN) 300 MG capsule Take 2 capsules (600 mg total) by mouth 3 (three) times daily. Take 2 capsules 3 times a day 270 capsule 1  ? glimepiride (AMARYL) 4 MG tablet Take 1 tablet (4 mg total) by mouth daily. 90 tablet 1  ? hydrochlorothiazide (MICROZIDE) 12.5 MG capsule Take 1 capsule (12.5 mg total) by mouth daily. 90 capsule 1  ? HYDROcodone-acetaminophen (NORCO) 10-325 MG tablet Take 1 tablet by mouth 3 (three) times daily.    ? lisinopril (ZESTRIL) 20 MG tablet TAKE 1 TABLET BY MOUTH IN THE MORNING AND AT BEDTIME 180 tablet 1  ? metFORMIN (GLUCOPHAGE-XR) 500 MG 24 hr tablet TAKE 1 TABLET BY MOUTH ONCE DAILY WITH EVENING  MEAL 90 tablet 1  ? Semaglutide (RYBELSUS) 7 MG TABS Take 7 mg by mouth daily. 90 tablet 1  ? insulin degludec (TRESIBA FLEXTOUCH) 100 UNIT/ML FlexTouch Pen Inject 10 Units into the skin daily. 6 mL 3  ? ?No current facility-administered medications for this visit.  ? ? ?Allergies:   Patient has no known allergies.  ? ?Social History:  The patient  reports that he has been smoking cigarettes. He has a 25.00 pack-year smoking history. He has never used smokeless tobacco. He reports that he does not currently use alcohol. He reports that he does not use drugs.  ? ?Family History:  The patient's family history includes Diabetes in his brother.  ? ?ROS:  Please see the history of present illness.   Otherwise, review of systems is positive for none.   All other systems are reviewed and negative.  ? ?PHYSICAL EXAM: ?VS:  BP (!) 174/76   Pulse 96   Ht 5\' 10"  (1.778 m)  Wt 147 lb (66.7 kg)   SpO2 96%   BMI 21.09 kg/m?  , BMI Body mass index is 21.09 kg/m?. ?GEN: Well nourished, well developed, in no acute distress  ?HEENT: normal  ?Neck: no JVD, carotid bruits, or masses ?Cardiac: RRR; no murmurs, rubs, or gallops,no edema  ?Respiratory:  clear to auscultation bilaterally, normal work of breathing ?GI: soft, nontender, nondistended, + BS ?MS: no deformity or atrophy  ?Skin: warm and dry, device site well healed ?Neuro:  Strength and sensation are intact ?Psych: euthymic mood, full affect ? ?EKG:  EKG is ordered today. ?Personal review of the ekg ordered shows A sense, V pace ? ?Personal review of the device interrogation today. Results in Paceart  ? ? ?Recent Labs: ?06/17/2021: ALT 11; BUN 10; Creatinine, Ser 0.90; Hemoglobin 14.7; Platelets 229; Potassium 4.5; Sodium 140  ? ? ?Lipid Panel  ?   ?Component Value Date/Time  ? CHOL 111 06/17/2021 0837  ? TRIG 227 (H) 06/17/2021 0837  ? HDL 27 (L) 06/17/2021 0837  ? CHOLHDL 4.1 06/17/2021 0837  ? CHOLHDL 5 12/20/2017 1320  ? VLDL 44.0 (H) 12/20/2017 1320  ? LDLCALC 48  06/17/2021 0837  ? LDLDIRECT 113.0 12/20/2017 1320  ? ? ? ?Wt Readings from Last 3 Encounters:  ?08/29/21 147 lb (66.7 kg)  ?06/27/21 142 lb (64.4 kg)  ?06/17/21 141 lb 6.4 oz (64.1 kg)  ?  ? ? ?Other studies Revi

## 2021-08-29 NOTE — Progress Notes (Signed)
Subjective: ?Andrew Wiggins is a 66 y.o. male patient seen in office for evaluation of gangrene at the left fourth toe.  Patient reports that he has been applying Betadine and dry dressing and has had improvement with his pain. Doing well.     Patient denies nausea vomiting fever chills or any other constitutional symptoms at this time. ? ? ?Patient Active Problem List  ? Diagnosis Date Noted  ? Gangrene of toe ([place]) 06/19/2021  ? AV block   ? Routine general medical examination at a health care facility 06/16/2020  ? Diabetes mellitus ([place]) 02/04/2020  ? Tobacco use 02/04/2020  ? Complete AV block ([place])   ? Dermatitis, contact   ? GERD (gastroesophageal reflux disease)   ? PVD (peripheral vascular disease) ([place])   ? Sciatica 11/18/2019  ? Mixed hyperlipidemia 06/19/2019  ? Diabetes mellitus with neuropathy ([place]) 12/20/2016  ? Allergic rhinitis 09/04/2012  ? Dyslipidemia 03/06/2012  ? Carotid artery disease ([place]) 07/27/2010  ? Cardiac pacemaker in situ 11/26/2008  ? 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 CIGARETTE SMOKER 03/26/2008  ? COPD (chronic obstructive pulmonary disease) with chronic bronchitis ([place]) 03/26/2008  ? GERD 03/26/2008  ? Essential hypertension 04/10/2007  ? Headache(784.0) 04/10/2007  ? ?Current Outpatient Medications on File Prior to Visit  ?Medication Sig Dispense Refill  ? amoxicillin-clavulanate (AUGMENTIN) 875-125 MG tablet Take 1 tablet by mouth 2 (two) times daily for 14 days. 28 tablet 0  ? aspirin EC 81 MG tablet Take 1 tablet (81 mg total) by mouth daily. Swallow whole. 150 tablet 2  ? atorvastatin (LIPITOR) 20 MG tablet Take 1 tablet (20 mg total) by mouth daily. 90 tablet 1  ? clopidogrel (PLAVIX) 75 MG tablet Take 1 tablet (75 mg total) by mouth daily. 90 tablet 1  ? gabapentin (NEURONTIN) 300 MG capsule Take 2 capsules (600 mg total) by mouth 3 (three) times daily. Take 2 capsules 3 times a day 270 capsule 1  ? glimepiride (AMARYL) 4 MG tablet Take 1 tablet (4 mg total) by mouth  daily. 90 tablet 1  ? hydrochlorothiazide (MICROZIDE) 12.5 MG capsule Take 1 capsule (12.5 mg total) by mouth daily. 90 capsule 1  ? HYDROcodone-acetaminophen (NORCO) 10-325 MG tablet Take 1 tablet by mouth 3 (three) times daily.    ? insulin degludec (TRESIBA FLEXTOUCH) 100 UNIT/ML FlexTouch Pen Inject 10 Units into the skin daily. 6 mL 3  ? lisinopril (ZESTRIL) 20 MG tablet TAKE 1 TABLET BY MOUTH IN THE MORNING AND AT BEDTIME 180 tablet 1  ? metFORMIN (GLUCOPHAGE-XR) 500 MG 24 hr tablet TAKE 1 TABLET BY MOUTH ONCE DAILY WITH EVENING MEAL 90 tablet 1  ? Semaglutide (RYBELSUS) 7 MG TABS Take 7 mg by mouth daily. 90 tablet 1  ? ?No current facility-administered medications on file prior to visit.  ? ?No Known Allergies ? ?Recent Results (from the past 2160 hour(s))  ?CUP PACEART INCLINIC DEVICE CHECK     Status: None  ? Collection Time: 06/09/21  2:06 PM  ?Result Value Ref Range  ? Date Time Interrogation Session [phone]   ? Pulse Insurance claims handler MERM   ? Pulse Gen Model [id number] Azure XT DR MRI   ? Pulse Gen Serial Number [id number]   ? Clinic Name [facility]   ? Implantable Pulse Generator Type Implantable Pulse Generator   ? Implantable Pulse Generator Implant Date [id number]   ? Implantable Lead Manufacturer MERM   ? Implantable Lead Model 5076 CapSureFix Novus   ? Implantable Lead  Serial Number [id number]   ? Implantable Lead Implant Date [id number]   ? Implantable Lead Location [phone]   ? Implantable Lead Manufacturer MERM   ? Implantable Lead Model 5076 CapSureFix Novus   ? Implantable Lead Serial Number [id number]   ? Implantable Lead Implant Date [id number]   ? Implantable Lead Location [id number]   ? Lead Channel Setting Sensing Sensitivity 0.9 mV  ? Lead Channel Setting Pacing Amplitude 1.5 V  ? Lead Channel Setting Pacing Pulse Width 0.4 ms  ? Lead Channel Setting Pacing Amplitude 2 V  ? Lead Channel Impedance Value 513 ohm  ? Lead Channel Impedance Value 399 ohm  ? Lead Channel Sensing Intrinsic  Amplitude 8.375 mV  ? Lead Channel Pacing Threshold Amplitude 0.75 V  ? Lead Channel Pacing Threshold Pulse Width 0.4 ms  ? Lead Channel Impedance Value 874 ohm  ? Lead Channel Impedance Value 817 ohm  ? Lead Channel Sensing Intrinsic Amplitude 6.75 mV  ? Lead Channel Pacing Threshold Amplitude 0.75 V  ? Lead Channel Pacing Threshold Pulse Width 0.4 ms  ? Battery Status OK   ? Battery Remaining Longevity 159 mo  ? Battery Voltage 3.22 V  ? Loletha Grayer Statistic RA Percent Paced 0.83 %  ? Brady Statistic RV Percent Paced 99.99 %  ? Brady Statistic AP VP Percent 0.83 %  ? Brady Statistic AS VP Percent 99.16 %  ? Brady Statistic AP VS Percent 0 %  ? Brady Statistic AS VS Percent 0.01 %  ? Eval Rhythm AS 111/VP 30 with escape   ?Comprehensive metabolic panel     Status: Abnormal  ? Collection Time: 06/17/21  8:37 AM  ?Result Value Ref Range  ? Glucose 177 (H) 70 - 99 mg/dL  ? BUN 10 8 - 27 mg/dL  ? Creatinine, Ser 0.90 0.76 - 1.27 mg/dL  ? eGFR 94 >59 mL/min/1.73  ? BUN/Creatinine Ratio 11 10 - 24  ? Sodium 140 134 - 144 mmol/L  ? Potassium 4.5 3.5 - 5.2 mmol/L  ? Chloride 102 96 - 106 mmol/L  ? CO2 24 20 - 29 mmol/L  ? Calcium 9.5 8.6 - 10.2 mg/dL  ? Total Protein 6.8 6.0 - 8.5 g/dL  ? Albumin 4.3 3.8 - 4.8 g/dL  ? Globulin, Total 2.5 1.5 - 4.5 g/dL  ? Albumin/Globulin Ratio 1.7 1.2 - 2.2  ? Bilirubin Total 0.3 0.0 - 1.2 mg/dL  ? Alkaline Phosphatase 117 44 - 121 IU/L  ? AST 17 0 - 40 IU/L  ? ALT 11 0 - 44 IU/L  ?Hemoglobin A1c     Status: Abnormal  ? Collection Time: 06/17/21  8:37 AM  ?Result Value Ref Range  ? Hgb A1c MFr Bld 9.5 (H) 4.8 - 5.6 %  ?  Comment:          Prediabetes: 5.7 - 6.4 ?         Diabetes: >6.4 ?         Glycemic control for adults with diabetes: <7.0 ?  ? Est. average glucose Bld gHb Est-mCnc 226 mg/dL  ?Lipid panel     Status: Abnormal  ? Collection Time: 06/17/21  8:37 AM  ?Result Value Ref Range  ? Cholesterol, Total 111 100 - 199 mg/dL  ? Triglycerides 227 (H) 0 - 149 mg/dL  ? HDL 27 (L) >39 mg/dL   ? VLDL Cholesterol Cal 36 5 - 40 mg/dL  ? LDL Chol Calc (NIH) 48 0 - 99 mg/dL  ? Chol/HDL Ratio  4.1 0.0 - 5.0 ratio  ?  Comment:                                   T. Chol/HDL Ratio ?                                            Men  Women ?                              1/2 Avg.Risk  3.4    3.3 ?                                  Avg.Risk  5.0    4.4 ?                               2X Avg.Risk  9.6    7.1 ?                               3X Avg.Risk 23.4   11.0 ?  ?Microalbumin / creatinine urine ratio     Status: Abnormal  ? Collection Time: 06/17/21  8:37 AM  ?Result Value Ref Range  ? Creatinine, Urine 211.4 Not Estab. mg/dL  ? Microalbumin, Urine 63.6 Not Estab. ug/mL  ? Microalb/Creat Ratio 30 (H) 0 - 29 mg/g creat  ?  Comment:                        Normal:                0 -  29 ?                       Moderately increased: 30 - 300 ?                       Severely increased:       >300 ?  ?CBC with Differential/Platelet     Status: Abnormal  ? Collection Time: 06/17/21  8:37 AM  ?Result Value Ref Range  ? WBC 11.6 (H) 3.4 - 10.8 x10E3/uL  ? RBC 4.76 4.14 - 5.80 x10E6/uL  ? Hemoglobin 14.7 13.0 - 17.7 g/dL  ? Hematocrit 43.9 37.5 - 51.0 %  ? MCV 92 79 - 97 fL  ? MCH 30.9 26.6 - 33.0 pg  ? MCHC 33.5 31.5 - 35.7 g/dL  ? RDW 13.5 11.6 - 15.4 %  ? Platelets 229 150 - 450 x10E3/uL  ? Neutrophils 63 Not Estab. %  ? Lymphs 28 Not Estab. %  ? Monocytes 8 Not Estab. %  ? Eos 1 Not Estab. %  ? Basos 0 Not Estab. %  ? Neutrophils Absolute 7.2 (H) 1.4 - 7.0 x10E3/uL  ? Lymphocytes Absolute 3.2 (H) 0.7 - 3.1 x10E3/uL  ? Monocytes Absolute 0.9 0.1 - 0.9 x10E3/uL  ? EOS (ABSOLUTE) 0.2 0.0 - 0.4 x10E3/uL  ? Basophils Absolute 0.0 0.0 - 0.2 x10E3/uL  ? Immature Granulocytes 0 Not Estab. %  ? Immature Grans (Abs)  0.0 0.0 - 0.1 x10E3/uL  ?Cardiovascular Risk Assessment     Status: None  ? Collection Time: 06/17/21  8:37 AM  ?Result Value Ref Range  ? Interpretation Note   ?  Comment: Supplemental report is available.  ?CUP PACEART  REMOTE DEVICE CHECK     Status: None  ? Collection Time: 08/26/21  5:21 AM  ?Result Value Ref Range  ? Date Time Interrogation Session [phone]   ? Pulse Insurance claims handler MERM   ? Pulse Gen

## 2021-08-29 NOTE — Patient Instructions (Signed)
Medication Instructions:  ?Your physician recommends that you continue on your current medications as directed. Please refer to the Current Medication list given to you today. ? ?*If you need a refill on your cardiac medications before your next appointment, please call your pharmacy* ? ? ?Lab Work: ?None ordered ? ? ?Testing/Procedures: ?None ordered ? ? ?Follow-Up: ?At [facility], you and your health needs are our priority.  As part of our continuing mission to provide you with exceptional heart care, we have created designated Provider Care Teams.  These Care Teams include your primary Cardiologist (physician) and Advanced Practice Providers (APPs -  Physician Assistants and Nurse Practitioners) who all work together to provide you with the care you need, when you need it. ? ?Remote monitoring is used to monitor your Pacemaker or ICD from home. This monitoring reduces the number of office visits required to check your device to one time per year. It allows [place] to keep an eye on the functioning of your device to ensure it is working properly. You are scheduled for a device check from home on 11/25/21. You may send your transmission at any time that day. If you have a wireless device, the transmission will be sent automatically. After your physician reviews your transmission, you will receive a postcard with your next transmission date. ? ?Your next appointment:   ?1 year(s) ? ?The format for your next appointment:   ?In Person ? ?Provider:   ?Loman Brooklyn, MD ? ? ?Thank you for choosing [facility]!! ? ? ?Dory Horn, RN ?([phone] ? ? ?

## 2021-09-05 ENCOUNTER — Telehealth: Payer: Self-pay

## 2021-09-05 NOTE — Progress Notes (Signed)
? ? ?  Chronic Care Management ?Pharmacy Assistant  ? ?Name: Andrew Wiggins  MRN: [id number] DOB: 11-28-1955 ? ? ?Reason for Encounter: Medication Coordination for [place]  ?  ?Recent office visits:  ?06/20/21 Reinaldo Meeker MD. Orders Only. Ordered Tresiba Flextouch 100units.  ? ?Recent consult visits:  ?08/29/21 (Podiatry) Lorenda Peck DPM. Seen for Foot Ulcer. No med changes.  ? ?08/29/21 (Cardiology) [name] MD. Seen for AV block. No med changes. ? ?08/15/21 (Podiatry) Lorenda Peck DPM. Seen for Foot Ulcer. No med changes.  ? ?07/22/21 (Podiatry) Cannon Kettle, Titorya DPM. Orders Only. Reordered Augmentin 875-125mg .  ? ?07/06/21 (Podiatry) Landis Martins DPM. Seen for Foot Ulcer. Started on Augmentin 875-125mg .  ? ?06/27/21 (Vascular Surgery) Harold Barban MD. Seen for post op. No med changes. ? ?Hospital visits:  ?None ? ?Medications: ?Outpatient Encounter Medications as of 09/05/2021  ?Medication Sig  ? aspirin EC 81 MG tablet Take 1 tablet (81 mg total) by mouth daily. Swallow whole.  ? atorvastatin (LIPITOR) 20 MG tablet Take 1 tablet (20 mg total) by mouth daily.  ? clopidogrel (PLAVIX) 75 MG tablet Take 1 tablet (75 mg total) by mouth daily.  ? gabapentin (NEURONTIN) 300 MG capsule Take 2 capsules (600 mg total) by mouth 3 (three) times daily. Take 2 capsules 3 times a day  ? glimepiride (AMARYL) 4 MG tablet Take 1 tablet (4 mg total) by mouth daily.  ? hydrochlorothiazide (MICROZIDE) 12.5 MG capsule Take 1 capsule (12.5 mg total) by mouth daily.  ? HYDROcodone-acetaminophen (NORCO) 10-325 MG tablet Take 1 tablet by mouth 3 (three) times daily.  ? insulin degludec (TRESIBA FLEXTOUCH) 100 UNIT/ML FlexTouch Pen Inject 10 Units into the skin daily.  ? lisinopril (ZESTRIL) 20 MG tablet TAKE 1 TABLET BY MOUTH IN THE MORNING AND AT BEDTIME  ? metFORMIN (GLUCOPHAGE-XR) 500 MG 24 hr tablet TAKE 1 TABLET BY MOUTH ONCE DAILY WITH EVENING MEAL  ? Semaglutide (RYBELSUS) 7 MG TABS Take 7 mg by mouth daily.  ? ?No  facility-administered encounter medications on file as of 09/05/2021.  ? ? ?Reviewed chart for medication changes ahead of medication coordination call. ? ?No hospital visits since last care coordination call/Pharmacist visit. ? ? ?BP Readings from Last 3 Encounters:  ?08/29/21 (!) 174/76  ?06/27/21 133/70  ?06/17/21 (!) 150/70  ?  ?Lab Results  ?Component Value Date  ? HGBA1C 9.5 (H) 06/17/2021  ?  ? ?Patient obtains medications through Adherence Packaging  30 Days  ? ?Patient is due for his first adherence delivery on: 09/15/21. ?Called patient and reviewed medications and coordinated delivery. ? ?This delivery to include: ?Aspirin 81mg  1 at EM ?Atorvastatin 20mg  1 at EM ?Glimepiride 4mg  1 at B ?HCTZ 12.5mg  1 at B ?Lisinopril 20mg  1 at B 1 at EM ?Metformin 500mg  1 at EM ?Rybelsus 7mg  1 at B ?Clopidogrel 75mg  1 at B ?Gabapentin 300mg  2 at B, 2 at L, 2 at EM ? ? ?Unable to reach pt to complete this call. Pharmacy will coordinate with the pt for delivery ? ?Elray Mcgregor, CMA ?Clinical Pharmacist Assistant  ?[phone]  ?

## 2021-09-13 NOTE — Progress Notes (Signed)
Remote pacemaker transmission.   

## 2021-09-19 ENCOUNTER — Ambulatory Visit: Payer: PPO | Admitting: Podiatry

## 2021-09-19 ENCOUNTER — Ambulatory Visit (HOSPITAL_COMMUNITY)
Admission: RE | Admit: 2021-09-19 | Discharge: 2021-09-19 | Disposition: A | Discharge status: Home / Self Care | Payer: PPO | Source: Ambulatory Visit | Attending: Surgery | Admitting: Surgery

## 2021-09-19 ENCOUNTER — Encounter: Payer: Self-pay | Admitting: Surgery

## 2021-09-19 ENCOUNTER — Ambulatory Visit: Payer: PPO | Admitting: Surgery

## 2021-09-19 ENCOUNTER — Encounter: Payer: Self-pay | Admitting: Podiatry

## 2021-09-19 ENCOUNTER — Ambulatory Visit (INDEPENDENT_AMBULATORY_CARE_PROVIDER_SITE_OTHER)
Admission: RE | Admit: 2021-09-19 | Discharge: 2021-09-19 | Disposition: A | Discharge status: Home / Self Care | Payer: PPO | Source: Ambulatory Visit | Attending: Surgery | Admitting: Surgery

## 2021-09-19 VITALS — BP 158/63 | HR 84 | Temp 98.3°F | Resp 20 | Ht 70.0 in | Wt 148.9 lb

## 2021-09-19 DIAGNOSIS — I739 Peripheral vascular disease, unspecified: Secondary | ICD-10-CM | POA: Diagnosis not present

## 2021-09-19 DIAGNOSIS — I70222 Atherosclerosis of native arteries of extremities with rest pain, left leg: Secondary | ICD-10-CM | POA: Diagnosis not present

## 2021-09-19 DIAGNOSIS — I96 Gangrene, not elsewhere classified: Secondary | ICD-10-CM

## 2021-09-19 NOTE — Progress Notes (Signed)
Subjective: ?CHIRON CAMPIONE is a 66 y.o. male patient seen in office for evaluation of gangrene at the left fourth toe.  Patient reports that he has been applying Betadine and dry dressing and has had improvement with his pain. Doing well.     Patient denies nausea vomiting fever chills or any other constitutional symptoms at this time. ? ? ?Patient Active Problem List  ? Diagnosis Date Noted  ? Gangrene of toe (HCC) 06/19/2021  ? AV block   ? Routine general medical examination at a health care facility 06/16/2020  ? Diabetes mellitus (HCC) 02/04/2020  ? Tobacco use 02/04/2020  ? Complete AV block (HCC)   ? Dermatitis, contact   ? GERD (gastroesophageal reflux disease)   ? PVD (peripheral vascular disease) (HCC)   ? Sciatica 11/18/2019  ? Mixed hyperlipidemia 06/19/2019  ? Diabetes mellitus with neuropathy (HCC) 12/20/2016  ? Allergic rhinitis 09/04/2012  ? Dyslipidemia 03/06/2012  ? Carotid artery disease (HCC) 07/27/2010  ? Cardiac pacemaker in situ 11/26/2008  ? 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 CIGARETTE SMOKER 03/26/2008  ? 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 GERD 03/26/2008  ? Essential hypertension 04/10/2007  ? Headache(784.0) 04/10/2007  ? ?Current Outpatient Medications on File Prior to Visit  ?Medication Sig Dispense Refill  ? aspirin EC 81 MG tablet Take 1 tablet (81 mg total) by mouth daily. Swallow whole. 150 tablet 2  ? atorvastatin (LIPITOR) 20 MG tablet Take 1 tablet (20 mg total) by mouth daily. 90 tablet 1  ? clopidogrel (PLAVIX) 75 MG tablet Take 1 tablet (75 mg total) by mouth daily. 90 tablet 1  ? gabapentin (NEURONTIN) 300 MG capsule Take 2 capsules (600 mg total) by mouth 3 (three) times daily. Take 2 capsules 3 times a day 270 capsule 1  ? glimepiride (AMARYL) 4 MG tablet Take 1 tablet (4 mg total) by mouth daily. 90 tablet 1  ? hydrochlorothiazide (MICROZIDE) 12.5 MG capsule Take 1 capsule (12.5 mg total) by mouth daily. 90  capsule 1  ? HYDROcodone-acetaminophen (NORCO) 10-325 MG tablet Take 1 tablet by mouth 3 (three) times daily.    ? insulin degludec (TRESIBA FLEXTOUCH) 100 UNIT/ML FlexTouch Pen Inject 10 Units into the skin daily. 6 mL 3  ? lisinopril (ZESTRIL) 20 MG tablet TAKE 1 TABLET BY MOUTH IN THE MORNING AND AT BEDTIME 180 tablet 1  ? metFORMIN (GLUCOPHAGE-XR) 500 MG 24 hr tablet TAKE 1 TABLET BY MOUTH ONCE DAILY WITH EVENING MEAL 90 tablet 1  ? Semaglutide (RYBELSUS) 7 MG TABS Take 7 mg by mouth daily. 90 tablet 1  ? ?No current facility-administered medications on file prior to visit.  ? ?No Known Allergies ? ?Recent Results (from the past 2160 hour(s))  ?CUP PACEART REMOTE DEVICE CHECK     Status: None  ? Collection Time: 08/26/21  5:21 AM  ?Result Value Ref Range  ? Date Time Interrogation Session [id number]   ? Pulse Oncologist MERM   ? Pulse Gen Model [id number] Azure XT DR MRI   ? Pulse Gen Serial Number [id number]   ? Clinic Name [facility]   ? Implantable Pulse Generator Type Implantable Pulse Generator   ? Implantable Pulse Generator Implant Date [id number]   ? Implantable Lead Manufacturer MERM   ? Implantable Lead Model 5076 CapSureFix Novus   ? Implantable Lead Serial Number [id number]   ? Implantable Lead Implant Date [id number]   ? Implantable Lead Location [phone]   ? Implantable  Lead Manufacturer MERM   ? Implantable Lead Model 5076 CapSureFix Novus   ? Implantable Lead Serial Number [id number]   ? Implantable Lead Implant Date [id number]   ? Implantable Lead Location [id number]   ? Lead Channel Setting Sensing Sensitivity 0.9 mV  ? Lead Channel Setting Pacing Amplitude 1.5 V  ? Lead Channel Setting Pacing Pulse Width 0.4 ms  ? Lead Channel Setting Pacing Amplitude 2 V  ? Lead Channel Impedance Value 456 ohm  ? Lead Channel Impedance Value 342 ohm  ? Lead Channel Sensing Intrinsic Amplitude 3.75 mV  ? Lead Channel Sensing Intrinsic Amplitude 3.75 mV  ? Lead Channel Pacing Threshold Amplitude 0.875 V  ?  Lead Channel Pacing Threshold Pulse Width 0.4 ms  ? Lead Channel Impedance Value 817 ohm  ? Lead Channel Impedance Value 760 ohm  ? Lead Channel Sensing Intrinsic Amplitude 6.75 mV  ? Lead Channel Pacing Threshold Amplitude 0.75 V  ? Lead Channel Pacing Threshold Pulse Width 0.4 ms  ? Battery Status OK   ? Battery Remaining Longevity 156 mo  ? Battery Voltage 3.19 V  ? Huston Foley Statistic RA Percent Paced 1.57 %  ? Brady Statistic RV Percent Paced 100 %  ? Brady Statistic AP VP Percent 1.55 %  ? Brady Statistic AS VP Percent 98.44 %  ? Brady Statistic AP VS Percent 0 %  ? Brady Statistic AS VS Percent 0 %  ? ? ?Objective: ?There were no vitals filed for this visit. ? ?General: Patient is awake, alert, oriented x 3 and in no acute distress. ? ?Dermatology: Skin is warm and dry bilateral focal localized dry gangrene to the distal tuft of the left fourth toe No erythema noted Some edema noted to proximal foot.   Nails are elongated and thickened consistent with onychomycosis. ? ?  ?Vascular: DP and PT pedal pulses nonpalpable on the left foot.  Temperature gradient appears to be within normal limits, no pedal hair growth noted, minimal/trace edema noted to the left foot. ? ?Neurologic: Protective sensation absent and subjective pins-and-needles sensation likely consistent with diabetic neuropathy. ? ?Musculosketal: There is no reproducible pain to palpation to the ulcerated/gangrenous area. ? ?No results for input(s): GRAMSTAIN, LABORGA in the last 8760 hours. ? ?Assessment and Plan:  ?Problem List Items Addressed This Visit   ?None ? ? ? ?-Examined patient and discussed the progression of the wound and treatment alternatives. ?-Previous Xrays reviewed ?-At this time due to patient's poor vascular status we will continue to monitor gangrene and let the toe continue to auto amputate  Applied Betadine and dry dressing to the left fourth toe and advised patient to do the same daily. ?-Continue with surgical shoe ?- Advised  patient to go to the ER or return to office if the wound worsens or if constitutional symptoms are present. ?-Patient to return to office in  3 weeks for follow up care and evaluation or sooner if problems arise. ? ?Louann Sjogren , DPM ?

## 2021-09-19 NOTE — Progress Notes (Signed)
? ?[facility] [place] ? ?Patient name: RIQUELME MANVILLE MRN: [id number] DOB: 1955-08-13 Sex: male ? ? ?REASON FOR VISIT:  ? ? ?Follow up ? ?HISOTRY OF PRESENT ILLNESS:  ? ? ?DARCEL KARWOWSKI is a 66 y.o. male who has undergone the following: ? ?August 2022: Left superficial femoral artery stenting for a toe ulcer ?January 2023, left S[facility] for rest pain ? ?He is currently seeing podiatry in [place].  He is waiting for autoamputation ? ?Patient is a current smoker.  He has COPD.  He takes a statin for hypercholesterolemia.  He is medically managed for hypertension.  He has type 2 diabetes complicated by neuropathy. ? ? ? ? ?PAST MEDICAL HISTORY:  ? ?Past Medical History:  ?Diagnosis Date  ? Allergic rhinitis 09/04/2012  ? AV block   ? history of  ? 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  Hx of AV BLOCK (ICD-426.9) & CARDIAC PACEMAKER IN SITU (ICD-V45.01) - he was adm 1/10 w/ symptomatic bradycardias & 2:1 heart block requiring pacemaker- followed by DrTaylor... ~  Jan11:  Last seen by Cards & pacer reprogrammed...    ? Cardiac pacemaker in situ   ? Carotid artery disease ([place]) 07/27/2010  ? R/O PERIPHERAL VASCULAR DISEASE (ICD-443.9) - on ASA 81mg /d... exam 11/09 shows gr 1-2 sys murmur LSB, but also has prominent bruit to base of right side of his neck w/ right > left Carotid Bruit... ~  CDopplers 11/09 showed severe plaque right ICA, & min plaque on left... no signif ICA stenoses per report. ~  f/u CDopplers 12/10 showed mild irreg plaque bilat in bulbs & intimal thickening in righ  ? COPD (chronic obstructive pulmonary disease) with chronic bronchitis ([place]) 03/26/2008  ? CHRONIC OBSTRUCTIVE PULMONARY DISEASE (ICD-496) - hx recurrent bronchitic episodes in the past... smokes 1/2 - 1ppd x yrs... ~  PFT's 11/08 showed FVC= 3.85 (79%), FEV1= 2.19 (55%), %1sec= 57, mid-flows= 32% predicted:  all c/w mod airflow obstruction...  ~  CXR 12/10 showed left subclav pacer,  mild hyperinflation, NAD.[name].    ? Dermatitis, contact   ? due to poison ivy  ? Diabetes mellitus with neuropathy ([place]) 12/20/2016  ? Dyslipidemia 03/06/2012  ? Essential hypertension 04/10/2007  ?  HYPERTENSION (ICD-401.9) - prev on Micardis 80mg /d then switched to Losartan 100mg /d, but he lost his job in 2011& couldn't afford Rx;  He called [place] 2/12 w/ this news & we phoned in Lisinopril 20mg /d... ~  2DEcho 11/09 showed sl incr LVwall thickness, norm LVF w/ EF= 55-60%, no regional wall motion abn, mild DD, mild MR... ~  [place] 12/09 showed LBBB, no perfusion defects, abn septal motion & EF  ? GERD 03/26/2008  ?  GERD (ICD-530.81) - prev mild GERD symptoms that improved after cholecystectomy on 2000... uses OTC PPI as needed... ~  he is due for routine screening colonoscopy...      ? GERD (gastroesophageal reflux disease)   ? Headache(784.0)   ? Hypertension   ? Mixed hyperlipidemia 06/19/2019  ? Peripheral vascular disease ([place])   ? Sciatica 11/18/2019  ? Tobacco use disorder   ? ? ? ?FAMILY HISTORY:  ? ?Family History  ?Problem Relation Age of Onset  ? Diabetes Brother   ? ? ?SOCIAL HISTORY:  ? ?Social History  ? ?Tobacco Use  ? Smoking status: Every Day  ?  Packs/day: 1.00  ?  Years: 25.00  ?  Pack years: 25.00  ?  Types: Cigarettes  ?  Passive exposure:  Never  ? Smokeless tobacco: Never  ?Substance Use Topics  ? Alcohol use: Not Currently  ?  Alcohol/week: 0.0 standard drinks  ? ? ? ?ALLERGIES:  ? ?No Known Allergies ? ? ?CURRENT MEDICATIONS:  ? ?Current Outpatient Medications  ?Medication Sig Dispense Refill  ? aspirin EC 81 MG tablet Take 1 tablet (81 mg total) by mouth daily. Swallow whole. 150 tablet 2  ? atorvastatin (LIPITOR) 20 MG tablet Take 1 tablet (20 mg total) by mouth daily. 90 tablet 1  ? clopidogrel (PLAVIX) 75 MG tablet Take 1 tablet (75 mg total) by mouth daily. 90 tablet 1  ? gabapentin (NEURONTIN) 300 MG capsule Take 2 capsules (600 mg total) by mouth 3 (three) times daily. Take 2 capsules 3 times  a day 270 capsule 1  ? glimepiride (AMARYL) 4 MG tablet Take 1 tablet (4 mg total) by mouth daily. 90 tablet 1  ? HYDROcodone-acetaminophen (NORCO) 10-325 MG tablet Take 1 tablet by mouth 3 (three) times daily.    ? insulin degludec (TRESIBA FLEXTOUCH) 100 UNIT/ML FlexTouch Pen Inject 10 Units into the skin daily. 6 mL 3  ? lisinopril (ZESTRIL) 20 MG tablet TAKE 1 TABLET BY MOUTH IN THE MORNING AND AT BEDTIME 180 tablet 1  ? metFORMIN (GLUCOPHAGE-XR) 500 MG 24 hr tablet TAKE 1 TABLET BY MOUTH ONCE DAILY WITH EVENING MEAL 90 tablet 1  ? Semaglutide (RYBELSUS) 7 MG TABS Take 7 mg by mouth daily. 90 tablet 1  ? hydrochlorothiazide (MICROZIDE) 12.5 MG capsule Take 1 capsule (12.5 mg total) by mouth daily. 90 capsule 1  ? ?No current facility-administered medications for this visit.  ? ? ?REVIEW OF SYSTEMS:  ? ?[X]  denotes positive finding, [ ]  denotes negative finding ?Cardiac  Comments:  ?Chest pain or chest pressure:    ?Shortness of breath upon exertion:    ?Short of breath when lying flat:    ?Irregular heart rhythm:    ?    ?Vascular    ?Pain in calf, thigh, or hip brought on by ambulation:    ?Pain in feet at night that wakes you up from your sleep:     ?Blood clot in your veins:    ?Leg swelling:     ?    ?Pulmonary    ?Oxygen at home:    ?Productive cough:     ?Wheezing:     ?    ?Neurologic    ?Sudden weakness in arms or legs:     ?Sudden numbness in arms or legs:     ?Sudden onset of difficulty speaking or slurred speech:    ?Temporary loss of vision in one eye:     ?Problems with dizziness:     ?    ?Gastrointestinal    ?Blood in stool:     ?Vomited blood:     ?    ?Genitourinary    ?Burning when urinating:     ?Blood in urine:    ?    ?Psychiatric    ?Major depression:     ?    ?Hematologic    ?Bleeding problems:    ?Problems with blood clotting too easily:    ?    ?Skin    ?Rashes or ulcers:    ?    ?Constitutional    ?Fever or chills:    ? ? ?PHYSICAL EXAM:  ? ?Vitals:  ? 09/19/21 1521  ?BP: (!) 158/63   ?Pulse: 84  ?Resp: 20  ?Temp: 98.3 ?F (36.8 ?C)  ?  TempSrc: Temporal  ?SpO2: 94%  ?Weight: 148 lb 14.4 oz (67.5 kg)  ?Height: 5\' 10"  (1.778 m)  ? ? ?GENERAL: The patient is a well-nourished male, in no acute distress. The vital signs are documented above. ?CARDIAC: There is a regular rate and rhythm.  ?VASCULAR: Nonpalpable pedal pulses ?PULMONARY: Non-labored respirations ?ABDOMEN: Soft and non-tender with normal pitched bowel sounds.  ?MUSCULOSKELETAL: There are no major deformities or cyanosis. ?NEUROLOGIC: No focal weakness or paresthesias are detected. ?SKIN: Left fourth toe with dry gangrene ?PSYCHIATRIC: The patient has a normal affect. ? ?STUDIES:  ? ?I have reviewed the following studies: ?+-------+-----------+-----------+------------+------------+  ?ABI/TBIToday's ABIToday's TBIPrevious ABIPrevious TBI  ?+-------+-----------+-----------+------------+------------+  ?Right  0.73       0.36       0.82        0.36          ?+-------+-----------+-----------+------------+------------+  ?Left   0.54       0.33       0.63        0.36          ?+-------+-----------+-----------+------------+------------+  ? ?Right toe pressure= 56 ?Left toe pressure=52 ? ?Left: Patent distal superficial femoral artery stent.  ?Hemodynamically significant stenosis, 75-99%, involving the common  ?iliac/external iliac artery (difficult to delineate between distal common  ?iliac and proximal external iliac due to bowel gas).  ? ?Proximal SFA stenosis 50-74%.  ?MEDICAL ISSUES:  ? ?Left fourth toe dry gangrene: He is being followed by podiatry for this.  Autoamputation is the current plan.  He has small vessels and so a percutaneous intervention is likely going to have a recurrence.  He does appear to have stenosis again today.  I feel that is essentially similar to his prior study and so I am going to reevaluate him in 3 months.  At that time, if the wound has not completely taken care of itself, I will proceed with  repeat angiography. ? ? ? ?Annamarie Major, IV, MD, FACS ?[facility] [place] ?Tel [phone] ?Pager [phone]  ?

## 2021-09-27 ENCOUNTER — Other Ambulatory Visit: Payer: Self-pay | Admitting: *Deleted

## 2021-09-27 DIAGNOSIS — I70222 Atherosclerosis of native arteries of extremities with rest pain, left leg: Secondary | ICD-10-CM

## 2021-09-27 DIAGNOSIS — I739 Peripheral vascular disease, unspecified: Secondary | ICD-10-CM

## 2021-09-28 DIAGNOSIS — M5136 Other intervertebral disc degeneration, lumbar region: Secondary | ICD-10-CM | POA: Diagnosis not present

## 2021-09-28 DIAGNOSIS — M5137 Other intervertebral disc degeneration, lumbosacral region: Secondary | ICD-10-CM | POA: Diagnosis not present

## 2021-09-28 DIAGNOSIS — Z1389 Encounter for screening for other disorder: Secondary | ICD-10-CM | POA: Diagnosis not present

## 2021-09-28 DIAGNOSIS — Z79891 Long term (current) use of opiate analgesic: Secondary | ICD-10-CM | POA: Diagnosis not present

## 2021-09-28 DIAGNOSIS — M47816 Spondylosis without myelopathy or radiculopathy, lumbar region: Secondary | ICD-10-CM | POA: Diagnosis not present

## 2021-09-28 DIAGNOSIS — G894 Chronic pain syndrome: Secondary | ICD-10-CM | POA: Diagnosis not present

## 2021-09-28 DIAGNOSIS — M549 Dorsalgia, unspecified: Secondary | ICD-10-CM | POA: Diagnosis not present

## 2021-09-28 DIAGNOSIS — M48061 Spinal stenosis, lumbar region without neurogenic claudication: Secondary | ICD-10-CM | POA: Diagnosis not present

## 2021-10-05 ENCOUNTER — Telehealth: Payer: Self-pay

## 2021-10-05 NOTE — Progress Notes (Signed)
    Chronic Care Management Pharmacy Assistant   Name: Andrew Wiggins  MRN: [id number] DOB: 1955-06-20   Reason for Encounter: Medication Coordination for [place]    Recent office visits:  None  Recent consult visits:  09/19/21 (Vascular Surg) Harold Barban MD. Seen for Limb Ischemia. No med changes.   09/19/21 (Podiatry) Lorenda Peck DPM. Seen for Gangrene of toe. No med changes.   Hospital visits:  None  Medications: Outpatient Encounter Medications as of 10/05/2021  Medication Sig   aspirin EC 81 MG tablet Take 1 tablet (81 mg total) by mouth daily. Swallow whole.   atorvastatin (LIPITOR) 20 MG tablet Take 1 tablet (20 mg total) by mouth daily.   clopidogrel (PLAVIX) 75 MG tablet Take 1 tablet (75 mg total) by mouth daily.   gabapentin (NEURONTIN) 300 MG capsule Take 2 capsules (600 mg total) by mouth 3 (three) times daily. Take 2 capsules 3 times a day   glimepiride (AMARYL) 4 MG tablet Take 1 tablet (4 mg total) by mouth daily.   hydrochlorothiazide (MICROZIDE) 12.5 MG capsule Take 1 capsule (12.5 mg total) by mouth daily.   HYDROcodone-acetaminophen (NORCO) 10-325 MG tablet Take 1 tablet by mouth 3 (three) times daily.   insulin degludec (TRESIBA FLEXTOUCH) 100 UNIT/ML FlexTouch Pen Inject 10 Units into the skin daily.   lisinopril (ZESTRIL) 20 MG tablet TAKE 1 TABLET BY MOUTH IN THE MORNING AND AT BEDTIME   metFORMIN (GLUCOPHAGE-XR) 500 MG 24 hr tablet TAKE 1 TABLET BY MOUTH ONCE DAILY WITH EVENING MEAL   Semaglutide (RYBELSUS) 7 MG TABS Take 7 mg by mouth daily.   No facility-administered encounter medications on file as of 10/05/2021.    Reviewed chart for medication changes ahead of medication coordination call.  No OVs, or hospital visits since last care coordination call/Pharmacist visit.   No medication changes indicated OR if recent visit, treatment plan here.  BP Readings from Last 3 Encounters:  09/19/21 (!) 158/63  08/29/21 (!) 174/76  06/27/21 133/70     Lab Results  Component Value Date   HGBA1C 9.5 (H) 06/17/2021     Patient obtains medications through Adherence Packaging  30 Days   Last adherence delivery included:  Aspirin 81mg  1 at EM Atorvastatin 20mg  1 at EM Glimepiride 4mg  1 at B HCTZ 12.5mg  1 at B Lisinopril 20mg  1 at B 1 at EM Metformin 500mg  1 at EM Rybelsus 7mg  1 at B Clopidogrel 75mg  1 at B Gabapentin 300mg  2 at B, 2 at L, 2 at EM   Patient declined (meds) last month  Unable to get a hold of pt  Patient is due for next adherence delivery on: 10/17/21. Called patient and reviewed medications and coordinated delivery.  This delivery to include: Aspirin 81mg  1 at EM Atorvastatin 20mg  1 at EM HCTZ 12.5mg  1 at B Lisinopril 20mg  1 at B 1 at EM Metformin 500mg  1 at EM Rybelsus 7mg  1 at B Clopidogrel 75mg  1 at B Gabapentin 300mg  2 at B, 2 at L, 2 at EM  Patient needs refills - Request Sent  Gabapentin 300mg    Unable to get in touch with pt to complete this call. Pharmacy will coordinate delivery   Elray Mcgregor, [place] Pharmacist Assistant  [phone]

## 2021-10-10 ENCOUNTER — Other Ambulatory Visit: Payer: Self-pay

## 2021-10-10 ENCOUNTER — Encounter: Payer: Self-pay | Admitting: Podiatry

## 2021-10-10 ENCOUNTER — Ambulatory Visit: Payer: PPO | Admitting: Podiatry

## 2021-10-10 DIAGNOSIS — I739 Peripheral vascular disease, unspecified: Secondary | ICD-10-CM

## 2021-10-10 DIAGNOSIS — I70222 Atherosclerosis of native arteries of extremities with rest pain, left leg: Secondary | ICD-10-CM

## 2021-10-10 DIAGNOSIS — E1169 Type 2 diabetes mellitus with other specified complication: Secondary | ICD-10-CM | POA: Diagnosis not present

## 2021-10-10 DIAGNOSIS — I96 Gangrene, not elsewhere classified: Secondary | ICD-10-CM

## 2021-10-10 DIAGNOSIS — E0840 Diabetes mellitus due to underlying condition with diabetic neuropathy, unspecified: Secondary | ICD-10-CM

## 2021-10-10 MED ORDER — GABAPENTIN 300 MG PO CAPS: 600.0000 mg | ORAL_CAPSULE | Freq: Three times a day (TID) | ORAL | 1 refills | Status: DC

## 2021-10-10 NOTE — Progress Notes (Signed)
Subjective: Andrew Wiggins is a 66 y.o. male patient seen in office for evaluation of gangrene at the left fourth toe.  Patient reports that he has been applying Betadine and dry dressing and has had improvement with his pain. Doing well. Has been in regular shoes as well. No constitutional symptoms at this time.   Patient Active Problem List   Diagnosis Date Noted   Gangrene of toe (HCC) 06/19/2021   AV block    Routine general medical examination at a health care facility 06/16/2020   Diabetes mellitus (HCC) 02/04/2020   Tobacco use 02/04/2020   Complete AV block (HCC)    Dermatitis, contact    GERD (gastroesophageal reflux disease)    PVD (peripheral vascular disease) (HCC)    Sciatica 11/18/2019   Mixed hyperlipidemia 06/19/2019   Diabetes mellitus with neuropathy (HCC) 12/20/2016   Allergic rhinitis 09/04/2012   Dyslipidemia 03/06/2012   Carotid artery disease (HCC) 07/27/2010   Cardiac pacemaker in situ 11/26/2008   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 Swallow whole. 150 tablet 2   atorvastatin (LIPITOR) 20 MG tablet Take 1 tablet (20 mg total) by mouth daily. 90 tablet 1   clopidogrel (PLAVIX) 75 MG tablet Take 1 tablet (75 mg total) by mouth daily. 90 tablet 1   gabapentin (NEURONTIN) 300 MG capsule Take 2 capsules (600 mg total) by mouth 3 (three) times daily. Take 2 capsules 3 times a day 270 capsule 1   glimepiride (AMARYL) 4 MG tablet Take 1 tablet (4 mg total) by mouth daily. 90 tablet 1   hydrochlorothiazide (MICROZIDE) 12.5 MG capsule Take 1 capsule (12.5 mg total) by mouth daily. 90 capsule 1    HYDROcodone-acetaminophen (NORCO) 10-325 MG tablet Take 1 tablet by mouth 3 (three) times daily.     insulin degludec (TRESIBA FLEXTOUCH) 100 UNIT/ML FlexTouch Pen Inject 10 Units into the skin daily. 6 mL 3   lisinopril (ZESTRIL) 20 MG tablet TAKE 1 TABLET BY MOUTH IN THE MORNING AND AT BEDTIME 180 tablet 1   metFORMIN (GLUCOPHAGE-XR) 500 MG 24 hr tablet TAKE 1 TABLET BY MOUTH ONCE DAILY WITH EVENING MEAL 90 tablet 1   Semaglutide (RYBELSUS) 7 MG TABS Take 7 mg by mouth daily. 90 tablet 1   No current facility-administered medications on file prior to visit.   No Known Allergies  Recent Results (from the past 2160 hour(s))  CUP PACEART REMOTE DEVICE CHECK     Status: None   Collection Time: 08/26/21  5:21 AM  Result Value Ref Range   Date Time Interrogation Session [id number]    Pulse Generator Manufacturer MERM    Pulse Gen Model [id number] Azure XT DR MRI    Pulse Gen Serial Number [id number]    Clinic Name [facility]    Implantable Pulse Generator Type Implantable Pulse Generator    Implantable Pulse Generator Implant Date [id number]    Implantable Lead Manufacturer [facility]    Implantable Lead Model 5076 CapSureFix Novus    Implantable Lead Serial Number [id number]    Implantable Lead Implant Date [id number]    Implantable Lead Location [id number]    Implantable Lead Manufacturer MERM  Implantable Lead Model 5076 CapSureFix Novus    Implantable Lead Serial Number [id number]    Implantable Lead Implant Date [id number]    Implantable Lead Location [phone]    Lead Channel Setting Sensing Sensitivity 0.9 mV   Lead Channel Setting Pacing Amplitude 1.5 V   Lead Channel Setting Pacing Pulse Width 0.4 ms   Lead Channel Setting Pacing Amplitude 2 V   Lead Channel Impedance Value 456 ohm   Lead Channel Impedance Value 342 ohm   Lead Channel Sensing Intrinsic Amplitude 3.75 mV   Lead Channel Sensing Intrinsic Amplitude 3.75 mV   Lead Channel Pacing Threshold Amplitude 0.875 V   Lead Channel  Pacing Threshold Pulse Width 0.4 ms   Lead Channel Impedance Value 817 ohm   Lead Channel Impedance Value 760 ohm   Lead Channel Sensing Intrinsic Amplitude 6.75 mV   Lead Channel Pacing Threshold Amplitude 0.75 V   Lead Channel Pacing Threshold Pulse Width 0.4 ms   Battery Status OK    Battery Remaining Longevity 156 mo   Battery Voltage 3.19 V   Brady Statistic RA Percent Paced 1.57 %   Brady Statistic RV Percent Paced 100 %   Brady Statistic AP VP Percent 1.55 %   Brady Statistic AS VP Percent 98.44 %   Brady Statistic AP VS Percent 0 %   Brady Statistic AS VS Percent 0 %    Objective: There were no vitals filed for this visit.  General: Patient is awake, alert, oriented x 3 and in no acute distress.  Dermatology: Skin is warm and dry bilateral focal localized dry gangrene to the distal tuft of the left fourth toe. Was able to remove distal necrosis and underlying toe with healty clean skin margins some small areas of bleeding but no open wounds noted. No erythema noted Some edema noted to proximal foot.   Nails are elongated and thickened consistent with onychomycosis.    Vascular: DP and PT pedal pulses nonpalpable on the left foot.  Temperature gradient appears to be within normal limits, no pedal hair growth noted, minimal/trace edema noted to the left foot.  Neurologic: Protective sensation absent and subjective pins-and-needles sensation likely consistent with diabetic neuropathy.  Musculosketal: There is no reproducible pain to palpation to the ulcerated/gangrenous area.  No results for input(s): GRAMSTAIN, LABORGA in the last 8760 hours.  Assessment and Plan:  Problem List Items Addressed This Visit   None    -Examined patient and discussed the progression of the wound and treatment alternatives. -Previous Xrays reviewed -At this time was able to remove distal necrosis toe without incident and underlying skin appears healthy.  -Continue with surgical shoe and  betadine for another week.  - Advised patient to go to the ER or return to office if the area worsens or if constitutional symptoms are present. -Patient to return to office in  2 weeks for follow up care and evaluation or sooner if problems arise.  Louann Sjogren , DPM

## 2021-10-13 ENCOUNTER — Telehealth: Payer: Self-pay

## 2021-10-13 NOTE — Chronic Care Management (AMB) (Cosign Needed Addendum)
Chronic Care Management Pharmacy Assistant   Name: Andrew Wiggins  MRN: [id number] DOB: [birth year]   Reason for Encounter: Medication Review/ Medication Coordination  Patient called and left a message stating he would not be able to continue with [facility] program due to cost with [facility], he stated he was wasn't expecting the charge for his medications to be so high and had to get reimbursement back from insurance company and does not have enough funds from retirement to wait on a return. Patient would like all of his medication to go back to [facility].  Sent request to [facility] to hold/cancel next delivery and to transfer all of his prescriptions to [facility].  Called patient to inform, no answer, left message that prescriptions will be transferred over and to contact me if he has any questions.   Called patient again to see if we can deliver medication without Rybelsus and work on getting him patient assistance for this medication. Per [facility] patient is in a coverage gap and that is why his Rybelsus was at a $228.44 copay. They can still deliver medications and leave Rybelsus out. No answer, left message to return call.  Patient returned my call, he was a little confused on what the insurance person (he didn't know who it was) said she was going to get his money reimbursed back to him and that the medications was never billed. I explained to the patient that insurance was billed and his prescription plan was only going to cover $635 of medication and he would have to cover rest due to coverage gap but we will hold on delivering medication and I will work on patient assistance for him on this medication and request samples from Dr Lamar Sprinkles office to help continue Rybelsus. Patient should meet eligibility due to Social security income of 28,152 per year, 1 person household. Reviewed with patient on medications that will be delivered and total cost  of  $31. Patient was ok with cost and agreed to delivery on 10/17/2021. Inquired how many glimepiride he had on hand, still not in packs due to having some pills left, patient counted pills and has 52 tablets left. Patient loves his packed pills and happy with [facility] and delivery. He will continue with [facility]. Patient aware I will contact him tomorrow to make sure Rybelsus gets put back and reversed with insurance and not charged to him. Will follow up with samples and patient assistance application.    Medications: Outpatient Encounter Medications as of 10/13/2021  Medication Sig   aspirin EC 81 MG tablet Take 1 tablet (81 mg total) by mouth daily. Swallow whole.   atorvastatin (LIPITOR) 20 MG tablet Take 1 tablet (20 mg total) by mouth daily.   clopidogrel (PLAVIX) 75 MG tablet Take 1 tablet (75 mg total) by mouth daily.   gabapentin (NEURONTIN) 300 MG capsule Take 2 capsules (600 mg total) by mouth 3 (three) times daily. Take 2 capsules 3 times a day   glimepiride (AMARYL) 4 MG tablet Take 1 tablet (4 mg total) by mouth daily.   hydrochlorothiazide (MICROZIDE) 12.5 MG capsule Take 1 capsule (12.5 mg total) by mouth daily.   HYDROcodone-acetaminophen (NORCO) 10-325 MG tablet Take 1 tablet by mouth 3 (three) times daily.   insulin degludec (TRESIBA FLEXTOUCH) 100 UNIT/ML FlexTouch Pen Inject 10 Units into the skin daily.   lisinopril (ZESTRIL) 20 MG tablet TAKE 1 TABLET BY MOUTH IN THE MORNING AND AT BEDTIME  metFORMIN (GLUCOPHAGE-XR) 500 MG 24 hr tablet TAKE 1 TABLET BY MOUTH ONCE DAILY WITH EVENING MEAL   Semaglutide (RYBELSUS) 7 MG TABS Take 7 mg by mouth daily.   No facility-administered encounter medications on file as of 10/13/2021.    Billee Cashing, CMA Clinical Pharmacist Assistant [phone]

## 2021-10-17 ENCOUNTER — Telehealth: Payer: Self-pay

## 2021-10-17 NOTE — Chronic Care Management (AMB) (Signed)
Received notification that Dr Marina Goodell office has Rybelsus samples available for patient, called patient no answer, left message that he can go by Dr Lamar Sprinkles office to pick up samples and this should help until patient assistance application has been completed.   Billee Cashing, CMA Clinical Pharmacist Assistant [phone]

## 2021-10-24 ENCOUNTER — Encounter: Payer: Self-pay | Admitting: Podiatry

## 2021-10-24 ENCOUNTER — Ambulatory Visit: Payer: PPO | Admitting: Podiatry

## 2021-10-24 DIAGNOSIS — B351 Tinea unguium: Secondary | ICD-10-CM

## 2021-10-24 DIAGNOSIS — Z899 Acquired absence of limb, unspecified: Secondary | ICD-10-CM

## 2021-10-24 DIAGNOSIS — I739 Peripheral vascular disease, unspecified: Secondary | ICD-10-CM

## 2021-10-24 DIAGNOSIS — E1169 Type 2 diabetes mellitus with other specified complication: Secondary | ICD-10-CM

## 2021-10-24 DIAGNOSIS — M79674 Pain in right toe(s): Secondary | ICD-10-CM

## 2021-10-24 DIAGNOSIS — M79675 Pain in left toe(s): Secondary | ICD-10-CM

## 2021-10-24 NOTE — Progress Notes (Signed)
Subjective: Andrew Wiggins is a 66 y.o. male patient seen in office for evaluation of gangrene at the left fourth toe. Relates area is still doing well.  Has been in regular shoes as well. No constitutional symptoms at this time. Requesting to have nails trimmed today.   PCP Suzanne Boron MD     Patient Active Problem List   Diagnosis Date Noted   Gangrene of toe (HCC) 06/19/2021   AV block    Routine general medical examination at a health care facility 06/16/2020   Diabetes mellitus (HCC) 02/04/2020   Tobacco use 02/04/2020   Complete AV block (HCC)    Dermatitis, contact    GERD (gastroesophageal reflux disease)    PVD (peripheral vascular disease) (HCC)    Sciatica 11/18/2019   Mixed hyperlipidemia 06/19/2019   Diabetes mellitus with neuropathy (HCC) 12/20/2016   Allergic rhinitis 09/04/2012   Dyslipidemia 03/06/2012   Carotid artery disease (HCC) 07/27/2010   Cardiac pacemaker in situ 11/26/2008   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 Swallow whole. 150 tablet 2   atorvastatin (LIPITOR) 20 MG tablet Take 1 tablet (20 mg total) by mouth daily. 90 tablet 1   clopidogrel (PLAVIX) 75 MG tablet Take 1 tablet (75 mg total) by mouth daily. 90 tablet 1   gabapentin (NEURONTIN) 300 MG capsule Take 2 capsules (600 mg total) by mouth 3 (three) times daily. Take 2 capsules 3 times a day 270 capsule 1   glimepiride (AMARYL) 4 MG tablet Take 1 tablet (4 mg total) by mouth daily. 90 tablet 1   hydrochlorothiazide (MICROZIDE) 12.5 MG capsule Take 1 capsule (12.5 mg total) by mouth daily. 90 capsule 1   HYDROcodone-acetaminophen (NORCO)  10-325 MG tablet Take 1 tablet by mouth 3 (three) times daily.     insulin degludec (TRESIBA FLEXTOUCH) 100 UNIT/ML FlexTouch Pen Inject 10 Units into the skin daily. 6 mL 3   lisinopril (ZESTRIL) 20 MG tablet TAKE 1 TABLET BY MOUTH IN THE MORNING AND AT BEDTIME 180 tablet 1   metFORMIN (GLUCOPHAGE-XR) 500 MG 24 hr tablet TAKE 1 TABLET BY MOUTH ONCE DAILY WITH EVENING MEAL 90 tablet 1   Semaglutide (RYBELSUS) 7 MG TABS Take 7 mg by mouth daily. 90 tablet 1   No current facility-administered medications on file prior to visit.   No Known Allergies  Recent Results (from the past 2160 hour(s))  CUP PACEART REMOTE DEVICE CHECK     Status: None   Collection Time: 08/26/21  5:21 AM  Result Value Ref Range   Date Time Interrogation Session [id number]    Pulse Generator Manufacturer MERM    Pulse Gen Model [id number] Azure XT DR MRI    Pulse Gen Serial Number [id number]    Clinic Name [facility]    Implantable Pulse Generator Type Implantable Pulse Generator    Implantable Pulse Generator Implant Date 71245809    Implantable Lead Manufacturer [facility]    Implantable Lead Model 5076 CapSureFix Novus    Implantable Lead Serial Number [id number]    Implantable Lead Implant Date 98338250    Implantable Lead Location [id number]    Implantable Lead Manufacturer MERM  Implantable Lead Model 5076 CapSureFix Novus    Implantable Lead Serial Number [id number]    Implantable Lead Implant Date 24097353    Implantable Lead Location [phone]    Lead Channel Setting Sensing Sensitivity 0.9 mV   Lead Channel Setting Pacing Amplitude 1.5 V   Lead Channel Setting Pacing Pulse Width 0.4 ms   Lead Channel Setting Pacing Amplitude 2 V   Lead Channel Impedance Value 456 ohm   Lead Channel Impedance Value 342 ohm   Lead Channel Sensing Intrinsic Amplitude 3.75 mV   Lead Channel Sensing Intrinsic Amplitude 3.75 mV   Lead Channel Pacing Threshold Amplitude 0.875 V   Lead Channel Pacing Threshold Pulse Width 0.4  ms   Lead Channel Impedance Value 817 ohm   Lead Channel Impedance Value 760 ohm   Lead Channel Sensing Intrinsic Amplitude 6.75 mV   Lead Channel Pacing Threshold Amplitude 0.75 V   Lead Channel Pacing Threshold Pulse Width 0.4 ms   Battery Status OK    Battery Remaining Longevity 156 mo   Battery Voltage 3.19 V   Brady Statistic RA Percent Paced 1.57 %   Brady Statistic RV Percent Paced 100 %   Brady Statistic AP VP Percent 1.55 %   Brady Statistic AS VP Percent 98.44 %   Brady Statistic AP VS Percent 0 %   Brady Statistic AS VS Percent 0 %    Objective: There were no vitals filed for this visit.  General: Patient is awake, alert, oriented x 3 and in no acute distress.  Dermatology: Fourth remaining digit appears well healed.No erythema noted Some edema noted to proximal foot.   Nails are elongated and thickened consistent with onychomycosis.    Vascular: DP and PT pedal pulses nonpalpable on the left foot.  Temperature gradient appears to be within normal limits, no pedal hair growth noted, minimal/trace edema noted to the left foot.  Neurologic: Protective sensation absent and subjective pins-and-needles sensation likely consistent with diabetic neuropathy.  Musculosketal: There is no reproducible pain to palpation to the ulcerated/gangrenous area.  No results for input(s): "GRAMSTAIN", "LABORGA" in the last 8760 hours.  Assessment and Plan:  Problem List Items Addressed This Visit       Endocrine   Diabetes mellitus (HCC)   Other Visit Diagnoses     PAD (peripheral artery disease) (HCC)    -  Primary   History of amputation       Pain due to onychomycosis of toenails of both feet            -Examined patient and discussed the progression of the wound and treatment alternatives. -Previous Xrays reviewed -Toe continues to appear well healed.  -May discontinue dressings and go into regular shoes.  - Advised patient to go to the ER or return to office if the  area worsens or if constitutional symptoms are present. -Discussed and educated patient on diabetic foot care, especially with  regards to the vascular, neurological and musculoskeletal systems.  -Stressed the importance of good glycemic control and the detriment of not  controlling glucose levels in relation to the foot. -Discussed supportive shoes at all times and checking feet regularly.  -Mechanically debrided all nails 1-5 bilateral using sterile nail nipper and filed with dremel without incident  -Patient to return to office in 6 weeks.   Louann Sjogren , DPM

## 2021-10-28 DIAGNOSIS — E119 Type 2 diabetes mellitus without complications: Secondary | ICD-10-CM | POA: Diagnosis not present

## 2021-10-28 LAB — HM DIABETES EYE EXAM

## 2021-10-31 DIAGNOSIS — M549 Dorsalgia, unspecified: Secondary | ICD-10-CM | POA: Diagnosis not present

## 2021-10-31 DIAGNOSIS — Z79891 Long term (current) use of opiate analgesic: Secondary | ICD-10-CM | POA: Diagnosis not present

## 2021-10-31 DIAGNOSIS — G894 Chronic pain syndrome: Secondary | ICD-10-CM | POA: Diagnosis not present

## 2021-10-31 DIAGNOSIS — M5137 Other intervertebral disc degeneration, lumbosacral region: Secondary | ICD-10-CM | POA: Diagnosis not present

## 2021-10-31 DIAGNOSIS — M48061 Spinal stenosis, lumbar region without neurogenic claudication: Secondary | ICD-10-CM | POA: Diagnosis not present

## 2021-10-31 DIAGNOSIS — M47816 Spondylosis without myelopathy or radiculopathy, lumbar region: Secondary | ICD-10-CM | POA: Diagnosis not present

## 2021-10-31 DIAGNOSIS — Z1389 Encounter for screening for other disorder: Secondary | ICD-10-CM | POA: Diagnosis not present

## 2021-10-31 DIAGNOSIS — M5136 Other intervertebral disc degeneration, lumbar region: Secondary | ICD-10-CM | POA: Diagnosis not present

## 2021-11-02 ENCOUNTER — Encounter: Payer: Self-pay | Admitting: Legal Medicine

## 2021-11-03 ENCOUNTER — Telehealth: Payer: Self-pay

## 2021-11-03 NOTE — Progress Notes (Signed)
    Chronic Care Management Pharmacy Assistant   Name: Andrew Wiggins  MRN: [id number] DOB: 1955/10/13   Reason for Encounter: Medication Coordination for [facility]   Recent office visits:  None  Recent consult visits:  10/24/21 (Podiatry) Louann Sjogren DPM. Seen for Peripheral Artery Disease. No med changes.  Hospital visits:  None  Medications: Outpatient Encounter Medications as of 11/03/2021  Medication Sig   aspirin EC 81 MG tablet Take 1 tablet (81 mg total) by mouth daily. Swallow whole.   atorvastatin (LIPITOR) 20 MG tablet Take 1 tablet (20 mg total) by mouth daily.   clopidogrel (PLAVIX) 75 MG tablet Take 1 tablet (75 mg total) by mouth daily.   gabapentin (NEURONTIN) 300 MG capsule Take 2 capsules (600 mg total) by mouth 3 (three) times daily. Take 2 capsules 3 times a day   glimepiride (AMARYL) 4 MG tablet Take 1 tablet (4 mg total) by mouth daily.   hydrochlorothiazide (MICROZIDE) 12.5 MG capsule Take 1 capsule (12.5 mg total) by mouth daily.   HYDROcodone-acetaminophen (NORCO) 10-325 MG tablet Take 1 tablet by mouth 3 (three) times daily.   insulin degludec (TRESIBA FLEXTOUCH) 100 UNIT/ML FlexTouch Pen Inject 10 Units into the skin daily.   lisinopril (ZESTRIL) 20 MG tablet TAKE 1 TABLET BY MOUTH IN THE MORNING AND AT BEDTIME   metFORMIN (GLUCOPHAGE-XR) 500 MG 24 hr tablet TAKE 1 TABLET BY MOUTH ONCE DAILY WITH EVENING MEAL   Semaglutide (RYBELSUS) 7 MG TABS Take 7 mg by mouth daily.   No facility-administered encounter medications on file as of 11/03/2021.    Reviewed chart for medication changes ahead of medication coordination call.  No Ovs, hospital visits since last care coordination call/Pharmacist visit.   No medication changes indicated OR if recent visit, treatment plan here.  BP Readings from Last 3 Encounters:  09/19/21 (!) 158/63  08/29/21 (!) 174/76  06/27/21 133/70    Lab Results  Component Value Date   HGBA1C 9.5 (H) 06/17/2021     Patient  obtains medications through Adherence Packaging  30 Days   Last adherence delivery included:  Aspirin 81mg  1 at EM Atorvastatin 20mg  1 at EM HCTZ 12.5mg  1 at B Lisinopril 20mg  1 at B 1 at EM Metformin 500mg  1 at EM Rybelsus 7mg  1 at B Clopidogrel 75mg  1 at B Gabapentin 300mg  2 at B, 2 at L, 2 at EM  Patient declined (meds) last month  Unable to get in touch with pt  Patient is due for next adherence delivery on: 11/15/21. Called patient and reviewed medications and coordinated delivery.  This delivery to include: Aspirin 81mg  1 at EM Atorvastatin 20mg  1 at EM HCTZ 12.5mg  1 at B Lisinopril 20mg  1 at B 1 at EM Metformin 500mg  1 at EM Clopidogrel 75mg  1 at B Gabapentin 300mg  2 at B, 2 at L, 2 at EM  Patient declined the following medications  Rybelsus 7mg  1 at B  Patient needs refills None  Unable to reach pt for this call   , [facility] Clinical Pharmacist Assistant  [phone]

## 2021-11-07 NOTE — Progress Notes (Signed)
Patient obtains medications through Adherence Packaging  30 Days    Last adherence delivery included:  Aspirin 81mg  1 at EM Atorvastatin 20mg  1 at EM HCTZ 12.5mg  1 at B Lisinopril 20mg  1 at B 1 at EM Metformin 500mg  1 at EM Rybelsus 7mg  1 at B Clopidogrel 75mg  1 at B Gabapentin 300mg  2 at B, 2 at L, 2 at EM   Patient declined (meds) last month  Unable to get in touch with pt   Patient is due for next adherence delivery on: 11/15/21. Called patient and reviewed medications and coordinated delivery.   This delivery to include: Aspirin 81mg  1 at EM Atorvastatin 20mg  1 at EM HCTZ 12.5mg  1 at B Lisinopril 20mg  1 at B 1 at EM Metformin 500mg  1 at EM Clopidogrel 75mg  1 at B Gabapentin 300mg  2 at B, 2 at L, 2 at EM   Patient declined the following medications  Rybelsus 7mg  - Pt received samples to get him by until PAP is approved  Glimepiride 4mg -Filled out [facility] 08/21/21 90ds Tresiba Flextouch- Filled on 06/20/21 150ds. Pt stated his sugars were dropping so low that he stopped this. He wakes up shaky, trembling and sweaty. He was taking 10units at night. He stated last time he checked it without this insulin his BS was 124 fasting. I have sent a message to the provider about this.   Patient needs refills None  , [facility] Clinical Pharmacist Assistant  [phone]

## 2021-11-09 ENCOUNTER — Telehealth: Payer: Self-pay

## 2021-11-09 NOTE — Telephone Encounter (Signed)
Called patient to address concerns about his blood sugars dropping in the morning, left message for patient to call office to schedule fasting appointment with provider.

## 2021-11-25 ENCOUNTER — Ambulatory Visit (INDEPENDENT_AMBULATORY_CARE_PROVIDER_SITE_OTHER): Payer: PPO

## 2021-11-25 DIAGNOSIS — I442 Atrioventricular block, complete: Secondary | ICD-10-CM

## 2021-11-25 LAB — CUP PACEART REMOTE DEVICE CHECK
Battery Remaining Longevity: 153 mo
Battery Voltage: 3.15 V
Brady Statistic AP VP Percent: 2.23 %
Brady Statistic AP VS Percent: 0 %
Brady Statistic AS VP Percent: 97.76 %
Brady Statistic AS VS Percent: 0 %
Brady Statistic RA Percent Paced: 2.27 %
Brady Statistic RV Percent Paced: 100 %
Date Time Interrogation Session: 20230721015946
Implantable Lead Implant Date: 20091120
Implantable Lead Implant Date: 20091120
Implantable Lead Location: 753859
Implantable Lead Location: 753860
Implantable Lead Model: 5076
Implantable Lead Model: 5076
Implantable Pulse Generator Implant Date: 20230118
Lead Channel Impedance Value: 323 Ohm
Lead Channel Impedance Value: 437 Ohm
Lead Channel Impedance Value: 741 Ohm
Lead Channel Impedance Value: 779 Ohm
Lead Channel Pacing Threshold Amplitude: 0.75 V
Lead Channel Pacing Threshold Amplitude: 0.75 V
Lead Channel Pacing Threshold Pulse Width: 0.4 ms
Lead Channel Pacing Threshold Pulse Width: 0.4 ms
Lead Channel Sensing Intrinsic Amplitude: 3 mV
Lead Channel Sensing Intrinsic Amplitude: 3 mV
Lead Channel Sensing Intrinsic Amplitude: 6.75 mV
Lead Channel Setting Pacing Amplitude: 1.5 V
Lead Channel Setting Pacing Amplitude: 2 V
Lead Channel Setting Pacing Pulse Width: 0.4 ms
Lead Channel Setting Sensing Sensitivity: 0.9 mV

## 2021-12-05 ENCOUNTER — Telehealth: Payer: Self-pay

## 2021-12-05 ENCOUNTER — Ambulatory Visit: Payer: PPO | Admitting: Podiatry

## 2021-12-05 ENCOUNTER — Encounter: Payer: Self-pay | Admitting: Podiatry

## 2021-12-05 DIAGNOSIS — I739 Peripheral vascular disease, unspecified: Secondary | ICD-10-CM

## 2021-12-05 DIAGNOSIS — Z899 Acquired absence of limb, unspecified: Secondary | ICD-10-CM

## 2021-12-05 DIAGNOSIS — E1169 Type 2 diabetes mellitus with other specified complication: Secondary | ICD-10-CM | POA: Diagnosis not present

## 2021-12-05 NOTE — Progress Notes (Signed)
Subjective: Andrew Wiggins is a 66 y.o. male patient seen in office for evaluation of gangrene at the left fourth toe. Relates area is still doing well.  Has been in regular shoes as well. No constitutional symptoms at this time.  Relates he has appointment coming up to make re check blood flow.   PCP Suzanne Boron MD     Patient Active Problem List   Diagnosis Date Noted   Gangrene of toe (HCC) 06/19/2021   AV block    Routine general medical examination at a health care facility 06/16/2020   Diabetes mellitus (HCC) 02/04/2020   Tobacco use 02/04/2020   Complete AV block (HCC)    Dermatitis, contact    GERD (gastroesophageal reflux disease)    PVD (peripheral vascular disease) (HCC)    Sciatica 11/18/2019   Mixed hyperlipidemia 06/19/2019   Diabetes mellitus with neuropathy (HCC) 12/20/2016   Allergic rhinitis 09/04/2012   Dyslipidemia 03/06/2012   Carotid artery disease (HCC) 07/27/2010   Cardiac pacemaker in situ 11/26/2008   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 Swallow whole. 150 tablet 2   atorvastatin (LIPITOR) 20 MG tablet Take 1 tablet (20 mg total) by mouth daily. 90 tablet 1   clopidogrel (PLAVIX) 75 MG tablet Take 1 tablet (75 mg total) by mouth daily. 90 tablet 1   gabapentin (NEURONTIN) 300 MG capsule Take 2 capsules (600 mg total) by mouth 3 (three) times daily. Take 2 capsules 3 times a day 270 capsule 1   glimepiride (AMARYL) 4 MG tablet Take 1 tablet (4 mg total) by mouth daily. 90 tablet 1   hydrochlorothiazide (MICROZIDE) 12.5 MG capsule Take 1 capsule (12.5 mg total) by mouth daily. 90 capsule 1    HYDROcodone-acetaminophen (NORCO) 10-325 MG tablet Take 1 tablet by mouth 3 (three) times daily.     insulin degludec (TRESIBA FLEXTOUCH) 100 UNIT/ML FlexTouch Pen Inject 10 Units into the skin daily. 6 mL 3   lisinopril (ZESTRIL) 20 MG tablet TAKE 1 TABLET BY MOUTH IN THE MORNING AND AT BEDTIME 180 tablet 1   metFORMIN (GLUCOPHAGE-XR) 500 MG 24 hr tablet TAKE 1 TABLET BY MOUTH ONCE DAILY WITH EVENING MEAL 90 tablet 1   Semaglutide (RYBELSUS) 7 MG TABS Take 7 mg by mouth daily. 90 tablet 1   No current facility-administered medications on file prior to visit.   No Known Allergies  Recent Results (from the past 2160 hour(s))  HM DIABETES EYE EXAM     Status: Normal   Collection Time: 10/28/21 12:00 AM  Result Value Ref Range   HM Diabetic Eye Exam No Retinopathy No Retinopathy  CUP PACEART REMOTE DEVICE CHECK     Status: None   Collection Time: 11/25/21  1:59 AM  Result Value Ref Range   Date Time Interrogation Session [id number]    Pulse Generator Manufacturer MERM    Pulse Gen Model W1DR01 Azure XT DR MRI    Pulse Gen Serial Number [id number]    Clinic Name [facility]    Implantable Pulse Generator Type Implantable Pulse Generator    Implantable Pulse Generator Implant Date [id number]    Implantable Lead Manufacturer MERM  Implantable Lead Model 5076 CapSureFix Novus    Implantable Lead Serial Number [id number]    Implantable Lead Implant Date [id number]    Implantable Lead Location [phone]    Implantable Lead Manufacturer [facility]    Implantable Lead Model 5076 CapSureFix Novus    Implantable Lead Serial Number [id number]    Implantable Lead Implant Date [id number]    Implantable Lead Location [phone]    Lead Channel Setting Sensing Sensitivity 0.9 mV   Lead Channel Setting Pacing Amplitude 1.5 V   Lead Channel Setting Pacing Pulse Width 0.4 ms   Lead Channel Setting Pacing Amplitude 2 V   Lead Channel Impedance Value 437 ohm   Lead Channel Impedance Value 323 ohm   Lead  Channel Sensing Intrinsic Amplitude 3 mV   Lead Channel Sensing Intrinsic Amplitude 3 mV   Lead Channel Pacing Threshold Amplitude 0.75 V   Lead Channel Pacing Threshold Pulse Width 0.4 ms   Lead Channel Impedance Value 779 ohm   Lead Channel Impedance Value 741 ohm   Lead Channel Sensing Intrinsic Amplitude 6.75 mV   Lead Channel Pacing Threshold Amplitude 0.75 V   Lead Channel Pacing Threshold Pulse Width 0.4 ms   Battery Status OK    Battery Remaining Longevity 153 mo   Battery Voltage 3.15 V   Brady Statistic RA Percent Paced 2.27 %   Brady Statistic RV Percent Paced 100 %   Brady Statistic AP VP Percent 2.23 %   Brady Statistic AS VP Percent 97.76 %   Brady Statistic AP VS Percent 0 %   Brady Statistic AS VS Percent 0 %    Objective: There were no vitals filed for this visit.  General: Patient is awake, alert, oriented x 3 and in no acute distress.  Dermatology: Fourth remaining digit appears well healed.No erythema noted Some edema noted to proximal foot.   Nails are elongated and thickened consistent with onychomycosis.    Vascular: DP and PT pedal pulses nonpalpable on the left foot.  Temperature gradient appears to be within normal limits, no pedal hair growth noted, minimal/trace edema noted to the left foot.  Neurologic: Protective sensation absent and subjective pins-and-needles sensation likely consistent with diabetic neuropathy.  Musculosketal: There is no reproducible pain to palpation to the ulcerated/gangrenous area.  No results for input(s): "GRAMSTAIN", "LABORGA" in the last 8760 hours.  Assessment and Plan:  Problem List Items Addressed This Visit       Endocrine   Diabetes mellitus (HCC)   Other Visit Diagnoses     PAD (peripheral artery disease) (HCC)    -  Primary   History of amputation            -Examined patient and discussed the progression of the wound and treatment alternatives. -Toe continues to appear well healed.  -May  discontinue dressings and go into regular shoes.  - Advised patient to go to the ER or return to office if the area worsens or if constitutional symptoms are present. -Discussed and educated patient on diabetic foot care, especially with  regards to the vascular, neurological and musculoskeletal systems.  -Stressed the importance of good glycemic control and the detriment of not  controlling glucose levels in relation to the foot. -Discussed supportive shoes at all times and checking feet regularly.  -Patient to return to office in 6 weeks for rfc and will then get him on a 3 month schedule.    Louann Sjogren , DPM

## 2021-12-05 NOTE — Progress Notes (Signed)
    Chronic Care Management Pharmacy Assistant   Name: Andrew Wiggins  MRN: [id number] DOB: [birth year]   Reason for Encounter: Medication Coordination for Upstream    Recent office visits:  None  Recent consult visits:  None  Hospital visits:  None  Medications: Outpatient Encounter Medications as of 12/05/2021  Medication Sig   aspirin EC 81 MG tablet Take 1 tablet (81 mg total) by mouth daily. Swallow whole.   atorvastatin (LIPITOR) 20 MG tablet Take 1 tablet (20 mg total) by mouth daily.   clopidogrel (PLAVIX) 75 MG tablet Take 1 tablet (75 mg total) by mouth daily.   gabapentin (NEURONTIN) 300 MG capsule Take 2 capsules (600 mg total) by mouth 3 (three) times daily. Take 2 capsules 3 times a day   glimepiride (AMARYL) 4 MG tablet Take 1 tablet (4 mg total) by mouth daily.   hydrochlorothiazide (MICROZIDE) 12.5 MG capsule Take 1 capsule (12.5 mg total) by mouth daily.   HYDROcodone-acetaminophen (NORCO) 10-325 MG tablet Take 1 tablet by mouth 3 (three) times daily.   insulin degludec (TRESIBA FLEXTOUCH) 100 UNIT/ML FlexTouch Pen Inject 10 Units into the skin daily.   lisinopril (ZESTRIL) 20 MG tablet TAKE 1 TABLET BY MOUTH IN THE MORNING AND AT BEDTIME   metFORMIN (GLUCOPHAGE-XR) 500 MG 24 hr tablet TAKE 1 TABLET BY MOUTH ONCE DAILY WITH EVENING MEAL   Semaglutide (RYBELSUS) 7 MG TABS Take 7 mg by mouth daily.   No facility-administered encounter medications on file as of 12/05/2021.    Reviewed chart for medication changes ahead of medication coordination call.  No OVs, Consults, or hospital visits since last care coordination call/Pharmacist visit.  No medication changes indicated OR if recent visit, treatment plan here.  BP Readings from Last 3 Encounters:  09/19/21 (!) 158/63  08/29/21 (!) 174/76  06/27/21 133/70    Lab Results  Component Value Date   HGBA1C 9.5 (H) 06/17/2021     Patient obtains medications through Adherence Packaging  30 Days   Last  adherence delivery included:  Aspirin 81mg  1 at EM Atorvastatin 20mg  1 at EM HCTZ 12.5mg  1 at B Lisinopril 20mg  1 at B 1 at EM Metformin 500mg  1 at EM Clopidogrel 75mg  1 at B Gabapentin 300mg  2 at B, 2 at L, 2 at EM  Patient declined (meds) last month  Rybelsus 7mg  - Pt received samples to get him by until PAP is approved  Glimepiride 4mg -Filled out [facility] 08/21/21 90ds Tresiba Flextouch- Filled on 06/20/21 150ds. Pt stated his sugars were dropping so low that he stopped this. He wakes up shaky, trembling and sweaty. He was taking 10units at night. He stated last time he checked it without this insulin his BS was 124 fasting. I have sent a message to the provider about this.   Patient is due for next adherence delivery on: 12/15/21. Called patient and reviewed medications and coordinated delivery.  This delivery to include: Aspirin 81mg  1 at EM Atorvastatin 20mg  1 at EM HCTZ 12.5mg  1 at B Lisinopril 20mg  1 at B 1 at EM Metformin 500mg  1 at EM Clopidogrel 75mg  1 at B Gabapentin 300mg  2 at B, 2 at L, 2 at EM  Patient needs refills  None  Unable to reach pt today so we will close encounter and try again tomorrow    , [facility] Clinical Pharmacist Assistant  [phone]

## 2021-12-06 ENCOUNTER — Telehealth: Payer: Self-pay

## 2021-12-06 DIAGNOSIS — Z79891 Long term (current) use of opiate analgesic: Secondary | ICD-10-CM | POA: Diagnosis not present

## 2021-12-06 DIAGNOSIS — M549 Dorsalgia, unspecified: Secondary | ICD-10-CM | POA: Diagnosis not present

## 2021-12-06 DIAGNOSIS — M5136 Other intervertebral disc degeneration, lumbar region: Secondary | ICD-10-CM | POA: Diagnosis not present

## 2021-12-06 DIAGNOSIS — G894 Chronic pain syndrome: Secondary | ICD-10-CM | POA: Diagnosis not present

## 2021-12-06 DIAGNOSIS — M48061 Spinal stenosis, lumbar region without neurogenic claudication: Secondary | ICD-10-CM | POA: Diagnosis not present

## 2021-12-06 DIAGNOSIS — M5137 Other intervertebral disc degeneration, lumbosacral region: Secondary | ICD-10-CM | POA: Diagnosis not present

## 2021-12-06 DIAGNOSIS — M47816 Spondylosis without myelopathy or radiculopathy, lumbar region: Secondary | ICD-10-CM | POA: Diagnosis not present

## 2021-12-06 DIAGNOSIS — Z1389 Encounter for screening for other disorder: Secondary | ICD-10-CM | POA: Diagnosis not present

## 2021-12-06 NOTE — Progress Notes (Unsigned)
    Chronic Care Management Pharmacy Assistant   Name: Andrew Wiggins  MRN: [id number] DOB: July 24, 1955   Reason for Encounter: Medication Coordination for Upstream    Recent office visits:  None  Recent consult visits:  None  Hospital visits:  None  Medications: Outpatient Encounter Medications as of 12/06/2021  Medication Sig   aspirin EC 81 MG tablet Take 1 tablet (81 mg total) by mouth daily. Swallow whole.   atorvastatin (LIPITOR) 20 MG tablet Take 1 tablet (20 mg total) by mouth daily.   clopidogrel (PLAVIX) 75 MG tablet Take 1 tablet (75 mg total) by mouth daily.   gabapentin (NEURONTIN) 300 MG capsule Take 2 capsules (600 mg total) by mouth 3 (three) times daily. Take 2 capsules 3 times a day   glimepiride (AMARYL) 4 MG tablet Take 1 tablet (4 mg total) by mouth daily.   hydrochlorothiazide (MICROZIDE) 12.5 MG capsule Take 1 capsule (12.5 mg total) by mouth daily.   HYDROcodone-acetaminophen (NORCO) 10-325 MG tablet Take 1 tablet by mouth 3 (three) times daily.   insulin degludec (TRESIBA FLEXTOUCH) 100 UNIT/ML FlexTouch Pen Inject 10 Units into the skin daily.   lisinopril (ZESTRIL) 20 MG tablet TAKE 1 TABLET BY MOUTH IN THE MORNING AND AT BEDTIME   metFORMIN (GLUCOPHAGE-XR) 500 MG 24 hr tablet TAKE 1 TABLET BY MOUTH ONCE DAILY WITH EVENING MEAL   Semaglutide (RYBELSUS) 7 MG TABS Take 7 mg by mouth daily.   No facility-administered encounter medications on file as of 12/06/2021.    Reviewed chart for medication changes ahead of medication coordination call.  No OVs, Consults, or hospital visits since last care coordination call/Pharmacist visit.   No medication changes indicated OR if recent visit, treatment plan here.  BP Readings from Last 3 Encounters:  09/19/21 (!) 158/63  08/29/21 (!) 174/76  06/27/21 133/70    Lab Results  Component Value Date   HGBA1C 9.5 (H) 06/17/2021     Patient obtains medications through Adherence Packaging  30 Days   Last adherence  delivery included:  Aspirin 81mg  1 at EM Atorvastatin 20mg  1 at EM HCTZ 12.5mg  1 at B Lisinopril 20mg  1 at B 1 at EM Metformin 500mg  1 at EM Clopidogrel 75mg  1 at B Gabapentin 300mg  2 at B, 2 at L, 2 at EM   Patient declined (meds) last month  Rybelsus 7mg  - Pt received samples to get him by until PAP is approved  Glimepiride 4mg -Filled out [facility] 08/21/21 90ds Tresiba Flextouch- Filled on 06/20/21 150ds. Pt stated his sugars were dropping so low that he stopped this. He wakes up shaky, trembling and sweaty. He was taking 10units at night. He stated last time he checked it without this insulin his BS was 124 fasting. I have sent a message to the provider about this.    Patient is due for next adherence delivery on: 12/15/21. Called patient and reviewed medications and coordinated delivery.   This delivery to include: Aspirin 81mg  1 at EM Atorvastatin 20mg  1 at EM HCTZ 12.5mg  1 at B Lisinopril 20mg  1 at B 1 at EM Metformin 500mg  1 at EM Clopidogrel 75mg  1 at B Gabapentin 300mg  2 at B, 2 at L, 2 at EM   Patient needs refills  None  Declined the following the meds: Glimepiride 4mg -Filled at [facility] on 11/21/21 90ds Flextouch-This has been stopped due to causing sugars too low    , CMA  [phone]

## 2021-12-08 NOTE — Telephone Encounter (Signed)
Tried calling patient, unable to connect

## 2021-12-09 NOTE — Progress Notes (Signed)
Remote pacemaker transmission.   

## 2021-12-12 ENCOUNTER — Encounter: Payer: Self-pay | Admitting: Legal Medicine

## 2021-12-12 NOTE — Progress Notes (Unsigned)
Subjective:  Patient ID: Andrew Wiggins, male    DOB: [birth year]  Age: 66 y.o. MRN: [id number]  No chief complaint on file.   HPI Patient presents with hyperlipidemia.  Compliance with treatment has been good; patient takes medicines as directed, maintains low cholesterol diet, follows up as directed, and maintains exercise regimen.  Patient is using  Atorvastatin 20 mg daily without problems.    Patient presents for follow up of hypertension.  Patient tolerating  Hctz 12.5 mg daily, Lisinopril 20 mg daily Aspirin 81 mg daily well without side effects. Patient is working on maintaining diet and exercise regimen and follows up as directed.   Patient present with type 2 diabetes. Compliance with treatment has been good; patient take medicines as directed, maintains diet and exercise regimen, follows up as directed, and is keeping glucose diary. Current medicines for diabetes Glimepiride 4 mg daily, Metformin 500 mg  daily, Tresiba 10 units daily, Rybelsus 7 mg daily. Patient performs foot exams daily and last ophthalmologic exam was 10/28/2021.   Current Outpatient Medications on File Prior to Visit  Medication Sig Dispense Refill   aspirin EC 81 MG tablet Take 1 tablet (81 mg total) by mouth daily. Swallow whole. 150 tablet 2   atorvastatin (LIPITOR) 20 MG tablet Take 1 tablet (20 mg total) by mouth daily. 90 tablet 1   clopidogrel (PLAVIX) 75 MG tablet Take 1 tablet (75 mg total) by mouth daily. 90 tablet 1   gabapentin (NEURONTIN) 300 MG capsule Take 2 capsules (600 mg total) by mouth 3 (three) times daily. Take 2 capsules 3 times a day 270 capsule 1   glimepiride (AMARYL) 4 MG tablet Take 1 tablet (4 mg total) by mouth daily. 90 tablet 1   hydrochlorothiazide (MICROZIDE) 12.5 MG capsule Take 1 capsule (12.5 mg total) by mouth daily. 90 capsule 1   HYDROcodone-acetaminophen (NORCO) 10-325 MG tablet Take 1 tablet by mouth 3 (three) times daily.     insulin degludec (TRESIBA FLEXTOUCH) 100  UNIT/ML FlexTouch Pen Inject 10 Units into the skin daily. 6 mL 3   lisinopril (ZESTRIL) 20 MG tablet TAKE 1 TABLET BY MOUTH IN THE MORNING AND AT BEDTIME 180 tablet 1   metFORMIN (GLUCOPHAGE-XR) 500 MG 24 hr tablet TAKE 1 TABLET BY MOUTH ONCE DAILY WITH EVENING MEAL 90 tablet 1   Semaglutide (RYBELSUS) 7 MG TABS Take 7 mg by mouth daily. 90 tablet 1   No current facility-administered medications on file prior to visit.   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 ~  Jan11:  Last seen by Cards & pacer reprogrammed...     Cardiac pacemaker in situ    Carotid artery disease (HCC) 07/27/2010   R/O PERIPHERAL VASCULAR DISEASE (ICD-443.9) - on ASA 81mg /d... exam 11/09 shows gr 1-2 sys murmur LSB, but also has prominent bruit to base of right side of his neck w/ right > left Carotid Bruit... ~  CDopplers 11/09 showed severe plaque right ICA, & min plaque on left... no signif ICA stenoses per report. 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 smokes 1/2 - 1ppd x yrs... ~  PFT's 11/08 showed FVC= 3.85 (79%), FEV1= 2.19 (55%), %1sec= 57, mid-flows= 32% predicted:  all c/w mod airflow obstruction...  ~  CXR 12/10 showed left subclav pacer, mild hyperinflation, NAD...     Dermatitis, contact    due to poison ivy   Diabetes mellitus with neuropathy (HCC) 12/20/2016   Dyslipidemia 03/06/2012   Essential hypertension 04/10/2007    HYPERTENSION (ICD-401.9) - prev on Micardis 80mg /d then switched to Losartan  100mg /d, but he lost his job in 2011& couldn't afford Rx;  He called 2/12 w/ this news & we phoned in Lisinopril 20mg /d... ~  2DEcho 11/09 showed sl incr LVwall thickness, norm LVF w/ EF= 55-60%, no regional wall motion abn, mild DD, mild MR... ~  Myoview 12/09 showed LBBB, no perfusion defects, abn septal motion & EF   Gangrene of toe (HCC) 06/19/2021   GERD 03/26/2008    GERD (ICD-530.81) - prev mild GERD symptoms that improved after cholecystectomy on 2000... uses OTC PPI as needed... ~  he is due for routine screening colonoscopy...       GERD (gastroesophageal reflux disease)    Headache(784.0)    Hypertension    Mixed hyperlipidemia 06/19/2019   Peripheral vascular disease (HCC)    Sciatica 11/18/2019   Tobacco use disorder    Past Surgical History:  Procedure Laterality Date   ABDOMINAL AORTOGRAM W/LOWER EXTREMITY N/A 12/14/2020   Procedure: ABDOMINAL AORTOGRAM W/LOWER EXTREMITY;  Surgeon: 08/17/2019, MD;  Location: [facility];  Service: Cardiovascular;  Laterality: N/A;   ABDOMINAL AORTOGRAM W/LOWER EXTREMITY N/A 05/24/2021   Procedure: ABDOMINAL AORTOGRAM W/LOWER EXTREMITY;  Surgeon: 02/13/2021, MD;  Location: [facility];  Service: Cardiovascular;  Laterality: N/A;   laproscopic cholecystectomy  2000   by Dr. Nada Libman   PACEMAKER INSERTION  03/2008   by Dr. Nada Libman   PERIPHERAL VASCULAR INTERVENTION Left 12/14/2020   Procedure: PERIPHERAL VASCULAR INTERVENTION;  Surgeon: 04/2008, MD;  Location: [facility];  Service: Cardiovascular;  Laterality: Left;  SFA   PERIPHERAL VASCULAR INTERVENTION Left 05/24/2021   Procedure: PERIPHERAL VASCULAR INTERVENTION;  Surgeon: 02/13/2021, MD;  Location: [facility];  Service: Cardiovascular;  Laterality: Left;   PPM GENERATOR CHANGEOUT N/A 05/25/2021   Procedure: PPM GENERATOR CHANGEOUT;  Surgeon: 05/26/2021, MD;  Location: [facility];  Service: Cardiovascular;  Laterality: N/A;     Family History  Problem Relation Age of Onset   Diabetes Brother    Social History   Socioeconomic History   Marital status: Legally Separated    Spouse name: Not on file   Number of children: 2   Years of education: Not on file   Highest education level: Not on file  Occupational History   Occupation: unemployed    Comment: works for himself  Tobacco Use   Smoking status: Every Day    Packs/day: 1.00    Years: 25.00    Total pack years: 25.00    Types: Cigarettes    Passive exposure: Never   Smokeless tobacco: Never  Vaping Use   Vaping Use: Never used  Substance and Sexual Activity   Alcohol use: Not Currently    Alcohol/week: 0.0 standard drinks of alcohol   Drug use: No   Sexual activity: Not Currently  Other Topics Concern   Not on file  Social History Narrative   2 siblings died with leukemia around age 54   Social Determinants of Health   Financial Resource Strain: Low Risk  (06/17/2021)   Overall Financial Resource Strain (CARDIA)    Difficulty of Paying Living Expenses: Not hard at all  Food Insecurity: No Food Insecurity (06/16/2020)   Hunger Vital Sign    Worried About Running Out of Food in the Last Year: Never true    Ran Out of Food in the Last Year: Never true  Transportation Needs: No Transportation Needs (06/17/2021)   PRAPARE - Administrator, Civil Service (Medical): No    Lack of Transportation (Non-Medical): No  Physical Activity: Inactive (06/16/2020)   Exercise Vital Sign    Days of Exercise per Week: 0 days    Minutes of Exercise per Session: 30 min  Stress: Stress Concern Present (06/16/2020)   [employer] of Occupational Health - Occupational Stress Questionnaire    Feeling of Stress : To some extent  Social Connections: Socially Isolated (06/16/2020)   Social Connection and Isolation Panel [NHANES]    Frequency of Communication with Friends and Family: Three times a week    Frequency of Social Gatherings with Friends and  Family: Never    Attends Religious Services: Never    Database administrator or Organizations: No    Attends Banker Meetings: Never    Marital Status: Divorced    Review of Systems   Objective:  There were no vitals taken for this visit.     09/19/2021    3:21 PM 08/29/2021   10:37 AM 06/27/2021    1:56 PM  BP/Weight  Systolic BP 158 174 133  Diastolic BP 63 76 70  Wt. (Lbs) 148.9 147 142  BMI 21.36 kg/m2 21.09 kg/m2 21.59 kg/m2    Physical Exam  Diabetic Foot Exam - Simple   No data filed      Lab Results  Component Value Date   WBC 11.6 (H) 06/17/2021   HGB 14.7 06/17/2021   HCT 43.9 06/17/2021   PLT 229 06/17/2021   GLUCOSE 177 (H) 06/17/2021   CHOL 111 06/17/2021   TRIG 227 (H) 06/17/2021   HDL 27 (L) 06/17/2021   LDLDIRECT 113.0 12/20/2017   LDLCALC 48 06/17/2021   ALT 11 06/17/2021   AST 17 06/17/2021   NA 140 06/17/2021   K 4.5 06/17/2021   CL 102 06/17/2021   CREATININE 0.90 06/17/2021   BUN 10 06/17/2021   CO2 24 06/17/2021   TSH 1.46 12/20/2017   PSA 0.63 12/20/2017   HGBA1C 9.5 (H) 06/17/2021   MICROALBUR 2.9 (H) 02/07/2017      Assessment & Plan:   Problem List Items Addressed This Visit       Cardiovascular and Mediastinum   Essential hypertension - Primary   Carotid artery disease (HCC)     Respiratory   COPD (chronic obstructive pulmonary disease) with chronic bronchitis (HCC)     Other   Cardiac pacemaker in situ   Mixed hyperlipidemia  .  No orders of the defined types were placed in this encounter.   No orders of the defined types were placed in this encounter.    Follow-up: No follow-ups on file.  An After Visit Summary was printed and given to the patient.  Brent Bulla, MD [facility] [phone]

## 2021-12-13 ENCOUNTER — Encounter: Payer: Self-pay | Admitting: Legal Medicine

## 2021-12-13 ENCOUNTER — Ambulatory Visit (INDEPENDENT_AMBULATORY_CARE_PROVIDER_SITE_OTHER): Payer: PPO | Admitting: Legal Medicine

## 2021-12-13 VITALS — BP 132/74 | HR 70 | Temp 97.1°F | Ht 71.0 in | Wt 147.0 lb

## 2021-12-13 DIAGNOSIS — J449 Chronic obstructive pulmonary disease, unspecified: Secondary | ICD-10-CM

## 2021-12-13 DIAGNOSIS — Z950 Presence of cardiac pacemaker: Secondary | ICD-10-CM | POA: Diagnosis not present

## 2021-12-13 DIAGNOSIS — I779 Disorder of arteries and arterioles, unspecified: Secondary | ICD-10-CM | POA: Diagnosis not present

## 2021-12-13 DIAGNOSIS — J4489 Other specified chronic obstructive pulmonary disease: Secondary | ICD-10-CM

## 2021-12-13 DIAGNOSIS — I10 Essential (primary) hypertension: Secondary | ICD-10-CM

## 2021-12-13 DIAGNOSIS — E782 Mixed hyperlipidemia: Secondary | ICD-10-CM | POA: Diagnosis not present

## 2021-12-13 DIAGNOSIS — E0840 Diabetes mellitus due to underlying condition with diabetic neuropathy, unspecified: Secondary | ICD-10-CM | POA: Diagnosis not present

## 2021-12-13 NOTE — Patient Instructions (Signed)

## 2021-12-14 LAB — CBC WITH DIFFERENTIAL/PLATELET
Basophils Absolute: 0 10*3/uL (ref 0.0–0.2)
Basos: 0 %
EOS (ABSOLUTE): 0.2 10*3/uL (ref 0.0–0.4)
Eos: 2 %
Hematocrit: 36.8 % — ABNORMAL LOW (ref 37.5–51.0)
Hemoglobin: 11.7 g/dL — ABNORMAL LOW (ref 13.0–17.7)
Immature Grans (Abs): 0 10*3/uL (ref 0.0–0.1)
Immature Granulocytes: 0 %
Lymphocytes Absolute: 3 10*3/uL (ref 0.7–3.1)
Lymphs: 32 %
MCH: 29 pg (ref 26.6–33.0)
MCHC: 31.8 g/dL (ref 31.5–35.7)
MCV: 91 fL (ref 79–97)
Monocytes Absolute: 0.8 10*3/uL (ref 0.1–0.9)
Monocytes: 8 %
Neutrophils Absolute: 5.5 10*3/uL (ref 1.4–7.0)
Neutrophils: 58 %
Platelets: 281 10*3/uL (ref 150–450)
RBC: 4.03 x10E6/uL — ABNORMAL LOW (ref 4.14–5.80)
RDW: 13.3 % (ref 11.6–15.4)
WBC: 9.4 10*3/uL (ref 3.4–10.8)

## 2021-12-14 LAB — COMPREHENSIVE METABOLIC PANEL
ALT: 14 IU/L (ref 0–44)
AST: 15 IU/L (ref 0–40)
Albumin/Globulin Ratio: 1.7 (ref 1.2–2.2)
Albumin: 4.3 g/dL (ref 3.9–4.9)
Alkaline Phosphatase: 121 IU/L (ref 44–121)
BUN/Creatinine Ratio: 11 (ref 10–24)
BUN: 10 mg/dL (ref 8–27)
Bilirubin Total: <0.2 mg/dL (ref 0.0–1.2)
CO2: 24 mmol/L (ref 20–29)
Calcium: 10.1 mg/dL (ref 8.6–10.2)
Chloride: 100 mmol/L (ref 96–106)
Creatinine, Ser: 0.93 mg/dL (ref 0.76–1.27)
Globulin, Total: 2.5 g/dL (ref 1.5–4.5)
Glucose: 218 mg/dL — ABNORMAL HIGH (ref 70–99)
Potassium: 4 mmol/L (ref 3.5–5.2)
Sodium: 139 mmol/L (ref 134–144)
Total Protein: 6.8 g/dL (ref 6.0–8.5)
eGFR: 91 mL/min/{1.73_m2} (ref >59)

## 2021-12-14 LAB — CARDIOVASCULAR RISK ASSESSMENT

## 2021-12-14 LAB — LIPID PANEL
Chol/HDL Ratio: 4.4 ratio (ref 0.0–5.0)
Cholesterol, Total: 127 mg/dL (ref 100–199)
HDL: 29 mg/dL — ABNORMAL LOW (ref >39)
LDL Chol Calc (NIH): 46 mg/dL (ref 0–99)
Triglycerides: 346 mg/dL — ABNORMAL HIGH (ref 0–149)
VLDL Cholesterol Cal: 52 mg/dL — ABNORMAL HIGH (ref 5–40)

## 2021-12-14 LAB — VITAMIN B12: Vitamin B-12: 280 pg/mL (ref 232–1245)

## 2021-12-14 NOTE — Progress Notes (Signed)
Glucose 218 high, kidney and liver tests normal, triglycerides 346 high, new anemia, Hemoglobin 11.7, give 3 hemocults and follow u one week, B12 280 normal, add A1c

## 2021-12-14 NOTE — Progress Notes (Signed)
Increase tresiba to 30 units qd lp

## 2021-12-19 ENCOUNTER — Telehealth: Payer: Self-pay

## 2021-12-19 NOTE — Chronic Care Management (AMB) (Signed)
12-19-2021: left patient a VM requesting signature on patient assistance application and a copy of medicare card.   Huey Romans [facility] Clinical Pharmacist Assistant [phone]

## 2021-12-21 ENCOUNTER — Telehealth: Payer: PPO

## 2021-12-21 ENCOUNTER — Ambulatory Visit (INDEPENDENT_AMBULATORY_CARE_PROVIDER_SITE_OTHER): Payer: PPO

## 2021-12-21 DIAGNOSIS — I10 Essential (primary) hypertension: Secondary | ICD-10-CM

## 2021-12-21 DIAGNOSIS — E782 Mixed hyperlipidemia: Secondary | ICD-10-CM

## 2021-12-21 DIAGNOSIS — E0840 Diabetes mellitus due to underlying condition with diabetic neuropathy, unspecified: Secondary | ICD-10-CM

## 2021-12-21 NOTE — Patient Instructions (Signed)
Visit Information   Goals Addressed   None    Patient Care Plan: CCM Pharmacy Care Plan     Problem Identified: DM, HTN, Pain   Priority: High  Onset Date: 03/14/2021     Goal: Disease State Management   Start Date: 03/14/2021  Expected End Date: 03/14/2022  Recent Progress: On track  Priority: High  Note:   Current Barriers:  Unable to self administer medications as prescribed  Pharmacist Clinical Goal(s):  Patient will contact provider office for questions/concerns as evidenced notation of same in electronic health record through collaboration with PharmD and provider.   Interventions: 1:1 collaboration with [name], MD regarding development and update of comprehensive plan of care as evidenced by provider attestation and co-signature Inter-disciplinary care team collaboration (see longitudinal plan of care) Comprehensive medication review performed; medication list updated in electronic medical record  Hypertension (BP goal <140/90) BP Readings from Last 3 Encounters:  12/13/21 132/74  09/19/21 (!) 158/63  08/29/21 (!) 174/76  -Controlled -Current treatment: Lisinopril 20mg  BID Appropriate, Effective, Safe, Accessible HCTZ 12.5mg  QD Appropriate, Effective, Safe, Accessible -Medications previously tried: Amlodipine  -Current home readings: Doesn't test -Current dietary habits: "Tries to eat healthy" -Current exercise habits: None -Denies hypotensive/hypertensive symptoms -Educated on BP goals and benefits of medications for prevention of heart attack, stroke and kidney damage; -Counseled to monitor BP at home as needed, document, and provide log at future appointments -Recommended to continue current medication  CAD The ASCVD Risk score (Arnett DK, et al., 2019) failed to calculate for the following reasons:   The valid total cholesterol range is 130 to 320 mg/dL Lab Results  Component Value Date   CHOL 127 12/13/2021   CHOL 111 06/17/2021   CHOL  88 (L) 01/12/2021   Lab Results  Component Value Date   HDL 29 (L) 12/13/2021   HDL 27 (L) 06/17/2021   HDL 27 (L) 01/12/2021   Lab Results  Component Value Date   LDLCALC 46 12/13/2021   LDLCALC 48 06/17/2021   LDLCALC 32 01/12/2021   Lab Results  Component Value Date   TRIG 346 (H) 12/13/2021   TRIG 227 (H) 06/17/2021   TRIG 175 (H) 01/12/2021   Lab Results  Component Value Date   CHOLHDL 4.4 12/13/2021   CHOLHDL 4.1 06/17/2021   CHOLHDL 3.3 01/12/2021   Lab Results  Component Value Date   LDLDIRECT 113.0 12/20/2017   LDLDIRECT 110.0 06/07/2016   LDLDIRECT 118.9 03/06/2012  -Unknown control -Current treatment  ASA 81mg  Appropriate, Effective, Safe, Accessible Clopidogrel 75mg  Appropriate, Effective, Safe, Accessible Atorvastatin 20mg  Appropriate, Effective, Safe, Accessible -Medications previously tried: N/A  November 2022: Per [facility] and note, only on ASA 81mg . Patient said [facility] told him to take both, will let Cardio know and alert patient based on response Feb 2023: After recent procedures, back on DAPT per patient  Hyperlipidemia: (LDL goal < 70) The ASCVD Risk score (Arnett DK, et al., 2019) failed to calculate for the following reasons:   The valid total cholesterol range is 130 to 320 mg/dL Lab Results  Component Value Date   CHOL 127 12/13/2021   CHOL 111 06/17/2021   CHOL 88 (L) 01/12/2021   Lab Results  Component Value Date   HDL 29 (L) 12/13/2021   HDL 27 (L) 06/17/2021   HDL 27 (L) 01/12/2021   Lab Results  Component Value Date   LDLCALC 46 12/13/2021   LDLCALC 48 06/17/2021   LDLCALC 32 01/12/2021   Lab  Results  Component Value Date   TRIG 346 (H) 12/13/2021   TRIG 227 (H) 06/17/2021   TRIG 175 (H) 01/12/2021   Lab Results  Component Value Date   CHOLHDL 4.4 12/13/2021   CHOLHDL 4.1 06/17/2021   CHOLHDL 3.3 01/12/2021   Lab Results  Component Value Date   LDLDIRECT 113.0 12/20/2017   LDLDIRECT 110.0 06/07/2016    LDLDIRECT 118.9 03/06/2012  -Controlled -Current treatment: Atorvastatin 20mg  Appropriate, Effective, Safe, Accessible -Medications previously tried: N/A  -Current home readings: Doesn't test -Current dietary habits: "Tries to eat healthy" -Educated on Cholesterol goals;  November 2022: Patient was taking both statins, [facility] told him this month to only take Rosuvastatin, Cardio note has Atorvastatin. Sent msg to Cardio asking them to clarify August 2023: Recommend increase statin due to Trigs  Diabetes (A1c goal <8%) Lab Results  Component Value Date   HGBA1C 9.5 (H) 06/17/2021   HGBA1C 8.2 (H) 01/12/2021   HGBA1C 7.3 (H) 11/18/2019   Lab Results  Component Value Date   MICROALBUR 2.9 (H) 02/07/2017   LDLCALC 46 12/13/2021   CREATININE 0.93 12/13/2021   Lab Results  Component Value Date   NA 139 12/13/2021   K 4.0 12/13/2021   CREATININE 0.93 12/13/2021   EGFR 91 12/13/2021   GFRNONAA 90 11/18/2019   GLUCOSE 218 (H) 12/13/2021   Lab Results  Component Value Date   WBC 9.4 12/13/2021   HGB 11.7 (L) 12/13/2021   HCT 36.8 (L) 12/13/2021   MCV 91 12/13/2021   PLT 281 12/13/2021  -Uncontrolled -Current medications: Metformin 500mg  ER Appropriate, Query effective, Safe, Accessible Rybelsus 7mg  QD (PAP)  Appropriate, Query effective, Safe, Accessible Tresiba 30 units  -Medications previously tried: N/A  -Current home glucose readings fasting glucose: Didn't have list post prandial glucose: Doesn't test -Denies hypoglycemic/hyperglycemic symptoms -Current meal patterns: Didn't specify -Current exercise: None -Educated on Complications of diabetes including kidney damage, retinal damage, and cardiovascular disease; -Counseled to check feet daily and get yearly eye exams November 2022: Denied Hypoglycemia issues Feb 2023: Recommend increasing Metformin August 2023: Patient was experiencing hypoglycemia per [facility] note so we let Dr. Tobie Poet know. She asked patient get  seen ASAP for A1c and hypoglycemia evaluation. Saw PCP and he increased Tresiba from 10 units to 30 units. Sent direct msg to Dr. Tobie Poet regarding this. Pharmacist still recommends Metformin increase due to hypoglycemia risk -Rybelsus PAP missing signature, recommended he come in and sign it before end of week  Tobacco use (Goal Cessation) Tobacco Use: High Risk (12/13/2021)   Patient History    Smoking Tobacco Use: Every Day    Smokeless Tobacco Use: Never    Passive Exposure: Never   Social History   Tobacco Use  Smoking Status Every Day   Packs/day: 1.00   Years: 25.00   Total pack years: 25.00   Types: Cigarettes   Passive exposure: Never  Smokeless Tobacco Never  -Uncontrolled -Previous quit attempts:  -Current treatment  None -On a scale of 1-10, reports MOTIVATION to quit is 0 -On a scale of 1-10, reports CONFIDENCE in quitting is 0 -Provided contact information for [place] Quit Line (1-800-QUIT-NOW) and encouraged patient to reach out to this group for support. -Recommended to continue current medication   Meds: Verbal consent obtained for [facility] enhanced pharmacy services (medication synchronization, adherence packaging, delivery coordination). A medication sync plan was created to allow patient to get all medications delivered once every 30 to 90 days per patient preference. Patient understands they  have freedom to choose pharmacy and clinical pharmacist will coordinate care between all prescribers and [facility].    Patient Goals/Self-Care Activities Patient will:  - take medications as prescribed as evidenced by patient report and record review  Follow Up Plan: The patient has been provided with contact information for the care management team and has been advised to call with any health related questions or concerns.   Andrew Wiggins, Pharm.D. - [phone]   CPP F/U August 2023      Andrew Wiggins was given information about Chronic Care Management  services today including:  CCM service includes personalized support from designated clinical staff supervised by his physician, including individualized plan of care and coordination with other care providers 24/7 contact phone numbers for assistance for urgent and routine care needs. Standard insurance, coinsurance, copays and deductibles apply for chronic care management only during months in which we provide at least 20 minutes of these services. Most insurances cover these services at 100%, however patients may be responsible for any copay, coinsurance and/or deductible if applicable. This service may help you avoid the need for more expensive face-to-face services. Only one practitioner may furnish and bill the service in a calendar month. The patient may stop CCM services at any time (effective at the end of the month) by phone call to the office staff.  Patient agreed to services and verbal consent obtained.   The patient verbalized understanding of instructions, educational materials, and care plan provided today and DECLINED offer to receive copy of patient instructions, educational materials, and care plan.  The pharmacy team will reach out to the patient again over the next 30 days.   Lane Hacker, [place]

## 2021-12-21 NOTE — Progress Notes (Signed)
Chronic Care Management Pharmacy Note  12/21/2021 Name:  Andrew Wiggins MRN:  [id number] DOB:  1955-09-18  Summary: -Pleasant 66 year old male presents for initial CCM visit. Worked at [employer] for 24 years before they left. 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 Due to his disability, he is unable to go out to his garage and work   Subjective: Andrew Wiggins is an 66 y.o. year old male who is a primary patient of Andrew Wiggins, Andrew Comfort, MD.  The CCM team was consulted for assistance with disease management and care coordination needs.    Engaged with patient by telephone for follow up visit in response to provider referral for pharmacy case management and/or care coordination services.   Consent to Services:  The patient was given the following information about Chronic Care Management services today, agreed to services, and gave verbal consent: 1. CCM service includes personalized support from designated clinical staff supervised by the primary care provider, including individualized plan of care and coordination with other care providers 2. 24/7 contact phone numbers for assistance for urgent and routine care needs. 3. Service will only be billed when office clinical staff spend 20 minutes or more in a month to coordinate care. 4. Only one practitioner may furnish and bill the service in a calendar month. 5.The patient may stop CCM services at any time (effective at the end of the month) by phone call to the office staff. 6. The patient will be responsible for cost sharing (co-pay) of up to 20% of the service fee (after annual deductible is met). Patient agreed to services and consent obtained.  Patient Care Team: [name], MD as PCP - General (Family Medicine) [name], MD as PCP - Electrophysiology (Cardiology) Lane Hacker, [facility] (Pharmacist) Revankar,  Reita Cliche, MD as Consulting Physician (Cardiology)  Recent office visits:  01-26-2021 [name], MD. START rybelsus 7 mg daily.   01-12-2021 [name], MD. STOP plavix and rosuvastatin. START gabapentin 300 mg 3 times daily.    12-03-2020 [name], MD. STOP aspirin patient preference. STOP Voltaren.   Recent consult visits:  01-19-2021 Andrew Salines, DO (Cardiology). STOP amlodipine. Take atorvastatin 20 mg at night instead of morning. WBC= 11.7, neutrophils= 7.4, lymphocytes= 3.4. Glucose= 262. A1C= 8.2. Cholesterol= 88, trig= 175, HDL= 27.   12-29-2020 Andrew Amor, PA-C (Vascular and vein). Follow up in 6 months.   12-14-2020 Andrew Mitchell, MD. Abdominal aortogram w/ lower extremity procedure. START Aspirin 81 mg daily, rosuvastatin 10 mg daily, keflex 500 mg 4 times daily for 10 days, plavix 75 mg daily.   12-10-2020 Andrew Sandy, MD (Vascular and vein). Initial consult for surgery.   11-17-2020 Andrew Cha, MD. Unable to view encounter   10-20-2020 Andrew Cha, MD. Unable to view encounter   10-06-2020 Andrew Salines, DO (Cardiology). START atorvastatin 20 mg daily and HCTZ 12.5 mg daily.   09-28-2020 Andrew Curia, RN ([facility]). Pacemaker check.   09-22-2020 Andrew Cha, MD. Unable to view encounter.   09-08-2020 Andrew Grill, MD (Ophthalmology). Unable to view encounter.   08-25-2020 Andrew Cha, MD. Unable to view encounter.   08-09-2020 Andrew Grill, MD (Ophthalmology). Unable to view encounter.     Hospital visits:  None in previous 6 months   Objective:  Lab Results  Component Value Date   CREATININE  0.93 12/13/2021   BUN 10 12/13/2021   GFR 83.08 12/20/2017   GFRNONAA 90 11/18/2019   GFRAA 104 11/18/2019   NA 139 12/13/2021   K 4.0 12/13/2021   CALCIUM 10.1 12/13/2021   CO2 24 12/13/2021   GLUCOSE 218 (H) 12/13/2021    Lab Results  Component Value Date/Time   HGBA1C 9.5 (H)  06/17/2021 08:37 AM   HGBA1C 8.2 (H) 01/12/2021 11:00 AM   FRUCTOSAMINE 284 02/07/2017 02:03 PM   GFR 83.08 12/20/2017 01:20 PM   GFR 98.24 07/04/2017 12:36 PM   MICROALBUR 2.9 (H) 02/07/2017 02:03 PM    Last diabetic Eye exam:  Lab Results  Component Value Date/Time   HMDIABEYEEXA No Retinopathy 10/28/2021 12:00 AM    Last diabetic Foot exam: No results found for: "HMDIABFOOTEX"   Lab Results  Component Value Date   CHOL 127 12/13/2021   HDL 29 (L) 12/13/2021   LDLCALC 46 12/13/2021   LDLDIRECT 113.0 12/20/2017   TRIG 346 (H) 12/13/2021   CHOLHDL 4.4 12/13/2021       Latest Ref Rng & Units 12/13/2021    8:28 AM 06/17/2021    8:37 AM 01/12/2021   11:00 AM  Hepatic Function  Total Protein 6.0 - 8.5 g/dL 6.8  6.8  6.7   Albumin 3.9 - 4.9 g/dL 4.3  4.3  4.3   AST 0 - 40 IU/L _0 ALT 0 - 44 IU/L _1 Alk Phosphatase 44 - 121 IU/L 121  117  103   Total Bilirubin 0.0 - 1.2 mg/dL <0.2  0.3  0.3     Lab Results  Component Value Date/Time   TSH 1.46 12/20/2017 01:20 PM   TSH 1.20 06/07/2016 11:15 AM       Latest Ref Rng & Units 12/13/2021    8:28 AM 06/17/2021    8:37 AM 05/24/2021   10:05 AM  CBC  WBC 3.4 - 10.8 x10E3/uL 9.4  11.6    Hemoglobin 13.0 - 17.7 g/dL 11.7  14.7  15.3   Hematocrit 37.5 - 51.0 % 36.8  43.9  45.0   Platelets 150 - 450 x10E3/uL 281  229      No results found for: "VD25OH"  Clinical ASCVD: Yes  The ASCVD Risk score (Arnett DK, et al., 2019) failed to calculate for the following reasons:   The valid total cholesterol range is 130 to 320 mg/dL       01/26/2021   10:46 AM 01/12/2021   10:20 AM 06/16/2020    1:55 PM  Depression screen PHQ 2/9  Decreased Interest 0 0 0  Down, Depressed, Hopeless 0 0 0  PHQ - 2 Score 0 0 0     Other: (CHADS2VASc if Afib, MMRC or CAT for COPD, ACT, DEXA)  Social History   Tobacco Use  Smoking Status Every Day   Packs/day: 1.00   Years: 25.00   Total pack years: 25.00   Types: Cigarettes    Passive exposure: Never  Smokeless Tobacco Never   BP Readings from Last 3 Encounters:  12/13/21 132/74  09/19/21 (!) 158/63  08/29/21 (!) 174/76   Pulse Readings from Last 3 Encounters:  12/13/21 70  09/19/21 84  08/29/21 96   Wt Readings from Last 3 Encounters:  12/13/21 147 lb (66.7 kg)  09/19/21 148 lb 14.4 oz (67.5 kg)  08/29/21 147 lb (66.7 kg)   BMI Readings from Last 3 Encounters:  12/13/21  20.50 kg/m  09/19/21 21.36 kg/m  08/29/21 21.09 kg/m    Assessment/Interventions: Review of patient past medical history, allergies, medications, health status, including review of consultants reports, laboratory and other test data, was performed as part of comprehensive evaluation and provision of chronic care management services.   SDOH:  (Social Determinants of Health) assessments and interventions performed: Yes SDOH Interventions    Flowsheet Row Most Recent Value  SDOH Interventions   Financial Strain Interventions Intervention Not Indicated  Transportation Interventions Intervention Not Indicated      SDOH Screenings   Alcohol Screen: Low Risk  (06/16/2020)   Alcohol Screen    Last Alcohol Screening Score (AUDIT): 0  Depression (PHQ2-9): Low Risk  (01/26/2021)   Depression (PHQ2-9)    PHQ-2 Score: 0  Financial Resource Strain: Low Risk  (12/21/2021)   Overall Financial Resource Strain (CARDIA)    Difficulty of Paying Living Expenses: Not hard at all  Food Insecurity: No Food Insecurity (06/16/2020)   Hunger Vital Sign    Worried About Running Out of Food in the Last Year: Never true    [place] in the Last Year: Never true  Housing: Low Risk  (06/16/2020)   Housing    Last Housing Risk Score: 0  Physical Activity: Inactive (06/16/2020)   Exercise Vital Sign    Days of Exercise per Week: 0 days    Minutes of Exercise per Session: 30 min  Social Connections: Socially Isolated (06/16/2020)   Social Connection and Isolation Panel [NHANES]    Frequency of  Communication with Friends and Family: Three times a week    Frequency of Social Gatherings with Friends and Family: Never    Attends Religious Services: Never    Marine scientist or Organizations: No    Attends Archivist Meetings: Never    Marital Status: Divorced  Stress: Stress Concern Present (06/16/2020)   [employer] [place]    Feeling of Stress : To some extent  Tobacco Use: High Risk (12/13/2021)   Patient History    Smoking Tobacco Use: Every Day    Smokeless Tobacco Use: Never    Passive Exposure: Never  Transportation Needs: No Transportation Needs (12/21/2021)   PRAPARE - Transportation    Lack of Transportation (Medical): No    Lack of Transportation (Non-Medical): No    CCM Care Plan  No Known Allergies  Medications Reviewed Today     Reviewed by Lane Hacker, [facility] (Pharmacist) on 12/21/21 at [place] List Status: <None>   Medication Order Taking? Sig Documenting Provider Last Dose Status Informant  atorvastatin (LIPITOR) 20 MG tablet [id number] No Take 1 tablet (20 mg total) by mouth daily. [name], MD Taking Active   clopidogrel (PLAVIX) 75 MG tablet [id number] No Take 1 tablet (75 mg total) by mouth daily. [name], MD Taking Active   gabapentin (NEURONTIN) 300 MG capsule [id number] No Take 2 capsules (600 mg total) by mouth 3 (three) times daily. Take 2 capsules 3 times a day [name], MD Taking Active   glimepiride (AMARYL) 4 MG tablet [id number] No Take 1 tablet (4 mg total) by mouth daily. [name], MD Taking Active   hydrochlorothiazide (MICROZIDE) 12.5 MG capsule [id number] No Take 1 capsule (12.5 mg total) by mouth daily. [name], MD Taking Expired 09/15/21 2359   HYDROcodone-acetaminophen (NORCO) 10-325 MG tablet [id number] No Take 1 tablet by mouth 3 (three) times  daily. [provider] Taking Active Self   insulin degludec (TRESIBA FLEXTOUCH) 100 UNIT/ML FlexTouch Pen [id number] No Inject 10 Units into the skin daily. [name], MD Taking Active   lisinopril (ZESTRIL) 20 MG tablet [id number] No TAKE 1 TABLET BY MOUTH IN THE MORNING AND AT BEDTIME [name], MD Taking Active   metFORMIN (GLUCOPHAGE-XR) 500 MG 24 hr tablet [id number] No TAKE 1 TABLET BY MOUTH ONCE DAILY WITH EVENING MEAL [name], MD Taking Active   Semaglutide (RYBELSUS) 7 MG TABS [id number] No Take 7 mg by mouth daily. 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   GERD 03/26/2008   Essential hypertension 04/10/2007    Immunization History  Administered Date(s) Administered   Influenza Whole 04/15/2009   Influenza,inj,Quad PF,6+ Mos 06/07/2015, 06/07/2016   Pneumococcal Polysaccharide-23 04/15/2009    Conditions to be addressed/monitored:  Hypertension, Hyperlipidemia, and Diabetes  Care Plan : [place]  Updates made by Lane Hacker, [place] since 12/21/2021 12:00 AM     Problem: DM, HTN, Pain   Priority: High  Onset Date: 03/14/2021     Goal: Disease State Management   Start Date: 03/14/2021  Expected End Date: 03/14/2022  Recent Progress: On track  Priority: High  Note:   Current Barriers:  Unable to self administer medications as  prescribed  Pharmacist Clinical Goal(s):  Patient will contact provider office for questions/concerns as evidenced notation of same in electronic health record through collaboration with PharmD and provider.   Interventions: 1:1 collaboration with [name], MD regarding development and update of comprehensive plan of care as evidenced by provider attestation and co-signature Inter-disciplinary care team collaboration (see longitudinal plan of care) Comprehensive medication review performed; medication list updated in electronic medical record  Hypertension (BP goal <140/90) BP Readings from Last 3 Encounters:  12/13/21 132/74  09/19/21 (!) 158/63  08/29/21 (!) 174/76  -Controlled -Current treatment: Lisinopril 89m BID Appropriate, Effective, Safe, Accessible HCTZ 12.565mQD Appropriate, Effective, Safe, Accessible -Medications previously tried: Amlodipine  -Current home readings: Doesn't test -Current dietary habits: "Tries to eat healthy" -Current exercise habits: None -Denies hypotensive/hypertensive symptoms -Educated on BP goals and benefits of medications for prevention of heart attack, stroke and kidney damage; -Counseled to monitor BP at home as needed, document, and provide log at future appointments -Recommended to continue current medication  CAD The ASCVD Risk score (Arnett DK, et al., 2019) failed to calculate for the following reasons:   The valid total cholesterol range is 130 to 320 mg/dL Lab Results  Component Value Date   CHOL 127 12/13/2021   CHOL 111 06/17/2021   CHOL 88 (L) 01/12/2021   Lab Results  Component Value Date   HDL 29 (L) 12/13/2021   HDL 27 (L) 06/17/2021   HDL 27 (L) 01/12/2021  Lab Results  Component Value Date   LDLCALC 46 12/13/2021   LDLCALC 48 06/17/2021   LDLCALC 32 01/12/2021   Lab Results  Component Value Date   TRIG 346 (H) 12/13/2021   TRIG 227 (H) 06/17/2021   TRIG 175 (H) 01/12/2021   Lab Results   Component Value Date   CHOLHDL 4.4 12/13/2021   CHOLHDL 4.1 06/17/2021   CHOLHDL 3.3 01/12/2021   Lab Results  Component Value Date   LDLDIRECT 113.0 12/20/2017   LDLDIRECT 110.0 06/07/2016   LDLDIRECT 118.9 03/06/2012  -Unknown control -Current treatment  ASA 24m Appropriate, Effective, Safe, Accessible Clopidogrel 770mAppropriate, Effective, Safe, Accessible Atorvastatin 2037mppropriate, Effective, Safe, Accessible -Medications previously tried: N/A  November 2022: Per [facility] note, only on ASA 81m46matient said [facility] told him to take both, will let Cardio know and alert patient based on response Feb 2023: After recent procedures, back on DAPT per patient  Hyperlipidemia: (LDL goal < 70) The ASCVD Risk score (Arnett DK, et al., 2019) failed to calculate for the following reasons:   The valid total cholesterol range is 130 to 320 mg/dL Lab Results  Component Value Date   CHOL 127 12/13/2021   CHOL 111 06/17/2021   CHOL 88 (L) 01/12/2021   Lab Results  Component Value Date   HDL 29 (L) 12/13/2021   HDL 27 (L) 06/17/2021   HDL 27 (L) 01/12/2021   Lab Results  Component Value Date   LDLCALC 46 12/13/2021   LDLCALC 48 06/17/2021   LDLCALC 32 01/12/2021   Lab Results  Component Value Date   TRIG 346 (H) 12/13/2021   TRIG 227 (H) 06/17/2021   TRIG 175 (H) 01/12/2021   Lab Results  Component Value Date   CHOLHDL 4.4 12/13/2021   CHOLHDL 4.1 06/17/2021   CHOLHDL 3.3 01/12/2021   Lab Results  Component Value Date   LDLDIRECT 113.0 12/20/2017   LDLDIRECT 110.0 06/07/2016   LDLDIRECT 118.9 03/06/2012  -Controlled -Current treatment: Atorvastatin 20mg76mropriate, Effective, Safe, Accessible -Medications previously tried: N/A  -Current home readings: Doesn't test -Current dietary habits: "Tries to eat healthy" -Educated on Cholesterol goals;  November 2022: Patient was taking both statins, [facility] told him this month to only take Rosuvastatin,  Cardio note has Atorvastatin. Sent msg to Cardio asking them to clarify August 2023: Recommend increase statin due to Trigs  Diabetes (A1c goal <8%) Lab Results  Component Value Date   HGBA1C 9.5 (H) 06/17/2021   HGBA1C 8.2 (H) 01/12/2021   HGBA1C 7.3 (H) 11/18/2019   Lab Results  Component Value Date   MICROALBUR 2.9 (H) 02/07/2017   LDLCALC 46 12/13/2021   CREATININE 0.93 12/13/2021   Lab Results  Component Value Date   NA 139 12/13/2021   K 4.0 12/13/2021   CREATININE 0.93 12/13/2021   EGFR 91 12/13/2021   GFRNONAA 90 11/18/2019   GLUCOSE 218 (H) 12/13/2021   Lab Results  Component Value Date   WBC 9.4 12/13/2021   HGB 11.7 (L) 12/13/2021   HCT 36.8 (L) 12/13/2021   MCV 91 12/13/2021   PLT 281 12/13/2021  -Uncontrolled -Current medications: Metformin 500mg 16mppropriate, Query effective, Safe, Accessible Rybelsus 7mg QD45mAP)  Appropriate, Query effective, Safe, Accessible Tresiba 30 units  -Medications previously tried: N/A  -Current home glucose readings fasting glucose: Didn't have list post prandial glucose: Doesn't test -Denies hypoglycemic/hyperglycemic symptoms -Current meal patterns: Didn't specify -Current exercise: None -Educated on Complications of diabetes including kidney damage, retinal  damage, and cardiovascular disease; -Counseled to check feet daily and get yearly eye exams November 2022: Denied Hypoglycemia issues Feb 2023: Recommend increasing Metformin August 2023: Patient was experiencing hypoglycemia per Concierge note so we let Dr. Tobie Poet know. She asked patient get seen ASAP for A1c and hypoglycemia evaluation. Saw PCP and he increased Tresiba from 10 units to 30 units. Sent direct msg to Dr. Tobie Poet regarding this. Pharmacist still recommends Metformin increase due to hypoglycemia risk -Rybelsus PAP missing signature, recommended he come in and sign it before end of week  Tobacco use (Goal Cessation) Tobacco Use: High Risk (12/13/2021)    Patient History    Smoking Tobacco Use: Every Day    Smokeless Tobacco Use: Never    Passive Exposure: Never   Social History   Tobacco Use  Smoking Status Every Day   Packs/day: 1.00   Years: 25.00   Total pack years: 25.00   Types: Cigarettes   Passive exposure: Never  Smokeless Tobacco Never  -Uncontrolled -Previous quit attempts:  -Current treatment  None -On a scale of 1-10, reports MOTIVATION to quit is 0 -On a scale of 1-10, reports CONFIDENCE in quitting is 0 -Provided contact information for  Quit Line (1-800-QUIT-NOW) and encouraged patient to reach out to this group for support. -Recommended to continue current medication   Meds: Verbal consent obtained for [facility] enhanced pharmacy services (medication synchronization, adherence packaging, delivery coordination). A medication sync plan was created to allow patient to get all medications delivered once every 30 to 90 days per patient preference. Patient understands they have freedom to choose pharmacy and clinical pharmacist will coordinate care between all prescribers and [facility].    Patient Goals/Self-Care Activities Patient will:  - take medications as prescribed as evidenced by patient report and record review  Follow Up Plan: The patient has been provided with contact information for the care management team and has been advised to call with any health related questions or concerns.   [place] Constable, Pharm.D. - [phone]   CPP F/U August 2023       Medication Assistance: None required.  Patient affirms current coverage meets needs.  Compliance/Adherence/Medication fill history:  Patient's preferred pharmacy is:  [facility] [address], [place] [address] Phone: [phone] Fax: [phone]  [facility] - [place], [place] - [address] [place] [place]  [address] Phone: [phone] Fax: [phone]   Uses pill box? No -   Pt endorses 50% compliance  We discussed: Benefits of medication synchronization, packaging and delivery as well as enhanced pharmacist oversight with [facility]. Patient decided to: Utilize [facility] for medication synchronization, packaging and delivery Verbal consent obtained for [facility] enhanced pharmacy services (medication synchronization, adherence packaging, delivery coordination). A medication sync plan was created to allow patient to get all medications delivered once every 30 to 90 days per patient preference. Patient understands they have freedom to choose pharmacy and clinical pharmacist will coordinate care between all prescribers and [facility].   Care Plan and Follow Up Patient Decision:  Patient agrees to Care Plan and Follow-up.  Plan: The patient has been provided with contact information for the care management team and has been advised to call with any health related questions or concerns.   [place] Constable, Pharm.D. - [phone]   CPP F/U Feb 2024

## 2021-12-22 ENCOUNTER — Other Ambulatory Visit: Payer: Self-pay | Admitting: Family Medicine

## 2021-12-22 DIAGNOSIS — E782 Mixed hyperlipidemia: Secondary | ICD-10-CM

## 2021-12-22 DIAGNOSIS — E1121 Type 2 diabetes mellitus with diabetic nephropathy: Secondary | ICD-10-CM

## 2021-12-22 DIAGNOSIS — I10 Essential (primary) hypertension: Secondary | ICD-10-CM

## 2021-12-22 DIAGNOSIS — D649 Anemia, unspecified: Secondary | ICD-10-CM

## 2021-12-22 NOTE — Progress Notes (Signed)
Direct msg from Dr. Sedalia Muta changing long acting insulin to 5 units based on Hx of hypoglycemia. Patient will come in tomorrow to get A1c and sign PAP paperwork

## 2021-12-23 ENCOUNTER — Other Ambulatory Visit: Payer: PPO

## 2021-12-23 DIAGNOSIS — E1121 Type 2 diabetes mellitus with diabetic nephropathy: Secondary | ICD-10-CM | POA: Diagnosis not present

## 2021-12-23 DIAGNOSIS — D649 Anemia, unspecified: Secondary | ICD-10-CM | POA: Diagnosis not present

## 2021-12-24 LAB — IRON,TIBC AND FERRITIN PANEL
Ferritin: 18 ng/mL — ABNORMAL LOW (ref 30–400)
Iron Saturation: 7 % — CL (ref 15–55)
Iron: 28 ug/dL — ABNORMAL LOW (ref 38–169)
Total Iron Binding Capacity: 413 ug/dL (ref 250–450)
UIBC: 385 ug/dL — ABNORMAL HIGH (ref 111–343)

## 2021-12-24 LAB — CBC WITH DIFFERENTIAL/PLATELET
Basophils Absolute: 0 10*3/uL (ref 0.0–0.2)
Basos: 0 %
EOS (ABSOLUTE): 0.2 10*3/uL (ref 0.0–0.4)
Eos: 2 %
Hematocrit: 34.6 % — ABNORMAL LOW (ref 37.5–51.0)
Hemoglobin: 11 g/dL — ABNORMAL LOW (ref 13.0–17.7)
Immature Grans (Abs): 0.1 10*3/uL (ref 0.0–0.1)
Immature Granulocytes: 1 %
Lymphocytes Absolute: 3.2 10*3/uL — ABNORMAL HIGH (ref 0.7–3.1)
Lymphs: 30 %
MCH: 28.9 pg (ref 26.6–33.0)
MCHC: 31.8 g/dL (ref 31.5–35.7)
MCV: 91 fL (ref 79–97)
Monocytes Absolute: 1.1 10*3/uL — ABNORMAL HIGH (ref 0.1–0.9)
Monocytes: 10 %
Neutrophils Absolute: 6.1 10*3/uL (ref 1.4–7.0)
Neutrophils: 57 %
Platelets: 278 10*3/uL (ref 150–450)
RBC: 3.81 x10E6/uL — ABNORMAL LOW (ref 4.14–5.80)
RDW: 13.2 % (ref 11.6–15.4)
WBC: 10.6 10*3/uL (ref 3.4–10.8)

## 2021-12-24 LAB — HEMOGLOBIN A1C
Est. average glucose Bld gHb Est-mCnc: 200 mg/dL
Hgb A1c MFr Bld: 8.6 % — ABNORMAL HIGH (ref 4.8–5.6)

## 2021-12-24 LAB — MICROALBUMIN / CREATININE URINE RATIO
Creatinine, Urine: 94.9 mg/dL
Microalb/Creat Ratio: 19 mg/g creat (ref 0–29)
Microalbumin, Urine: 17.8 ug/mL

## 2021-12-26 ENCOUNTER — Ambulatory Visit (HOSPITAL_COMMUNITY)
Admission: RE | Admit: 2021-12-26 | Discharge: 2021-12-26 | Disposition: A | Discharge status: Home / Self Care | Payer: PPO | Source: Ambulatory Visit | Attending: Surgery | Admitting: Surgery

## 2021-12-26 ENCOUNTER — Ambulatory Visit (INDEPENDENT_AMBULATORY_CARE_PROVIDER_SITE_OTHER)
Admission: RE | Admit: 2021-12-26 | Discharge: 2021-12-26 | Disposition: A | Discharge status: Home / Self Care | Payer: PPO | Source: Ambulatory Visit | Attending: Surgery | Admitting: Surgery

## 2021-12-26 ENCOUNTER — Other Ambulatory Visit: Payer: Self-pay

## 2021-12-26 ENCOUNTER — Encounter: Payer: Self-pay | Admitting: Surgery

## 2021-12-26 ENCOUNTER — Ambulatory Visit: Payer: PPO | Admitting: Surgery

## 2021-12-26 VITALS — BP 181/74 | HR 85 | Temp 98.5°F | Resp 20 | Ht 71.0 in | Wt 146.0 lb

## 2021-12-26 DIAGNOSIS — I70222 Atherosclerosis of native arteries of extremities with rest pain, left leg: Secondary | ICD-10-CM

## 2021-12-26 DIAGNOSIS — I739 Peripheral vascular disease, unspecified: Secondary | ICD-10-CM

## 2021-12-26 MED ORDER — METFORMIN HCL 1000 MG PO TABS: 1000.0000 mg | ORAL_TABLET | Freq: Two times a day (BID) | ORAL | 1 refills | Status: DC

## 2021-12-26 NOTE — Progress Notes (Signed)
Vascular and Vein Specialist of [facility]  Patient name: Andrew Wiggins MRN: [id number] DOB: 11/11/1955 Sex: male   REASON FOR VISIT:    Follow up  HISOTRY OF PRESENT ILLNESS:    Andrew Wiggins is a 66 y.o. male who has undergone the following:  August 2022: Left superficial femoral artery stenting for a toe ulcer January 2023, left SFA DCB for rest pain  He is currently seeing podiatry in [place] for left fourth toe gangrene.  Autoamputation has been the plan.  Patient is a current smoker.  He has COPD.  He takes a statin for hypercholesterolemia.  He is medically managed for hypertension.  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 ~  Jan11:  Last seen by Cards & pacer reprogrammed...     Cardiac pacemaker in situ    Carotid artery disease (HCC) 07/27/2010   R/O PERIPHERAL VASCULAR DISEASE (ICD-443.9) - on ASA 81mg /d... exam 11/09 shows gr 1-2 sys murmur LSB, but also has prominent bruit to base of right side of his neck w/ right > left Carotid Bruit... ~  CDopplers 11/09 showed severe plaque right ICA, & min plaque on left... no signif ICA stenoses per report. 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 smokes 1/2 - 1ppd x yrs... ~  PFT's 11/08 showed FVC= 3.85 (79%), FEV1= 2.19 (55%), %1sec= 57, mid-flows= 32% predicted:  all c/w mod airflow obstruction...  ~  CXR 12/10  showed left subclav pacer, mild hyperinflation, NAD...     Dermatitis, contact    due to poison ivy   Diabetes mellitus with neuropathy (HCC) 12/20/2016   Dyslipidemia 03/06/2012   Essential hypertension 04/10/2007    HYPERTENSION (ICD-401.9) - prev on Micardis 80mg /d then switched to Losartan 100mg /d, but he lost his job in 2011& couldn't afford Rx;  He called 14/07/2006 2/12 w/ this news & we phoned in Lisinopril 20mg /d... ~  2DEcho 11/09 showed sl incr LVwall thickness, norm LVF w/ EF= 55-60%, no regional wall motion abn, mild DD, mild MR... ~  Myoview 12/09 showed LBBB, no perfusion defects, abn septal motion & EF   Gangrene of toe (HCC) 06/19/2021   GERD 03/26/2008    GERD (ICD-530.81) - prev mild GERD symptoms that improved after cholecystectomy on 2000... uses OTC PPI as needed... ~  he is due for routine screening colonoscopy...       GERD (gastroesophageal reflux disease)    Headache(784.0)    Hypertension    Mixed hyperlipidemia 06/19/2019   Peripheral vascular disease (HCC)    Sciatica 11/18/2019   Tobacco use disorder      FAMILY HISTORY:   Family History  Problem Relation Age of Onset   Diabetes Brother     SOCIAL HISTORY:   Social History   Tobacco Use   Smoking status: Every Day    Packs/day: 0.75    Years: 25.00    Total pack years: 18.75  Types: Cigarettes    Passive exposure: Never   Smokeless tobacco: Never  Substance Use Topics   Alcohol use: Not Currently    Alcohol/week: 0.0 standard drinks of alcohol     ALLERGIES:   No Known Allergies   CURRENT MEDICATIONS:   Current Outpatient Medications  Medication Sig Dispense Refill   atorvastatin (LIPITOR) 20 MG tablet Take 1 tablet (20 mg total) by mouth daily. 90 tablet 1   clopidogrel (PLAVIX) 75 MG tablet Take 1 tablet (75 mg total) by mouth daily. 90 tablet 1   gabapentin (NEURONTIN) 300 MG capsule Take 2 capsules (600 mg total) by mouth 3 (three) times daily. Take 2 capsules 3 times a day 270 capsule 1    glimepiride (AMARYL) 4 MG tablet Take 1 tablet (4 mg total) by mouth daily. 90 tablet 1   HYDROcodone-acetaminophen (NORCO) 10-325 MG tablet Take 1 tablet by mouth 3 (three) times daily.     insulin degludec (TRESIBA FLEXTOUCH) 100 UNIT/ML FlexTouch Pen Inject 10 Units into the skin daily. (Patient taking differently: Inject 5 Units into the skin daily. Per verbal Dr. Sedalia Muta on 12/22/21) 6 mL 3   lisinopril (ZESTRIL) 20 MG tablet TAKE 1 TABLET BY MOUTH IN THE MORNING AND AT BEDTIME 180 tablet 1   metFORMIN (GLUCOPHAGE-XR) 500 MG 24 hr tablet TAKE 1 TABLET BY MOUTH ONCE DAILY WITH EVENING MEAL 90 tablet 1   Semaglutide (RYBELSUS) 7 MG TABS Take 7 mg by mouth daily. 90 tablet 1   hydrochlorothiazide (MICROZIDE) 12.5 MG capsule Take 1 capsule (12.5 mg total) by mouth daily. 90 capsule 1   No current facility-administered medications for this visit.    REVIEW OF SYSTEMS:   [X]  denotes positive finding, [ ]  denotes negative finding Cardiac  Comments:  Chest pain or chest pressure:    Shortness of breath upon exertion:    Short of breath when lying flat:    Irregular heart rhythm:        Vascular    Pain in calf, thigh, or hip brought on by ambulation:    Pain in feet at night that wakes you up from your sleep:     Blood clot in your veins:    Leg swelling:         Pulmonary    Oxygen at home:    Productive cough:     Wheezing:         Neurologic    Sudden weakness in arms or legs:     Sudden numbness in arms or legs:     Sudden onset of difficulty speaking or slurred speech:    Temporary loss of vision in one eye:     Problems with dizziness:         Gastrointestinal    Blood in stool:     Vomited blood:         Genitourinary    Burning when urinating:     Blood in urine:        Psychiatric    Major depression:         Hematologic    Bleeding problems:    Problems with blood clotting too easily:        Skin    Rashes or ulcers:        Constitutional    Fever or  chills:      PHYSICAL EXAM:   Vitals:   12/26/21 1359  BP: (!) 181/74  Pulse: 85  Resp: 20  Temp: 98.5  F (36.9 C)  SpO2: 96%  Weight: 146 lb (66.2 kg)  Height: 5\' 11"  (1.803 m)    GENERAL: The patient is a well-nourished male, in no acute distress. The vital signs are documented above. CARDIAC: There is a regular rate and rhythm.  VASCULAR: Nonpalpable pedal pulses PULMONARY: Non-labored respirations ABDOMEN: Soft and non-tender with normal pitched bowel sounds.  MUSCULOSKELETAL: Left fourth toe has artery amputated NEUROLOGIC: No focal weakness or paresthesias are detected. SKIN: There are no ulcers or rashes noted. PSYCHIATRIC: The patient has a normal affect.  STUDIES:   I have reviewed his vascular studies of the following findings: ABI/TBIToday's ABIToday's TBIPrevious ABIPrevious TBI  +-------+-----------+-----------+------------+------------+  Right  0.79       0.37       0.73        0.36          +-------+-----------+-----------+------------+------------+  Left   0.62       0.27       0.54        0.33          +-------+-----------+-----------+------------+------------+  Right toe pressure: 65 Left toe pressure: 4    Left: Patent left SFA stent.  30-49% stenosis in the distal CFA and proximal PFA.  50-74% stenisis in the proximal SFA.   MEDICAL ISSUES:   The gangrenous changes to the left fourth toe have a lot of amputated.  His ABIs and duplex are essentially the same.  I will bring him back in 3 months for repeat duplex.  Any significant change in his ultrasound findings will necessitate repeat angiography.  I did discuss that due to the small size of his vessels he is at risk for progressive narrowing and possible occlusion.  Have also stressed the importance of smoking cessation.    , MD, FACS [facility] [phone] Pager [phone]

## 2021-12-28 ENCOUNTER — Other Ambulatory Visit: Payer: Self-pay | Admitting: Family Medicine

## 2021-12-28 DIAGNOSIS — D500 Iron deficiency anemia secondary to blood loss (chronic): Secondary | ICD-10-CM

## 2022-01-02 ENCOUNTER — Other Ambulatory Visit: Payer: Self-pay | Admitting: Legal Medicine

## 2022-01-02 DIAGNOSIS — E0840 Diabetes mellitus due to underlying condition with diabetic neuropathy, unspecified: Secondary | ICD-10-CM

## 2022-01-03 ENCOUNTER — Telehealth: Payer: Self-pay

## 2022-01-03 DIAGNOSIS — M5137 Other intervertebral disc degeneration, lumbosacral region: Secondary | ICD-10-CM | POA: Diagnosis not present

## 2022-01-03 DIAGNOSIS — G894 Chronic pain syndrome: Secondary | ICD-10-CM | POA: Diagnosis not present

## 2022-01-03 DIAGNOSIS — Z1389 Encounter for screening for other disorder: Secondary | ICD-10-CM | POA: Diagnosis not present

## 2022-01-03 DIAGNOSIS — M48061 Spinal stenosis, lumbar region without neurogenic claudication: Secondary | ICD-10-CM | POA: Diagnosis not present

## 2022-01-03 DIAGNOSIS — M5136 Other intervertebral disc degeneration, lumbar region: Secondary | ICD-10-CM | POA: Diagnosis not present

## 2022-01-03 DIAGNOSIS — Z79891 Long term (current) use of opiate analgesic: Secondary | ICD-10-CM | POA: Diagnosis not present

## 2022-01-03 DIAGNOSIS — M25552 Pain in left hip: Secondary | ICD-10-CM | POA: Diagnosis not present

## 2022-01-03 DIAGNOSIS — M47816 Spondylosis without myelopathy or radiculopathy, lumbar region: Secondary | ICD-10-CM | POA: Diagnosis not present

## 2022-01-03 DIAGNOSIS — M549 Dorsalgia, unspecified: Secondary | ICD-10-CM | POA: Diagnosis not present

## 2022-01-03 NOTE — Progress Notes (Unsigned)
    Chronic Care Management Pharmacy Assistant   Name: Andrew Wiggins  MRN: [id number] DOB: 30-Aug-1955   Reason for Encounter: Medication Coordination for [facility]    Recent office visits:  8/17/213 Blane Ohara MD. Orders Only. Recommend patient start on iron sulfate 325 mg daily. Recommend increase Rybelsus to 14 mg daily.  Recommend increase metformin to 1000 mg twice daily.   Recent consult visits:  12/26/21 (Vas Surg) Coral Else MD. Seen for Limb Ischemia. No med changes.  Hospital visits:  None  Medications: Outpatient Encounter Medications as of 01/03/2022  Medication Sig   atorvastatin (LIPITOR) 20 MG tablet Take 1 tablet (20 mg total) by mouth daily.   clopidogrel (PLAVIX) 75 MG tablet Take 1 tablet (75 mg total) by mouth daily.   gabapentin (NEURONTIN) 300 MG capsule TAKE TWO CAPSULES BY MOUTH THREE TIMES DAILY   glimepiride (AMARYL) 4 MG tablet Take 1 tablet (4 mg total) by mouth daily.   hydrochlorothiazide (MICROZIDE) 12.5 MG capsule Take 1 capsule (12.5 mg total) by mouth daily.   HYDROcodone-acetaminophen (NORCO) 10-325 MG tablet Take 1 tablet by mouth 3 (three) times daily.   insulin degludec (TRESIBA FLEXTOUCH) 100 UNIT/ML FlexTouch Pen Inject 10 Units into the skin daily. (Patient taking differently: Inject 5 Units into the skin daily. Per verbal Dr. Sedalia Muta on 12/22/21)   lisinopril (ZESTRIL) 20 MG tablet TAKE 1 TABLET BY MOUTH IN THE MORNING AND AT BEDTIME   metFORMIN (GLUCOPHAGE) 1000 MG tablet Take 1 tablet (1,000 mg total) by mouth 2 (two) times daily with a meal.   Semaglutide (RYBELSUS) 7 MG TABS Take 7 mg by mouth daily.   No facility-administered encounter medications on file as of 01/03/2022.    Reviewed chart for medication changes ahead of medication coordination call.  No  hospital visits since last care coordination call/Pharmacist visit.   BP Readings from Last 3 Encounters:  12/26/21 (!) 181/74  12/13/21 132/74  09/19/21 (!) 158/63    Lab  Results  Component Value Date   HGBA1C 8.6 (H) 12/23/2021     Patient obtains medications through Adherence Packaging  30 Days   Last adherence delivery included: Aspirin 81mg  1 at EM Atorvastatin 20mg  1 at EM HCTZ 12.5mg  1 at B Lisinopril 20mg  1 at B 1 at EM Metformin 500mg  1 at EM Clopidogrel 75mg  1 at B Gabapentin 300mg  2 at B, 2 at L, 2 at EM  Patient declined (meds) last month  Glimepiride 4mg -Filled at [facility] on 11/21/21 90ds Flextouch-This has been stopped due to causing sugars too low  Patient is due for next adherence delivery on: 01/13/22. Called patient and reviewed medications and coordinated delivery.  This delivery to include: Aspirin 81mg  1 at EM Atorvastatin 20mg  1 at EM HCTZ 12.5mg  1 at B Lisinopril 20mg  1 at B 1 at EM Metformin 1000mg  1 at B 1 at EM Clopidogrel 75mg  1 at B Gabapentin 300mg  2 at B, 2 at L, 2 at EM  Patient declined the following medications  Unable to reach pt   Patient needs refills  Gabapentin 300mg   Delivery not confirmed, unable to reach pt    , [facility] Clinical Pharmacist Assistant  [phone]

## 2022-01-05 ENCOUNTER — Other Ambulatory Visit: Payer: Self-pay

## 2022-01-05 DIAGNOSIS — I10 Essential (primary) hypertension: Secondary | ICD-10-CM | POA: Diagnosis not present

## 2022-01-05 DIAGNOSIS — I2510 Atherosclerotic heart disease of native coronary artery without angina pectoris: Secondary | ICD-10-CM

## 2022-01-05 DIAGNOSIS — E782 Mixed hyperlipidemia: Secondary | ICD-10-CM | POA: Diagnosis not present

## 2022-01-05 DIAGNOSIS — I739 Peripheral vascular disease, unspecified: Secondary | ICD-10-CM

## 2022-01-05 DIAGNOSIS — M545 Low back pain, unspecified: Secondary | ICD-10-CM | POA: Diagnosis not present

## 2022-01-05 DIAGNOSIS — E1159 Type 2 diabetes mellitus with other circulatory complications: Secondary | ICD-10-CM | POA: Diagnosis not present

## 2022-01-05 DIAGNOSIS — F17210 Nicotine dependence, cigarettes, uncomplicated: Secondary | ICD-10-CM

## 2022-01-05 DIAGNOSIS — M25552 Pain in left hip: Secondary | ICD-10-CM | POA: Diagnosis not present

## 2022-01-05 DIAGNOSIS — M47816 Spondylosis without myelopathy or radiculopathy, lumbar region: Secondary | ICD-10-CM | POA: Diagnosis not present

## 2022-01-06 ENCOUNTER — Telehealth: Payer: Self-pay

## 2022-01-06 NOTE — Chronic Care Management (AMB) (Signed)
Faxed completed [employer] patient assistance application for Rybelsus 7 mg.  Billee Cashing, CMA Clinical Pharmacist Assistant [phone]

## 2022-01-16 ENCOUNTER — Ambulatory Visit: Payer: PPO | Admitting: Podiatry

## 2022-01-17 ENCOUNTER — Encounter: Payer: Self-pay | Admitting: Cardiology

## 2022-01-17 ENCOUNTER — Ambulatory Visit: Payer: PPO | Attending: Cardiology | Admitting: Cardiology

## 2022-01-17 VITALS — BP 140/52 | HR 103 | Ht 70.0 in | Wt 148.2 lb

## 2022-01-17 DIAGNOSIS — E1169 Type 2 diabetes mellitus with other specified complication: Secondary | ICD-10-CM | POA: Diagnosis not present

## 2022-01-17 DIAGNOSIS — I739 Peripheral vascular disease, unspecified: Secondary | ICD-10-CM | POA: Diagnosis not present

## 2022-01-17 DIAGNOSIS — M1612 Unilateral primary osteoarthritis, left hip: Secondary | ICD-10-CM | POA: Diagnosis not present

## 2022-01-17 DIAGNOSIS — I442 Atrioventricular block, complete: Secondary | ICD-10-CM | POA: Diagnosis not present

## 2022-01-17 DIAGNOSIS — F17210 Nicotine dependence, cigarettes, uncomplicated: Secondary | ICD-10-CM

## 2022-01-17 DIAGNOSIS — E782 Mixed hyperlipidemia: Secondary | ICD-10-CM | POA: Diagnosis not present

## 2022-01-17 DIAGNOSIS — Z950 Presence of cardiac pacemaker: Secondary | ICD-10-CM | POA: Diagnosis not present

## 2022-01-17 MED ORDER — FISH OIL 1000 MG PO CAPS: 2.0000 | ORAL_CAPSULE | Freq: Two times a day (BID) | ORAL | 12 refills | Status: DC

## 2022-01-17 NOTE — Progress Notes (Signed)
Cardiology Office Note:    Date:  01/17/2022   ID:  Andrew Wiggins, Andrew Wiggins 1955/09/23, MRN [id number]  PCP:  [name], MD  Cardiologist:  Jenean Lindau, MD   Referring MD: [name],*    ASSESSMENT:    1. PVD (peripheral vascular disease) ([place])   2. Complete AV block ([place])   3. Type 2 diabetes mellitus with other specified complication, without long-term current use of insulin (HCC)   4. Cardiac pacemaker in situ   5. Mixed hyperlipidemia    PLAN:    In order of problems listed above:  Arteriosclerotic vascular disease: Secondary prevention stressed with the patient.  Importance of compliance with diet and medication stressed any vocalized understanding. Peripheral vascular disease: Followed by vascular colleagues.  History and walk some on a regular basis. History of dyslipidemia: On statin therapy.  Triglycerides are elevated andhemoglobin A1c.  I discussed diet extensively.  I have initiated him on fish oil 2 g twice daily.  Lipids will be followed by primary care. Diabetes mellitus: Uncontrolled: Diet was emphasized.  Risks of uncontrolled type [place] visited.  This is followed by primary care. Post permanent pacemaker: Followed by electrophysiology colleagues and notes were reviewed. Cigarette smoker: I spent 5 minutes with the patient discussing solely about smoking. Smoking cessation was counseled. I suggested to the patient also different medications and pharmacological interventions. Patient is keen to try stopping on its own at this time. He will get back to me if he needs any further assistance in this matter. Patient will be seen in follow-up appointment in 9 months or earlier if the patient has any concerns    Medication Adjustments/Labs and Tests Ordered: Current medicines are reviewed at length with the patient today.  Concerns regarding medicines are outlined above.  Orders Placed This Encounter  Procedures   EKG 12-Lead   Meds  ordered this encounter  Medications   Omega-3 Fatty Acids (FISH OIL) 1000 MG CAPS    Sig: Take 2 capsules (2,000 mg total) by mouth 2 (two) times daily.    Dispense:  180 capsule    Refill:  12     No chief complaint on file.    History of Present Illness:    Andrew Wiggins is a 66 y.o. male.  Patient has past medical history of AV block post permanent pacemaker, carotid artery disease and peripheral vascular disease and COPD.  His history of essential hypertension dyslipidemia diabetes mellitus unfortunately continues to smoke.  He is being seen by me for the first time.  I do know him from before.  He is seeing other physicians in my practice.  At the time of my evaluation, the patient is alert awake oriented and in no distress.  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 ~  Jan11:  Last seen by Cards & pacer reprogrammed...     Cardiac pacemaker in situ    Carotid artery disease ([place]) 07/27/2010   R/O PERIPHERAL VASCULAR DISEASE (ICD-443.9) - on ASA 81mg /d... exam 11/09 shows gr 1-2 sys murmur LSB, but also has prominent bruit to base of right side of his neck w/ right > left Carotid Bruit... ~  CDopplers 11/09 showed  severe plaque right ICA, & min plaque on left... no signif ICA stenoses per report. 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 smokes 1/2 - 1ppd x yrs... ~  PFT's 11/08 showed FVC= 3.85 (79%), FEV1= 2.19 (55%), %1sec= 57, mid-flows=  32% predicted:  all c/w mod airflow obstruction...  ~  CXR 12/10 showed left subclav pacer, mild hyperinflation, NAD...     Dermatitis, contact    due to poison ivy   Diabetes mellitus (HCC) 02/04/2020   Diabetes mellitus with neuropathy (HCC) 12/20/2016   Dyslipidemia 03/06/2012   Essential hypertension 04/10/2007    HYPERTENSION (ICD-401.9) - prev on Micardis 80mg /d then switched to Losartan 100mg /d, but he lost his job in 2011& couldn't afford Rx;  He called 2/12 w/ this news & we phoned in Lisinopril 20mg /d... ~  2DEcho 11/09 showed sl incr LVwall thickness, norm LVF w/ EF= 55-60%, no regional wall motion abn, mild DD, mild MR... ~  Myoview 12/09 showed LBBB, no perfusion defects, abn septal motion & EF   GERD 03/26/2008    GERD (ICD-530.81) - prev mild GERD symptoms that improved after cholecystectomy on 2000... uses OTC PPI as needed... ~  he is due for routine screening colonoscopy...       Mixed hyperlipidemia 06/19/2019   PVD (peripheral vascular disease) (HCC)    Sciatica 11/18/2019   Tobacco use 02/04/2020   Tobacco use disorder     Past Surgical History:  Procedure Laterality Date   ABDOMINAL AORTOGRAM W/LOWER EXTREMITY N/A 12/14/2020   Procedure: ABDOMINAL AORTOGRAM W/LOWER EXTREMITY;  Surgeon: 11/20/2019, MD;  Location: [facility];  Service: Cardiovascular;  Laterality: N/A;   ABDOMINAL AORTOGRAM W/LOWER EXTREMITY N/A 05/24/2021   Procedure: ABDOMINAL AORTOGRAM W/LOWER EXTREMITY;  Surgeon: 02/13/2021, MD;  Location: [facility];  Service: Cardiovascular;  Laterality: N/A;   laproscopic cholecystectomy  2000   by Dr. Nada Libman   PACEMAKER INSERTION  03/2008   by Dr. Nada Libman   PERIPHERAL VASCULAR INTERVENTION Left 12/14/2020   Procedure: PERIPHERAL VASCULAR INTERVENTION;  Surgeon: 04/2008, MD;  Location: [facility];  Service: Cardiovascular;  Laterality: Left;  SFA   PERIPHERAL VASCULAR INTERVENTION Left 05/24/2021   Procedure:  PERIPHERAL VASCULAR INTERVENTION;  Surgeon: 02/13/2021, MD;  Location: [facility];  Service: Cardiovascular;  Laterality: Left;   PPM GENERATOR CHANGEOUT N/A 05/25/2021   Procedure: PPM GENERATOR CHANGEOUT;  Surgeon: 05/26/2021, MD;  Location: [facility];  Service: Cardiovascular;  Laterality: N/A;    Current Medications: Current Meds  Medication Sig   atorvastatin (LIPITOR) 20 MG tablet Take 1 tablet (20 mg total) by mouth daily.   clopidogrel (PLAVIX) 75 MG tablet Take 1 tablet (75 mg total) by mouth daily.   gabapentin (NEURONTIN) 300 MG capsule TAKE TWO CAPSULES BY MOUTH THREE TIMES DAILY   glimepiride (AMARYL) 4 MG tablet Take 1 tablet (4 mg total) by mouth daily.   hydrochlorothiazide (MICROZIDE) 12.5 MG capsule Take 12.5 mg by mouth daily.   HYDROcodone-acetaminophen (NORCO) 10-325 MG tablet Take 1 tablet by mouth 3 (three) times daily.   lisinopril (ZESTRIL) 20 MG tablet TAKE 1 TABLET BY MOUTH IN THE MORNING  AND AT BEDTIME   metFORMIN (GLUCOPHAGE) 1000 MG tablet Take 1 tablet (1,000 mg total) by mouth 2 (two) times daily with a meal.   Omega-3 Fatty Acids (FISH OIL) 1000 MG CAPS Take 2 capsules (2,000 mg total) by mouth 2 (two) times daily.     Allergies:   Patient has no known allergies.   Social History   Socioeconomic History   Marital status: Legally Separated    Spouse name: Not on file   Number of children: 2   Years of education: Not on file   Highest education level: Not on file  Occupational History   Occupation: unemployed    Comment: works for himself  Tobacco Use   Smoking status: Every Day    Packs/day: 0.75    Years: 25.00    Total pack years: 18.75    Types: Cigarettes    Passive exposure: Never   Smokeless tobacco: Never  Vaping Use   Vaping Use: Never used  Substance and Sexual Activity   Alcohol use: Not Currently    Alcohol/week: 0.0 standard drinks of alcohol   Drug use: No   Sexual activity: Not Currently   Other Topics Concern   Not on file  Social History Narrative   2 siblings died with leukemia around age 61   Social Determinants of Health   Financial Resource Strain: Low Risk  (12/21/2021)   Overall Financial Resource Strain (CARDIA)    Difficulty of Paying Living Expenses: Not hard at all  Food Insecurity: No Food Insecurity (06/16/2020)   Hunger Vital Sign    Worried About Running Out of Food in the Last Year: Never true    [place] in the Last Year: Never true  Transportation Needs: No Transportation Needs (12/21/2021)   PRAPARE - Hydrologist (Medical): No    Lack of Transportation (Non-Medical): No  Physical Activity: Inactive (06/16/2020)   Exercise Vital Sign    Days of Exercise per Week: 0 days    Minutes of Exercise per Session: 30 min  Stress: Stress Concern Present (06/16/2020)   [place]    Feeling of Stress : To some extent  Social Connections: Socially Isolated (06/16/2020)   Social Connection and Isolation Panel [NHANES]    Frequency of Communication with Friends and Family: Three times a week    Frequency of Social Gatherings with Friends and Family: Never    Attends Religious Services: Never    Marine scientist or Organizations: No    Attends Music therapist: Never    Marital Status: Divorced     Family History: The patient's family history includes Diabetes in his brother.  ROS:   Please see the history of present illness.    All other systems reviewed and are negative.  EKGs/Labs/Other Studies Reviewed:    The following studies were reviewed today: EKG reveals atrial sensed ventricular pacing.   Recent Labs: 12/13/2021: ALT 14; BUN 10; Creatinine, Ser 0.93; Potassium 4.0; Sodium 139 12/23/2021: Hemoglobin 11.0; Platelets 278  Recent Lipid Panel    Component Value Date/Time   CHOL 127 12/13/2021 0828   TRIG 346 (H) 12/13/2021 0828    HDL 29 (L) 12/13/2021 0828   CHOLHDL 4.4 12/13/2021 0828   CHOLHDL 5 12/20/2017 1320   VLDL 44.0 (H) 12/20/2017 1320   LDLCALC 46 12/13/2021 0828   LDLDIRECT 113.0 12/20/2017 1320    Physical Exam:  VS:  BP (!) 140/52   Pulse (!) 103   Ht 5\' 10"  (1.778 m)   Wt 148 lb 3.2 oz (67.2 kg)   SpO2 95%   BMI 21.26 kg/m     Wt Readings from Last 3 Encounters:  01/17/22 148 lb 3.2 oz (67.2 kg)  12/26/21 146 lb (66.2 kg)  12/13/21 147 lb (66.7 kg)     GEN: Patient is in no acute distress HEENT: Normal NECK: No JVD; No carotid bruits LYMPHATICS: No lymphadenopathy CARDIAC: Hear sounds regular, 2/6 systolic murmur at the apex. RESPIRATORY:  Clear to auscultation without rales, wheezing or rhonchi  ABDOMEN: Soft, non-tender, non-distended MUSCULOSKELETAL:  No edema; No deformity  SKIN: Warm and dry NEUROLOGIC:  Alert and oriented x 3 PSYCHIATRIC:  Normal affect   Signed, 02/12/22, MD  01/17/2022 2:43 PM    [facility]

## 2022-01-17 NOTE — Patient Instructions (Signed)
Medication Instructions:  Your physician has recommended you make the following change in your medication:   Start taking 2,000 mg fish oil twice daily.  *If you need a refill on your cardiac medications before your next appointment, please call your pharmacy*   Lab Work: None ordered If you have labs (blood work) drawn today and your tests are completely normal, you will receive your results only by: MyChart Message (if you have MyChart) OR A paper copy in the mail If you have any lab test that is abnormal or we need to change your treatment, we will call you to review the results.   Testing/Procedures: None ordered   Follow-Up: At [facility], you and your health needs are our priority.  As part of our continuing mission to provide you with exceptional heart care, we have created designated Provider Care Teams.  These Care Teams include your primary Cardiologist (physician) and Advanced Practice Providers (APPs -  Physician Assistants and Nurse Practitioners) who all work together to provide you with the care you need, when you need it.  We recommend signing up for the patient portal called "MyChart".  Sign up information is provided on this After Visit Summary.  MyChart is used to connect with patients for Virtual Visits (Telemedicine).  Patients are able to view lab/test results, encounter notes, upcoming appointments, etc.  Non-urgent messages can be sent to your provider as well.   To learn more about what you can do with MyChart, go to [email or web address].    Your next appointment:   9 month(s)  The format for your next appointment:   In Person  Provider:   Belva Crome, MD   Other Instructions NA

## 2022-01-18 ENCOUNTER — Ambulatory Visit (INDEPENDENT_AMBULATORY_CARE_PROVIDER_SITE_OTHER): Payer: Self-pay | Admitting: Podiatry

## 2022-01-18 DIAGNOSIS — Z91199 Patient's noncompliance with other medical treatment and regimen due to unspecified reason: Secondary | ICD-10-CM

## 2022-01-18 NOTE — Progress Notes (Signed)
No show

## 2022-01-19 ENCOUNTER — Telehealth: Payer: Self-pay

## 2022-01-19 NOTE — Chronic Care Management (AMB) (Signed)
Re-faxed patient assistance application to [employer] patient assistance program with corrections, household size was incorrect, fixed and refaxed.  Billee Cashing, CMA Clinical Pharmacist Assistant [phone]'

## 2022-01-25 ENCOUNTER — Telehealth: Payer: Self-pay

## 2022-01-25 NOTE — Patient Outreach (Signed)
  Care Coordination   01/25/2022 Name: Andrew Wiggins MRN: [id number] DOB: [birth year]   Care Coordination Outreach Attempts:  An unsuccessful telephone outreach was attempted today to offer the patient information about available care coordination services as a benefit of their health plan.   Follow Up Plan:  Additional outreach attempts will be made to offer the patient care coordination information and services.   Encounter Outcome:  No Answer  Care Coordination Interventions Activated:  No   Care Coordination Interventions:  No, not indicated    Tomasa Rand, RN, BSN, CEN [facility] [employer] [phone]

## 2022-01-26 ENCOUNTER — Telehealth: Payer: Self-pay

## 2022-01-26 NOTE — Progress Notes (Signed)
Chronic Care Management Pharmacy Assistant   Name: Andrew Wiggins  MRN: [id number] DOB: Apr 02, 1956   Reason for Encounter: Disease State call for DM    Recent office visits:  None  Recent consult visits:  01/17/22 (Cardiologist) Belva Crome MD. Seen for Peripheral Vascular Disease. Started on Omega 3.   Recent hospital visits None  Medications: Outpatient Encounter Medications as of 01/26/2022  Medication Sig   atorvastatin (LIPITOR) 20 MG tablet Take 1 tablet (20 mg total) by mouth daily.   clopidogrel (PLAVIX) 75 MG tablet Take 1 tablet (75 mg total) by mouth daily.   gabapentin (NEURONTIN) 300 MG capsule TAKE TWO CAPSULES BY MOUTH THREE TIMES DAILY   glimepiride (AMARYL) 4 MG tablet Take 1 tablet (4 mg total) by mouth daily.   hydrochlorothiazide (MICROZIDE) 12.5 MG capsule Take 12.5 mg by mouth daily.   HYDROcodone-acetaminophen (NORCO) 10-325 MG tablet Take 1 tablet by mouth 3 (three) times daily.   insulin degludec (TRESIBA FLEXTOUCH) 100 UNIT/ML FlexTouch Pen Inject 10 Units into the skin daily. (Patient not taking: Reported on 01/17/2022)   lisinopril (ZESTRIL) 20 MG tablet TAKE 1 TABLET BY MOUTH IN THE MORNING AND AT BEDTIME   metFORMIN (GLUCOPHAGE) 1000 MG tablet Take 1 tablet (1,000 mg total) by mouth 2 (two) times daily with a meal.   Omega-3 Fatty Acids (FISH OIL) 1000 MG CAPS Take 2 capsules (2,000 mg total) by mouth 2 (two) times daily.   Semaglutide (RYBELSUS) 7 MG TABS Take 7 mg by mouth daily. (Patient not taking: Reported on 01/17/2022)   No facility-administered encounter medications on file as of 01/26/2022.    Recent Relevant Labs: Lab Results  Component Value Date/Time   HGBA1C 8.6 (H) 12/23/2021 09:28 AM   HGBA1C 9.5 (H) 06/17/2021 08:37 AM   MICROALBUR 2.9 (H) 02/07/2017 02:03 PM    Kidney Function Lab Results  Component Value Date/Time   CREATININE 0.93 12/13/2021 08:28 AM   CREATININE 0.90 06/17/2021 08:37 AM   GFR 83.08 12/20/2017 01:20 PM    GFRNONAA 90 11/18/2019 10:23 AM   GFRAA 104 11/18/2019 10:23 AM     Current antihyperglycemic regimen:  Glimepiride 4mg  daily  Tresiba Flextouch 100units Inject 10 units daily (Not taking) Metformin 1000mg  two times daily   Patient verbally confirms he is taking the above medications as directed. Yes  What recent interventions/DTPs have been made to improve glycemic control:  Pt is trying to decrease his soda intake and the sugar he puts in his coffee.   Have there been any recent hospitalizations or ED visits since last visit with CPP? No  Patient denies hypoglycemic symptoms, including None  Patient denies hyperglycemic symptoms, including none  How often are you checking your blood sugar? in the morning before eating or drinking  What are your blood sugars ranging?  In the morning after a drink of pepsi 145 01/30/22 at 12:55pm. Ate 1 hour ago 345 I told him to start taking his readings in the am before meals the rest of this week and we will check back with him next week to get those readings. He understood  On insulin? Yes How many units:10 units. Pt is not taking any insulin right now. He stated he had a scare 2-3 mornings in a row due to low sugars. He does not want to take this right now.  During the week, how often does your blood glucose drop below 70? Never  Are you checking your feet daily/regularly? Yes  Adherence Review:  Is the patient currently on a STATIN medication? Yes Is the patient currently on ACE/ARB medication? Yes Does the patient have >5 day gap between last estimated fill dates? CPP to review  Care Gaps: Last eye exam / Retinopathy Screening? 10/28/21 Last Annual Wellness Visit? None noted Last Diabetic Foot Exam? 12/13/21   Star Rating Drugs:  Medication:  Last Fill: Day Supply Glimepiride   11/21/21-08/21/21 90ds Metformin   01/13/22 30ds-12/28/21 18ds Semaglutide  (Waiting on PAP)    Elray Mcgregor, [place] Pharmacist Assistant   [phone]

## 2022-01-30 DIAGNOSIS — M25552 Pain in left hip: Secondary | ICD-10-CM | POA: Diagnosis not present

## 2022-01-30 DIAGNOSIS — G894 Chronic pain syndrome: Secondary | ICD-10-CM | POA: Diagnosis not present

## 2022-01-30 DIAGNOSIS — M5136 Other intervertebral disc degeneration, lumbar region: Secondary | ICD-10-CM | POA: Diagnosis not present

## 2022-01-30 DIAGNOSIS — Z79891 Long term (current) use of opiate analgesic: Secondary | ICD-10-CM | POA: Diagnosis not present

## 2022-01-30 DIAGNOSIS — M5137 Other intervertebral disc degeneration, lumbosacral region: Secondary | ICD-10-CM | POA: Diagnosis not present

## 2022-01-30 DIAGNOSIS — M47818 Spondylosis without myelopathy or radiculopathy, sacral and sacrococcygeal region: Secondary | ICD-10-CM | POA: Diagnosis not present

## 2022-01-30 DIAGNOSIS — M48061 Spinal stenosis, lumbar region without neurogenic claudication: Secondary | ICD-10-CM | POA: Diagnosis not present

## 2022-01-30 DIAGNOSIS — M549 Dorsalgia, unspecified: Secondary | ICD-10-CM | POA: Diagnosis not present

## 2022-01-30 DIAGNOSIS — M169 Osteoarthritis of hip, unspecified: Secondary | ICD-10-CM | POA: Diagnosis not present

## 2022-01-30 DIAGNOSIS — M47816 Spondylosis without myelopathy or radiculopathy, lumbar region: Secondary | ICD-10-CM | POA: Diagnosis not present

## 2022-01-30 DIAGNOSIS — Z1389 Encounter for screening for other disorder: Secondary | ICD-10-CM | POA: Diagnosis not present

## 2022-01-30 NOTE — Progress Notes (Cosign Needed Addendum)
Pt called back and needed to report that he did get a steroid shot a week ago and his physician advised this is probably why his sugars have been running high. He advised to wait a week and they should be back to normal. He has not taking insulin for awhile and his physician advised to wait a week once his sugars go back normal and start the 5 units of insulin. I advised that he takes his insulin as directed if his sugars are high like this and I have messaged Ovid Curd to advise as well. Pt understood and will take 5 units of Tresiba today and check his sugars in the morning fasting. He will await on instruction as far as what to do with his Insulin going forward.    Andrew Wiggins, [place] Pharmacist Assistant  [phone]

## 2022-01-30 NOTE — Telephone Encounter (Signed)
Sent direct msg to PCP regarding Danielle's message

## 2022-01-31 ENCOUNTER — Ambulatory Visit (INDEPENDENT_AMBULATORY_CARE_PROVIDER_SITE_OTHER): Payer: PPO | Admitting: Podiatry

## 2022-01-31 DIAGNOSIS — I96 Gangrene, not elsewhere classified: Secondary | ICD-10-CM | POA: Diagnosis not present

## 2022-01-31 DIAGNOSIS — I739 Peripheral vascular disease, unspecified: Secondary | ICD-10-CM

## 2022-01-31 DIAGNOSIS — Z899 Acquired absence of limb, unspecified: Secondary | ICD-10-CM | POA: Diagnosis not present

## 2022-01-31 DIAGNOSIS — E1169 Type 2 diabetes mellitus with other specified complication: Secondary | ICD-10-CM | POA: Diagnosis not present

## 2022-01-31 NOTE — Progress Notes (Unsigned)
Subjective:  Patient ID: Andrew Wiggins, male    DOB: 1956-04-11,  MRN: [id number]  Chief Complaint  Patient presents with   Wound Check    6 wks wound check between 4th and 5th toe on left foot. Patient stated he noticed some drainage.     66 y.o. 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 ~  Jan11:  Last seen by Cards & pacer reprogrammed...     Cardiac pacemaker in situ    Carotid artery disease (HCC) 07/27/2010   R/O PERIPHERAL VASCULAR DISEASE (ICD-443.9) - on ASA 81mg /d... exam 11/09 shows gr 1-2 sys murmur LSB, but also has prominent bruit to base of right side of his neck w/ right > left Carotid Bruit... ~  CDopplers 11/09 showed severe plaque right ICA, & min plaque on left... no signif ICA stenoses per report. 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 smokes 1/2 - 1ppd x yrs... ~  PFT's 11/08 showed FVC= 3.85 (79%), FEV1= 2.19 (55%), %1sec= 57, mid-flows= 32% predicted:  all c/w mod airflow obstruction...  ~  CXR 12/10 showed left subclav pacer, mild hyperinflation, NAD...     Dermatitis, contact    due to poison ivy   Diabetes mellitus (HCC) 02/04/2020   Diabetes mellitus with neuropathy (HCC) 12/20/2016   Dyslipidemia 03/06/2012   Essential hypertension 04/10/2007    HYPERTENSION (ICD-401.9) - prev on Micardis 80mg /d then switched to Losartan 100mg /d, but he lost his job in 2011& couldn't afford Rx;  He called  14/07/2006 2/12 w/ this news & we phoned in Lisinopril 20mg /d... ~  2DEcho 11/09 showed sl incr LVwall thickness, norm LVF w/ EF= 55-60%, no regional wall motion abn, mild DD, mild MR... ~  Myoview 12/09 showed LBBB, no perfusion defects, abn septal motion & EF   GERD 03/26/2008    GERD (ICD-530.81) - prev mild GERD symptoms that improved after cholecystectomy on 2000... uses OTC PPI as needed... ~  he is due for routine screening colonoscopy...       Mixed hyperlipidemia 06/19/2019   PVD (peripheral vascular disease) (HCC)    Sciatica 11/18/2019   Tobacco use 02/04/2020   Tobacco use disorder     No Known Allergies  ROS: Negative except as per HPI above  Objective:  General: AAO x3, NAD  Dermatological: With inspection and palpation of the right and left lower extremities there are no open sores, no preulcerative lesions, no rash or signs of infection present. Nails are of normal length thickness and coloration.   Vascular:  Dorsalis Pedis artery and Posterior Tibial artery pedal pulses are 2/4 bilateral.  Capillary fill time brisk < 3 sec. Pedal hair growth present. No varicosities and no lower extremity edema present bilateral. There is no pain with calf compression, swelling, warmth, erythema.   Neruologic: Grossly intact via light touch bilateral. Protective threshold intact to all sites bilateral. Patellar and Achilles deep  tendon reflexes 2+ bilateral. Negative Babinski reflex.   Musculoskeletal: No gross boney pedal deformities bilateral. No pain, crepitus, or limitation noted with foot and ankle range of motion bilateral. Muscular strength 5/5 in all groups tested bilateral.  Gait: Unassisted, Nonantalgic.   No images are attached to the encounter.  Radiographs:  Date: *** XR {right/left foot:16097} Weightbearing AP/Lateral/Oblique   Findings: {MP Foot XR:23762::"no fracture, dislocation, swelling or degenerative changes noted"} Assessment:   1. Gangrene of toe of left foot ([place])    2. PAD (peripheral artery disease) ([place])   3. History of amputation   4. Type 2 diabetes mellitus with other specified complication, without long-term current use of insulin ([place])      Plan:  Patient was evaluated and treated and all questions answered.  #*** -***  Return in about 4 weeks (around 02/28/2022) for RFC and left 4th toe.          Everitt Amber, DPM [facility]

## 2022-02-02 ENCOUNTER — Telehealth: Payer: Self-pay

## 2022-02-02 NOTE — Patient Outreach (Signed)
  Care Coordination   02/02/2022 Name: Andrew Wiggins MRN: [id number] DOB: 1955-08-05   Care Coordination Outreach Attempts:  A second unsuccessful outreach was attempted today to offer the patient with information about available care coordination services as a benefit of their health plan.     Follow Up Plan:  Additional outreach attempts will be made to offer the patient care coordination information and services.   Encounter Outcome:  No Answer  Care Coordination Interventions Activated:  No   Care Coordination Interventions:  No, not indicated    Tomasa Rand, RN, BSN, CEN [facility] [employer] [phone]

## 2022-02-02 NOTE — Progress Notes (Signed)
    Chronic Care Management Pharmacy Assistant   Name: Andrew Wiggins  MRN: [id number] DOB: 01-Nov-1955   Reason for Encounter: Medication Coordination for [place]    Recent office visits:  None  Recent consult visits:  01/31/22 (Podiatry) Marzetta Merino [facility]. Seen for Gangrene of toe. No med changes.   Hospital visits:  None    Medications: Outpatient Encounter Medications as of 02/02/2022  Medication Sig   atorvastatin (LIPITOR) 20 MG tablet Take 1 tablet (20 mg total) by mouth daily.   clopidogrel (PLAVIX) 75 MG tablet Take 1 tablet (75 mg total) by mouth daily.   gabapentin (NEURONTIN) 300 MG capsule TAKE TWO CAPSULES BY MOUTH THREE TIMES DAILY   glimepiride (AMARYL) 4 MG tablet Take 1 tablet (4 mg total) by mouth daily.   hydrochlorothiazide (MICROZIDE) 12.5 MG capsule Take 12.5 mg by mouth daily.   HYDROcodone-acetaminophen (NORCO) 10-325 MG tablet Take 1 tablet by mouth 3 (three) times daily.   insulin degludec (TRESIBA FLEXTOUCH) 100 UNIT/ML FlexTouch Pen Inject 10 Units into the skin daily. (Patient not taking: Reported on 01/17/2022)   lisinopril (ZESTRIL) 20 MG tablet TAKE 1 TABLET BY MOUTH IN THE MORNING AND AT BEDTIME   metFORMIN (GLUCOPHAGE) 1000 MG tablet Take 1 tablet (1,000 mg total) by mouth 2 (two) times daily with a meal.   Omega-3 Fatty Acids (FISH OIL) 1000 MG CAPS Take 2 capsules (2,000 mg total) by mouth 2 (two) times daily.   Semaglutide (RYBELSUS) 7 MG TABS Take 7 mg by mouth daily. (Patient not taking: Reported on 01/17/2022)   No facility-administered encounter medications on file as of 02/02/2022.    Reviewed chart for medication changes ahead of medication coordination call.  No OVs, or hospital visits since last care coordination call/Pharmacist visit.    No medication changes indicated OR if recent visit, treatment plan here.  BP Readings from Last 3 Encounters:  01/17/22 (!) 140/52  12/26/21 (!) 181/74  12/13/21 132/74    Lab Results   Component Value Date   HGBA1C 8.6 (H) 12/23/2021     Patient obtains medications through Adherence Packaging  30 Days   Last adherence delivery included:  Aspirin 81mg  1 at EM Atorvastatin 20mg  1 at EM HCTZ 12.5mg  1 at B Lisinopril 20mg  1 at B 1 at EM Metformin 1000mg  1 at B 1 at EM Clopidogrel 75mg  1 at B Gabapentin 300mg  2 at B, 2 at L, 2 at EM  Patient declined (meds) last month  Unable to reach   Patient is due for next adherence delivery on: 02/13/22. Called patient and reviewed medications and coordinated delivery.  This delivery to include: Aspirin 81mg  1 at EM Atorvastatin 20mg  1 at EM HCTZ 12.5mg  1 at B Lisinopril 20mg  1 at B 1 at EM Metformin 1000mg  1 at B 1 at EM Clopidogrel 75mg  1 at B Gabapentin 300mg  2 at B, 2 at L, 2 at EM  Patient declined the following medications  Tresiba Flextouch-Has not been using Fish Oil 1000mg -[facility] Semaglutide- PAP Glimepiride 4mg -11/21/21 90ds  Blood sugar Readings: Fasting with no Insulin:  01/31/22-108 02/01/22-112 02/02/22-110  Patient needs refills-Request Sent  Atorvastatin 20mg  HCTZ 12.5mg  Clopidogrel 75mg   Confirmed delivery date of 02/13/22, advised patient that pharmacy will contact them the morning of delivery.   Elray Mcgregor, [place] Pharmacist Assistant  [phone]

## 2022-02-06 ENCOUNTER — Other Ambulatory Visit: Payer: Self-pay

## 2022-02-06 DIAGNOSIS — E782 Mixed hyperlipidemia: Secondary | ICD-10-CM

## 2022-02-06 DIAGNOSIS — I739 Peripheral vascular disease, unspecified: Secondary | ICD-10-CM

## 2022-02-06 MED ORDER — CLOPIDOGREL BISULFATE 75 MG PO TABS: 75.0000 mg | ORAL_TABLET | Freq: Every day | ORAL | 2 refills | Status: DC

## 2022-02-06 MED ORDER — HYDROCHLOROTHIAZIDE 12.5 MG PO CAPS: 12.5000 mg | ORAL_CAPSULE | Freq: Every day | ORAL | 2 refills | Status: DC

## 2022-02-06 MED ORDER — ATORVASTATIN CALCIUM 20 MG PO TABS: 20.0000 mg | ORAL_TABLET | Freq: Every day | ORAL | 2 refills | Status: DC

## 2022-02-08 ENCOUNTER — Other Ambulatory Visit: Payer: Self-pay | Admitting: Legal Medicine

## 2022-02-08 ENCOUNTER — Telehealth: Payer: Self-pay

## 2022-02-08 DIAGNOSIS — I739 Peripheral vascular disease, unspecified: Secondary | ICD-10-CM

## 2022-02-08 DIAGNOSIS — E782 Mixed hyperlipidemia: Secondary | ICD-10-CM

## 2022-02-08 DIAGNOSIS — I10 Essential (primary) hypertension: Secondary | ICD-10-CM

## 2022-02-08 NOTE — Patient Outreach (Signed)
  Care Coordination   Initial Visit Note   02/08/2022 Name: Andrew Wiggins MRN: [id number] DOB: January 30, 1956  Andrew Wiggins is a 66 y.o. year old male who sees [name], MD for primary care. I spoke with  Dicky Doe Totten by phone today.  What matters to the patients health and wellness today?  Placed call to patient today and reviewed [facility] care coordination program. Patient has verbally agreed. He is interested in working on his DM      SDOH assessments and interventions completed:  No     Care Coordination Interventions Activated:  Yes  Care Coordination Interventions:  Yes, provided   Follow up plan: Follow up call scheduled for 02/09/2022    Encounter Outcome:  Pt. Visit Completed  Tomasa Rand, RN, BSN, CEN [place] Coordinator [phone]

## 2022-02-09 ENCOUNTER — Ambulatory Visit: Payer: Self-pay

## 2022-02-09 NOTE — Patient Outreach (Signed)
  Care Coordination   02/09/2022 Name: KAIROS PANETTA MRN: [id number] DOB: [birth year]   Care Coordination Outreach Attempts:  An unsuccessful telephone outreach was attempted for a scheduled appointment today.  Follow Up Plan:  Additional outreach attempts will be made to offer the patient care coordination information and services.   Encounter Outcome:  No Answer  Care Coordination Interventions Activated:  No   Care Coordination Interventions:  No, not indicated    Tomasa Rand, RN, BSN, CEN [facility] [employer] [phone]

## 2022-02-13 ENCOUNTER — Telehealth: Payer: Self-pay | Admitting: *Deleted

## 2022-02-13 NOTE — Chronic Care Management (AMB) (Signed)
  Care Coordination  Outreach Note  02/13/2022 Name: Andrew Wiggins MRN: [id number] DOB: 28-Mar-1956   Care Coordination Outreach Attempts: An unsuccessful telephone outreach was attempted today to offer the patient information about available care coordination services as a benefit of their health plan.   Rescheduling   Follow Up Plan:  Additional outreach attempts will be made to offer the patient care coordination information and services.   Encounter Outcome:  No Answer  Julian Hy, [place] Direct Dial: [phone]

## 2022-02-15 ENCOUNTER — Telehealth: Payer: Self-pay

## 2022-02-15 NOTE — Progress Notes (Signed)
Care Gap(s) Not Met that Need to be Addressed:   Colorectal Cancer Screening- Pt declined this for this year but will address next year  Controlling High Blood Pressure- Update BP reading with 01/17/22 140/52, 12/13/21 132/74 and have tried to call pt to get a home BP reading   Action Taken:    Follow Up:

## 2022-02-15 NOTE — Progress Notes (Signed)
I spoke to Andrew Wiggins today and he reported his DM sugars  Currently Taking:  Metformin 1000mg  twice a day Glimepiride 4mg  daily Not taking Tresiba  02/02/22 86 02/03/22 123 02/04/22 65 02/05/22 86 02/06/22 84 02/07/22 76 02/08/22 75 02/09/22 72 02/10/22 118 02/11/22 81 02/12/22 107 02/13/22 102 02/14/22 88 02/15/22 [place], [place] Pharmacist Assistant  [phone]

## 2022-02-24 ENCOUNTER — Other Ambulatory Visit (HOSPITAL_COMMUNITY): Payer: Self-pay

## 2022-02-24 ENCOUNTER — Ambulatory Visit (INDEPENDENT_AMBULATORY_CARE_PROVIDER_SITE_OTHER): Payer: PPO

## 2022-02-24 DIAGNOSIS — I442 Atrioventricular block, complete: Secondary | ICD-10-CM

## 2022-02-24 LAB — CUP PACEART REMOTE DEVICE CHECK
Battery Remaining Longevity: 150 mo
Battery Voltage: 3.1 V
Brady Statistic AP VP Percent: 2.56 %
Brady Statistic AP VS Percent: 0 %
Brady Statistic AS VP Percent: 97.43 %
Brady Statistic AS VS Percent: 0.01 %
Brady Statistic RA Percent Paced: 2.6 %
Brady Statistic RV Percent Paced: 99.99 %
Date Time Interrogation Session: 20231020000941
Implantable Lead Implant Date: 20091120
Implantable Lead Implant Date: 20091120
Implantable Lead Location: 753859
Implantable Lead Location: 753860
Implantable Lead Model: 5076
Implantable Lead Model: 5076
Implantable Pulse Generator Implant Date: 20230118
Lead Channel Impedance Value: 304 Ohm
Lead Channel Impedance Value: 399 Ohm
Lead Channel Impedance Value: 722 Ohm
Lead Channel Impedance Value: 779 Ohm
Lead Channel Pacing Threshold Amplitude: 0.75 V
Lead Channel Pacing Threshold Amplitude: 0.875 V
Lead Channel Pacing Threshold Pulse Width: 0.4 ms
Lead Channel Pacing Threshold Pulse Width: 0.4 ms
Lead Channel Sensing Intrinsic Amplitude: 2.375 mV
Lead Channel Sensing Intrinsic Amplitude: 2.375 mV
Lead Channel Sensing Intrinsic Amplitude: 6.75 mV
Lead Channel Setting Pacing Amplitude: 1.5 V
Lead Channel Setting Pacing Amplitude: 2 V
Lead Channel Setting Pacing Pulse Width: 0.4 ms
Lead Channel Setting Sensing Sensitivity: 0.9 mV

## 2022-02-24 MED ORDER — HYDROCODONE-ACETAMINOPHEN 10-325 MG PO TABS
1.0000 | ORAL_TABLET | Freq: Three times a day (TID) | ORAL | 0 refills | Status: DC | PRN
  Filled 2022-02-24 – 2022-02-27 (×2): qty 90, 30d supply, fill #0

## 2022-02-27 ENCOUNTER — Other Ambulatory Visit (HOSPITAL_COMMUNITY): Payer: Self-pay

## 2022-02-28 ENCOUNTER — Other Ambulatory Visit: Payer: Self-pay | Admitting: Legal Medicine

## 2022-02-28 ENCOUNTER — Ambulatory Visit: Payer: PPO | Admitting: Podiatry

## 2022-02-28 DIAGNOSIS — M79675 Pain in left toe(s): Secondary | ICD-10-CM

## 2022-02-28 DIAGNOSIS — M79674 Pain in right toe(s): Secondary | ICD-10-CM | POA: Diagnosis not present

## 2022-02-28 DIAGNOSIS — B351 Tinea unguium: Secondary | ICD-10-CM

## 2022-02-28 DIAGNOSIS — I96 Gangrene, not elsewhere classified: Secondary | ICD-10-CM

## 2022-02-28 DIAGNOSIS — E1169 Type 2 diabetes mellitus with other specified complication: Secondary | ICD-10-CM

## 2022-02-28 DIAGNOSIS — I739 Peripheral vascular disease, unspecified: Secondary | ICD-10-CM

## 2022-02-28 NOTE — Progress Notes (Signed)
  Subjective:  Patient ID: Andrew Wiggins, male    DOB: 03/02/1956,  MRN: [id number]  Chief Complaint  Patient presents with   Nail Problem    Nail trim.     Follow-up    4 weeks for left 4th toe gangrene. Sensitive to touch. No sign of infection at this time.    66 y.o. male presents with the above complaint. History confirmed with patient. Patient presenting with pain related to dystrophic thickened elongated nails. Patient is unable to trim own nails related to nail dystrophy and/or mobility issues. 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 Denies any redness swelling or drainage of the left 4th toe stump. Denies N/V/F/C.  Objective:  Physical Exam: warm, good capillary refill nail exam onychomycosis of the toenails, onycholysis, and dystrophic nails DP pulses palpable, PT pulses palpable, and protective sensation absent Left Foot:  Pain with palpation of nails due to elongation and dystrophic growth.  Right Foot: Pain with palpation of nails due to elongation and dystrophic growth.   Assessment:   1. Pain due to onychomycosis of toenails of both feet   2. Gangrene of toe of left foot ([place])   3. PAD (peripheral artery disease) ([place])   4. Type 2 diabetes mellitus with other specified complication, without long-term current use of insulin ([place])      Plan:  Patient was evaluated and treated and all questions answered.  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 -Continue to use some kind of a spacer device in between the fourth and fifth toe to prevent irritation between the 2. -Continue to monitor for any signs of infection or opening of the  wound.  #Onychomycosis with pain  -Nails palliatively debrided as below. -Educated on self-care  Procedure: Nail Debridement Rationale: Pain Type of Debridement: manual, sharp debridement. Instrumentation: Nail nipper, rotary burr. Number of Nails: 10  Return in about 3 months (around 05/31/2022) for [facility].         Everitt Amber, DPM [facility]

## 2022-03-02 ENCOUNTER — Other Ambulatory Visit (HOSPITAL_COMMUNITY): Payer: Self-pay

## 2022-03-02 DIAGNOSIS — M47818 Spondylosis without myelopathy or radiculopathy, sacral and sacrococcygeal region: Secondary | ICD-10-CM | POA: Diagnosis not present

## 2022-03-02 DIAGNOSIS — M549 Dorsalgia, unspecified: Secondary | ICD-10-CM | POA: Diagnosis not present

## 2022-03-02 DIAGNOSIS — M5136 Other intervertebral disc degeneration, lumbar region: Secondary | ICD-10-CM | POA: Diagnosis not present

## 2022-03-02 DIAGNOSIS — Z79891 Long term (current) use of opiate analgesic: Secondary | ICD-10-CM | POA: Diagnosis not present

## 2022-03-02 DIAGNOSIS — M25552 Pain in left hip: Secondary | ICD-10-CM | POA: Diagnosis not present

## 2022-03-02 DIAGNOSIS — M48061 Spinal stenosis, lumbar region without neurogenic claudication: Secondary | ICD-10-CM | POA: Diagnosis not present

## 2022-03-02 DIAGNOSIS — Z1389 Encounter for screening for other disorder: Secondary | ICD-10-CM | POA: Diagnosis not present

## 2022-03-02 DIAGNOSIS — M169 Osteoarthritis of hip, unspecified: Secondary | ICD-10-CM | POA: Diagnosis not present

## 2022-03-02 DIAGNOSIS — M47816 Spondylosis without myelopathy or radiculopathy, lumbar region: Secondary | ICD-10-CM | POA: Diagnosis not present

## 2022-03-02 DIAGNOSIS — M5137 Other intervertebral disc degeneration, lumbosacral region: Secondary | ICD-10-CM | POA: Diagnosis not present

## 2022-03-02 DIAGNOSIS — G894 Chronic pain syndrome: Secondary | ICD-10-CM | POA: Diagnosis not present

## 2022-03-02 MED ORDER — HYDROCODONE-ACETAMINOPHEN 10-325 MG PO TABS
1.0000 | ORAL_TABLET | Freq: Three times a day (TID) | ORAL | 0 refills | Status: DC | PRN
  Filled 2022-03-28: qty 90, 30d supply, fill #0

## 2022-03-02 NOTE — Progress Notes (Signed)
Remote pacemaker transmission.   

## 2022-03-03 ENCOUNTER — Telehealth: Payer: Self-pay

## 2022-03-03 NOTE — Progress Notes (Unsigned)
    Chronic Care Management Pharmacy Assistant   Name: Andrew Wiggins  MRN: [id number] DOB: [birth year]   Reason for Encounter: Medication Coordination/DM Assessment     Recent office visits:  None  Recent consult visits:  02/28/22 (Podiatry) Marzetta Merino [facility]. Seen for nail problem. No med changes.  Hospital visits:  None  Medications: Outpatient Encounter Medications as of 03/03/2022  Medication Sig   atorvastatin (LIPITOR) 20 MG tablet TAKE ONE TABLET BY MOUTH EVERY EVENING   clopidogrel (PLAVIX) 75 MG tablet TAKE ONE TABLET BY MOUTH EVERY MORNING   gabapentin (NEURONTIN) 300 MG capsule TAKE TWO CAPSULES BY MOUTH THREE TIMES DAILY   glimepiride (AMARYL) 4 MG tablet Take 1 tablet (4 mg total) by mouth daily.   hydrochlorothiazide (MICROZIDE) 12.5 MG capsule TAKE ONE CAPSULE BY MOUTH EVERY MORNING   HYDROcodone-acetaminophen (NORCO) 10-325 MG tablet Take 1 tablet by mouth 3 (three) times daily.   HYDROcodone-acetaminophen (NORCO) 10-325 MG tablet Take 1 tablet by mouth 3 (three) times daily as needed for back pain   [START ON 03/28/2022] HYDROcodone-acetaminophen (NORCO) 10-325 MG tablet Take 1 tablet by mouth 3 (three) times daily as needed for back pain. Fill on 03/28/22   insulin degludec (TRESIBA FLEXTOUCH) 100 UNIT/ML FlexTouch Pen Inject 10 Units into the skin daily. (Patient not taking: Reported on 01/17/2022)   lisinopril (ZESTRIL) 20 MG tablet TAKE ONE TABLET BY MOUTH EVERY MORNING and TAKE ONE TABLET BY MOUTH EVERY EVENING   metFORMIN (GLUCOPHAGE) 1000 MG tablet Take 1 tablet (1,000 mg total) by mouth 2 (two) times daily with a meal.   Omega-3 Fatty Acids (FISH OIL) 1000 MG CAPS Take 2 capsules (2,000 mg total) by mouth 2 (two) times daily.   Semaglutide (RYBELSUS) 7 MG TABS Take 7 mg by mouth daily. (Patient not taking: Reported on 01/17/2022)   No facility-administered encounter medications on file as of 03/03/2022.    Reviewed chart for medication changes ahead  of medication coordination call.  No OVs, hospital visits since last care coordination call/Pharmacist visit.   No medication changes indicated OR if recent visit, treatment plan here.  BP Readings from Last 3 Encounters:  01/17/22 (!) 140/52  12/26/21 (!) 181/74  12/13/21 132/74    Lab Results  Component Value Date   HGBA1C 8.6 (H) 12/23/2021     Patient obtains medications through Adherence Packaging  30 Days   Last adherence delivery included:  Aspirin 81mg  1 at EM Atorvastatin 20mg  1 at EM HCTZ 12.5mg  1 at B Lisinopril 20mg  1 at B 1 at EM Metformin 1000mg  1 at B 1 at EM Clopidogrel 75mg  1 at B Gabapentin 300mg  2 at B, 2 at L, 2 at EM  Patient declined (meds) last month  Tresiba Flextouch-Has not been using Fish Oil 1000mg -[facility] Semaglutide- PAP Glimepiride 4mg -11/21/21 90ds  Patient is due for next adherence delivery on: 03/15/22. Called patient and reviewed medications and coordinated delivery.  This delivery to include: Aspirin 81mg  1 at EM Atorvastatin 20mg  1 at EM HCTZ 12.5mg  1 at B Lisinopril 20mg  1 at B 1 at EM Metformin 1000mg  1 at B 1 at EM Clopidogrel 75mg  1 at B Gabapentin 300mg  2 at B, 2 at L, 2 at EM  Patient declined the following medications  Unable to reach pt to complete this call   Patient needs refills  None  Delivery not confirmed, unable to reach pt    Elray Mcgregor, [place] Pharmacist Assistant  [phone]

## 2022-03-03 NOTE — Chronic Care Management (AMB) (Signed)
  Care Coordination Note  03/03/2022 Name: Andrew Wiggins MRN: [id number] DOB: [birth year]  Andrew Wiggins is a 66 y.o. year old male who is a primary care patient of [name], MD and is actively engaged with the care management team. I reached out to Cloyd Stagers by phone today to assist with re-scheduling a follow up visit with the RN Case Manager  Follow up plan: 2nd Unsuccessful telephone outreach attempt made. A HIPAA compliant phone message was left for the patient providing contact information and requesting a return call.   Julian Hy, [place] Direct Dial: [phone]

## 2022-03-06 ENCOUNTER — Other Ambulatory Visit: Payer: Self-pay | Admitting: Legal Medicine

## 2022-03-06 DIAGNOSIS — E0840 Diabetes mellitus due to underlying condition with diabetic neuropathy, unspecified: Secondary | ICD-10-CM

## 2022-03-09 ENCOUNTER — Telehealth: Payer: Self-pay

## 2022-03-09 NOTE — Telephone Encounter (Signed)
   Patient Name: Andrew Wiggins  DOB: 1955-11-24 MRN: [id number]  Primary Cardiologist: None  Chart reviewed as part of pre-operative protocol coverage.   Patient's Plavix is managed by vascular surgery, not cardiology.  Therefore, recommendations on holding Plavix prior to procedure should come from vascular surgery.  I will route this recommendation to the requesting provider via epic fax function and remove the request from  the preop pool.    Please call with any questions.  Thank you.  Lenna Sciara, NP 03/09/2022, 9:57 AM

## 2022-03-09 NOTE — Telephone Encounter (Signed)
   Pre-operative Risk Assessment    Patient Name: Andrew Wiggins  DOB: Apr 18, 1956 MRN: [id number]      Request for Surgical Clearance    Procedure:   Steroid injections  Date of Surgery:  Clearance TBD                                 Surgeon:   Surgeon's Group or Practice Name:  [facility] Phone number:  [phone] Fax number:  [phone]   Type of Clearance Requested:   - Pharmacy:  Hold Clopidogrel (Plavix)     Type of Anesthesia:  Not Indicated   Additional requests/questions:    [name]   03/09/2022, 8:01 AM

## 2022-03-10 NOTE — Progress Notes (Signed)
  Care Coordination Note  03/10/2022 Name: Andrew Wiggins MRN: [id number] DOB: September 01, 1955  Andrew Wiggins is a 66 y.o. year old male who is a primary care patient of [name], MD and is actively engaged with the care management team. I reached out to Cloyd Stagers by phone today to assist with re-scheduling a follow up visit with the RN Case Manager  Follow up plan: We have been unable to make contact with the patient for follow up.    Julian Hy, [place] Direct Dial: [phone]

## 2022-03-20 ENCOUNTER — Ambulatory Visit (INDEPENDENT_AMBULATORY_CARE_PROVIDER_SITE_OTHER)
Admission: RE | Admit: 2022-03-20 | Discharge: 2022-03-20 | Disposition: A | Discharge status: Home / Self Care | Payer: PPO | Source: Ambulatory Visit | Attending: Surgery | Admitting: Surgery

## 2022-03-20 ENCOUNTER — Ambulatory Visit: Payer: PPO | Admitting: Surgery

## 2022-03-20 ENCOUNTER — Encounter: Payer: Self-pay | Admitting: Surgery

## 2022-03-20 ENCOUNTER — Ambulatory Visit (HOSPITAL_COMMUNITY)
Admission: RE | Admit: 2022-03-20 | Discharge: 2022-03-20 | Disposition: A | Discharge status: Home / Self Care | Payer: PPO | Source: Ambulatory Visit | Attending: Surgery | Admitting: Surgery

## 2022-03-20 VITALS — BP 167/74 | HR 105 | Temp 98.3°F | Resp 20 | Ht 70.0 in | Wt 147.6 lb

## 2022-03-20 DIAGNOSIS — I70222 Atherosclerosis of native arteries of extremities with rest pain, left leg: Secondary | ICD-10-CM | POA: Diagnosis not present

## 2022-03-20 DIAGNOSIS — I739 Peripheral vascular disease, unspecified: Secondary | ICD-10-CM | POA: Diagnosis not present

## 2022-03-20 NOTE — Progress Notes (Signed)
Vascular and Vein Specialist of [facility]  Patient name: Andrew Wiggins MRN: [id number] DOB: 09-24-1955 Sex: male   REASON FOR VISIT:    Follow up  HISOTRY OF PRESENT ILLNESS:    Andrew Wiggins is a 66 y.o. 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  He does not have any open wounds.  He denies claudication.    Patient is a current smoker.  He has COPD.  He takes a statin for hypercholesterolemia.  He is medically managed for hypertension.  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 ~  Jan11:  Last seen by Cards & pacer reprogrammed...     Cardiac pacemaker in situ    Carotid artery disease (HCC) 07/27/2010   R/O PERIPHERAL VASCULAR DISEASE (ICD-443.9) - on ASA 81mg /d... exam 11/09 shows gr 1-2 sys murmur LSB, but also has prominent bruit to base of right side of his neck w/ right > left Carotid Bruit... ~  CDopplers 11/09 showed severe plaque right ICA, & min plaque on left... no signif ICA stenoses per report. 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 smokes 1/2 - 1ppd x yrs... ~  PFT's 11/08 showed FVC= 3.85 (79%),  FEV1= 2.19 (55%), %1sec= 57, mid-flows= 32% predicted:  all c/w mod airflow obstruction...  ~  CXR 12/10 showed left subclav pacer, mild hyperinflation, NAD...     Dermatitis, contact    due to poison ivy   Diabetes mellitus (HCC) 02/04/2020   Diabetes mellitus with neuropathy (HCC) 12/20/2016   Dyslipidemia 03/06/2012   Essential hypertension 04/10/2007    HYPERTENSION (ICD-401.9) - prev on Micardis 80mg /d then switched to Losartan 100mg /d, but he lost his job in 2011& couldn't afford Rx;  He called 14/07/2006 2/12 w/ this news & we phoned in Lisinopril 20mg /d... ~  2DEcho 11/09 showed sl incr LVwall thickness, norm LVF w/ EF= 55-60%, no regional wall motion abn, mild DD, mild MR... ~  Myoview 12/09 showed LBBB, no perfusion defects, abn septal motion & EF   GERD 03/26/2008    GERD (ICD-530.81) - prev mild GERD symptoms that improved after cholecystectomy on 2000... uses OTC PPI as needed... ~  he is due for routine screening colonoscopy...       Mixed hyperlipidemia 06/19/2019   PVD (peripheral vascular disease) (HCC)    Sciatica 11/18/2019   Tobacco use 02/04/2020   Tobacco use disorder      FAMILY HISTORY:   Family History  Problem Relation Age of Onset   Diabetes Brother     SOCIAL HISTORY:   Social History   Tobacco Use   Smoking status:  Every Day    Packs/day: 0.50    Years: 25.00    Total pack years: 12.50    Types: Cigarettes    Passive exposure: Never   Smokeless tobacco: Never  Substance Use Topics   Alcohol use: Not Currently    Alcohol/week: 0.0 standard drinks of alcohol     ALLERGIES:   No Known Allergies   CURRENT MEDICATIONS:   Current Outpatient Medications  Medication Sig Dispense Refill   atorvastatin (LIPITOR) 20 MG tablet TAKE ONE TABLET BY MOUTH EVERY EVENING 90 tablet 2   clopidogrel (PLAVIX) 75 MG tablet TAKE ONE TABLET BY MOUTH EVERY MORNING 90 tablet 2   gabapentin (NEURONTIN) 300 MG capsule TAKE TWO CAPSULES BY MOUTH THREE TIMES DAILY 270  capsule 1   glimepiride (AMARYL) 4 MG tablet Take 1 tablet by mouth once daily 90 tablet 0   hydrochlorothiazide (MICROZIDE) 12.5 MG capsule TAKE ONE CAPSULE BY MOUTH EVERY MORNING 90 capsule 2   HYDROcodone-acetaminophen (NORCO) 10-325 MG tablet Take 1 tablet by mouth 3 (three) times daily.     HYDROcodone-acetaminophen (NORCO) 10-325 MG tablet Take 1 tablet by mouth 3 (three) times daily as needed for back pain 90 tablet 0   [START ON 03/28/2022] HYDROcodone-acetaminophen (NORCO) 10-325 MG tablet Take 1 tablet by mouth 3 (three) times daily as needed for back pain. Fill on 03/28/22 90 tablet 0   lisinopril (ZESTRIL) 20 MG tablet TAKE ONE TABLET BY MOUTH EVERY MORNING and TAKE ONE TABLET BY MOUTH EVERY EVENING 180 tablet 1   metFORMIN (GLUCOPHAGE) 1000 MG tablet Take 1 tablet (1,000 mg total) by mouth 2 (two) times daily with a meal. 180 tablet 1   Omega-3 Fatty Acids (FISH OIL) 1000 MG CAPS Take 2 capsules (2,000 mg total) by mouth 2 (two) times daily. 180 capsule 12   insulin degludec (TRESIBA FLEXTOUCH) 100 UNIT/ML FlexTouch Pen Inject 10 Units into the skin daily. (Patient not taking: Reported on 01/17/2022) 6 mL 3   Semaglutide (RYBELSUS) 7 MG TABS Take 7 mg by mouth daily. (Patient not taking: Reported on 01/17/2022) 90 tablet 1   No current facility-administered medications for this visit.    REVIEW OF SYSTEMS:   [X]  denotes positive finding, [ ]  denotes negative finding Cardiac  Comments:  Chest pain or chest pressure:    Shortness of breath upon exertion:    Short of breath when lying flat:    Irregular heart rhythm:        Vascular    Pain in calf, thigh, or hip brought on by ambulation:    Pain in feet at night that wakes you up from your sleep:     Blood clot in your veins:    Leg swelling:         Pulmonary    Oxygen at home:    Productive cough:     Wheezing:         Neurologic    Sudden weakness in arms or legs:     Sudden numbness in arms or legs:     Sudden onset  of difficulty speaking or slurred speech:    Temporary loss of vision in one eye:     Problems with dizziness:         Gastrointestinal    Blood in stool:     Vomited blood:         Genitourinary    Burning when urinating:     Blood in urine:  Psychiatric    Major depression:         Hematologic    Bleeding problems:    Problems with blood clotting too easily:        Skin    Rashes or ulcers:        Constitutional    Fever or chills:      PHYSICAL EXAM:   Vitals:   03/20/22 1348  BP: (!) 167/74  Pulse: (!) 105  Resp: 20  Temp: 98.3 F (36.8 C)  SpO2: 98%  Weight: 147 lb 9.6 oz (67 kg)  Height: 5\' 10"  (1.778 m)    GENERAL: The patient is a well-nourished male, in no acute distress. The vital signs are documented above. CARDIAC: There is a regular rate and rhythm.  PULMONARY: Non-labored respirations MUSCULOSKELETAL: There are no major deformities or cyanosis. NEUROLOGIC: No focal weakness or paresthesias are detected. SKIN: There are no ulcers or rashes noted. PSYCHIATRIC: The patient has a normal affect.  STUDIES:   I have reviewed the following:  +-------+-----------+-----------+------------+------------+  ABI/TBIToday's ABIToday's TBIPrevious ABIPrevious TBI  +-------+-----------+-----------+------------+------------+  Right 0.76       0.47       0.79        0.37          +-------+-----------+-----------+------------+------------+  Left  0.58       0.53       0.62        0.27          +-------+-----------+-----------+------------+------------+  Right toe pressure:  77 Left toe pressure:  87   Left: 30-49% stenosis noted in the common femoral artery.  50-74% stenosis noted in the deep femoral artery.  50-74% stenosis noted in the proximal superficial femoral artery.  Widely patent distal superficial femoral artery stent.  MEDICAL ISSUES:   PAD with ulcer: The patient no longer has any open wounds on his left leg.  His  fourth toe auto amputated.  He does not have claudication.  He has had a slight increase in the velocities within his stent on the left without any significant change in his ABIs and he remains asymptomatic.  I will continue to monitor this.  He will return in 6 months for follow-up study.  He knows to contact me should he develop worsening symptoms before then.  I did tell him that it would be okay to stop Plavix for injections of his back.    , MD, FACS [facility] Specialists of [facility] [phone] Pager [phone]

## 2022-03-24 ENCOUNTER — Other Ambulatory Visit: Payer: Self-pay

## 2022-03-24 DIAGNOSIS — I739 Peripheral vascular disease, unspecified: Secondary | ICD-10-CM

## 2022-03-24 DIAGNOSIS — I70222 Atherosclerosis of native arteries of extremities with rest pain, left leg: Secondary | ICD-10-CM

## 2022-03-28 ENCOUNTER — Other Ambulatory Visit (HOSPITAL_COMMUNITY): Payer: Self-pay

## 2022-04-03 ENCOUNTER — Telehealth: Payer: Self-pay

## 2022-04-03 NOTE — Progress Notes (Signed)
Chronic Care Management Pharmacy Assistant   Name: Andrew Wiggins  MRN: [id number] DOB: Jul 24, 1955   Reason for Encounter: Medication Coordination for [facility]   Recent office visits:  None  Recent consult visits:  03/20/22 (Vascular Surgery) Coral Else MD. Seen for Limb Ischemia. No med changes.  Hospital visits:  None  Medications: Outpatient Encounter Medications as of 04/03/2022  Medication Sig   atorvastatin (LIPITOR) 20 MG tablet TAKE ONE TABLET BY MOUTH EVERY EVENING   clopidogrel (PLAVIX) 75 MG tablet TAKE ONE TABLET BY MOUTH EVERY MORNING   gabapentin (NEURONTIN) 300 MG capsule TAKE TWO CAPSULES BY MOUTH THREE TIMES DAILY   glimepiride (AMARYL) 4 MG tablet Take 1 tablet by mouth once daily   hydrochlorothiazide (MICROZIDE) 12.5 MG capsule TAKE ONE CAPSULE BY MOUTH EVERY MORNING   HYDROcodone-acetaminophen (NORCO) 10-325 MG tablet Take 1 tablet by mouth 3 (three) times daily.   HYDROcodone-acetaminophen (NORCO) 10-325 MG tablet Take 1 tablet by mouth 3 (three) times daily as needed for back pain   HYDROcodone-acetaminophen (NORCO) 10-325 MG tablet Take 1 tablet by mouth 3 (three) times daily as needed for back pain. Fill on 03/28/22   insulin degludec (TRESIBA FLEXTOUCH) 100 UNIT/ML FlexTouch Pen Inject 10 Units into the skin daily. (Patient not taking: Reported on 01/17/2022)   lisinopril (ZESTRIL) 20 MG tablet TAKE ONE TABLET BY MOUTH EVERY MORNING and TAKE ONE TABLET BY MOUTH EVERY EVENING   metFORMIN (GLUCOPHAGE) 1000 MG tablet Take 1 tablet (1,000 mg total) by mouth 2 (two) times daily with a meal.   Omega-3 Fatty Acids (FISH OIL) 1000 MG CAPS Take 2 capsules (2,000 mg total) by mouth 2 (two) times daily.   Semaglutide (RYBELSUS) 7 MG TABS Take 7 mg by mouth daily. (Patient not taking: Reported on 01/17/2022)   No facility-administered encounter medications on file as of 04/03/2022.    Reviewed chart for medication changes ahead of medication coordination  call.  No OVs, or hospital visits since last care coordination call/Pharmacist visit.   No medication changes indicated OR if recent visit, treatment plan here.  BP Readings from Last 3 Encounters:  03/20/22 (!) 167/74  01/17/22 (!) 140/52  12/26/21 (!) 181/74    Lab Results  Component Value Date   HGBA1C 8.6 (H) 12/23/2021     Patient obtains medications through Adherence Packaging  30 Days   Last adherence delivery included:  Aspirin 81mg  1 at EM Atorvastatin 20mg  1 at EM HCTZ 12.5mg  1 at B Lisinopril 20mg  1 at B 1 at EM Metformin 1000mg  1 at B 1 at EM Clopidogrel 75mg  1 at B Gabapentin 300mg  2 at B, 2 at L, 2 at EM  Patient declined (meds) last month  Unable to reach  Patient is due for next adherence delivery on: 04/13/22. Called patient and reviewed medications and coordinated delivery.  This delivery to include: Aspirin 81mg  1 at EM Atorvastatin 20mg  1 at EM HCTZ 12.5mg  1 at B Lisinopril 20mg  1 at B 1 at EM Metformin 1000mg  1 at B 1 at EM Clopidogrel 75mg  1 at B Gabapentin 300mg  2 at B, 2 at L, 2 at EM  Patient declined the following medications  Glimepiride 4mg - Picked up at [facility] 03/06/22 90ds 100units-This has been D/C due to hypotension  Rybelsus 7mg - Picked up at Dr. office last week for 90ds   Patient needs refills Gabapentin 300mg    Confirmed delivery date of 04/13/22, advised patient that pharmacy will contact them the morning of delivery.  Sugar Readings in AM 03/31/22 114, 04/01/22 80,  04/02/22 82,  04/03/22 116  Pt reports hypoglycemia symptoms, dizzy, weakness, sweat, shaky  No sugars dropping below 70 in the last week    Elray Mcgregor, [place] Pharmacist Assistant  [phone]

## 2022-04-04 ENCOUNTER — Other Ambulatory Visit (HOSPITAL_COMMUNITY): Payer: Self-pay

## 2022-04-04 DIAGNOSIS — M5137 Other intervertebral disc degeneration, lumbosacral region: Secondary | ICD-10-CM | POA: Diagnosis not present

## 2022-04-04 DIAGNOSIS — Z79891 Long term (current) use of opiate analgesic: Secondary | ICD-10-CM | POA: Diagnosis not present

## 2022-04-04 DIAGNOSIS — M47818 Spondylosis without myelopathy or radiculopathy, sacral and sacrococcygeal region: Secondary | ICD-10-CM | POA: Diagnosis not present

## 2022-04-04 DIAGNOSIS — G894 Chronic pain syndrome: Secondary | ICD-10-CM | POA: Diagnosis not present

## 2022-04-04 DIAGNOSIS — M549 Dorsalgia, unspecified: Secondary | ICD-10-CM | POA: Diagnosis not present

## 2022-04-04 DIAGNOSIS — M5136 Other intervertebral disc degeneration, lumbar region: Secondary | ICD-10-CM | POA: Diagnosis not present

## 2022-04-04 DIAGNOSIS — M169 Osteoarthritis of hip, unspecified: Secondary | ICD-10-CM | POA: Diagnosis not present

## 2022-04-04 DIAGNOSIS — M47816 Spondylosis without myelopathy or radiculopathy, lumbar region: Secondary | ICD-10-CM | POA: Diagnosis not present

## 2022-04-04 DIAGNOSIS — M25552 Pain in left hip: Secondary | ICD-10-CM | POA: Diagnosis not present

## 2022-04-04 DIAGNOSIS — Z1389 Encounter for screening for other disorder: Secondary | ICD-10-CM | POA: Diagnosis not present

## 2022-04-04 DIAGNOSIS — M48061 Spinal stenosis, lumbar region without neurogenic claudication: Secondary | ICD-10-CM | POA: Diagnosis not present

## 2022-04-04 MED ORDER — HYDROCODONE-ACETAMINOPHEN 10-325 MG PO TABS
1.0000 | ORAL_TABLET | Freq: Three times a day (TID) | ORAL | 0 refills | Status: DC | PRN
  Filled 2022-04-04 – 2022-04-26 (×2): qty 90, 30d supply, fill #0

## 2022-04-06 ENCOUNTER — Other Ambulatory Visit: Payer: Self-pay | Admitting: Legal Medicine

## 2022-04-06 DIAGNOSIS — E0840 Diabetes mellitus due to underlying condition with diabetic neuropathy, unspecified: Secondary | ICD-10-CM

## 2022-04-26 ENCOUNTER — Other Ambulatory Visit (HOSPITAL_COMMUNITY): Payer: Self-pay

## 2022-04-27 ENCOUNTER — Other Ambulatory Visit (HOSPITAL_COMMUNITY): Payer: Self-pay

## 2022-04-27 DIAGNOSIS — M5136 Other intervertebral disc degeneration, lumbar region: Secondary | ICD-10-CM | POA: Diagnosis not present

## 2022-04-27 DIAGNOSIS — M47818 Spondylosis without myelopathy or radiculopathy, sacral and sacrococcygeal region: Secondary | ICD-10-CM | POA: Diagnosis not present

## 2022-04-27 DIAGNOSIS — M48061 Spinal stenosis, lumbar region without neurogenic claudication: Secondary | ICD-10-CM | POA: Diagnosis not present

## 2022-04-27 DIAGNOSIS — Z1389 Encounter for screening for other disorder: Secondary | ICD-10-CM | POA: Diagnosis not present

## 2022-04-27 DIAGNOSIS — M169 Osteoarthritis of hip, unspecified: Secondary | ICD-10-CM | POA: Diagnosis not present

## 2022-04-27 DIAGNOSIS — M47816 Spondylosis without myelopathy or radiculopathy, lumbar region: Secondary | ICD-10-CM | POA: Diagnosis not present

## 2022-04-27 DIAGNOSIS — G894 Chronic pain syndrome: Secondary | ICD-10-CM | POA: Diagnosis not present

## 2022-04-27 DIAGNOSIS — M5137 Other intervertebral disc degeneration, lumbosacral region: Secondary | ICD-10-CM | POA: Diagnosis not present

## 2022-04-27 DIAGNOSIS — M549 Dorsalgia, unspecified: Secondary | ICD-10-CM | POA: Diagnosis not present

## 2022-04-27 DIAGNOSIS — Z79891 Long term (current) use of opiate analgesic: Secondary | ICD-10-CM | POA: Diagnosis not present

## 2022-04-27 DIAGNOSIS — M25552 Pain in left hip: Secondary | ICD-10-CM | POA: Diagnosis not present

## 2022-04-27 MED ORDER — HYDROCODONE-ACETAMINOPHEN 10-325 MG PO TABS
1.0000 | ORAL_TABLET | Freq: Three times a day (TID) | ORAL | 0 refills | Status: DC | PRN
  Filled 2022-05-25 (×2): qty 90, 30d supply, fill #0

## 2022-04-28 ENCOUNTER — Other Ambulatory Visit (HOSPITAL_COMMUNITY): Payer: Self-pay

## 2022-05-03 ENCOUNTER — Telehealth: Payer: Self-pay

## 2022-05-03 NOTE — Chronic Care Management (AMB) (Signed)
Chronic Care Management Pharmacy Assistant   Name: Andrew Wiggins  MRN: [id number] DOB: 07/14/1955  Reason for Encounter: Medication Review/ Medication coordination  Recent office visits:  None  Recent consult visits:  None  Hospital visits:  None in previous 6 months  Medications: Outpatient Encounter Medications as of 05/03/2022  Medication Sig   atorvastatin (LIPITOR) 20 MG tablet TAKE ONE TABLET BY MOUTH EVERY EVENING   clopidogrel (PLAVIX) 75 MG tablet TAKE ONE TABLET BY MOUTH EVERY MORNING   gabapentin (NEURONTIN) 300 MG capsule TAKE TWO CAPSULES BY MOUTH THREE TIMES DAILY   glimepiride (AMARYL) 4 MG tablet Take 1 tablet by mouth once daily   hydrochlorothiazide (MICROZIDE) 12.5 MG capsule TAKE ONE CAPSULE BY MOUTH EVERY MORNING   HYDROcodone-acetaminophen (NORCO) 10-325 MG tablet Take 1 tablet by mouth 3 (three) times daily.   HYDROcodone-acetaminophen (NORCO) 10-325 MG tablet Take 1 tablet by mouth 3 (three) times daily as needed for back pain   HYDROcodone-acetaminophen (NORCO) 10-325 MG tablet Take 1 tablet by mouth 3 (three) times daily as needed for back pain.   [START ON 05/25/2022] HYDROcodone-acetaminophen (NORCO) 10-325 MG tablet Take 1 tablet by mouth 3 (three) times daily as needed for back pain   insulin degludec (TRESIBA FLEXTOUCH) 100 UNIT/ML FlexTouch Pen Inject 10 Units into the skin daily. (Patient not taking: Reported on 01/17/2022)   lisinopril (ZESTRIL) 20 MG tablet TAKE ONE TABLET BY MOUTH EVERY MORNING and TAKE ONE TABLET BY MOUTH EVERY EVENING   metFORMIN (GLUCOPHAGE) 1000 MG tablet Take 1 tablet (1,000 mg total) by mouth 2 (two) times daily with a meal.   Omega-3 Fatty Acids (FISH OIL) 1000 MG CAPS Take 2 capsules (2,000 mg total) by mouth 2 (two) times daily.   Semaglutide (RYBELSUS) 7 MG TABS Take 7 mg by mouth daily. (Patient not taking: Reported on 01/17/2022)   No facility-administered encounter medications on file as of 05/03/2022.  Reviewed  chart for medication changes ahead of medication coordination call.  No OVs, Consults, or hospital visits since last care coordination call/Pharmacist visit.   No medication changes indicated   BP Readings from Last 3 Encounters:  03/20/22 (!) 167/74  01/17/22 (!) 140/52  12/26/21 (!) 181/74    Lab Results  Component Value Date   HGBA1C 8.6 (H) 12/23/2021     Patient obtains medications through Adherence Packaging  30 Days   Last adherence delivery included:  Aspirin 81mg  1 at EM Atorvastatin 20mg  1 at EM HCTZ 12.5mg  1 at B Lisinopril 20mg  1 at B 1 at EM Metformin 1000mg  1 at B 1 at EM Clopidogrel 75mg  1 at B Gabapentin 300mg  2 at B, 2 at L, 2 at EM  Patient declined (meds) last month: Glimepiride 4mg - Picked up at [facility] 03/06/22 90ds Tyler Aas 100units-This has been D/C due to hypotension  Rybelsus 7mg - Picked up at Dr. Tobie Poet office last week for 90ds   Patient is due for next adherence delivery on: 05-15-2021  Called patient and reviewed medications and coordinated delivery.  This delivery to include: Aspirin 81mg  1 at EM Atorvastatin 20mg  1 at EM HCTZ 12.5mg  1 at B Lisinopril 20mg  1 at B 1 at EM Metformin 1000mg  1 at B 1 at EM Clopidogrel 75mg  1 at B Gabapentin 300mg  2 at B, 2 at L, 2 at EM   Patient declined the following medications: Unable to contact patient  Patient needs refills for: Sent to PCP Gabapentin   05-03-2022: 1st attempt left VM 05-04-2022: 2nd attempt  left VM 05-05-2022: 3rd attempt left VM  confirm delivery date of 05-15-2021, pharmacy will contact them the morning of delivery.  Care Gaps: Covid vaccine overdue Hep C screening overdue Tdap overdue Colonoscopy overdue Shingrix overdue PNA Vac overdue AWV overdue  Star Rating Drugs: Atorvastatin 20 mg- Last filled 04-10-2022 30 DS. Previous 03-10-2022 30 DS Lisinopril 20 mg- Last filled 04-10-2022 30 DS. Previous 03-10-2022 30 DS Metformin 1000 mg- Last filled 04-10-2022 30 DS.  Previous 03-10-2022 30 DS   [place] [facility] Clinical Pharmacist Assistant [phone]

## 2022-05-25 ENCOUNTER — Other Ambulatory Visit (HOSPITAL_COMMUNITY): Payer: Self-pay

## 2022-05-26 ENCOUNTER — Ambulatory Visit: Payer: PPO | Attending: Cardiology

## 2022-05-26 DIAGNOSIS — I442 Atrioventricular block, complete: Secondary | ICD-10-CM

## 2022-05-26 LAB — CUP PACEART REMOTE DEVICE CHECK
Battery Remaining Longevity: 145 mo
Battery Voltage: 3.06 V
Brady Statistic AP VP Percent: 2.34 %
Brady Statistic AP VS Percent: 0 %
Brady Statistic AS VP Percent: 97.59 %
Brady Statistic AS VS Percent: 0.08 %
Brady Statistic RA Percent Paced: 2.35 %
Brady Statistic RV Percent Paced: 99.92 %
Date Time Interrogation Session: 20240118230412
Implantable Lead Connection Status: 753985
Implantable Lead Connection Status: 753985
Implantable Lead Implant Date: 20091120
Implantable Lead Implant Date: 20091120
Implantable Lead Location: 753859
Implantable Lead Location: 753860
Implantable Lead Model: 5076
Implantable Lead Model: 5076
Implantable Pulse Generator Implant Date: 20230118
Lead Channel Impedance Value: 266 Ohm
Lead Channel Impedance Value: 361 Ohm
Lead Channel Impedance Value: 684 Ohm
Lead Channel Impedance Value: 741 Ohm
Lead Channel Pacing Threshold Amplitude: 0.75 V
Lead Channel Pacing Threshold Amplitude: 0.75 V
Lead Channel Pacing Threshold Pulse Width: 0.4 ms
Lead Channel Pacing Threshold Pulse Width: 0.4 ms
Lead Channel Sensing Intrinsic Amplitude: 0.875 mV
Lead Channel Sensing Intrinsic Amplitude: 0.875 mV
Lead Channel Sensing Intrinsic Amplitude: 6.75 mV
Lead Channel Setting Pacing Amplitude: 1.5 V
Lead Channel Setting Pacing Amplitude: 2 V
Lead Channel Setting Pacing Pulse Width: 0.4 ms
Lead Channel Setting Sensing Sensitivity: 0.9 mV
Zone Setting Status: 755011
Zone Setting Status: 755011

## 2022-05-31 ENCOUNTER — Other Ambulatory Visit (HOSPITAL_COMMUNITY): Payer: Self-pay

## 2022-05-31 DIAGNOSIS — Z79891 Long term (current) use of opiate analgesic: Secondary | ICD-10-CM | POA: Diagnosis not present

## 2022-05-31 DIAGNOSIS — M25552 Pain in left hip: Secondary | ICD-10-CM | POA: Diagnosis not present

## 2022-05-31 DIAGNOSIS — G894 Chronic pain syndrome: Secondary | ICD-10-CM | POA: Diagnosis not present

## 2022-05-31 DIAGNOSIS — M48061 Spinal stenosis, lumbar region without neurogenic claudication: Secondary | ICD-10-CM | POA: Diagnosis not present

## 2022-05-31 DIAGNOSIS — M549 Dorsalgia, unspecified: Secondary | ICD-10-CM | POA: Diagnosis not present

## 2022-05-31 DIAGNOSIS — M5136 Other intervertebral disc degeneration, lumbar region: Secondary | ICD-10-CM | POA: Diagnosis not present

## 2022-05-31 DIAGNOSIS — M47816 Spondylosis without myelopathy or radiculopathy, lumbar region: Secondary | ICD-10-CM | POA: Diagnosis not present

## 2022-05-31 DIAGNOSIS — M169 Osteoarthritis of hip, unspecified: Secondary | ICD-10-CM | POA: Diagnosis not present

## 2022-05-31 DIAGNOSIS — M5137 Other intervertebral disc degeneration, lumbosacral region: Secondary | ICD-10-CM | POA: Diagnosis not present

## 2022-05-31 DIAGNOSIS — Z1389 Encounter for screening for other disorder: Secondary | ICD-10-CM | POA: Diagnosis not present

## 2022-05-31 DIAGNOSIS — M47818 Spondylosis without myelopathy or radiculopathy, sacral and sacrococcygeal region: Secondary | ICD-10-CM | POA: Diagnosis not present

## 2022-05-31 MED ORDER — HYDROCODONE-ACETAMINOPHEN 10-325 MG PO TABS: 1.0000 | ORAL_TABLET | Freq: Three times a day (TID) | ORAL | 0 refills | Status: DC | PRN

## 2022-06-01 ENCOUNTER — Telehealth: Payer: Self-pay

## 2022-06-01 NOTE — Progress Notes (Signed)
Care Management & Coordination Services Pharmacy Team  Reason for Encounter: Medication coordination and delivery  Contacted patient to discuss medications and coordinate delivery from [facility] pharmacy.  Cycle dispensing form sent to  for review.   Last adherence delivery date:05/15/22      Patient is due for next adherence delivery on: 06/13/22  Spoke with patient on 06/01/22 reviewed medications and coordinated delivery.  This delivery to include: Adherence Packaging  30 Days  Aspirin 81mg  1 at EM Atorvastatin 20mg  1 at EM HCTZ 12.5mg  1 at B Lisinopril 20mg  1 at B 1 at EM Metformin 1000mg  1 at B 1 at EM Clopidogrel 75mg  1 at B Gabapentin 300mg  2 at B, 2 at L, 2 at EM  Patient declined the following medications this month: Tresiba 100units- Pt is not taking currently  Glimepiride 4mg -Picking up at [facility] 7mg - Getting PAP. Picked up on 2 month ago for 90ds  Hydrocodone 10-325mg - Picked up at 05/26/22 30ds at [facility]  No refill request needed.  Confirmed delivery date of 06/13/22, advised patient that pharmacy will contact them the morning of delivery.  Any concerns about your medications? No  How often do you forget or accidentally miss a dose? Never  Do you use a pillbox? No  Is patient in packaging Yes  Any concerns or issues with your packaging? No concerns    Recent blood pressure readings are as follows:06/01/22 174/80  Recent blood glucose readings are as follows:05/29/22 117, 05/26/22 71  Chart review: Recent office visits:  None  Recent consult visits:  None  Hospital visits:  None  Medications: Outpatient Encounter Medications as of 06/01/2022  Medication Sig   atorvastatin (LIPITOR) 20 MG tablet TAKE ONE TABLET BY MOUTH EVERY EVENING   clopidogrel (PLAVIX) 75 MG tablet TAKE ONE TABLET BY MOUTH EVERY MORNING   gabapentin (NEURONTIN) 300 MG capsule TAKE TWO CAPSULES BY MOUTH THREE TIMES DAILY   glimepiride (AMARYL) 4 MG tablet Take 1 tablet  by mouth once daily   hydrochlorothiazide (MICROZIDE) 12.5 MG capsule TAKE ONE CAPSULE BY MOUTH EVERY MORNING   HYDROcodone-acetaminophen (NORCO) 10-325 MG tablet Take 1 tablet by mouth 3 (three) times daily.   HYDROcodone-acetaminophen (NORCO) 10-325 MG tablet Take 1 tablet by mouth 3 (three) times daily as needed for back pain   HYDROcodone-acetaminophen (NORCO) 10-325 MG tablet Take 1 tablet by mouth 3 (three) times daily as needed for back pain.   HYDROcodone-acetaminophen (NORCO) 10-325 MG tablet Take 1 tablet by mouth 3 (three) times daily as needed for back pain   [START ON 06/23/2022] HYDROcodone-acetaminophen (NORCO) 10-325 MG tablet Take 1 tablet by mouth 3 (three) times daily as needed for back pain   insulin degludec (TRESIBA FLEXTOUCH) 100 UNIT/ML FlexTouch Pen Inject 10 Units into the skin daily. (Patient not taking: Reported on 01/17/2022)   lisinopril (ZESTRIL) 20 MG tablet TAKE ONE TABLET BY MOUTH EVERY MORNING and TAKE ONE TABLET BY MOUTH EVERY EVENING   metFORMIN (GLUCOPHAGE) 1000 MG tablet Take 1 tablet (1,000 mg total) by mouth 2 (two) times daily with a meal.   Omega-3 Fatty Acids (FISH OIL) 1000 MG CAPS Take 2 capsules (2,000 mg total) by mouth 2 (two) times daily.   Semaglutide (RYBELSUS) 7 MG TABS Take 7 mg by mouth daily. (Patient not taking: Reported on 01/17/2022)   No facility-administered encounter medications on file as of 06/01/2022.   BP Readings from Last 3 Encounters:  03/20/22 (!) 167/74  01/17/22 (!) 140/52  12/26/21 (!) 181/74  Pulse Readings from Last 3 Encounters:  03/20/22 (!) 105  01/17/22 (!) 103  12/26/21 85    Lab Results  Component Value Date/Time   HGBA1C 8.6 (H) 12/23/2021 09:28 AM   HGBA1C 9.5 (H) 06/17/2021 08:37 AM   Lab Results  Component Value Date   CREATININE 0.93 12/13/2021   BUN 10 12/13/2021   GFR 83.08 12/20/2017   GFRNONAA 90 11/18/2019   GFRAA 104 11/18/2019   NA 139 12/13/2021   K 4.0 12/13/2021   CALCIUM 10.1  12/13/2021   CO2 24 12/13/2021     Elray Mcgregor, [place] Clinical Pharmacist Assistant  [phone]

## 2022-06-02 ENCOUNTER — Ambulatory Visit: Payer: PPO | Admitting: Podiatry

## 2022-06-02 DIAGNOSIS — B351 Tinea unguium: Secondary | ICD-10-CM | POA: Diagnosis not present

## 2022-06-02 DIAGNOSIS — M79674 Pain in right toe(s): Secondary | ICD-10-CM

## 2022-06-02 DIAGNOSIS — I739 Peripheral vascular disease, unspecified: Secondary | ICD-10-CM

## 2022-06-02 DIAGNOSIS — M79675 Pain in left toe(s): Secondary | ICD-10-CM | POA: Diagnosis not present

## 2022-06-02 DIAGNOSIS — E1169 Type 2 diabetes mellitus with other specified complication: Secondary | ICD-10-CM

## 2022-06-02 DIAGNOSIS — I96 Gangrene, not elsewhere classified: Secondary | ICD-10-CM

## 2022-06-02 DIAGNOSIS — Z899 Acquired absence of limb, unspecified: Secondary | ICD-10-CM

## 2022-06-02 NOTE — Progress Notes (Signed)
  Subjective:  Patient ID: Andrew Wiggins, male    DOB: 1955/08/01,  MRN: [id number]  Chief Complaint  Patient presents with   Follow-up    F/u on gangrene of 4th toe , he states it feels like there is some swelling and very sensitive to the touch    67 y.o. male presents with the above complaint. History confirmed with patient. Patient presenting with pain related to dystrophic thickened elongated nails. Patient is unable to trim own nails related to nail dystrophy and/or mobility issues. 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 Denies any redness swelling or drainage of the left 4th toe stump. Denies N/V/F/C.  Objective:  Physical Exam: warm, good capillary refill nail exam onychomycosis of the toenails, onycholysis, and dystrophic nails DP pulses palpable, PT pulses palpable, and protective sensation absent Left Foot:  Pain with palpation of nails due to elongation and dystrophic growth.  Amputation site of the left fourth toe well-healed no drainage open wound erythema edema or other issue there is mild hyperkeratotic tissue at the nail which was debrided with a nail. Right Foot: Pain with palpation of nails due to elongation and dystrophic growth.   Assessment:   1. Pain due to onychomycosis of toenails of both feet   2. Gangrene of toe of left foot ([name])   3. PAD (peripheral artery disease) ([name])   4. History of amputation   5. Type 2 diabetes mellitus with other specified complication, without long-term current use of insulin ([name])       Plan:  Patient was evaluated and treated and all questions answered.  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 -Continue to use some kind of a spacer device in between the fourth and fifth toe to prevent irritation between the 2. -Continue to monitor for any signs of infection or opening of the wound.  #Onychomycosis with pain  -Nails palliatively debrided as below. -Educated on self-care  Procedure: Nail Debridement Rationale: Pain Type of Debridement: manual, sharp debridement. Instrumentation: Nail nipper, rotary burr. Number of Nails: 10  Return in about 3 months (around 09/01/2022) for [name].         Everitt Amber, DPM [facility]

## 2022-06-05 ENCOUNTER — Other Ambulatory Visit: Payer: Self-pay

## 2022-06-05 DIAGNOSIS — E0840 Diabetes mellitus due to underlying condition with diabetic neuropathy, unspecified: Secondary | ICD-10-CM

## 2022-06-05 MED ORDER — GLIMEPIRIDE 4 MG PO TABS: 4.0000 mg | ORAL_TABLET | Freq: Every day | ORAL | 0 refills | Status: DC

## 2022-06-05 NOTE — Telephone Encounter (Signed)
Due for fasting follow up appt. Due for annual wellness visit. Please call office. Thank you, Dr. Tobie Poet

## 2022-06-06 ENCOUNTER — Other Ambulatory Visit: Payer: Self-pay

## 2022-06-06 DIAGNOSIS — E0840 Diabetes mellitus due to underlying condition with diabetic neuropathy, unspecified: Secondary | ICD-10-CM

## 2022-06-06 MED ORDER — GLIMEPIRIDE 4 MG PO TABS: 4.0000 mg | ORAL_TABLET | Freq: Every day | ORAL | 0 refills | Status: DC

## 2022-06-06 NOTE — Telephone Encounter (Signed)
Let me know if he agrees and we will send. Dr. Tobie Poet

## 2022-06-07 ENCOUNTER — Other Ambulatory Visit: Payer: Self-pay

## 2022-06-07 ENCOUNTER — Other Ambulatory Visit: Payer: Self-pay | Admitting: Family Medicine

## 2022-06-07 MED ORDER — VALSARTAN-HYDROCHLOROTHIAZIDE 160-12.5 MG PO TABS: 1.0000 | ORAL_TABLET | Freq: Every day | ORAL | 0 refills | Status: DC

## 2022-06-07 NOTE — Telephone Encounter (Signed)
Sent!

## 2022-06-07 NOTE — Telephone Encounter (Cosign Needed)
Spoke with pt and he is fine with switching medications to the Valsartan/HCTZ. Please send this to [facility] and we will coordinate this delivery.   Thank you!  Elray Mcgregor, [place] Pharmacist Assistant  [phone]

## 2022-06-07 NOTE — Telephone Encounter (Cosign Needed)
06/07/21- Called and had to leave vm to reach back out  Elray Mcgregor, [place] Pharmacist Assistant  [phone]

## 2022-06-08 ENCOUNTER — Other Ambulatory Visit: Payer: Self-pay

## 2022-06-09 ENCOUNTER — Other Ambulatory Visit: Payer: Self-pay | Admitting: Family Medicine

## 2022-06-09 ENCOUNTER — Other Ambulatory Visit: Payer: Self-pay

## 2022-06-09 MED ORDER — METFORMIN HCL 1000 MG PO TABS: 1000.0000 mg | ORAL_TABLET | Freq: Two times a day (BID) | ORAL | 0 refills | Status: DC

## 2022-06-12 NOTE — Progress Notes (Signed)
Remote pacemaker transmission.   

## 2022-06-20 NOTE — Progress Notes (Unsigned)
Subjective:  Patient ID: Andrew Wiggins, male    DOB: [birth year]  Age: 67 y.o. MRN: [id number]  No chief complaint on file.    History of Present illness:  53 yrs male is here for a regular follow up for Diabetes mellitis, Essential hypertension, COPD, hyperlipidemia. Patient is a current smoker.  He has COPD.  He takes a statin for hypercholesterolemia.  He is medically managed for hypertension.  He has type 2 diabetes complicated by neuropathy  Diabetes Mellitus Type II, follow-up  Lab Results  Component Value Date   HGBA1C 8.6 (H) 12/23/2021   HGBA1C 9.5 (H) 06/17/2021   HGBA1C 8.2 (H) 01/12/2021   Last seen for diabetes 6 months ago.  Management since then includes Glimiperide 4 mg daily, Metformin 500 mg  daily, Tresiba 10 units daily, Rybelsus 7 mg daily.   He reports {excellent/good/fair/poor:19665} compliance with treatment. He {is/is not:21021397} having side effects. {document side effects if present:1}  Home blood sugar records: {diabetes glucometry results:16657}  Episodes of hypoglycemia? {Yes/No:20286} {enter details if yes:1}   Current insulin regiment: {***Type 'None' if not taking insulin                                                otherwise enter complete                                                 details of insulin regiment:1} Most Recent Eye Exam: ***  --------------------------------------------------------------------------------------------------- Hypertension, follow-up  BP Readings from Last 3 Encounters:  03/20/22 (!) 167/74  01/17/22 (!) 140/52  12/26/21 (!) 181/74   Wt Readings from Last 3 Encounters:  03/20/22 147 lb 9.6 oz (67 kg)  01/17/22 148 lb 3.2 oz (67.2 kg)  12/26/21 146 lb (66.2 kg)     He was last seen for hypertension 6 months ago.  BP at that visit was ***. Management since that visit includes Hctz 12.5 mg daily, Lisinopril 20 mg daily Aspirin 81 mg  He reports good compliance with treatment. He is not having side  effects.  He is not exercising. He is not adherent to low salt diet.   Outside blood pressures are .  He does smoke.  Use of agents associated with hypertension: none.   --------------------------------------------------------------------------------------------------- Lipid/Cholesterol, follow-up  Last Lipid Panel: Lab Results  Component Value Date   CHOL 127 12/13/2021   LDLCALC 46 12/13/2021   LDLDIRECT 113.0 12/20/2017   HDL 29 (L) 12/13/2021   TRIG 346 (H) 12/13/2021    He was last seen for this 6 months ago.  Management since that visit includes  Atorvastatin 20 mg daily .  He reports good compliance with treatment. He is not having side effects.   He is following a Regular diet. Current exercise: {exercise [id number]  Last metabolic panel Lab Results  Component Value Date   GLUCOSE 218 (H) 12/13/2021   NA 139 12/13/2021   K 4.0 12/13/2021   BUN 10 12/13/2021   CREATININE 0.93 12/13/2021   GFRNONAA 90 11/18/2019   GFRAA 104 11/18/2019   CALCIUM 10.1 12/13/2021   AST 15 12/13/2021   ALT 14 12/13/2021   The ASCVD Risk score (Arnett DK, et al., 2019) failed to calculate  for the following reasons:   The valid total cholesterol range is 130 to 320 mg/dL  ---------------------------------------------------------------------------------------------------     Current Outpatient Medications on File Prior to Visit  Medication Sig Dispense Refill   valsartan-hydrochlorothiazide (DIOVAN-HCT) 160-12.5 MG tablet Take 1 tablet by mouth daily. 90 tablet 0   atorvastatin (LIPITOR) 20 MG tablet TAKE ONE TABLET BY MOUTH EVERY EVENING 90 tablet 2   clopidogrel (PLAVIX) 75 MG tablet TAKE ONE TABLET BY MOUTH EVERY MORNING 90 tablet 2   gabapentin (NEURONTIN) 300 MG capsule TAKE TWO CAPSULES BY MOUTH THREE TIMES DAILY 270 capsule 1   glimepiride (AMARYL) 4 MG tablet Take 1 tablet (4 mg total) by mouth daily. 30 tablet 0   HYDROcodone-acetaminophen (NORCO) 10-325 MG tablet  Take 1 tablet by mouth 3 (three) times daily as needed for back pain 90 tablet 0   [START ON 06/23/2022] HYDROcodone-acetaminophen (NORCO) 10-325 MG tablet Take 1 tablet by mouth 3 (three) times daily as needed for back pain 90 tablet 0   insulin degludec (TRESIBA FLEXTOUCH) 100 UNIT/ML FlexTouch Pen Inject 10 Units into the skin daily. (Patient not taking: Reported on 01/17/2022) 6 mL 3   metFORMIN (GLUCOPHAGE) 1000 MG tablet Take 1 tablet (1,000 mg total) by mouth 2 (two) times daily with a meal. 60 tablet 0   Omega-3 Fatty Acids (FISH OIL) 1000 MG CAPS Take 2 capsules (2,000 mg total) by mouth 2 (two) times daily. 180 capsule 12   Semaglutide (RYBELSUS) 7 MG TABS Take 7 mg by mouth daily. (Patient not taking: Reported on 01/17/2022) 90 tablet 1   No current facility-administered medications on file prior to visit.   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 ~  Jan11:  Last seen by Cards & pacer reprogrammed...     Cardiac pacemaker in situ    Carotid artery disease ([place]) 07/27/2010   R/O PERIPHERAL VASCULAR DISEASE (ICD-443.9) - on ASA 78m/d... exam 11/09 shows gr 1-2 sys murmur LSB, but also has prominent bruit to base of right side of his neck w/ right > left Carotid Bruit... ~  CDopplers 11/09 showed severe plaque right ICA, & min plaque on left... no signif ICA stenoses per report. 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 smokes 1/2 - 1ppd x yrs... ~   PFT's 11/08 showed FVC= 3.85 (79%), FEV1= 2.19 (55%), %1sec= 57, mid-flows= 32% predicted:  all c/w mod airflow obstruction...  ~  CXR 12/10 showed left subclav pacer, mild hyperinflation, NAD...     Dermatitis, contact    due to poison ivy   Diabetes mellitus ([place] 02/04/2020   Diabetes mellitus with neuropathy ([place] 12/20/2016   Dyslipidemia 03/06/2012   Essential hypertension 04/10/2007    HYPERTENSION (ICD-401.9) - prev on Micardis 85md then switched to Losartan 10041m, but he lost his job in 2011& couldn't afford Rx;  He called us [place] w/ this news & we phoned in Lisinopril 45m42m.. ~  2DEcho  11/09 showed sl incr LVwall thickness, norm LVF w/ EF= 55-60%, no regional wall motion abn, mild DD, mild MR... ~  Myoview 12/09 showed LBBB, no perfusion defects, abn septal motion & EF   GERD 03/26/2008    GERD (ICD-530.81) - prev mild GERD symptoms that improved after cholecystectomy on 2000... uses OTC PPI as needed... ~  he is due for routine screening colonoscopy...       Mixed hyperlipidemia 06/19/2019   PVD (peripheral vascular disease) ([place])    Sciatica 11/18/2019   Tobacco use 02/04/2020   Tobacco use disorder    Past Surgical History:  Procedure Laterality Date   ABDOMINAL AORTOGRAM W/LOWER EXTREMITY N/A 12/14/2020   Procedure: ABDOMINAL AORTOGRAM W/LOWER EXTREMITY;  Surgeon: [name], MD;  Location: [facility];  Service: Cardiovascular;  Laterality: N/A;   ABDOMINAL AORTOGRAM W/LOWER EXTREMITY N/A 05/24/2021   Procedure: ABDOMINAL AORTOGRAM W/LOWER EXTREMITY;  Surgeon: [name], MD;  Location: [facility];  Service: Cardiovascular;  Laterality: N/A;   laproscopic cholecystectomy  2000   by Dr. Abran Cantor   PACEMAKER INSERTION  03/2008   by Dr. Lovena Le   PERIPHERAL VASCULAR INTERVENTION Left 12/14/2020   Procedure: [place];  Surgeon: [name], MD;  Location: [facility];  Service: Cardiovascular;  Laterality: Left;  SFA    PERIPHERAL VASCULAR INTERVENTION Left 05/24/2021   Procedure: PERIPHERAL VASCULAR INTERVENTION;  Surgeon: [name], MD;  Location: [facility];  Service: Cardiovascular;  Laterality: Left;   PPM GENERATOR CHANGEOUT N/A 05/25/2021   Procedure: PPM GENERATOR CHANGEOUT;  Surgeon: [name], MD;  Location: [facility];  Service: Cardiovascular;  Laterality: N/A;    Family History  Problem Relation Age of Onset   Diabetes Brother    Social History   Socioeconomic History   Marital status: Legally Separated    Spouse name: Not on file   Number of children: 2   Years of education: Not on file   Highest education level: Not on file  Occupational History   Occupation: unemployed    Comment: works for himself  Tobacco Use   Smoking status: Every Day    Packs/day: 0.50    Years: 25.00    Total pack years: 12.50    Types: Cigarettes    Passive exposure: Never   Smokeless tobacco: Never  Vaping Use   Vaping Use: Never used  Substance and Sexual Activity   Alcohol use: Not Currently    Alcohol/week: 0.0 standard drinks of alcohol   Drug use: No   Sexual activity: Not Currently  Other Topics Concern   Not on file  Social History Narrative   2 siblings died with leukemia around age 27   Social Determinants of Health   Financial Resource Strain: [place]  (12/21/2021)   Overall Financial Resource Strain (CARDIA)    Difficulty of Paying Living Expenses: Not hard at all  Food Insecurity: No Food Insecurity (06/16/2020)   Hunger Vital Sign    Worried About Running Out of Food in the Last Year: Never true    [place] in the Last Year: Never true  Transportation Needs: No Transportation Needs (12/21/2021)   PRAPARE - Hydrologist (Medical): No    Lack of Transportation (Non-Medical): No  Physical Activity: Inactive (06/16/2020)   Exercise Vital Sign    Days of Exercise per Week: 0 days    Minutes of Exercise  per Session:  30 min  Stress: Stress Concern Present (06/16/2020)   [place]    Feeling of Stress : To some extent  Social Connections: Socially Isolated (06/16/2020)   Social Connection and Isolation Panel [NHANES]    Frequency of Communication with Friends and Family: Three times a week    Frequency of Social Gatherings with Friends and Family: Never    Attends Religious Services: Never    Marine scientist or Organizations: No    Attends Archivist Meetings: Never    Marital Status: Divorced    Review of Systems   Objective:  There were no vitals taken for this visit.     03/20/2022    1:48 PM 01/17/2022    2:04 PM 12/26/2021    1:59 PM  BP/Weight  Systolic BP [id number]  Diastolic BP 74 52 74  Wt. (Lbs) 147.6 148.2 146  BMI 21.18 kg/m2 21.26 kg/m2 20.36 kg/m2    Physical Exam  Diabetic Foot Exam - Simple   No data filed        01/26/2021   10:46 AM 01/12/2021   10:20 AM 06/16/2020    1:55 PM 11/18/2019    9:46 AM 10/24/2013   10:00 AM  Depression screen PHQ 2/9  Decreased Interest 0 0 0 0 0  Down, Depressed, Hopeless 0 0 0 0 0  PHQ - 2 Score 0 0 0 0 0       11/18/2019   10:02 AM 06/16/2020    2:00 PM 01/12/2021   10:20 AM 01/26/2021   10:46 AM  Fall Risk  Falls in the past year? 0 0 0 0  Was there an injury with Fall? 0 0 0 0  Fall Risk Category Calculator 0 0 0 0  Fall Risk Category (Retired) Low Low Low Low  (RETIRED) Patient Fall Risk Level Low fall risk  Low fall risk Low fall risk  Fall risk Follow up Falls evaluation completed       Lab Results  Component Value Date   WBC 10.6 12/23/2021   HGB 11.0 (L) 12/23/2021   HCT 34.6 (L) 12/23/2021   PLT 278 12/23/2021   GLUCOSE 218 (H) 12/13/2021   CHOL 127 12/13/2021   TRIG 346 (H) 12/13/2021   HDL 29 (L) 12/13/2021   LDLDIRECT 113.0 12/20/2017   LDLCALC 46 12/13/2021   ALT 14 12/13/2021   AST 15 12/13/2021   NA 139 12/13/2021   K 4.0  12/13/2021   CL 100 12/13/2021   CREATININE 0.93 12/13/2021   BUN 10 12/13/2021   CO2 24 12/13/2021   TSH 1.46 12/20/2017   PSA 0.63 12/20/2017   HGBA1C 8.6 (H) 12/23/2021   MICROALBUR 2.9 (H) 02/07/2017      Assessment & Plan:   There are no diagnoses linked to this encounter.   Follow-up: No follow-ups on file.  An After Visit Summary was printed and given to the patient.  I, Neil Crouch have reviewed all documentation for this visit. The documentation on 06/20/22   for the exam, diagnosis, procedures, and orders are all accurate and complete.    Neil Crouch, DNP, [facility] [phone]

## 2022-06-21 ENCOUNTER — Telehealth: Payer: Self-pay

## 2022-06-21 ENCOUNTER — Ambulatory Visit (INDEPENDENT_AMBULATORY_CARE_PROVIDER_SITE_OTHER): Payer: PPO | Admitting: Nurse Practitioner

## 2022-06-21 ENCOUNTER — Encounter: Payer: Self-pay | Admitting: Nurse Practitioner

## 2022-06-21 VITALS — BP 130/70 | HR 88 | Temp 97.2°F | Resp 16 | Ht 70.0 in | Wt 146.0 lb

## 2022-06-21 DIAGNOSIS — E782 Mixed hyperlipidemia: Secondary | ICD-10-CM

## 2022-06-21 DIAGNOSIS — I10 Essential (primary) hypertension: Secondary | ICD-10-CM

## 2022-06-21 DIAGNOSIS — E1140 Type 2 diabetes mellitus with diabetic neuropathy, unspecified: Secondary | ICD-10-CM

## 2022-06-21 DIAGNOSIS — E0840 Diabetes mellitus due to underlying condition with diabetic neuropathy, unspecified: Secondary | ICD-10-CM

## 2022-06-21 DIAGNOSIS — Z720 Tobacco use: Secondary | ICD-10-CM

## 2022-06-21 DIAGNOSIS — Z125 Encounter for screening for malignant neoplasm of prostate: Secondary | ICD-10-CM

## 2022-06-21 DIAGNOSIS — J4489 Other specified chronic obstructive pulmonary disease: Secondary | ICD-10-CM | POA: Diagnosis not present

## 2022-06-21 DIAGNOSIS — D500 Iron deficiency anemia secondary to blood loss (chronic): Secondary | ICD-10-CM

## 2022-06-21 DIAGNOSIS — E785 Hyperlipidemia, unspecified: Secondary | ICD-10-CM | POA: Diagnosis not present

## 2022-06-21 HISTORY — DX: Encounter for screening for malignant neoplasm of prostate: Z12.5

## 2022-06-21 HISTORY — DX: Iron deficiency anemia secondary to blood loss (chronic): D50.0

## 2022-06-21 MED ORDER — ALBUTEROL SULFATE HFA 108 (90 BASE) MCG/ACT IN AERS: 2.0000 | INHALATION_SPRAY | Freq: Four times a day (QID) | RESPIRATORY_TRACT | 2 refills | Status: DC | PRN

## 2022-06-21 MED ORDER — IRON (FERROUS SULFATE) 325 (65 FE) MG PO TABS: 325.0000 mg | ORAL_TABLET | Freq: Every day | ORAL | 0 refills | Status: DC

## 2022-06-21 MED ORDER — RYBELSUS 7 MG PO TABS: 7.0000 mg | ORAL_TABLET | Freq: Every day | ORAL | 1 refills | Status: DC

## 2022-06-21 NOTE — Assessment & Plan Note (Signed)
Well controlled Taking diovan-hct 160-12.5 mg OD  Nutrition: Stressed importance of moderation in sodium intake, saturated fat and cholesterol, caloric balance, sufficient intake of complex carbohydrates, fiber, calcium and iron.   Exercise: Stressed the importance of regular exercise.

## 2022-06-21 NOTE — Assessment & Plan Note (Signed)
Smoking cessation discussed and refused any smoking cessation medicines.

## 2022-06-21 NOTE — Assessment & Plan Note (Signed)
Well controlled  Albuterol inhaler refilled

## 2022-06-21 NOTE — Progress Notes (Signed)
  Chronic Care Management   Note  06/21/2022 Name: Andrew Wiggins MRN: [id number] DOB: Sep 01, 1955  Andrew Wiggins is a 67 y.o. year old male who is a primary care patient of [name], MD (Inactive). I reached out to [employer] by phone today in response to a referral sent by [name]'s PCP.  The first contact attempt was unsuccessful.   Follow up plan: Additional outreach attempts will be made.  Andrew Wiggins, [place], [place] [address] Direct Dial: [phone] [email or web address]

## 2022-06-21 NOTE — Assessment & Plan Note (Signed)
Taking Rosuvastatin 20 mg OD  Nutrition: Stressed importance of moderation in sodium intake, saturated fat and cholesterol, caloric balance, sufficient intake of complex carbohydrates, fiber, calcium and iron.   Exercise: Stressed the importance of regular exercise.

## 2022-06-21 NOTE — Assessment & Plan Note (Addendum)
Iron supplements prescribed iron rich foods discussed Iron profile will be checked and will refer to the hematology if it is still in lower side

## 2022-06-21 NOTE — Patient Instructions (Signed)
Iron Deficiency Anemia, Adult  Iron deficiency anemia is a condition in which the concentration of red blood cells or hemoglobin in the blood is below normal because of too little iron. Hemoglobin is a substance in red blood cells that carries oxygen to the body's tissues. When the concentration of red blood cells or hemoglobin is too low, not enough oxygen reaches these tissues. Iron deficiency anemia is usually long-lasting, and it develops over time. It may or may not cause symptoms. It is a common type of anemia. What are the causes? This condition may be caused by: Not enough iron in the diet. Abnormal absorption in the gut. Blood loss. What increases the risk? You are more likely to develop this condition if you get menstrual periods (menstruate) or are pregnant. What are the signs or symptoms? Symptoms of this condition may include: Pale skin, lips, and nail beds. Weakness, dizziness, and getting tired easily. Shortness of breath when moving or exercising. Cold hands or feet. Mild anemia may not cause any symptoms. How is this diagnosed? This condition is diagnosed based on: Your medical history. A physical exam. Blood tests. How is this treated? This condition is treated by correcting the cause of your iron deficiency. Treatment may involve: Adding iron-rich foods to your diet. Taking iron supplements. If you are pregnant or breastfeeding, you may need to take extra iron because your normal diet usually does not provide the amount of iron that you need. Increasing vitamin C intake. Vitamin C helps your body absorb iron. Your health care provider may recommend that you take iron supplements along with a glass of orange juice or a vitamin C supplement. Medicines to make heavy menstrual flow lighter. Surgery or additional testing procedures to determine the cause of your anemia. You may need repeat blood tests to determine whether treatment is working. If the treatment does not  seem to be working, you may need more tests. Follow these instructions at home: Medicines Take over-the-counter and prescription medicines only as told by your health care provider. This includes iron supplements and vitamins. This is important because too much iron can be harmful. For the best iron absorption, you should take iron supplements when your stomach is empty. If you cannot tolerate them on an empty stomach, you may need to take them with food. Do not drink milk or take antacids at the same time as your iron supplements. Milk and antacids may interfere with how your body absorbs iron. Iron supplements may turn stool (feces) a darker color and it may appear black. If you cannot tolerate taking iron supplements by mouth, talk with your health care provider about taking them through an IV or through an injection into a muscle. Eating and drinking Talk with your health care provider before changing your diet. Your provider may recommend that you eat foods that contain a lot of iron, such as: Liver. Low-fat (lean) beef. Breads and cereals that have iron added to them (are fortified). Eggs. Dried fruit. Dark green, leafy vegetables. To help your body use the iron from iron-rich foods, eat those foods at the same time as fresh fruits and vegetables that are high in vitamin C. Foods that are high in vitamin C include: Oranges. Peppers. Tomatoes. Mangoes. Managing constipation If you are taking an iron supplement, it may cause constipation. To prevent or treat constipation, you may need to: Drink enough fluid to keep your urine pale yellow. Take over-the-counter or prescription medicines. Eat foods that are high in fiber, such   as beans, whole grains, and fresh fruits and vegetables. Limit foods that are high in fat and processed sugars, such as fried or sweet foods. General instructions Return to your normal activities as told by your health care provider. Ask your health care provider  what activities are safe for you. Keep all follow-up visits. Contact a health care provider if: You feel nauseous or you vomit. You feel weak. You become light-headed when getting up from a sitting or lying down position. You have unexplained sweating. You develop symptoms of constipation. You have a heaviness in your chest. You have trouble breathing with physical activity. Get help right away if: You faint. If this happens, do not drive yourself to the hospital. You have an irregular or rapid heartbeat. Summary Iron deficiency anemia is a condition in which the concentration of red blood cells or hemoglobin in the blood is below normal because of too little iron. This condition is treated by correcting the cause of your iron deficiency. Take over-the-counter and prescription medicines only as told by your health care provider. This includes iron supplements and vitamins. To help your body use the iron from iron-rich foods, eat those foods at the same time as fresh fruits and vegetables that are high in vitamin C. Seek medical help if you have signs or symptoms of worsening anemia. This information is not intended to replace advice given to you by your health care provider. Make sure you discuss any questions you have with your health care provider. Document Revised: 06/01/2021 Document Reviewed: 06/01/2021 Elsevier Patient Education  2023 Elsevier Inc.  

## 2022-06-21 NOTE — Assessment & Plan Note (Signed)
Blood sugar controlled with Metformin 1000 mg BD, Glimepiride 4 mg OD and Rybelsus 7 mg OD  Referral to chronic care management to help him to get Rybelsus since patient cannot afford it

## 2022-06-22 ENCOUNTER — Other Ambulatory Visit: Payer: Self-pay | Admitting: Nurse Practitioner

## 2022-06-22 DIAGNOSIS — D500 Iron deficiency anemia secondary to blood loss (chronic): Secondary | ICD-10-CM

## 2022-06-22 LAB — CBC WITH DIFFERENTIAL/PLATELET
Basophils Absolute: 0 10*3/uL (ref 0.0–0.2)
Basos: 0 %
EOS (ABSOLUTE): 0.2 10*3/uL (ref 0.0–0.4)
Eos: 2 %
Hematocrit: 36.3 % — ABNORMAL LOW (ref 37.5–51.0)
Hemoglobin: 10.3 g/dL — ABNORMAL LOW (ref 13.0–17.7)
Immature Grans (Abs): 0.1 10*3/uL (ref 0.0–0.1)
Immature Granulocytes: 1 %
Lymphocytes Absolute: 2.9 10*3/uL (ref 0.7–3.1)
Lymphs: 29 %
MCH: 23.5 pg — ABNORMAL LOW (ref 26.6–33.0)
MCHC: 28.4 g/dL — ABNORMAL LOW (ref 31.5–35.7)
MCV: 83 fL (ref 79–97)
Monocytes Absolute: 0.8 10*3/uL (ref 0.1–0.9)
Monocytes: 8 %
Neutrophils Absolute: 6.1 10*3/uL (ref 1.4–7.0)
Neutrophils: 60 %
Platelets: 331 10*3/uL (ref 150–450)
RBC: 4.39 x10E6/uL (ref 4.14–5.80)
RDW: 23.7 % — ABNORMAL HIGH (ref 11.6–15.4)
WBC: 10 10*3/uL (ref 3.4–10.8)

## 2022-06-22 LAB — LIPID PANEL
Chol/HDL Ratio: 3 ratio (ref 0.0–5.0)
Cholesterol, Total: 100 mg/dL (ref 100–199)
HDL: 33 mg/dL — ABNORMAL LOW (ref >39)
LDL Chol Calc (NIH): 40 mg/dL (ref 0–99)
Triglycerides: 157 mg/dL — ABNORMAL HIGH (ref 0–149)
VLDL Cholesterol Cal: 27 mg/dL (ref 5–40)

## 2022-06-22 LAB — COMPREHENSIVE METABOLIC PANEL
ALT: 10 IU/L (ref 0–44)
AST: 16 IU/L (ref 0–40)
Albumin/Globulin Ratio: 1.9 (ref 1.2–2.2)
Albumin: 4.6 g/dL (ref 3.9–4.9)
Alkaline Phosphatase: 116 IU/L (ref 44–121)
BUN/Creatinine Ratio: 15 (ref 10–24)
BUN: 14 mg/dL (ref 8–27)
Bilirubin Total: <0.2 mg/dL (ref 0.0–1.2)
CO2: 22 mmol/L (ref 20–29)
Calcium: 9.6 mg/dL (ref 8.6–10.2)
Chloride: 104 mmol/L (ref 96–106)
Creatinine, Ser: 0.92 mg/dL (ref 0.76–1.27)
Globulin, Total: 2.4 g/dL (ref 1.5–4.5)
Glucose: 119 mg/dL — ABNORMAL HIGH (ref 70–99)
Potassium: 5.2 mmol/L (ref 3.5–5.2)
Sodium: 144 mmol/L (ref 134–144)
Total Protein: 7 g/dL (ref 6.0–8.5)
eGFR: 91 mL/min/{1.73_m2} (ref >59)

## 2022-06-22 LAB — TSH: TSH: 1.89 u[IU]/mL (ref 0.450–4.500)

## 2022-06-22 LAB — IRON,TIBC AND FERRITIN PANEL
Ferritin: 25 ng/mL — ABNORMAL LOW (ref 30–400)
Iron Saturation: 10 % — ABNORMAL LOW (ref 15–55)
Iron: 45 ug/dL (ref 38–169)
Total Iron Binding Capacity: 465 ug/dL — ABNORMAL HIGH (ref 250–450)
UIBC: 420 ug/dL — ABNORMAL HIGH (ref 111–343)

## 2022-06-22 LAB — PSA, TOTAL AND FREE
PSA, Free Pct: 41.4 %
PSA, Free: 0.29 ng/mL
Prostate Specific Ag, Serum: 0.7 ng/mL (ref 0.0–4.0)

## 2022-06-22 LAB — HEMOGLOBIN A1C
Est. average glucose Bld gHb Est-mCnc: 111 mg/dL
Hgb A1c MFr Bld: 5.5 % (ref 4.8–5.6)

## 2022-06-22 LAB — CARDIOVASCULAR RISK ASSESSMENT

## 2022-06-23 ENCOUNTER — Other Ambulatory Visit (HOSPITAL_COMMUNITY): Payer: Self-pay

## 2022-06-23 MED ORDER — HYDROCODONE-ACETAMINOPHEN 10-325 MG PO TABS
1.0000 | ORAL_TABLET | Freq: Three times a day (TID) | ORAL | 0 refills | Status: DC | PRN
  Filled 2022-06-23: qty 90, 30d supply, fill #0

## 2022-06-26 ENCOUNTER — Telehealth: Payer: Self-pay

## 2022-06-26 NOTE — Progress Notes (Signed)
Care Management & Coordination Services Pharmacy Team  Reason for Encounter: Appointment Reminder  Contacted patient to confirm telephone appointment with Arizona Constable, PharmD on 06/28/22 at 1:00pm.  Unsuccessful outreach. Left voicemail for patient to return call.   Chart review:  Recent office visits:  06/21/22 Neil Crouch FNP. Seen for routine visit. Referral to Chronic Care. Started on Albuterol Inhaler, Ferrous Sulfate 344m. D/C Insulin Deglud.   06/07/22 CRochel BromeMD. Orders Only. D/C Lisinopril 2675mand HCTZ. Started on Valsartan-HCTZ 160-12.75m4mRecent consult visits:  06/02/22 (Podiatry) [place], [place] for follow up. No med changes  Hospital visits:  None   Star Rating Drugs:  Medication:  Last Fill: Day Supply Atorvastatin   06/09/22-05/10/22 30ds Glimepiride   06/06/22-03/06/22 90ds Metformin   06/09/22-05/10/22 30ds Semaglutide (PAP)  Valsartan  06/14/22-06/08/22 30ds/8ds  Care Gaps: Annual wellness visit in last year? No, 06/16/20  If Diabetic: Last eye exam / retinopathy screening:10/28/21 Last diabetic foot exam:06/21/22   DanElray McgregorMAWaimanalo Beacharmacist Assistant  [phone]

## 2022-06-28 ENCOUNTER — Ambulatory Visit: Payer: PPO

## 2022-06-28 NOTE — Patient Outreach (Signed)
  Care Management   Follow Up Note   06/28/2022 Name: Andrew Wiggins MRN: [id number] DOB: Jul 02, 1955   Referred by: Neil Crouch, FNP Reason for referral : Care Coordination   An unsuccessful telephone outreach was attempted today. The patient was referred to the case management team for assistance with care management and care coordination.   Follow Up Plan: The patient has been provided with contact information for the care management team and has been advised to call with any health related questions or concerns.   Arizona Constable, Pharm.D. - [phone]

## 2022-06-29 ENCOUNTER — Other Ambulatory Visit: Payer: Self-pay | Admitting: Oncology

## 2022-06-29 ENCOUNTER — Other Ambulatory Visit (HOSPITAL_COMMUNITY): Payer: Self-pay

## 2022-06-29 DIAGNOSIS — M47816 Spondylosis without myelopathy or radiculopathy, lumbar region: Secondary | ICD-10-CM | POA: Diagnosis not present

## 2022-06-29 DIAGNOSIS — D500 Iron deficiency anemia secondary to blood loss (chronic): Secondary | ICD-10-CM

## 2022-06-29 DIAGNOSIS — M169 Osteoarthritis of hip, unspecified: Secondary | ICD-10-CM | POA: Diagnosis not present

## 2022-06-29 DIAGNOSIS — M5137 Other intervertebral disc degeneration, lumbosacral region: Secondary | ICD-10-CM | POA: Diagnosis not present

## 2022-06-29 DIAGNOSIS — Z79891 Long term (current) use of opiate analgesic: Secondary | ICD-10-CM | POA: Diagnosis not present

## 2022-06-29 DIAGNOSIS — M549 Dorsalgia, unspecified: Secondary | ICD-10-CM | POA: Diagnosis not present

## 2022-06-29 DIAGNOSIS — M25552 Pain in left hip: Secondary | ICD-10-CM | POA: Diagnosis not present

## 2022-06-29 DIAGNOSIS — M48061 Spinal stenosis, lumbar region without neurogenic claudication: Secondary | ICD-10-CM | POA: Diagnosis not present

## 2022-06-29 DIAGNOSIS — M47818 Spondylosis without myelopathy or radiculopathy, sacral and sacrococcygeal region: Secondary | ICD-10-CM | POA: Diagnosis not present

## 2022-06-29 DIAGNOSIS — G894 Chronic pain syndrome: Secondary | ICD-10-CM | POA: Diagnosis not present

## 2022-06-29 DIAGNOSIS — Z1389 Encounter for screening for other disorder: Secondary | ICD-10-CM | POA: Diagnosis not present

## 2022-06-29 DIAGNOSIS — M5136 Other intervertebral disc degeneration, lumbar region: Secondary | ICD-10-CM | POA: Diagnosis not present

## 2022-06-29 MED ORDER — HYDROCODONE-ACETAMINOPHEN 10-325 MG PO TABS
1.0000 | ORAL_TABLET | Freq: Three times a day (TID) | ORAL | 0 refills | Status: DC | PRN
  Filled 2022-07-24: qty 90, 30d supply, fill #0

## 2022-06-29 NOTE — Progress Notes (Signed)
[facility]  [address] [place],  [place]  [address] [phone]  Clinic Day:  06/30/2022  Referring physician: Neil Crouch, FNP   HISTORY OF PRESENT ILLNESS:  The patient is a 67 y.o. male  who I was asked to consult upon for iron deficiency anemia.  Recent labs showed a low hemoglobin of 10.3, with a borderline low MCV of 83.  Iron studies done recently showed a low ferritin of 25, a serum iron of 45, an elevated TIBC of 465, and a low iron saturation of 10%.  The patient claims he has been taking an iron pill daily for the past month.  He denies having any overt forms of blood loss.  Of note, this gentleman has never undergone a previous colonoscopy.  To his knowledge, there is no family history of anemia or other blood disorders.  Overall, he denies there being any particular changes in his health over these past months.  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 ~  Jan11:  Last seen by Cards & pacer reprogrammed...     Cardiac pacemaker in situ    Carotid artery disease ([place]) 07/27/2010   R/O PERIPHERAL VASCULAR DISEASE (ICD-443.9) - on ASA '81mg'$ /d... exam 11/09 shows gr 1-2 sys murmur LSB, but also has prominent bruit to base of right side of his neck w/ right > left Carotid Bruit... ~  CDopplers 11/09 showed severe plaque right ICA, & min plaque on left... no signif ICA stenoses per report. 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 smokes 1/2 - 1ppd x yrs... ~  PFT's 11/08 showed FVC= 3.85 (79%), FEV1= 2.19 (55%), %1sec= 57, mid-flows= 32% predicted:  all c/w mod airflow obstruction...  ~  CXR 12/10 showed left subclav pacer, mild hyperinflation, NAD...     Depression    Dermatitis, contact    due to poison ivy   Diabetes mellitus ([place]) 02/04/2020   Diabetes mellitus with neuropathy ([place]) 12/20/2016   Dyslipidemia 03/06/2012   Essential hypertension 04/10/2007    HYPERTENSION (ICD-401.9) - prev on Micardis '80mg'$ /d then switched to Losartan '100mg'$ /d, but he lost his job in 2011& couldn't afford Rx;  He called [place] 2/12 w/ this news & we phoned in Lisinopril '20mg'$ /d... ~  2DEcho 11/09 showed sl incr LVwall thickness, norm LVF w/ EF= 55-60%, no regional wall motion abn, mild DD, mild MR... ~  Myoview 12/09 showed LBBB, no perfusion defects, abn septal motion & EF   GERD 03/26/2008    GERD (ICD-530.81) - prev mild GERD symptoms that improved after cholecystectomy on 2000... uses OTC PPI as needed... ~  he is due for routine screening colonoscopy...       Mixed hyperlipidemia 06/19/2019   PVD (peripheral vascular disease) ([place])    Sciatica 11/18/2019   Tobacco use 02/04/2020   Tobacco use disorder  PAST SURGICAL HISTORY:   Past Surgical History:  Procedure Laterality Date   ABDOMINAL AORTOGRAM W/LOWER EXTREMITY N/A 12/14/2020   Procedure: ABDOMINAL AORTOGRAM W/LOWER EXTREMITY;  Surgeon: [name], MD;  Location: [facility];  Service: Cardiovascular;  Laterality: N/A;   ABDOMINAL AORTOGRAM W/LOWER EXTREMITY N/A 05/24/2021   Procedure: ABDOMINAL AORTOGRAM W/LOWER EXTREMITY;  Surgeon: [name], MD;  Location: [facility];  Service: Cardiovascular;  Laterality: N/A;   CATARACT EXTRACTION Bilateral    laproscopic cholecystectomy  05/08/1998   by Dr. Abran Cantor   MOUTH SURGERY     PACEMAKER INSERTION  03/08/2008   by Dr.  Lovena Le   PERIPHERAL VASCULAR INTERVENTION Left 12/14/2020   Procedure: PERIPHERAL VASCULAR INTERVENTION;  Surgeon: [name], MD;  Location: [facility];  Service: Cardiovascular;  Laterality: Left;  SFA   PERIPHERAL VASCULAR INTERVENTION Left 05/24/2021   Procedure: PERIPHERAL VASCULAR INTERVENTION;  Surgeon: [name], MD;  Location: [facility];  Service: Cardiovascular;  Laterality: Left;   PPM GENERATOR CHANGEOUT N/A 05/25/2021   Procedure: PPM GENERATOR CHANGEOUT;  Surgeon: [name], MD;  Location: [facility];  Service: Cardiovascular;  Laterality: N/A;    CURRENT MEDICATIONS:   Current Outpatient Medications  Medication Sig Dispense Refill   albuterol (VENTOLIN HFA) 108 (90 Base) MCG/ACT inhaler Inhale 2 puffs into the lungs every 6 (six) hours as needed for wheezing or shortness of breath. 8 g 2   atorvastatin (LIPITOR) 20 MG tablet TAKE ONE TABLET BY MOUTH EVERY EVENING 90 tablet 2   clopidogrel (PLAVIX) 75 MG tablet TAKE ONE TABLET BY MOUTH EVERY MORNING 90 tablet 2   gabapentin (NEURONTIN) 300 MG capsule TAKE TWO CAPSULES BY MOUTH THREE TIMES DAILY 270 capsule 1   glimepiride (AMARYL) 4 MG tablet Take 1 tablet (4 mg total) by mouth daily. 30 tablet 0   HYDROcodone-acetaminophen (NORCO) 10-325 MG tablet Take 1 tablet by mouth 3 (three) times daily as needed for back pain. 90 tablet 0   [START ON 07/22/2022] HYDROcodone-acetaminophen (NORCO) 10-325 MG tablet Take 1 tablet by mouth 3 (three) times daily as needed for back pain 90 tablet 0   Iron, Ferrous Sulfate, 325 (65 Fe) MG TABS Take 325 mg by mouth daily. 90 tablet 0   metFORMIN (GLUCOPHAGE) 1000 MG tablet Take 1 tablet (1,000 mg total) by mouth 2 (two) times daily with a meal. 60 tablet 0   Omega-3 Fatty Acids (FISH OIL) 1000 MG CAPS Take 2 capsules (2,000 mg total) by mouth 2 (two) times daily. 180 capsule 12   Semaglutide (RYBELSUS) 7 MG TABS Take 1 tablet (7 mg total) by mouth  daily. 90 tablet 1   valsartan-hydrochlorothiazide (DIOVAN-HCT) 160-12.5 MG tablet Take 1 tablet by mouth daily. 90 tablet 0   No current facility-administered medications for this visit.    ALLERGIES:  No Known Allergies  FAMILY HISTORY:   Family History  Problem Relation Age of Onset   Heart disease Brother    Leukemia Brother     SOCIAL HISTORY:  The patient was raised in [place].  [place].  He is separated, with 2 children and 1 grandchild.  He worked in [employer] for 34 years.  He has smoked a pack of cigarettes daily for the past 47 years.  There is no history of alcohol abuse.  REVIEW OF SYSTEMS:  Review of Systems  Constitutional:  Negative for fatigue, fever and unexpected  weight change.  Respiratory:  Negative for chest tightness, cough, hemoptysis and shortness of breath.   Cardiovascular:  Negative for chest pain and palpitations.  Gastrointestinal:  Negative for abdominal distention, abdominal pain, blood in stool, constipation, diarrhea, nausea and vomiting.  Genitourinary:  Negative for dysuria, frequency and hematuria.   Musculoskeletal:  Positive for arthralgias. Negative for back pain and myalgias.  Skin:  Negative for itching and rash.  Neurological:  Negative for dizziness, headaches and light-headedness.  Psychiatric/Behavioral:  Negative for depression and suicidal ideas. The patient is not nervous/anxious.     PHYSICAL EXAM:  Blood pressure (!) 178/78, pulse (!) 101, temperature 98.9 F (37.2 C), resp. rate 16, height '5\' 10"'$  (1.778 m), weight 146 lb 14.4 oz (66.6 kg), SpO2 96 %. Wt Readings from Last 3 Encounters:  06/30/22 146 lb 14.4 oz (66.6 kg)  06/21/22 146 lb (66.2 kg)  03/20/22 147 lb 9.6 oz (67 kg)   Body mass index is 21.08 kg/m. Performance status (ECOG): 0 - Asymptomatic Physical Exam Constitutional:      Appearance: Normal appearance. He is not ill-appearing.  HENT:     Mouth/Throat:     Mouth:  Mucous membranes are moist.     Pharynx: Oropharynx is clear. No oropharyngeal exudate or posterior oropharyngeal erythema.  Cardiovascular:     Rate and Rhythm: Normal rate and regular rhythm.     Heart sounds: No murmur heard.    No friction rub. No gallop.  Pulmonary:     Effort: Pulmonary effort is normal. No respiratory distress.     Breath sounds: Normal breath sounds. No wheezing, rhonchi or rales.  Abdominal:     General: Bowel sounds are normal. There is no distension.     Palpations: Abdomen is soft. There is no mass.     Tenderness: There is no abdominal tenderness.  Musculoskeletal:        General: No swelling.     Right lower leg: No edema.     Left lower leg: No edema.  Lymphadenopathy:     Cervical: No cervical adenopathy.     Upper Body:     Right upper body: No supraclavicular or axillary adenopathy.     Left upper body: No supraclavicular or axillary adenopathy.     Lower Body: No right inguinal adenopathy. No left inguinal adenopathy.  Skin:    General: Skin is warm.     Coloration: Skin is not jaundiced.     Findings: No lesion or rash.  Neurological:     General: No focal deficit present.     Mental Status: He is alert and oriented to person, place, and time. Mental status is at baseline.  Psychiatric:        Mood and Affect: Mood normal.        Behavior: Behavior normal.        Thought Content: Thought content normal.     LABS:      Latest Ref Rng & Units 06/30/2022   12:00 AM 06/21/2022    9:16 AM 12/23/2021    9:28 AM  CBC  WBC  9.6     10.0  10.6   Hemoglobin 13.5 - 17.5 10.9     10.3  11.0   Hematocrit 41 - 53 35     36.3  34.6   Platelets 150 - 400 K/uL 309     331  278      This result is from an external source.  Latest Ref Rng & Units 06/21/2022    9:16 AM 12/13/2021    8:28 AM 06/17/2021    8:37 AM  CMP  Glucose 70 - 99 mg/dL 119  218  177   BUN 8 - 27 mg/dL '14  10  10   '$ Creatinine 0.76 - 1.27 mg/dL 0.92  0.93  0.90   Sodium 134  - 144 mmol/L 144  139  140   Potassium 3.5 - 5.2 mmol/L 5.2  4.0  4.5   Chloride 96 - 106 mmol/L 104  100  102   CO2 20 - 29 mmol/L '22  24  24   '$ Calcium 8.6 - 10.2 mg/dL 9.6  10.1  9.5   Total Protein 6.0 - 8.5 g/dL 7.0  6.8  6.8   Total Bilirubin 0.0 - 1.2 mg/dL <0.2  <0.2  0.3   Alkaline Phos 44 - 121 IU/L 116  121  117   AST 0 - 40 IU/L '16  15  17   '$ ALT 0 - 44 IU/L '10  14  11     '$ Latest Reference Range & Units 06/21/22 09:19  Iron 38 - 169 ug/dL 45  UIBC 111 - 343 ug/dL 420 (H)  TIBC 250 - 450 ug/dL 465 (H)  Ferritin 30 - 400 ng/mL 25 (L)  Iron Saturation 15 - 55 % 10 (L)  (H): Data is abnormally high (L): Data is abnormally low  ASSESSMENT & PLAN:  A 67 y.o. male who I was asked to consult upon for iron deficiency anemia.  I will arrange for him to receive IV iron over these next few weeks to rapidly replenish his iron stores and normalize his hemoglobin.  As this gentleman has never undergone a colonoscopy, I will arrange for him to be seen by GI to have this procedure done as quickly as possible.  Otherwise, I will see him back in 3 months to reassess his iron and hemoglobin levels to see how well he responded to his upcoming IV iron.  The patient understands all the plans discussed today and is in agreement with them.  I do appreciate Neil Crouch, FNP for his new consult.   Sameen Leas Macarthur Critchley, MD

## 2022-06-30 ENCOUNTER — Inpatient Hospital Stay: Payer: PPO | Attending: Oncology | Admitting: Oncology

## 2022-06-30 ENCOUNTER — Inpatient Hospital Stay: Payer: PPO

## 2022-06-30 ENCOUNTER — Encounter: Payer: Self-pay | Admitting: Oncology

## 2022-06-30 ENCOUNTER — Telehealth: Payer: Self-pay | Admitting: Oncology

## 2022-06-30 VITALS — BP 178/78 | HR 101 | Temp 98.9°F | Resp 16 | Ht 70.0 in | Wt 146.9 lb

## 2022-06-30 DIAGNOSIS — I10 Essential (primary) hypertension: Secondary | ICD-10-CM | POA: Diagnosis not present

## 2022-06-30 DIAGNOSIS — D649 Anemia, unspecified: Secondary | ICD-10-CM | POA: Diagnosis not present

## 2022-06-30 DIAGNOSIS — D500 Iron deficiency anemia secondary to blood loss (chronic): Secondary | ICD-10-CM | POA: Diagnosis not present

## 2022-06-30 DIAGNOSIS — D509 Iron deficiency anemia, unspecified: Secondary | ICD-10-CM | POA: Insufficient documentation

## 2022-06-30 DIAGNOSIS — Z806 Family history of leukemia: Secondary | ICD-10-CM | POA: Insufficient documentation

## 2022-06-30 DIAGNOSIS — F17210 Nicotine dependence, cigarettes, uncomplicated: Secondary | ICD-10-CM | POA: Diagnosis not present

## 2022-06-30 DIAGNOSIS — E1140 Type 2 diabetes mellitus with diabetic neuropathy, unspecified: Secondary | ICD-10-CM | POA: Insufficient documentation

## 2022-06-30 LAB — CBC AND DIFFERENTIAL
HCT: 35 — AB (ref 41–53)
Hemoglobin: 10.9 — AB (ref 13.5–17.5)
Neutrophils Absolute: 6.62
Platelets: 309 10*3/uL (ref 150–400)
WBC: 9.6

## 2022-06-30 LAB — VITAMIN B12: Vitamin B-12: 226 pg/mL (ref 180–914)

## 2022-06-30 LAB — FOLATE: Folate: 12.2 ng/mL (ref >5.9)

## 2022-06-30 LAB — CBC: RBC: 4.25 (ref 3.87–5.11)

## 2022-06-30 NOTE — Progress Notes (Signed)
Pt states, " I get depressed sometimes as I like to go out in the woods and walk around and I can't due to arthritic pain in hips."  No medication for depression.  Does not fell he need it.

## 2022-06-30 NOTE — Telephone Encounter (Signed)
Patient has been scheduled for follow-up visit per 06/30/22 LOS.  Pt given an appt calendar with date and time.

## 2022-07-02 ENCOUNTER — Encounter: Payer: Self-pay | Admitting: Oncology

## 2022-07-03 ENCOUNTER — Other Ambulatory Visit: Payer: Self-pay

## 2022-07-03 ENCOUNTER — Telehealth: Payer: Self-pay

## 2022-07-03 DIAGNOSIS — E0840 Diabetes mellitus due to underlying condition with diabetic neuropathy, unspecified: Secondary | ICD-10-CM

## 2022-07-03 MED ORDER — GABAPENTIN 300 MG PO CAPS: 600.0000 mg | ORAL_CAPSULE | Freq: Three times a day (TID) | ORAL | 2 refills | Status: DC

## 2022-07-03 NOTE — Progress Notes (Signed)
Care Management & Coordination Services Pharmacy Team  Reason for Encounter: Medication coordination and delivery  Contacted patient to discuss medications and coordinate delivery from [facility].  Spoke with patient on 07/03/2022   Cycle dispensing form sent to [facility] for review.   Last adherence delivery date:05/15/22      Patient is due for next adherence delivery on: 07/13/22  This delivery to include: Adherence Packaging  30 Days  Aspirin '81mg'$  1 at EM Atorvastatin '20mg'$  1 at EM Metformin '1000mg'$  1 at B 1 at EM Clopidogrel '75mg'$  1 at B Gabapentin '300mg'$  2 at B, 2 at L, 2 at EM Valsartan-HCTZ 160-12.5 1 B   Patient declined the following medications this month: Glimepiride '4mg'$ -LF 06/06/22 90ds Omega 3-OTC Iron '325mg'$ -LF 06/21/22 90ds Albuterol Inhaler-LF 06/21/22 25ds Rybelsus '7mg'$ - Getting PAP. On last bottle. Pt never did submit his 2024 re-enrollment application. I advised him to pick that up from Dr. Tobie Poet office asap to continue processing.  Hydrocodone 10-'325mg'$ - Picked up at 06/23/22 30ds at [facility] requested from providers include: Gabapentin '300mg'$    Confirmed delivery date of 07/13/22, advised patient that pharmacy will contact them the morning of delivery.  Any concerns about your medications? No  How often do you forget or accidentally miss a dose? Never  Do you use a pillbox? No  Is patient in packaging Yes   Recent blood pressure readings are as follows:06/30/22 178/78, 06/21/22 130/70  Recent blood glucose readings are as follows: Pt didn't have any readings, he is supposed to update me with a reading tomorrow morning   Chart review: Recent office visits:  None  Recent consult visits:  06/30/22 (Oncology) Lavera Guise MD. Seen for Iron Deficiency. No med changes.   Hospital visits:  None  Medications: Outpatient Encounter Medications as of 07/03/2022  Medication Sig   albuterol (VENTOLIN HFA) 108 (90 Base) MCG/ACT inhaler Inhale 2  puffs into the lungs every 6 (six) hours as needed for wheezing or shortness of breath.   atorvastatin (LIPITOR) 20 MG tablet TAKE ONE TABLET BY MOUTH EVERY EVENING   clopidogrel (PLAVIX) 75 MG tablet TAKE ONE TABLET BY MOUTH EVERY MORNING   gabapentin (NEURONTIN) 300 MG capsule TAKE TWO CAPSULES BY MOUTH THREE TIMES DAILY   glimepiride (AMARYL) 4 MG tablet Take 1 tablet (4 mg total) by mouth daily.   HYDROcodone-acetaminophen (NORCO) 10-325 MG tablet Take 1 tablet by mouth 3 (three) times daily as needed for back pain.   [START ON 07/22/2022] HYDROcodone-acetaminophen (NORCO) 10-325 MG tablet Take 1 tablet by mouth 3 (three) times daily as needed for back pain   Iron, Ferrous Sulfate, 325 (65 Fe) MG TABS Take 325 mg by mouth daily.   metFORMIN (GLUCOPHAGE) 1000 MG tablet Take 1 tablet (1,000 mg total) by mouth 2 (two) times daily with a meal.   Omega-3 Fatty Acids (FISH OIL) 1000 MG CAPS Take 2 capsules (2,000 mg total) by mouth 2 (two) times daily.   Semaglutide (RYBELSUS) 7 MG TABS Take 1 tablet (7 mg total) by mouth daily.   valsartan-hydrochlorothiazide (DIOVAN-HCT) 160-12.5 MG tablet Take 1 tablet by mouth daily.   No facility-administered encounter medications on file as of 07/03/2022.   BP Readings from Last 3 Encounters:  06/30/22 (!) 178/78  06/21/22 130/70  03/20/22 (!) 167/74    Pulse Readings from Last 3 Encounters:  06/30/22 (!) 101  06/21/22 88  03/20/22 (!) 105    Lab Results  Component Value Date/Time   HGBA1C 5.5 06/21/2022  09:16 AM   HGBA1C 8.6 (H) 12/23/2021 09:28 AM   Lab Results  Component Value Date   CREATININE 0.92 06/21/2022   BUN 14 06/21/2022   GFR 83.08 12/20/2017   GFRNONAA 90 11/18/2019   GFRAA 104 11/18/2019   NA 144 06/21/2022   K 5.2 06/21/2022   CALCIUM 9.6 06/21/2022   CO2 22 06/21/2022     Elray Mcgregor, [facility] Clinical Pharmacist Assistant  [phone]

## 2022-07-04 NOTE — Telephone Encounter (Cosign Needed)
Pt called me back today and rechecked his sugar and it was 82 this morning.  Elray Mcgregor, [facility] Pharmacist Assistant  [phone]

## 2022-07-05 NOTE — Telephone Encounter (Signed)
Added ASA to medslist

## 2022-07-06 ENCOUNTER — Telehealth: Payer: Self-pay

## 2022-07-06 ENCOUNTER — Other Ambulatory Visit: Payer: Self-pay | Admitting: Pharmacist

## 2022-07-06 MED FILL — Ferumoxytol Inj 510 MG/17ML (30 MG/ML) (Elemental Fe): INTRAVENOUS | Qty: 17 | Status: AC

## 2022-07-06 NOTE — Telephone Encounter (Signed)
Called patient to schedule Medicare Annual Wellness Visit (AWV). Left message for patient to call back and schedule Medicare Annual Wellness Visit (AWV).  Last date of AWV: 06/16/20  Please schedule an appointment at any time with Maudie Mercury or Lemmie Evens.   Thank you ,  Norton Blizzard, [place] [facility]  [facility] [phone]

## 2022-07-07 ENCOUNTER — Inpatient Hospital Stay: Payer: PPO | Attending: Oncology

## 2022-07-07 VITALS — BP 176/77 | HR 93 | Temp 98.0°F | Resp 18 | Wt 145.0 lb

## 2022-07-07 DIAGNOSIS — D500 Iron deficiency anemia secondary to blood loss (chronic): Secondary | ICD-10-CM

## 2022-07-07 DIAGNOSIS — D509 Iron deficiency anemia, unspecified: Secondary | ICD-10-CM | POA: Insufficient documentation

## 2022-07-07 DIAGNOSIS — Z79899 Other long term (current) drug therapy: Secondary | ICD-10-CM | POA: Insufficient documentation

## 2022-07-07 MED ORDER — SODIUM CHLORIDE 0.9 % IV SOLN
510.0000 mg | Freq: Once | INTRAVENOUS | Status: AC
  Administered 2022-07-07: 510 mg via INTRAVENOUS
  Filled 2022-07-07: qty 510

## 2022-07-07 MED ORDER — SODIUM CHLORIDE 0.9 % IV SOLN: Freq: Once | INTRAVENOUS | Status: AC

## 2022-07-13 MED FILL — Ferumoxytol Inj 510 MG/17ML (30 MG/ML) (Elemental Fe): INTRAVENOUS | Qty: 17 | Status: AC

## 2022-07-13 NOTE — Progress Notes (Signed)
  Chronic Care Management   Note  07/13/2022 Name: Andrew Wiggins MRN: [id number] DOB: [birth year]  Andrew Wiggins is a 67 y.o. year old male who is a primary care patient of Neil Crouch, [place]. I reached out to [employer] by phone today in response to a referral sent by [name]'s PCP.  The second contact attempt was unsuccessful.   Follow up plan: Additional outreach attempts will be made.  Noreene Larsson, [place], [place] [address] Direct Dial: [phone] [email or web address]

## 2022-07-14 ENCOUNTER — Inpatient Hospital Stay: Payer: PPO

## 2022-07-14 VITALS — BP 138/74 | HR 91 | Temp 98.7°F | Resp 18 | Ht 70.0 in | Wt 147.1 lb

## 2022-07-14 DIAGNOSIS — D509 Iron deficiency anemia, unspecified: Secondary | ICD-10-CM | POA: Diagnosis not present

## 2022-07-14 DIAGNOSIS — D500 Iron deficiency anemia secondary to blood loss (chronic): Secondary | ICD-10-CM

## 2022-07-14 MED ORDER — SODIUM CHLORIDE 0.9 % IV SOLN
510.0000 mg | Freq: Once | INTRAVENOUS | Status: AC
  Administered 2022-07-14: 510 mg via INTRAVENOUS
  Filled 2022-07-14: qty 510

## 2022-07-14 MED ORDER — SODIUM CHLORIDE 0.9 % IV SOLN: Freq: Once | INTRAVENOUS | Status: AC

## 2022-07-14 NOTE — Patient Instructions (Signed)

## 2022-07-19 NOTE — Progress Notes (Signed)
  Chronic Care Management   Note  07/19/2022 Name: AYAZ SONDGEROTH MRN: [id number] DOB: Oct 01, 1955  LEONTAE BOSTOCK is a 67 y.o. year old male who is a primary care patient of Neil Crouch, [place]. I reached out to [employer] by phone today in response to a referral sent by Mr. Nasim Habeeb Ruppe's PCP.  Mr. Marrow was given information about Chronic Care Management services today including:  CCM service includes personalized support from designated clinical staff supervised by the physician, including individualized plan of care and coordination with other care providers 24/7 contact phone numbers for assistance for urgent and routine care needs. Service will only be billed when office clinical staff spend 20 minutes or more in a month to coordinate care. Only one practitioner may furnish and bill the service in a calendar month. The patient may stop CCM services at amy time (effective at the end of the month) by phone call to the office staff. The patient will be responsible for cost sharing (co-pay) or up to 20% of the service fee (after annual deductible is met)  Mr. MIRAN KAUTZMAN  agreedto scheduling an appointment with the CCM RN Case Manager   Follow up plan: Patient agreed to scheduled appointment with RN Case Manager on 07/21/2022(date/time).   Noreene Larsson, [place], [place] [address] Direct Dial: [phone] [email or web address]

## 2022-07-21 ENCOUNTER — Telehealth: Payer: Self-pay

## 2022-07-21 ENCOUNTER — Telehealth: Payer: PPO

## 2022-07-21 NOTE — Telephone Encounter (Signed)
   CCM RN Visit Note   07-21-2022 Name: [name] MRN: [id number]      DOB: [birth year]  Subjective: Andrew Wiggins is a 67 y.o. year old male who is a primary care patient of @PCP . The patient was referred to the Chronic Care Management team for assistance with care management needs subsequent to provider initiation of CCM services and plan of care.      An unsuccessful telephone outreach was attempted today to contact the patient about Chronic Care Management needs.    Plan:A HIPAA compliant phone message was left for the patient providing contact information and requesting a return call.  Noreene Larsson RN, MSN, CCM RN Care Manager  Chronic Care Management Direct Number: [phone]

## 2022-07-22 ENCOUNTER — Other Ambulatory Visit (HOSPITAL_BASED_OUTPATIENT_CLINIC_OR_DEPARTMENT_OTHER): Payer: Self-pay

## 2022-07-24 ENCOUNTER — Other Ambulatory Visit (HOSPITAL_COMMUNITY): Payer: Self-pay

## 2022-07-24 ENCOUNTER — Telehealth: Payer: Self-pay

## 2022-07-24 NOTE — Progress Notes (Signed)
  Chronic Care Management Note  07/24/2022 Name: Andrew Wiggins MRN: [id number] DOB: 1955-06-08  Andrew Wiggins is a 67 y.o. year old male who is a primary care patient of [place], Palma Holter, [place] and is actively engaged with the Chronic Care Management team. I reached out to Andrew Wiggins by phone today to assist with re-scheduling an initial visit with the RN Case Manager  Follow up plan: Unsuccessful telephone outreach attempt made. A HIPAA compliant phone message was left for the patient providing contact information and requesting a return call.  The care management team will reach out to the patient again over the next 7 days.  If patient returns call to provider office, please advise to call [facility]  at [place], [place], [place] [address] Direct Dial: [phone] [name]

## 2022-07-26 ENCOUNTER — Other Ambulatory Visit (HOSPITAL_COMMUNITY): Payer: Self-pay

## 2022-07-26 ENCOUNTER — Telehealth: Payer: Self-pay

## 2022-07-26 DIAGNOSIS — M5137 Other intervertebral disc degeneration, lumbosacral region: Secondary | ICD-10-CM | POA: Diagnosis not present

## 2022-07-26 DIAGNOSIS — G894 Chronic pain syndrome: Secondary | ICD-10-CM | POA: Diagnosis not present

## 2022-07-26 DIAGNOSIS — M25552 Pain in left hip: Secondary | ICD-10-CM | POA: Diagnosis not present

## 2022-07-26 DIAGNOSIS — M169 Osteoarthritis of hip, unspecified: Secondary | ICD-10-CM | POA: Diagnosis not present

## 2022-07-26 DIAGNOSIS — M47818 Spondylosis without myelopathy or radiculopathy, sacral and sacrococcygeal region: Secondary | ICD-10-CM | POA: Diagnosis not present

## 2022-07-26 DIAGNOSIS — M48061 Spinal stenosis, lumbar region without neurogenic claudication: Secondary | ICD-10-CM | POA: Diagnosis not present

## 2022-07-26 DIAGNOSIS — M5136 Other intervertebral disc degeneration, lumbar region: Secondary | ICD-10-CM | POA: Diagnosis not present

## 2022-07-26 DIAGNOSIS — Z1389 Encounter for screening for other disorder: Secondary | ICD-10-CM | POA: Diagnosis not present

## 2022-07-26 DIAGNOSIS — M549 Dorsalgia, unspecified: Secondary | ICD-10-CM | POA: Diagnosis not present

## 2022-07-26 DIAGNOSIS — M47816 Spondylosis without myelopathy or radiculopathy, lumbar region: Secondary | ICD-10-CM | POA: Diagnosis not present

## 2022-07-26 DIAGNOSIS — Z79891 Long term (current) use of opiate analgesic: Secondary | ICD-10-CM | POA: Diagnosis not present

## 2022-07-26 MED ORDER — HYDROCODONE-ACETAMINOPHEN 10-325 MG PO TABS
1.0000 | ORAL_TABLET | Freq: Three times a day (TID) | ORAL | 0 refills | Status: DC | PRN
  Filled 2022-07-26 – 2022-08-22 (×2): qty 90, 30d supply, fill #0

## 2022-07-26 NOTE — Telephone Encounter (Signed)
Patient's spouse Rise Paganini) called into office requesting a sooner appt for worsening LLE Pains. Wife states pt started experiencing LLE Pains after his blood transfusion on 07/14/22. She states he is having worsening numbness, swelling, redness and tenderness. She stated he is also having some yellowish/brownish drainage from the partial amputated toe site. I attempted to reach pt to discuss his pain levels and medications. He didn't answer so I reached his wife and advised of appt times and asked if he is currently taking his Plavix. His wife voiced that he still takes the plavix, asa, and atorvastatin.Appt scheduled. She voiced her understanding.

## 2022-07-27 ENCOUNTER — Ambulatory Visit: Payer: PPO | Admitting: Physician Assistant

## 2022-07-27 ENCOUNTER — Ambulatory Visit (INDEPENDENT_AMBULATORY_CARE_PROVIDER_SITE_OTHER)
Admission: RE | Admit: 2022-07-27 | Discharge: 2022-07-27 | Disposition: A | Discharge status: Home / Self Care | Payer: PPO | Source: Ambulatory Visit | Attending: Vascular Surgery | Admitting: Vascular Surgery

## 2022-07-27 ENCOUNTER — Other Ambulatory Visit: Payer: Self-pay

## 2022-07-27 ENCOUNTER — Ambulatory Visit (HOSPITAL_COMMUNITY)
Admission: RE | Admit: 2022-07-27 | Discharge: 2022-07-27 | Disposition: A | Discharge status: Home / Self Care | Payer: PPO | Source: Ambulatory Visit | Attending: Vascular Surgery | Admitting: Vascular Surgery

## 2022-07-27 VITALS — BP 190/70 | HR 64 | Temp 98.7°F | Resp 20 | Ht 70.0 in | Wt 146.5 lb

## 2022-07-27 DIAGNOSIS — I739 Peripheral vascular disease, unspecified: Secondary | ICD-10-CM

## 2022-07-27 DIAGNOSIS — I70222 Atherosclerosis of native arteries of extremities with rest pain, left leg: Secondary | ICD-10-CM | POA: Diagnosis not present

## 2022-07-27 LAB — VAS US ABI WITH/WO TBI
Left ABI: 0.39
Right ABI: 0.82

## 2022-07-27 MED ORDER — DOXYCYCLINE HYCLATE 100 MG PO CAPS: 100.0000 mg | ORAL_CAPSULE | Freq: Two times a day (BID) | ORAL | 0 refills | Status: DC

## 2022-07-27 NOTE — H&P (View-Only) (Signed)
HISTORY AND PHYSICAL     CC:  follow up. Requesting Provider:  Neil Crouch, FNP  HPI: This is a 67 y.o. male who is here today for follow up for PAD.  Pt has hx of left SFA stenting for toe ulcer in January 2023 and left SFA drug coated balloon angioplasty for rest pain by Dr. Trula Slade.   Pt was last seen 03/20/2022 and at that time, he was doing well.  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  He was not having any claudication.    The pt returns today for follow up.  He started having pain in the left foot over the past 3-4 days.  He states he has a new wound between the 4th and 5th toes.  He states   The pt is on a statin for cholesterol management.    The pt is on an aspirin.    Other AC:  Plavix The pt is on ARB, diuretic for hypertension.  The pt does  have diabetes. 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 ~  Jan11:  Last seen by Cards & pacer reprogrammed...     Cardiac pacemaker in situ    Carotid artery disease ([place]) 07/27/2010   R/O PERIPHERAL VASCULAR DISEASE (ICD-443.9) - on ASA 81mg /d... exam 11/09 shows gr 1-2 sys murmur LSB, but also has prominent bruit to base of right side of his neck w/ right > left Carotid Bruit... ~  CDopplers 11/09 showed severe plaque right ICA, & min plaque on left... no signif ICA stenoses per report. 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  smokes 1/2 - 1ppd x yrs... ~  PFT's 11/08 showed FVC= 3.85 (79%), FEV1= 2.19 (55%), %1sec= 57, mid-flows= 32% predicted:  all c/w mod airflow obstruction...  ~  CXR 12/10 showed left subclav pacer, mild hyperinflation, NAD...     Depression    Dermatitis, contact    due to poison ivy   Diabetes mellitus ([place]) 02/04/2020   Diabetes mellitus with neuropathy ([place]) 12/20/2016   Dyslipidemia 03/06/2012   Essential hypertension 04/10/2007    HYPERTENSION (ICD-401.9) - prev on Micardis 80mg /d then switched to Losartan 100mg /d, but he lost his job in 2011& couldn't afford Rx;  He called [place] 2/12 w/ this news & we phoned in Lisinopril 20mg /d... ~  2DEcho 11/09 showed sl incr LVwall thickness, norm LVF w/ EF= 55-60%, no regional wall motion abn, mild DD, mild MR... ~  Myoview 12/09 showed LBBB, no perfusion defects, abn septal motion & EF   GERD 03/26/2008    GERD (ICD-530.81) - prev mild GERD symptoms that improved after cholecystectomy on 2000... uses OTC PPI as needed... ~  he is due for routine screening colonoscopy...       Mixed hyperlipidemia 06/19/2019   PVD (peripheral vascular disease) ([place])    Sciatica 11/18/2019  Tobacco use 02/04/2020   Tobacco use disorder     Past Surgical History:  Procedure Laterality Date   ABDOMINAL AORTOGRAM W/LOWER EXTREMITY N/A 12/14/2020   Procedure: ABDOMINAL AORTOGRAM W/LOWER EXTREMITY;  Surgeon: [name], MD;  Location: [facility];  Service: Cardiovascular;  Laterality: N/A;   ABDOMINAL AORTOGRAM W/LOWER EXTREMITY N/A 05/24/2021   Procedure: ABDOMINAL AORTOGRAM W/LOWER EXTREMITY;  Surgeon: [name], MD;  Location: [facility];  Service: Cardiovascular;  Laterality: N/A;   CATARACT EXTRACTION Bilateral    laproscopic cholecystectomy  05/08/1998   by Dr. Abran Cantor   MOUTH SURGERY     PACEMAKER INSERTION  03/08/2008   by Dr. Lovena Le   PERIPHERAL VASCULAR INTERVENTION Left 12/14/2020   Procedure: PERIPHERAL VASCULAR  INTERVENTION;  Surgeon: [name], MD;  Location: [facility];  Service: Cardiovascular;  Laterality: Left;  SFA   PERIPHERAL VASCULAR INTERVENTION Left 05/24/2021   Procedure: PERIPHERAL VASCULAR INTERVENTION;  Surgeon: [name], MD;  Location: [facility];  Service: Cardiovascular;  Laterality: Left;   PPM GENERATOR CHANGEOUT N/A 05/25/2021   Procedure: PPM GENERATOR CHANGEOUT;  Surgeon: [name], MD;  Location: [facility];  Service: Cardiovascular;  Laterality: N/A;    No Known Allergies  Current Outpatient Medications  Medication Sig Dispense Refill   albuterol (VENTOLIN HFA) 108 (90 Base) MCG/ACT inhaler Inhale 2 puffs into the lungs every 6 (six) hours as needed for wheezing or shortness of breath. 8 g 2   aspirin EC 81 MG tablet Take 81 mg by mouth daily. Swallow whole.     atorvastatin (LIPITOR) 20 MG tablet TAKE ONE TABLET BY MOUTH EVERY EVENING 90 tablet 2   clopidogrel (PLAVIX) 75 MG tablet TAKE ONE TABLET BY MOUTH EVERY MORNING 90 tablet 2   gabapentin (NEURONTIN) 300 MG capsule Take 2 capsules (600 mg total) by mouth 3 (three) times daily. 90 capsule 2   glimepiride (AMARYL) 4 MG tablet Take 1 tablet (4 mg total) by mouth daily. 30 tablet 0   HYDROcodone-acetaminophen (NORCO) 10-325 MG tablet Take 1 tablet by mouth 3 (three) times daily as needed for back pain. 90 tablet 0   HYDROcodone-acetaminophen (NORCO) 10-325 MG tablet Take 1 tablet by mouth 3 (three) times daily as needed for back pain. 90 tablet 0   Iron, Ferrous Sulfate, 325 (65 Fe) MG TABS Take 325 mg by mouth daily. 90 tablet 0   metFORMIN (GLUCOPHAGE) 1000 MG tablet Take 1 tablet (1,000 mg total) by mouth 2 (two) times daily with a meal. 60 tablet 0   Omega-3 Fatty Acids (FISH OIL) 1000 MG CAPS Take 2 capsules (2,000 mg total) by mouth 2 (two) times daily. 180 capsule 12   Semaglutide (RYBELSUS) 7 MG TABS Take 1 tablet (7 mg total) by mouth daily. 90 tablet 1    valsartan-hydrochlorothiazide (DIOVAN-HCT) 160-12.5 MG tablet Take 1 tablet by mouth daily. 90 tablet 0   No current facility-administered medications for this visit.    Family History  Problem Relation Age of Onset   Heart disease Brother    Leukemia Brother     Social History   Socioeconomic History   Marital status: Legally Separated    Spouse name: Not on file   Number of children: 2   Years of education: 11   Highest education level: Not on file  Occupational History   Occupation: unemployed    Comment: works for himself   Occupation: [employer]  Tobacco Use   Smoking status: Every Day    Packs/day: 0.50    Years: 25.00    Additional pack years: 0.00    Total pack years: 12.50    Types: Cigarettes    Passive exposure: Never   Smokeless tobacco: Never  Vaping Use   Vaping Use: Never used  Substance and Sexual Activity   Alcohol use: Not Currently    Alcohol/week: 0.0 standard drinks of alcohol   Drug use: No   Sexual activity: Not Currently  Other Topics Concern   Not on file  Social History Narrative   PT HAS VERY VAGUE FAMILY HISTORY - HE WAS RAISED IN [facility]   Social Determinants of Health   Financial Resource Strain: Low Risk  (12/21/2021)   Overall Financial Resource Strain (CARDIA)    Difficulty of Paying Living Expenses: Not hard at all  Food Insecurity: No Food Insecurity (06/30/2022)   Hunger Vital Sign    Worried About Running Out of Food in the Last Year: Never true    Ran Out of Food in the Last Year: Never true  Transportation Needs: No Transportation Needs (06/30/2022)   PRAPARE - Hydrologist (Medical): No    Lack of Transportation (Non-Medical): No  Physical Activity: Inactive (06/16/2020)   Exercise Vital Sign    Days of Exercise per Week: 0 days    Minutes of Exercise per Session: 30 min  Stress: Stress Concern Present (06/16/2020)   [place]    Feeling of Stress : To some extent  Social Connections: Socially Isolated (06/16/2020)   Social Connection and Isolation Panel [NHANES]    Frequency of Communication with Friends and Family: Three times a week    Frequency of Social Gatherings with Friends and Family: Never    Attends Religious Services: Never    Marine scientist or Organizations: No    Attends Archivist Meetings: Never    Marital Status: Divorced  Human resources officer Violence: Not At Risk (06/30/2022)   Humiliation, Afraid, Rape, and Kick questionnaire    Fear of Current or Ex-Partner: No    Emotionally Abused: No    Physically Abused: No    Sexually Abused: No     REVIEW OF SYSTEMS:   [X]  denotes positive finding, [ ]  denotes negative finding Cardiac  Comments:  Chest pain or chest pressure:    Shortness of breath upon exertion:    Short of breath when lying flat:    Irregular heart rhythm:        Vascular    Pain in calf, thigh, or hip brought on by ambulation:    Pain in feet at night that wakes you up from your sleep:  x   Blood clot in your veins:    Leg swelling:  x Left foot      Pulmonary    Oxygen at home:    Productive cough:     Wheezing:         Neurologic    Sudden weakness in arms or legs:     Sudden numbness in arms or legs:     Sudden onset of difficulty speaking or slurred speech:    Temporary loss of vision in one eye:     Problems with dizziness:         Gastrointestinal    Blood in stool:     Vomited blood:  Genitourinary    Burning when urinating:     Blood in urine:        Psychiatric    Major depression:         Hematologic    Bleeding problems:    Problems with blood clotting too easily:        Skin    Rashes or ulcers:        Constitutional    Fever or chills:      PHYSICAL EXAMINATION:  Today's Vitals   07/27/22 0832  BP: (!) 190/70  Pulse: 64  Resp: 20  Temp: 98.7 F (37.1 C)  TempSrc: Temporal  SpO2:  97%  Weight: 146 lb 8 oz (66.5 kg)  Height: 5\' 10"  (1.778 m)  PainSc: 9    Body mass index is 21.02 kg/m.   General:  WDWN in NAD; vital signs documented above Gait: Not observed HENT: WNL, normocephalic Pulmonary: normal non-labored breathing , without wheezing Cardiac: regular HR, without carotid bruits Abdomen: soft, NT; aortic pulse is not palpable Skin: without rashes Vascular Exam/Pulses:  Right Left  Radial 2+ (normal) 2+ (normal)  Femoral 2+ (normal) 1+ (weak)  Popliteal Unable to palpate Unable to palpate  DP 2+ (normal) Unable to palpate  PT Unable to palpate Unable to palpate   Extremities: left foot with mild swelling and erythematous with new wound between the partially amputated 4th toe and 5th toe; no issues with right foot.  Musculoskeletal: no muscle wasting or atrophy  Neurologic: A&O X 3 Psychiatric:  The pt has Normal affect.   Non-Invasive Vascular Imaging:   ABI's/TBI's on 07/27/2022: Right:  0.82/0/63 - Great toe pressure: 112 Left:  0.39/na dampened monophasic waveforms  Arterial duplex on 07/27/2022: +----------+--------+-----+--------+--------+--------+  LEFT     PSV cm/sRatioStenosisWaveformComments  +----------+--------+-----+--------+--------+--------+  CFA Distal132                                    +----------+--------+-----+--------+--------+--------+  DFA      185                                    +----------+--------+-----+--------+--------+--------+  SFA Prox  112                                    +----------+--------+-----+--------+--------+--------+  SFA Mid   83                                     +----------+--------+-----+--------+--------+--------+  POP Prox  95                                     +----------+--------+-----+--------+--------+--------+  POP Distal54                                     +----------+--------+-----+--------+--------+--------+   Left Stent(s):   +---------------+--------+---------------+----------+--------+  SFA           PSV cm/sStenosis       Waveform  Comments  +---------------+--------+---------------+----------+--------+  Prox to Stent  83  monophasic          +---------------+--------+---------------+----------+--------+  Proximal Stent 457     50-99% stenosismonophasic          +---------------+--------+---------------+----------+--------+  Mid Stent      549     50-99% stenosismonophasic          +---------------+--------+---------------+----------+--------+  Distal Stent   165                    monophasic          +---------------+--------+---------------+----------+--------+  Distal to Stent55                     monophasic          +---------------+--------+---------------+----------+--------+    Summary:  Left: 50-99% stenosis involving the left distal SFA stent.  Stenosis appears to begin approximately 1.72 cm from the origin of the stent.    Previous ABI's/TBI's on 03/20/2022: Right:  0.76/.0.47 - Great toe pressure: 77 Left:  0.58/0.53 - Great toe pressure:  87  Previous arterial duplex on 03/20/2022: +----------+--------+-----+---------------+----------+--------+  LEFT     PSV cm/sRatioStenosis       Waveform  Comments  +----------+--------+-----+---------------+----------+--------+  CFA Mid   151          30-49% stenosismonophasic          +----------+--------+-----+---------------+----------+--------+  CFA Distal172          30-49% stenosismonophasic          +----------+--------+-----+---------------+----------+--------+  DFA      230          50-74% stenosismonophasic          +----------+--------+-----+---------------+----------+--------+  SFA Prox  236          50-74% stenosismonophasic          +----------+--------+-----+---------------+----------+--------+  SFA Mid   106                                              +----------+--------+-----+---------------+----------+--------+  SFA Distal                                      stent     +----------+--------+-----+---------------+----------+--------+  POP Prox  128                         monophasic          +----------+--------+-----+---------------+----------+--------+  POP Distal86                          monophasic          +----------+--------+-----+---------------+----------+--------+  ATA Distal41                          monophasic          +----------+--------+-----+---------------+----------+--------+  PTA Distal38                          monophasic          +----------+--------+-----+---------------+----------+--------+   Left Stent(s):  +---------------+--------+--------+----------+--------+  distal SFA     PSV cm/sStenosisWaveform  Comments  +---------------+--------+--------+----------+--------+  Prox to Stent  121  monophasic          +---------------+--------+--------+----------+--------+  Proximal Stent 97              monophasic          +---------------+--------+--------+----------+--------+  Mid Stent      92              monophasic          +---------------+--------+--------+----------+--------+  Distal Stent   91              monophasic          +---------------+--------+--------+----------+--------+  Distal to Stent76              monophasic          +---------------+--------+--------+----------+--------+   Summary:  Left: 30-49% stenosis noted in the common femoral artery.  50-74% stenosis noted in the deep femoral artery.  50-74% stenosis noted in the proximal superficial femoral artery.  Widely patent distal superficial femoral artery stent.     ASSESSMENT/PLAN:: 67 y.o. male here for follow up for PAD with hx of PAD and hx of left SFA stenting for toe ulcer in January 2023 and left SFA drug coated balloon angioplasty for  rest pain by Dr. Trula Slade.  Pt now has rest pain and wound b/w the 4th and 5th toes  Critical limb ischemia with rest pain and wound -pt comes in today with new rest pain and a wound between the partially auto amputated 4th toe and 5th toe.  His foot is erythematous and swollen.  On duplex today, he has  significantly elevated velocities in the LLE involving the stent.  I have sent a prescription for doxycycline to his pharmacy and we will set him up for angiogram of the LLE tomorrow.  Discussed with pt that if there is an intervention that can happen, he will do it at that time, but could need surgery pending results.  He expressed understanding.  Discussed with pt that he should not have any procedures on his foot until his blood flow is optimized.  -continue asa/statin/plavix  Current smoker -discussion about critical nature of smoking cessation and he is at risk of limb loss.  Also discussed he is at risk for heart attack and stroke.     Leontine Locket, [facility] [phone]  Clinic MD:   Scot Dock

## 2022-07-27 NOTE — Progress Notes (Signed)
HISTORY AND PHYSICAL     CC:  follow up. Requesting Provider:  Neil Crouch, FNP  HPI: This is a 67 y.o. male who is here today for follow up for PAD.  Pt has hx of left SFA stenting for toe ulcer in January 2023 and left SFA drug coated balloon angioplasty for rest pain by Dr. Trula Slade.   Pt was last seen 03/20/2022 and at that time, he was doing well.  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  He was not having any claudication.    The pt returns today for follow up.  He started having pain in the left foot over the past 3-4 days.  He states he has a new wound between the 4th and 5th toes.  He states   The pt is on a statin for cholesterol management.    The pt is on an aspirin.    Other AC:  Plavix The pt is on ARB, diuretic for hypertension.  The pt does  have diabetes. 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 ~  Jan11:  Last seen by Cards & pacer reprogrammed...     Cardiac pacemaker in situ    Carotid artery disease ([place]) 07/27/2010   R/O PERIPHERAL VASCULAR DISEASE (ICD-443.9) - on ASA 81mg /d... exam 11/09 shows gr 1-2 sys murmur LSB, but also has prominent bruit to base of right side of his neck w/ right > left Carotid Bruit... ~  CDopplers 11/09 showed severe plaque right ICA, & min plaque on left... no signif ICA stenoses per report. 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  smokes 1/2 - 1ppd x yrs... ~  PFT's 11/08 showed FVC= 3.85 (79%), FEV1= 2.19 (55%), %1sec= 57, mid-flows= 32% predicted:  all c/w mod airflow obstruction...  ~  CXR 12/10 showed left subclav pacer, mild hyperinflation, NAD...     Depression    Dermatitis, contact    due to poison ivy   Diabetes mellitus ([place]) 02/04/2020   Diabetes mellitus with neuropathy ([place]) 12/20/2016   Dyslipidemia 03/06/2012   Essential hypertension 04/10/2007    HYPERTENSION (ICD-401.9) - prev on Micardis 80mg /d then switched to Losartan 100mg /d, but he lost his job in 2011& couldn't afford Rx;  He called [place] 2/12 w/ this news & we phoned in Lisinopril 20mg /d... ~  2DEcho 11/09 showed sl incr LVwall thickness, norm LVF w/ EF= 55-60%, no regional wall motion abn, mild DD, mild MR... ~  Myoview 12/09 showed LBBB, no perfusion defects, abn septal motion & EF   GERD 03/26/2008    GERD (ICD-530.81) - prev mild GERD symptoms that improved after cholecystectomy on 2000... uses OTC PPI as needed... ~  he is due for routine screening colonoscopy...       Mixed hyperlipidemia 06/19/2019   PVD (peripheral vascular disease) ([place])    Sciatica 11/18/2019  Tobacco use 02/04/2020   Tobacco use disorder     Past Surgical History:  Procedure Laterality Date   ABDOMINAL AORTOGRAM W/LOWER EXTREMITY N/A 12/14/2020   Procedure: ABDOMINAL AORTOGRAM W/LOWER EXTREMITY;  Surgeon: [name], MD;  Location: [facility];  Service: Cardiovascular;  Laterality: N/A;   ABDOMINAL AORTOGRAM W/LOWER EXTREMITY N/A 05/24/2021   Procedure: ABDOMINAL AORTOGRAM W/LOWER EXTREMITY;  Surgeon: [name], MD;  Location: [facility];  Service: Cardiovascular;  Laterality: N/A;   CATARACT EXTRACTION Bilateral    laproscopic cholecystectomy  05/08/1998   by Dr. Abran Cantor   MOUTH SURGERY     PACEMAKER INSERTION  03/08/2008   by Dr. Lovena Le   PERIPHERAL VASCULAR INTERVENTION Left 12/14/2020   Procedure: PERIPHERAL VASCULAR  INTERVENTION;  Surgeon: [name], MD;  Location: [facility];  Service: Cardiovascular;  Laterality: Left;  SFA   PERIPHERAL VASCULAR INTERVENTION Left 05/24/2021   Procedure: PERIPHERAL VASCULAR INTERVENTION;  Surgeon: [name], MD;  Location: [facility];  Service: Cardiovascular;  Laterality: Left;   PPM GENERATOR CHANGEOUT N/A 05/25/2021   Procedure: PPM GENERATOR CHANGEOUT;  Surgeon: [name], MD;  Location: [facility];  Service: Cardiovascular;  Laterality: N/A;    No Known Allergies  Current Outpatient Medications  Medication Sig Dispense Refill   albuterol (VENTOLIN HFA) 108 (90 Base) MCG/ACT inhaler Inhale 2 puffs into the lungs every 6 (six) hours as needed for wheezing or shortness of breath. 8 g 2   aspirin EC 81 MG tablet Take 81 mg by mouth daily. Swallow whole.     atorvastatin (LIPITOR) 20 MG tablet TAKE ONE TABLET BY MOUTH EVERY EVENING 90 tablet 2   clopidogrel (PLAVIX) 75 MG tablet TAKE ONE TABLET BY MOUTH EVERY MORNING 90 tablet 2   gabapentin (NEURONTIN) 300 MG capsule Take 2 capsules (600 mg total) by mouth 3 (three) times daily. 90 capsule 2   glimepiride (AMARYL) 4 MG tablet Take 1 tablet (4 mg total) by mouth daily. 30 tablet 0   HYDROcodone-acetaminophen (NORCO) 10-325 MG tablet Take 1 tablet by mouth 3 (three) times daily as needed for back pain. 90 tablet 0   HYDROcodone-acetaminophen (NORCO) 10-325 MG tablet Take 1 tablet by mouth 3 (three) times daily as needed for back pain. 90 tablet 0   Iron, Ferrous Sulfate, 325 (65 Fe) MG TABS Take 325 mg by mouth daily. 90 tablet 0   metFORMIN (GLUCOPHAGE) 1000 MG tablet Take 1 tablet (1,000 mg total) by mouth 2 (two) times daily with a meal. 60 tablet 0   Omega-3 Fatty Acids (FISH OIL) 1000 MG CAPS Take 2 capsules (2,000 mg total) by mouth 2 (two) times daily. 180 capsule 12   Semaglutide (RYBELSUS) 7 MG TABS Take 1 tablet (7 mg total) by mouth daily. 90 tablet 1    valsartan-hydrochlorothiazide (DIOVAN-HCT) 160-12.5 MG tablet Take 1 tablet by mouth daily. 90 tablet 0   No current facility-administered medications for this visit.    Family History  Problem Relation Age of Onset   Heart disease Brother    Leukemia Brother     Social History   Socioeconomic History   Marital status: Legally Separated    Spouse name: Not on file   Number of children: 2   Years of education: 11   Highest education level: Not on file  Occupational History   Occupation: unemployed    Comment: works for himself   Occupation: [employer]  Tobacco Use   Smoking status: Every Day    Packs/day: 0.50    Years: 25.00    Additional pack years: 0.00    Total pack years: 12.50    Types: Cigarettes    Passive exposure: Never   Smokeless tobacco: Never  Vaping Use   Vaping Use: Never used  Substance and Sexual Activity   Alcohol use: Not Currently    Alcohol/week: 0.0 standard drinks of alcohol   Drug use: No   Sexual activity: Not Currently  Other Topics Concern   Not on file  Social History Narrative   PT HAS VERY VAGUE FAMILY HISTORY - HE WAS RAISED IN [facility]   Social Determinants of Health   Financial Resource Strain: Low Risk  (12/21/2021)   Overall Financial Resource Strain (CARDIA)    Difficulty of Paying Living Expenses: Not hard at all  Food Insecurity: No Food Insecurity (06/30/2022)   Hunger Vital Sign    Worried About Running Out of Food in the Last Year: Never true    Ran Out of Food in the Last Year: Never true  Transportation Needs: No Transportation Needs (06/30/2022)   PRAPARE - Hydrologist (Medical): No    Lack of Transportation (Non-Medical): No  Physical Activity: Inactive (06/16/2020)   Exercise Vital Sign    Days of Exercise per Week: 0 days    Minutes of Exercise per Session: 30 min  Stress: Stress Concern Present (06/16/2020)   [place]    Feeling of Stress : To some extent  Social Connections: Socially Isolated (06/16/2020)   Social Connection and Isolation Panel [NHANES]    Frequency of Communication with Friends and Family: Three times a week    Frequency of Social Gatherings with Friends and Family: Never    Attends Religious Services: Never    Marine scientist or Organizations: No    Attends Archivist Meetings: Never    Marital Status: Divorced  Human resources officer Violence: Not At Risk (06/30/2022)   Humiliation, Afraid, Rape, and Kick questionnaire    Fear of Current or Ex-Partner: No    Emotionally Abused: No    Physically Abused: No    Sexually Abused: No     REVIEW OF SYSTEMS:   [X]  denotes positive finding, [ ]  denotes negative finding Cardiac  Comments:  Chest pain or chest pressure:    Shortness of breath upon exertion:    Short of breath when lying flat:    Irregular heart rhythm:        Vascular    Pain in calf, thigh, or hip brought on by ambulation:    Pain in feet at night that wakes you up from your sleep:  x   Blood clot in your veins:    Leg swelling:  x Left foot      Pulmonary    Oxygen at home:    Productive cough:     Wheezing:         Neurologic    Sudden weakness in arms or legs:     Sudden numbness in arms or legs:     Sudden onset of difficulty speaking or slurred speech:    Temporary loss of vision in one eye:     Problems with dizziness:         Gastrointestinal    Blood in stool:     Vomited blood:  Genitourinary    Burning when urinating:     Blood in urine:        Psychiatric    Major depression:         Hematologic    Bleeding problems:    Problems with blood clotting too easily:        Skin    Rashes or ulcers:        Constitutional    Fever or chills:      PHYSICAL EXAMINATION:  Today's Vitals   07/27/22 0832  BP: (!) 190/70  Pulse: 64  Resp: 20  Temp: 98.7 F (37.1 C)  TempSrc: Temporal  SpO2:  97%  Weight: 146 lb 8 oz (66.5 kg)  Height: 5\' 10"  (1.778 m)  PainSc: 9    Body mass index is 21.02 kg/m.   General:  WDWN in NAD; vital signs documented above Gait: Not observed HENT: WNL, normocephalic Pulmonary: normal non-labored breathing , without wheezing Cardiac: regular HR, without carotid bruits Abdomen: soft, NT; aortic pulse is not palpable Skin: without rashes Vascular Exam/Pulses:  Right Left  Radial 2+ (normal) 2+ (normal)  Femoral 2+ (normal) 1+ (weak)  Popliteal Unable to palpate Unable to palpate  DP 2+ (normal) Unable to palpate  PT Unable to palpate Unable to palpate   Extremities: left foot with mild swelling and erythematous with new wound between the partially amputated 4th toe and 5th toe; no issues with right foot.  Musculoskeletal: no muscle wasting or atrophy  Neurologic: A&O X 3 Psychiatric:  The pt has Normal affect.   Non-Invasive Vascular Imaging:   ABI's/TBI's on 07/27/2022: Right:  0.82/0/63 - Great toe pressure: 112 Left:  0.39/na dampened monophasic waveforms  Arterial duplex on 07/27/2022: +----------+--------+-----+--------+--------+--------+  LEFT     PSV cm/sRatioStenosisWaveformComments  +----------+--------+-----+--------+--------+--------+  CFA Distal132                                    +----------+--------+-----+--------+--------+--------+  DFA      185                                    +----------+--------+-----+--------+--------+--------+  SFA Prox  112                                    +----------+--------+-----+--------+--------+--------+  SFA Mid   83                                     +----------+--------+-----+--------+--------+--------+  POP Prox  95                                     +----------+--------+-----+--------+--------+--------+  POP Distal54                                     +----------+--------+-----+--------+--------+--------+   Left Stent(s):   +---------------+--------+---------------+----------+--------+  SFA           PSV cm/sStenosis       Waveform  Comments  +---------------+--------+---------------+----------+--------+  Prox to Stent  83  monophasic          +---------------+--------+---------------+----------+--------+  Proximal Stent 457     50-99% stenosismonophasic          +---------------+--------+---------------+----------+--------+  Mid Stent      549     50-99% stenosismonophasic          +---------------+--------+---------------+----------+--------+  Distal Stent   165                    monophasic          +---------------+--------+---------------+----------+--------+  Distal to Stent55                     monophasic          +---------------+--------+---------------+----------+--------+    Summary:  Left: 50-99% stenosis involving the left distal SFA stent.  Stenosis appears to begin approximately 1.72 cm from the origin of the stent.    Previous ABI's/TBI's on 03/20/2022: Right:  0.76/.0.47 - Great toe pressure: 77 Left:  0.58/0.53 - Great toe pressure:  87  Previous arterial duplex on 03/20/2022: +----------+--------+-----+---------------+----------+--------+  LEFT     PSV cm/sRatioStenosis       Waveform  Comments  +----------+--------+-----+---------------+----------+--------+  CFA Mid   151          30-49% stenosismonophasic          +----------+--------+-----+---------------+----------+--------+  CFA Distal172          30-49% stenosismonophasic          +----------+--------+-----+---------------+----------+--------+  DFA      230          50-74% stenosismonophasic          +----------+--------+-----+---------------+----------+--------+  SFA Prox  236          50-74% stenosismonophasic          +----------+--------+-----+---------------+----------+--------+  SFA Mid   106                                              +----------+--------+-----+---------------+----------+--------+  SFA Distal                                      stent     +----------+--------+-----+---------------+----------+--------+  POP Prox  128                         monophasic          +----------+--------+-----+---------------+----------+--------+  POP Distal86                          monophasic          +----------+--------+-----+---------------+----------+--------+  ATA Distal41                          monophasic          +----------+--------+-----+---------------+----------+--------+  PTA Distal38                          monophasic          +----------+--------+-----+---------------+----------+--------+   Left Stent(s):  +---------------+--------+--------+----------+--------+  distal SFA     PSV cm/sStenosisWaveform  Comments  +---------------+--------+--------+----------+--------+  Prox to Stent  121  monophasic          +---------------+--------+--------+----------+--------+  Proximal Stent 97              monophasic          +---------------+--------+--------+----------+--------+  Mid Stent      92              monophasic          +---------------+--------+--------+----------+--------+  Distal Stent   91              monophasic          +---------------+--------+--------+----------+--------+  Distal to Stent76              monophasic          +---------------+--------+--------+----------+--------+   Summary:  Left: 30-49% stenosis noted in the common femoral artery.  50-74% stenosis noted in the deep femoral artery.  50-74% stenosis noted in the proximal superficial femoral artery.  Widely patent distal superficial femoral artery stent.     ASSESSMENT/PLAN:: 67 y.o. male here for follow up for PAD with hx of PAD and hx of left SFA stenting for toe ulcer in January 2023 and left SFA drug coated balloon angioplasty for  rest pain by Dr. Trula Slade.  Pt now has rest pain and wound b/w the 4th and 5th toes  Critical limb ischemia with rest pain and wound -pt comes in today with new rest pain and a wound between the partially auto amputated 4th toe and 5th toe.  His foot is erythematous and swollen.  On duplex today, he has  significantly elevated velocities in the LLE involving the stent.  I have sent a prescription for doxycycline to his pharmacy and we will set him up for angiogram of the LLE tomorrow.  Discussed with pt that if there is an intervention that can happen, he will do it at that time, but could need surgery pending results.  He expressed understanding.  Discussed with pt that he should not have any procedures on his foot until his blood flow is optimized.  -continue asa/statin/plavix  Current smoker -discussion about critical nature of smoking cessation and he is at risk of limb loss.  Also discussed he is at risk for heart attack and stroke.     Leontine Locket, [facility] [phone]  Clinic MD:   Scot Dock

## 2022-07-28 ENCOUNTER — Ambulatory Visit (HOSPITAL_COMMUNITY)
Admission: RE | Admit: 2022-07-28 | Discharge: 2022-07-28 | Disposition: A | Discharge status: Home / Self Care | Payer: PPO | Attending: Vascular Surgery | Admitting: Vascular Surgery

## 2022-07-28 ENCOUNTER — Encounter (HOSPITAL_COMMUNITY)
Admission: RE | Disposition: A | Discharge status: Home / Self Care | Payer: Self-pay | Source: Home / Self Care | Attending: Vascular Surgery

## 2022-07-28 ENCOUNTER — Other Ambulatory Visit: Payer: Self-pay

## 2022-07-28 DIAGNOSIS — Z7902 Long term (current) use of antithrombotics/antiplatelets: Secondary | ICD-10-CM | POA: Diagnosis not present

## 2022-07-28 DIAGNOSIS — L97529 Non-pressure chronic ulcer of other part of left foot with unspecified severity: Secondary | ICD-10-CM | POA: Insufficient documentation

## 2022-07-28 DIAGNOSIS — Z79899 Other long term (current) drug therapy: Secondary | ICD-10-CM | POA: Insufficient documentation

## 2022-07-28 DIAGNOSIS — E11621 Type 2 diabetes mellitus with foot ulcer: Secondary | ICD-10-CM | POA: Insufficient documentation

## 2022-07-28 DIAGNOSIS — I10 Essential (primary) hypertension: Secondary | ICD-10-CM | POA: Diagnosis not present

## 2022-07-28 DIAGNOSIS — E1151 Type 2 diabetes mellitus with diabetic peripheral angiopathy without gangrene: Secondary | ICD-10-CM | POA: Insufficient documentation

## 2022-07-28 DIAGNOSIS — Z7984 Long term (current) use of oral hypoglycemic drugs: Secondary | ICD-10-CM | POA: Insufficient documentation

## 2022-07-28 DIAGNOSIS — Z7982 Long term (current) use of aspirin: Secondary | ICD-10-CM | POA: Insufficient documentation

## 2022-07-28 DIAGNOSIS — F17210 Nicotine dependence, cigarettes, uncomplicated: Secondary | ICD-10-CM | POA: Diagnosis not present

## 2022-07-28 DIAGNOSIS — I70245 Atherosclerosis of native arteries of left leg with ulceration of other part of foot: Secondary | ICD-10-CM | POA: Diagnosis not present

## 2022-07-28 DIAGNOSIS — I70222 Atherosclerosis of native arteries of extremities with rest pain, left leg: Secondary | ICD-10-CM

## 2022-07-28 HISTORY — PX: ABDOMINAL AORTOGRAM W/LOWER EXTREMITY: CATH118223

## 2022-07-28 HISTORY — PX: PERIPHERAL VASCULAR INTERVENTION: CATH118257

## 2022-07-28 LAB — POCT I-STAT, CHEM 8
BUN: 28 mg/dL — ABNORMAL HIGH (ref 8–23)
Calcium, Ion: 1.03 mmol/L — ABNORMAL LOW (ref 1.15–1.40)
Chloride: 106 mmol/L (ref 98–111)
Creatinine, Ser: 0.9 mg/dL (ref 0.61–1.24)
Glucose, Bld: 108 mg/dL — ABNORMAL HIGH (ref 70–99)
HCT: 39 % (ref 39.0–52.0)
Hemoglobin: 13.3 g/dL (ref 13.0–17.0)
Potassium: 4.7 mmol/L (ref 3.5–5.1)
Sodium: 139 mmol/L (ref 135–145)
TCO2: 24 mmol/L (ref 22–32)

## 2022-07-28 SURGERY — ABDOMINAL AORTOGRAM W/LOWER EXTREMITY
Anesthesia: LOCAL

## 2022-07-28 MED ORDER — HEPARIN SODIUM (PORCINE) 1000 UNIT/ML IJ SOLN
INTRAMUSCULAR | Status: AC
  Filled 2022-07-28: qty 10

## 2022-07-28 MED ORDER — SODIUM CHLORIDE 0.9 % WEIGHT BASED INFUSION: 1.0000 mL/kg/h | INTRAVENOUS | Status: DC

## 2022-07-28 MED ORDER — MIDAZOLAM HCL 2 MG/2ML IJ SOLN
INTRAMUSCULAR | Status: DC | PRN
  Administered 2022-07-28: 1 mg via INTRAVENOUS

## 2022-07-28 MED ORDER — FENTANYL CITRATE (PF) 100 MCG/2ML IJ SOLN
INTRAMUSCULAR | Status: AC
  Filled 2022-07-28: qty 2

## 2022-07-28 MED ORDER — IODIXANOL 320 MG/ML IV SOLN
INTRAVENOUS | Status: DC | PRN
  Administered 2022-07-28: 135 mL

## 2022-07-28 MED ORDER — SODIUM CHLORIDE 0.9 % IV SOLN: 250.0000 mL | INTRAVENOUS | Status: DC | PRN

## 2022-07-28 MED ORDER — FENTANYL CITRATE (PF) 100 MCG/2ML IJ SOLN
INTRAMUSCULAR | Status: DC | PRN
  Administered 2022-07-28: 25 ug via INTRAVENOUS

## 2022-07-28 MED ORDER — LABETALOL HCL 5 MG/ML IV SOLN: 10.0000 mg | INTRAVENOUS | Status: DC | PRN

## 2022-07-28 MED ORDER — LABETALOL HCL 5 MG/ML IV SOLN
INTRAVENOUS | Status: AC
  Filled 2022-07-28: qty 4

## 2022-07-28 MED ORDER — PROTAMINE SULFATE 10 MG/ML IV SOLN
INTRAVENOUS | Status: DC | PRN
  Administered 2022-07-28: 45 mg via INTRAVENOUS
  Administered 2022-07-28: 5 mg via INTRAVENOUS

## 2022-07-28 MED ORDER — SODIUM CHLORIDE 0.9 % IV SOLN: INTRAVENOUS | Status: DC

## 2022-07-28 MED ORDER — ACETAMINOPHEN 325 MG PO TABS: 650.0000 mg | ORAL_TABLET | ORAL | Status: DC | PRN

## 2022-07-28 MED ORDER — ONDANSETRON HCL 4 MG/2ML IJ SOLN: 4.0000 mg | Freq: Four times a day (QID) | INTRAMUSCULAR | Status: DC | PRN

## 2022-07-28 MED ORDER — LIDOCAINE HCL (PF) 1 % IJ SOLN
INTRAMUSCULAR | Status: DC | PRN
  Administered 2022-07-28: 18 mL

## 2022-07-28 MED ORDER — HEPARIN (PORCINE) IN NACL 1000-0.9 UT/500ML-% IV SOLN
INTRAVENOUS | Status: DC | PRN
  Administered 2022-07-28 (×2): 500 mL

## 2022-07-28 MED ORDER — LABETALOL HCL 5 MG/ML IV SOLN
INTRAVENOUS | Status: DC | PRN
  Administered 2022-07-28: 20 mg via INTRAVENOUS

## 2022-07-28 MED ORDER — SODIUM CHLORIDE 0.9% FLUSH: 3.0000 mL | INTRAVENOUS | Status: DC | PRN

## 2022-07-28 MED ORDER — PROTAMINE SULFATE 10 MG/ML IV SOLN
INTRAVENOUS | Status: AC
  Filled 2022-07-28: qty 5

## 2022-07-28 MED ORDER — MIDAZOLAM HCL 2 MG/2ML IJ SOLN
INTRAMUSCULAR | Status: AC
  Filled 2022-07-28: qty 2

## 2022-07-28 MED ORDER — HYDRALAZINE HCL 20 MG/ML IJ SOLN: 5.0000 mg | INTRAMUSCULAR | Status: DC | PRN

## 2022-07-28 MED ORDER — HEPARIN SODIUM (PORCINE) 1000 UNIT/ML IJ SOLN
INTRAMUSCULAR | Status: DC | PRN
  Administered 2022-07-28: 7000 [IU] via INTRAVENOUS

## 2022-07-28 MED ORDER — LIDOCAINE HCL (PF) 1 % IJ SOLN
INTRAMUSCULAR | Status: AC
  Filled 2022-07-28: qty 30

## 2022-07-28 MED ORDER — SODIUM CHLORIDE 0.9% FLUSH: 3.0000 mL | Freq: Two times a day (BID) | INTRAVENOUS | Status: DC

## 2022-07-28 SURGICAL SUPPLY — 18 items
BALLN MUSTANG 4X60X135 (BALLOONS) ×2
BALLOON MUSTANG 4X60X135 (BALLOONS) IMPLANT
CATH OMNI FLUSH 5F 65CM (CATHETERS) IMPLANT
CLOSURE PERCLOSE PROSTYLE (VASCULAR PRODUCTS) IMPLANT
GLIDEWIRE ADV .035X260CM (WIRE) IMPLANT
KIT MICROPUNCTURE NIT STIFF (SHEATH) IMPLANT
KIT PV (KITS) ×3 IMPLANT
SHEATH CATAPULT 6F 45 MP (SHEATH) IMPLANT
SHEATH PINNACLE 5F 10CM (SHEATH) IMPLANT
SHEATH PROBE COVER 6X72 (BAG) IMPLANT
STENT ZILVER PTX 5X60 (Permanent Stent) IMPLANT
STOPCOCK MORSE 400PSI 3WAY (MISCELLANEOUS) IMPLANT
SYR MEDRAD MARK 7 150ML (SYRINGE) ×3 IMPLANT
TRANSDUCER W/STOPCOCK (MISCELLANEOUS) ×3 IMPLANT
TRAY PV CATH (CUSTOM PROCEDURE TRAY) ×3 IMPLANT
TUBING CIL FLEX 10 FLL-RA (TUBING) IMPLANT
WIRE STARTER BENTSON 035X150 (WIRE) IMPLANT
WIRE TORQFLEX AUST .018X40CM (WIRE) IMPLANT

## 2022-07-28 NOTE — Discharge Instructions (Signed)
Femoral Site Care This sheet gives you information about how to care for yourself after your procedure. Your health care provider may also give you more specific instructions. If you have problems or questions, contact your health care provider. What can I expect after the procedure?  After the procedure, it is common to have: Bruising that usually fades within 1-2 weeks. Tenderness at the site. Follow these instructions at home: Wound care Follow instructions from your health care provider about how to take care of your insertion site. Make sure you: Wash your hands with soap and water before you change your bandage (dressing). If soap and water are not available, use hand sanitizer. Remove your dressing as told by your health care provider. 24 hours Do not take baths, swim, or use a hot tub until your health care provider approves. You may shower 24-48 hours after the procedure or as told by your health care provider. Gently wash the site with plain soap and water. Pat the area dry with a clean towel. Do not rub the site. This may cause bleeding. Do not apply powder or lotion to the site. Keep the site clean and dry. Check your femoral site every day for signs of infection. Check for: Redness, swelling, or pain. Fluid or blood. Warmth. Pus or a bad smell. Activity For the first 2-3 days after your procedure, or as long as directed: Avoid climbing stairs as much as possible. Do not squat. Do not lift anything that is heavier than 10 lb (4.5 kg), or the limit that you are told, until your health care provider says that it is safe. For 5 days Rest as directed. Avoid sitting for a long time without moving. Get up to take short walks every 1-2 hours. Do not drive for 24 hours if you were given a medicine to help you relax (sedative). General instructions Take over-the-counter and prescription medicines only as told by your health care provider. Keep all follow-up visits as told by your  health care provider. This is important. Contact a health care provider if you have: A fever or chills. You have redness, swelling, or pain around your insertion site. Get help right away if: The catheter insertion area swells very fast. You pass out. You suddenly start to sweat or your skin gets clammy. The catheter insertion area is bleeding, and the bleeding does not stop when you hold steady pressure on the area. The area near or just beyond the catheter insertion site becomes pale, cool, tingly, or numb. These symptoms may represent a serious problem that is an emergency. Do not wait to see if the symptoms will go away. Get medical help right away. Call your local emergency services (911 in the [place] Do not drive yourself to the hospital. Summary After the procedure, it is common to have bruising that usually fades within 1-2 weeks. Check your femoral site every day for signs of infection. Do not lift anything that is heavier than 10 lb (4.5 kg), or the limit that you are told, until your health care provider says that it is safe. This information is not intended to replace advice given to you by your health care provider. Make sure you discuss any questions you have with your health care provider. Document Revised: 05/07/2017 Document Reviewed: 05/07/2017 Elsevier Patient Education  2020 Elsevier Inc.  

## 2022-07-28 NOTE — Op Note (Signed)
DATE OF SERVICE: 07/28/2022  PATIENT:  Andrew Wiggins  67 y.o. male  PRE-OPERATIVE DIAGNOSIS:  Atherosclerosis of native arteries of left lower extremity causing ulceration  POST-OPERATIVE DIAGNOSIS:  Same  PROCEDURE:   1) Ultrasound guided right common femoral artery access 2) Aortogram 3) Left lower extremity angiogram with third order cannulation (144mL total contrast) 4) Left superficial femoral artery angioplasty stenting (5x63mm Zilver) 5) Conscious sedation (33 minutes)  SURGEON:  Yevonne Aline. Stanford Breed, MD  ASSISTANT: none  ANESTHESIA:   local and IV sedation  ESTIMATED BLOOD LOSS: minimal  LOCAL MEDICATIONS USED:  LIDOCAINE   COUNTS: confirmed correct.  PATIENT DISPOSITION:  PACU - hemodynamically stable.   Delay start of Pharmacological VTE agent (>24hrs) due to surgical blood loss or risk of bleeding: no  INDICATION FOR PROCEDURE: Andrew Wiggins is a 67 y.o. male with left foot ulceration with known peripheral arterial disease. After careful discussion of risks, benefits, and alternatives the patient was offered angiography.  The patient understood and wished to proceed.  OPERATIVE FINDINGS:  Terminal aorta and iliac arteries: Aorta widely patent Small iliac arteries without flow limiting stenosis  Left lower extremity: Common femoral artery: diffuse disease without stenosis  Profunda femoris artery: diffuse disease without stenosis  Superficial femoral artery: proximally diminutive and diseased, but patent. Prior SFA stenting with ~95% in-stent restenosis.  Popliteal artery: Reconstitution in distal margin of stent. Remainder of popliteal artery diffuse disease without stenosis Anterior tibial artery: diffuse disease without stenosis Tibioperoneal trunk: diffuse disease without stenosis Peroneal artery: diffuse disease without stenosis Posterior tibial artery: diffuse disease without stenosis Pedal circulation: not well seen because of sluggish transit of  contrast  GLASS score. FP1. IP0. Stage 1.  WIfI score. 1 / 3 / 0. Stage III.  DESCRIPTION OF PROCEDURE: After identification of the patient in the pre-operative holding area, the patient was transferred to the operating room. The patient was positioned supine on the operating room table. Anesthesia was induced. The groins was prepped and draped in standard fashion. A surgical pause was performed confirming correct patient, procedure, and operative location.  The right groin was anesthetized with subcutaneous injection of 1% lidocaine. Using ultrasound guidance, the right common femoral artery was accessed with micropuncture technique. Fluoroscopy was used to confirm cannulation over the femoral head. The 44F sheath was upsized to 18F.   A Benson wire was advanced into the distal aorta. Over the wire an omni flush catheter was advanced to the level of L2. Aortogram was performed - see above for details.   The left common iliac artery was selected with an omniflush catheter and glidewire advantage guidewire. The wire was advanced into the common femoral artery. Over the wire the omni flush catheter was advanced into the external iliac artery. Selective angiography was performed - see above for details.   The decision was made to intervene. The patient was heparinized with 7,000 units of heparin. The 18F sheath was exchanged for a 58F x 45cm sheath. Selective angiography of the left lower extremity was performed prior to intervention.   The lesions were treated with: Left superficial femoral artery angioplasty stenting (5x33mm Zilver)  Completion angiography revealed:  Resolution of SFA in-stent restenosis  A perclose failed to close the arteriotomy. Manual pressure was held. Hemostasis was excellent upon completion.  Conscious sedation was administered with the use of IV fentanyl and midazolam under continuous physician and nurse monitoring.  Heart rate, blood pressure, and oxygen saturation were  continuously monitored.  Total sedation time  was 33 minutes  Upon completion of the case instrument and sharps counts were confirmed correct. The patient was transferred to the PACU in good condition. I was present for all portions of the procedure.  PLAN: ASA 81mg  PO QD. Plavix 75mg  PO QD. High intensity statin therapy. Follow up with me or V[facility] in 4 weeks with ABI and LLE duplex.   Yevonne Aline. Stanford Breed, MD [facility] Phone Number: [phone] 07/28/2022 12:31 PM

## 2022-07-28 NOTE — Interval H&P Note (Signed)
History and Physical Interval Note:  07/28/2022 12:31 PM  Andrew Wiggins  has presented today for surgery, with the diagnosis of critical limb ischemia of left lower extremity.  The various methods of treatment have been discussed with the patient and family. After consideration of risks, benefits and other options for treatment, the patient has consented to  Procedure(s) with comments: ABDOMINAL AORTOGRAM W/LOWER EXTREMITY (N/A) PERIPHERAL VASCULAR INTERVENTION (Left) - Lt SFA as a surgical intervention.  The patient's history has been reviewed, patient examined, no change in status, stable for surgery.  I have reviewed the patient's chart and labs.  Questions were answered to the patient's satisfaction.     Cherre Robins

## 2022-07-31 ENCOUNTER — Other Ambulatory Visit: Payer: Self-pay | Admitting: Nurse Practitioner

## 2022-07-31 ENCOUNTER — Encounter (HOSPITAL_COMMUNITY): Payer: Self-pay | Admitting: Vascular Surgery

## 2022-07-31 ENCOUNTER — Telehealth: Payer: Self-pay | Admitting: Vascular Surgery

## 2022-07-31 DIAGNOSIS — E0840 Diabetes mellitus due to underlying condition with diabetic neuropathy, unspecified: Secondary | ICD-10-CM

## 2022-07-31 NOTE — Telephone Encounter (Signed)
-----   Message from Cherre Robins, MD sent at 07/28/2022 12:39 PM EDT ----- Andrew Wiggins 07/28/2022 Procedure: 1) Ultrasound guided right common femoral artery access 2) Aortogram 3) Left lower extremity angiogram with third order cannulation (167mL total contrast) 4) Left superficial femoral artery angioplasty stenting (5x24mm Zilver) 5) Conscious sedation (33 minutes)    Assistant: none Follow up: 4 weeks with [facility] or me Studies for follow up: ABI and LLE duplex  Thank you! Gershon Mussel

## 2022-08-01 ENCOUNTER — Telehealth: Payer: Self-pay

## 2022-08-01 ENCOUNTER — Other Ambulatory Visit: Payer: Self-pay

## 2022-08-01 DIAGNOSIS — D500 Iron deficiency anemia secondary to blood loss (chronic): Secondary | ICD-10-CM

## 2022-08-01 DIAGNOSIS — E782 Mixed hyperlipidemia: Secondary | ICD-10-CM

## 2022-08-01 MED ORDER — IRON (FERROUS SULFATE) 325 (65 FE) MG PO TABS: 325.0000 mg | ORAL_TABLET | Freq: Every day | ORAL | 0 refills | Status: DC

## 2022-08-01 MED ORDER — VALSARTAN-HYDROCHLOROTHIAZIDE 160-12.5 MG PO TABS: 1.0000 | ORAL_TABLET | Freq: Every day | ORAL | 0 refills | Status: DC

## 2022-08-01 MED ORDER — FISH OIL 1000 MG PO CAPS: 2.0000 | ORAL_CAPSULE | Freq: Two times a day (BID) | ORAL | 12 refills | Status: DC

## 2022-08-01 NOTE — Progress Notes (Signed)
Care Management & Coordination Services Pharmacy Team  Reason for Encounter: Medication coordination and delivery  Contacted patient to discuss medications and coordinate delivery from [facility].  Spoke with patient on 08/01/2022   Cycle dispensing form sent to [place] [facility] for review.   Last adherence delivery date:07/13/22      Patient is due for next adherence delivery on: 08/11/22  This delivery to include: Adherence Packaging  30 Days  Aspirin 81mg  1 at EM Atorvastatin 20mg  1 at EM Metformin 1000mg  1 at B 1 at EM Clopidogrel 75mg  1 at B Gabapentin 300mg  2 at B, 2 at L, 2 at EM Valsartan-HCTZ 160-12.5 1 B       Iron 325mg  1 B   Omega 3 1000mg  1 B  Patient declined the following medications this month: Glimepiride 4mg -LF 06/06/22 90ds Albuterol Inhaler-LF 06/21/22 25ds Rybelsus 7mg - Getting PAP. On last bottle. Pt never did submit his 2024 re-enrollment application. I advised him to pick that up from Dr. Tobie Poet office asap to continue processing. Pt stated he will pick this up this week Hydrocodone 10-325mg - Picked up at 07/24/22 30ds at [facility] requested from providers include: Valsartan-HCTZ 160-12.5 Iron 325mg   Omega 3 1000mg    Confirmed delivery date of 08/11/22, advised patient that pharmacy will contact them the morning of delivery.   Any concerns about your medications? No  How often do you forget or accidentally miss a dose? Rarely  Do you use a pillbox? No  Is patient in packaging Yes  If yes  What is the date on your next pill pack? 08/01/22  Any concerns or issues with your packaging? No concerns   Recent blood pressure readings are as follows: 3/22/4 148/78, 07/27/22 148/82  Recent blood glucose readings are as follows: last week 124, 126  Pt was in the hospital for Limb Ischemia and has been dealing with this and has not been checking BP or BS daily but here's the last few readings he did take.   Chart review: Recent office visits:   None  Recent consult visits:  07/27/22 (Vascular surgery) Leontine Locket PA-C. Seen for Limb Ischemia. Started on Doxycycline Hyclate 100mg .   Hospital visits:  Medication Reconciliation was completed by comparing discharge summary, patient's EMR and Pharmacy list, and upon discussion with patient.  Admitted to the hospital on 07/28/22 due to Abdominal Aortogram. Discharge date was 07/28/22. Discharged from [facility].    -All medications will remain the same.    Medications: Outpatient Encounter Medications as of 08/01/2022  Medication Sig   albuterol (VENTOLIN HFA) 108 (90 Base) MCG/ACT inhaler Inhale 2 puffs into the lungs every 6 (six) hours as needed for wheezing or shortness of breath.   aspirin EC 81 MG tablet Take 81 mg by mouth daily. Swallow whole.   atorvastatin (LIPITOR) 20 MG tablet TAKE ONE TABLET BY MOUTH EVERY EVENING   clopidogrel (PLAVIX) 75 MG tablet TAKE ONE TABLET BY MOUTH EVERY MORNING   doxycycline (VIBRAMYCIN) 100 MG capsule Take 1 capsule (100 mg total) by mouth 2 (two) times daily.   gabapentin (NEURONTIN) 300 MG capsule Take 2 capsules (600 mg total) by mouth 3 (three) times daily.   glimepiride (AMARYL) 4 MG tablet Take 1 tablet (4 mg total) by mouth daily.   HYDROcodone-acetaminophen (NORCO) 10-325 MG tablet Take 1 tablet by mouth 3 (three) times daily as needed for back pain.   HYDROcodone-acetaminophen (NORCO) 10-325 MG tablet Take 1 tablet by mouth 3 (three) times daily as  needed for back pain.   Iron, Ferrous Sulfate, 325 (65 Fe) MG TABS Take 325 mg by mouth daily.   metFORMIN (GLUCOPHAGE) 1000 MG tablet Take 1 tablet (1,000 mg total) by mouth 2 (two) times daily with a meal.   Omega-3 Fatty Acids (FISH OIL) 1000 MG CAPS Take 2 capsules (2,000 mg total) by mouth 2 (two) times daily.   Semaglutide (RYBELSUS) 7 MG TABS Take 1 tablet (7 mg total) by mouth daily.   valsartan-hydrochlorothiazide (DIOVAN-HCT) 160-12.5 MG tablet Take 1 tablet by mouth  daily.   No facility-administered encounter medications on file as of 08/01/2022.   BP Readings from Last 3 Encounters:  07/28/22 (!) 123/58  07/27/22 (!) 190/70  07/14/22 138/74    Pulse Readings from Last 3 Encounters:  07/28/22 92  07/27/22 64  07/14/22 91    Lab Results  Component Value Date/Time   HGBA1C 5.5 06/21/2022 09:16 AM   HGBA1C 8.6 (H) 12/23/2021 09:28 AM   Lab Results  Component Value Date   CREATININE 0.90 07/28/2022   BUN 28 (H) 07/28/2022   GFR 83.08 12/20/2017   GFRNONAA 90 11/18/2019   GFRAA 104 11/18/2019   NA 139 07/28/2022   K 4.7 07/28/2022   CALCIUM 9.6 06/21/2022   CO2 22 06/21/2022     Elray Mcgregor, [place] Clinical Pharmacist Assistant  [phone]

## 2022-08-03 ENCOUNTER — Other Ambulatory Visit: Payer: Self-pay | Admitting: Physician Assistant

## 2022-08-08 ENCOUNTER — Other Ambulatory Visit: Payer: Self-pay

## 2022-08-09 ENCOUNTER — Other Ambulatory Visit: Payer: Self-pay

## 2022-08-09 DIAGNOSIS — E0840 Diabetes mellitus due to underlying condition with diabetic neuropathy, unspecified: Secondary | ICD-10-CM

## 2022-08-09 MED ORDER — GABAPENTIN 300 MG PO CAPS: 600.0000 mg | ORAL_CAPSULE | Freq: Three times a day (TID) | ORAL | 2 refills | Status: DC

## 2022-08-16 ENCOUNTER — Other Ambulatory Visit: Payer: Self-pay | Admitting: Physician Assistant

## 2022-08-16 DIAGNOSIS — I739 Peripheral vascular disease, unspecified: Secondary | ICD-10-CM

## 2022-08-16 MED ORDER — DOXYCYCLINE HYCLATE 100 MG PO CAPS: 100.0000 mg | ORAL_CAPSULE | Freq: Two times a day (BID) | ORAL | 0 refills | Status: DC

## 2022-08-21 NOTE — Progress Notes (Unsigned)
  Chronic Care Management Note  08/21/2022 Name: Andrew Wiggins MRN: [id number] DOB: Jan 19, 1956  Andrew Wiggins is a 67 y.o. year old male who is a primary care patient of Aryal, Lonia Farber, FNP (Inactive) and is actively engaged with the Chronic Care Management team. I reached out to Andrew Wiggins by phone today to assist with re-scheduling an initial visit with the RN Case Manager  Follow up plan: Unsuccessful telephone outreach attempt made. A HIPAA compliant phone message was left for the patient providing contact information and requesting a return call.  The care management team will reach out to the patient again over the next 7 days.  If patient returns call to provider office, please advise to call CCM Care Guide Penne Lash  at [phone]  Penne Lash, RMA Care Guide [facility]  [place], [place] [address] Direct Dial: [phone] [email or web address]

## 2022-08-22 ENCOUNTER — Other Ambulatory Visit (HOSPITAL_COMMUNITY): Payer: Self-pay

## 2022-08-23 NOTE — Progress Notes (Signed)
  Chronic Care Management Note  08/23/2022 Name: Andrew Wiggins MRN: [id number] DOB: [birth year]  Andrew Wiggins is a 67 y.o. year old male who is a primary care patient of Aryal, Lonia Farber, FNP (Inactive) and is actively engaged with the Chronic Care Management team. I reached out to Enos Fling by phone today to assist with re-scheduling an initial visit with the RN Case Manager  Follow up plan: Unable to make contact on outreach attempts x 3. PCP Lurline Del, FNP (Inactive) notified via routed documentation in medical record.   Penne Lash, RMA Care Guide [facility]  [place], [place] [address] Direct Dial: [phone] [email or web address]

## 2022-08-24 ENCOUNTER — Other Ambulatory Visit: Payer: Self-pay | Admitting: *Deleted

## 2022-08-24 ENCOUNTER — Other Ambulatory Visit (HOSPITAL_COMMUNITY): Payer: Self-pay

## 2022-08-24 DIAGNOSIS — M169 Osteoarthritis of hip, unspecified: Secondary | ICD-10-CM | POA: Diagnosis not present

## 2022-08-24 DIAGNOSIS — M47818 Spondylosis without myelopathy or radiculopathy, sacral and sacrococcygeal region: Secondary | ICD-10-CM | POA: Diagnosis not present

## 2022-08-24 DIAGNOSIS — M48061 Spinal stenosis, lumbar region without neurogenic claudication: Secondary | ICD-10-CM | POA: Diagnosis not present

## 2022-08-24 DIAGNOSIS — M5136 Other intervertebral disc degeneration, lumbar region: Secondary | ICD-10-CM | POA: Diagnosis not present

## 2022-08-24 DIAGNOSIS — Z79891 Long term (current) use of opiate analgesic: Secondary | ICD-10-CM | POA: Diagnosis not present

## 2022-08-24 DIAGNOSIS — M47816 Spondylosis without myelopathy or radiculopathy, lumbar region: Secondary | ICD-10-CM | POA: Diagnosis not present

## 2022-08-24 DIAGNOSIS — G894 Chronic pain syndrome: Secondary | ICD-10-CM | POA: Diagnosis not present

## 2022-08-24 DIAGNOSIS — M549 Dorsalgia, unspecified: Secondary | ICD-10-CM | POA: Diagnosis not present

## 2022-08-24 DIAGNOSIS — M25552 Pain in left hip: Secondary | ICD-10-CM | POA: Diagnosis not present

## 2022-08-24 DIAGNOSIS — I739 Peripheral vascular disease, unspecified: Secondary | ICD-10-CM

## 2022-08-24 DIAGNOSIS — I70222 Atherosclerosis of native arteries of extremities with rest pain, left leg: Secondary | ICD-10-CM

## 2022-08-24 DIAGNOSIS — Z1389 Encounter for screening for other disorder: Secondary | ICD-10-CM | POA: Diagnosis not present

## 2022-08-24 DIAGNOSIS — M5137 Other intervertebral disc degeneration, lumbosacral region: Secondary | ICD-10-CM | POA: Diagnosis not present

## 2022-08-24 MED ORDER — HYDROCODONE-ACETAMINOPHEN 10-325 MG PO TABS
1.0000 | ORAL_TABLET | Freq: Three times a day (TID) | ORAL | 0 refills | Status: DC | PRN
  Filled 2022-08-24 – 2022-09-20 (×2): qty 90, 30d supply, fill #0

## 2022-08-25 ENCOUNTER — Ambulatory Visit (INDEPENDENT_AMBULATORY_CARE_PROVIDER_SITE_OTHER): Payer: PPO

## 2022-08-25 DIAGNOSIS — I4430 Unspecified atrioventricular block: Secondary | ICD-10-CM

## 2022-08-25 LAB — CUP PACEART REMOTE DEVICE CHECK
Battery Remaining Longevity: 143 mo
Battery Voltage: 3.04 V
Brady Statistic AP VP Percent: 1.34 %
Brady Statistic AP VS Percent: 0 %
Brady Statistic AS VP Percent: 98.53 %
Brady Statistic AS VS Percent: 0.12 %
Brady Statistic RA Percent Paced: 1.36 %
Brady Statistic RV Percent Paced: 99.88 %
Date Time Interrogation Session: 20240418211949
Implantable Lead Connection Status: 753985
Implantable Lead Connection Status: 753985
Implantable Lead Implant Date: 20091120
Implantable Lead Implant Date: 20091120
Implantable Lead Location: 753859
Implantable Lead Location: 753860
Implantable Lead Model: 5076
Implantable Lead Model: 5076
Implantable Pulse Generator Implant Date: 20230118
Lead Channel Impedance Value: 304 Ohm
Lead Channel Impedance Value: 361 Ohm
Lead Channel Impedance Value: 722 Ohm
Lead Channel Impedance Value: 779 Ohm
Lead Channel Pacing Threshold Amplitude: 0.75 V
Lead Channel Pacing Threshold Amplitude: 0.75 V
Lead Channel Pacing Threshold Pulse Width: 0.4 ms
Lead Channel Pacing Threshold Pulse Width: 0.4 ms
Lead Channel Sensing Intrinsic Amplitude: 2 mV
Lead Channel Sensing Intrinsic Amplitude: 2 mV
Lead Channel Sensing Intrinsic Amplitude: 6.75 mV
Lead Channel Setting Pacing Amplitude: 1.5 V
Lead Channel Setting Pacing Amplitude: 2 V
Lead Channel Setting Pacing Pulse Width: 0.4 ms
Lead Channel Setting Sensing Sensitivity: 0.9 mV
Zone Setting Status: 755011
Zone Setting Status: 755011

## 2022-08-29 ENCOUNTER — Ambulatory Visit (INDEPENDENT_AMBULATORY_CARE_PROVIDER_SITE_OTHER): Payer: PPO | Admitting: Podiatry

## 2022-08-29 DIAGNOSIS — B351 Tinea unguium: Secondary | ICD-10-CM

## 2022-08-29 DIAGNOSIS — M79674 Pain in right toe(s): Secondary | ICD-10-CM | POA: Diagnosis not present

## 2022-08-29 DIAGNOSIS — M79675 Pain in left toe(s): Secondary | ICD-10-CM

## 2022-08-29 DIAGNOSIS — I96 Gangrene, not elsewhere classified: Secondary | ICD-10-CM

## 2022-08-29 DIAGNOSIS — I739 Peripheral vascular disease, unspecified: Secondary | ICD-10-CM

## 2022-08-29 NOTE — Progress Notes (Signed)
  Subjective:  Patient ID: Andrew Wiggins, male    DOB: 1955-08-07,  MRN: [id number]  Chief Complaint  Patient presents with   diabetic foot care    67 y.o. male presents with the above complaint. History confirmed with patient. Patient presenting with pain related to dystrophic thickened elongated nails. Patient is unable to trim own nails related to nail dystrophy and/or mobility issues. 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 Denies any redness swelling or drainage of the left 4th toe stump. Denies N/V/F/C.  Objective:  Physical Exam: warm, good capillary refill nail exam onychomycosis of the toenails, onycholysis, and dystrophic nails DP pulses palpable, PT pulses palpable, and protective sensation absent Left Foot:  Pain with palpation of nails due to elongation and dystrophic growth.  Amputation site of the left fourth toe well-healed no drainage open wound erythema edema or other issue there is mild hyperkeratotic tissue at the nail which was debrided with a nail. Right Foot: Pain with palpation of nails due to elongation and dystrophic growth.   Assessment:   1. Pain due to onychomycosis of toenails of both feet   2. Gangrene of toe of left foot   3. PAD (peripheral artery disease)        Plan:  Patient was evaluated and treated and all questions answered.  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 -Continue to use some kind of a spacer device in between the fourth and fifth toe to prevent irritation between the 2. -Continue to monitor for any signs of infection or opening of the wound.  #Onychomycosis with pain  -Nails palliatively  debrided as below. -Educated on self-care  Procedure: Nail Debridement Rationale: Pain Type of Debridement: manual, sharp debridement. Instrumentation: Nail nipper, rotary burr. Number of Nails: 10  Return if symptoms worsen or fail to improve, for [facility].         Corinna Gab, DPM [facility] / [facility]

## 2022-08-31 ENCOUNTER — Other Ambulatory Visit: Payer: Self-pay

## 2022-08-31 ENCOUNTER — Telehealth: Payer: Self-pay

## 2022-08-31 DIAGNOSIS — E0840 Diabetes mellitus due to underlying condition with diabetic neuropathy, unspecified: Secondary | ICD-10-CM

## 2022-08-31 MED ORDER — GABAPENTIN 300 MG PO CAPS: 600.0000 mg | ORAL_CAPSULE | Freq: Three times a day (TID) | ORAL | 2 refills | Status: DC

## 2022-08-31 NOTE — Progress Notes (Signed)
Care Management & Coordination Services Pharmacy Team  Reason for Encounter: Medication coordination and delivery  Contacted patient to discuss medications and coordinate delivery from [facility].  Spoke with patient on 08/31/2022   Cycle dispensing form sent to Andrew Wiggins for review.   Last adherence delivery date:08/11/22      Patient is due for next adherence delivery on: 09/12/22  This delivery to include: Adherence Packaging  30 Days  Aspirin  1 at B Atorvastatin  1 at EM Metformin  1 at B 1 at EM Clopidogrel  1 at B Gabapentin  2 at B, 2 at L, 2 at EM Valsartan-HCTZ 160-12.5 1 B       Iron  1 B   Omega 3  1 B Glimepiride  1 BT  Patient declined the following medications this month: Albuterol Inhaler-LF 08/07/22  25ds Rybelsus - Getting PAP. Pt is out.  Pt never did submit his 2024 re-enrollment application. I advised him to pick that up from Dr. Sedalia Muta office asap to continue processing. Pt prefers this mailed to him instead. I will send a task to get this out to him. Pt aware. Messaged Dr. Sedalia Muta for samples in the office for him to take.  Hydrocodone 10-325mg - Picked up at 08/22/22 30ds at [facility]  Refills requested from providers include: Gabapentin    Confirmed delivery date of 09/12/22, advised patient that pharmacy will contact them the morning of delivery.  Any concerns about your medications? No  How often do you forget or accidentally miss a dose? Never  Do you use a pillbox? No  Is patient in packaging Yes  If yes  What is the date on your next pill pack? 09/30/22  Any concerns or issues with your packaging? No concerns    Recent blood pressure readings are as follows: Pt did not have any BP readings. He stated he will have these next call. Pt declined to take this while I was on the phone. Denied any hypertension symptoms.   Recent blood glucose readings are as follows: 08/31/22 114, 08/30/22 118, 08/29/22  122   Chart review: Recent office visits:  None  Recent consult visits:  08/29/22 Carlena Hurl DPM. Seen for DM foot care. No med changes.   Hospital visits:  None  Medications: Outpatient Encounter Medications as of 08/31/2022  Medication Sig   albuterol (VENTOLIN HFA) 108 (90 Base) MCG/ACT inhaler Inhale 2 puffs into the lungs every 6 (six) hours as needed for wheezing or shortness of breath.   aspirin EC 81 MG tablet Take 81 mg by mouth daily. Swallow whole.   atorvastatin (LIPITOR) 20 MG tablet TAKE ONE TABLET BY MOUTH EVERY EVENING   clopidogrel (PLAVIX) 75 MG tablet TAKE ONE TABLET BY MOUTH EVERY MORNING   doxycycline (VIBRAMYCIN) 100 MG capsule Take 1 capsule (100 mg total) by mouth 2 (two) times daily.   gabapentin (NEURONTIN) 300 MG capsule Take 2 capsules (600 mg total) by mouth 3 (three) times daily.   glimepiride (AMARYL) 4 MG tablet Take 1 tablet (4 mg total) by mouth daily.   HYDROcodone-acetaminophen (NORCO) 10-325 MG tablet Take 1 tablet by mouth 3 (three) times daily as needed for back pain.   HYDROcodone-acetaminophen (NORCO) 10-325 MG tablet Take 1 tablet by mouth 3 (three) times daily as needed for back pain *09/20/22*   Iron, Ferrous Sulfate, 325 (65 Fe) MG TABS Take 325 mg by mouth daily.   metFORMIN (GLUCOPHAGE) 1000 MG tablet Take 1 tablet (1,000 mg total) by  mouth 2 (two) times daily with a meal.   Omega-3 Fatty Acids (FISH OIL) 1000 MG CAPS Take 2 capsules (2,000 mg total) by mouth 2 (two) times daily.   Semaglutide (RYBELSUS) 7 MG TABS Take 1 tablet (7 mg total) by mouth daily.   valsartan-hydrochlorothiazide (DIOVAN-HCT) 160-12.5 MG tablet Take 1 tablet by mouth daily.   No facility-administered encounter medications on file as of 08/31/2022.   BP Readings from Last 3 Encounters:  07/28/22 (!) 123/58  07/27/22 (!) 190/70  07/14/22 138/74    Pulse Readings from Last 3 Encounters:  07/28/22 92  07/27/22 64  07/14/22 91    Lab Results   Component Value Date/Time   HGBA1C 5.5 06/21/2022 09:16 AM   HGBA1C 8.6 (H) 12/23/2021 09:28 AM   Lab Results  Component Value Date   CREATININE 0.90 07/28/2022   BUN 28 (H) 07/28/2022   GFR 83.08 12/20/2017   GFRNONAA 90 11/18/2019   GFRAA 104 11/18/2019   NA 139 07/28/2022   K 4.7 07/28/2022   CALCIUM 9.6 06/21/2022   CO2 22 06/21/2022     Roxana Hires, CMA Clinical Pharmacist Assistant  [phone]

## 2022-09-01 NOTE — Telephone Encounter (Signed)
Compliant on meds 

## 2022-09-04 ENCOUNTER — Ambulatory Visit (INDEPENDENT_AMBULATORY_CARE_PROVIDER_SITE_OTHER)
Admission: RE | Admit: 2022-09-04 | Discharge: 2022-09-04 | Disposition: A | Discharge status: Home / Self Care | Payer: PPO | Source: Ambulatory Visit | Attending: Surgery | Admitting: Surgery

## 2022-09-04 ENCOUNTER — Other Ambulatory Visit: Payer: Self-pay

## 2022-09-04 ENCOUNTER — Ambulatory Visit (INDEPENDENT_AMBULATORY_CARE_PROVIDER_SITE_OTHER): Payer: PPO | Admitting: Surgery

## 2022-09-04 ENCOUNTER — Ambulatory Visit (HOSPITAL_COMMUNITY)
Admission: RE | Admit: 2022-09-04 | Discharge: 2022-09-04 | Disposition: A | Discharge status: Home / Self Care | Payer: PPO | Source: Ambulatory Visit | Attending: Surgery | Admitting: Surgery

## 2022-09-04 ENCOUNTER — Encounter: Payer: Self-pay | Admitting: Surgery

## 2022-09-04 VITALS — BP 180/67 | HR 88 | Temp 98.3°F | Resp 20 | Ht 69.0 in | Wt 149.0 lb

## 2022-09-04 DIAGNOSIS — I70249 Atherosclerosis of native arteries of left leg with ulceration of unspecified site: Secondary | ICD-10-CM | POA: Diagnosis not present

## 2022-09-04 DIAGNOSIS — I739 Peripheral vascular disease, unspecified: Secondary | ICD-10-CM

## 2022-09-04 DIAGNOSIS — I70222 Atherosclerosis of native arteries of extremities with rest pain, left leg: Secondary | ICD-10-CM | POA: Diagnosis not present

## 2022-09-04 DIAGNOSIS — E1140 Type 2 diabetes mellitus with diabetic neuropathy, unspecified: Secondary | ICD-10-CM

## 2022-09-04 LAB — VAS US ABI WITH/WO TBI
Left ABI: 0.59
Right ABI: 0.8

## 2022-09-04 MED ORDER — ALBUTEROL SULFATE HFA 108 (90 BASE) MCG/ACT IN AERS
2.0000 | INHALATION_SPRAY | Freq: Four times a day (QID) | RESPIRATORY_TRACT | 2 refills | Status: DC | PRN
Start: 2022-09-04 — End: 2022-11-27

## 2022-09-04 NOTE — Progress Notes (Signed)
Vascular and Vein Specialist of [facility]  Patient name: Andrew Wiggins MRN: [id number] DOB: Aug 01, 1955 Sex: male   REASON FOR VISIT:    Follow up  HISOTRY OF PRESENT ILLNESS:    Andrew Wiggins is a 67 y.o. 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  He was seen in [place] clinic on 07/27/2022.  At that time he had developed a new wound between his fourth and fifth left toes.  He underwent angiogram by Dr. Lenell Antu on 07/28/2022.  He had approximately a 95%  restenosis.  This was treated with a 5 mm Zilver stent  Patient is a current smoker.  He has COPD.  He takes a statin for hypercholesterolemia.  He is medically managed for hypertension.  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 ~  Jan11:  Last seen by Cards & pacer reprogrammed...     Cardiac pacemaker in situ    Carotid artery disease (HCC) 07/27/2010   R/O PERIPHERAL VASCULAR DISEASE (ICD-443.9) - on ASA 81mg /d... exam 11/09 shows gr 1-2 sys murmur LSB, but also has prominent bruit to base of right side of his neck w/ right > left Carotid Bruit... ~  CDopplers 11/09 showed severe plaque right ICA, & min plaque on left... no signif ICA stenoses per report. ~  f/u CDopplers 12/10 showed mild irreg plaque bilat in bulbs & intimal thickening in righ   Complete AV block (HCC)    history of   COPD (chronic obstructive  pulmonary disease) with chronic bronchitis 03/26/2008   CHRONIC OBSTRUCTIVE PULMONARY DISEASE (ICD-496) - hx recurrent bronchitic episodes in the past... smokes 1/2 - 1ppd x yrs... ~  PFT's 11/08 showed FVC= 3.85 (79%), FEV1= 2.19 (55%), %1sec= 57, mid-flows= 32% predicted:  all c/w mod airflow obstruction...  ~  CXR 12/10 showed left subclav pacer, mild hyperinflation, NAD...     Depression    Dermatitis, contact    due to poison ivy   Diabetes mellitus (HCC) 02/04/2020   Diabetes mellitus with neuropathy (HCC) 12/20/2016   Dyslipidemia 03/06/2012   Essential hypertension 04/10/2007    HYPERTENSION (ICD-401.9) - prev on Micardis 80mg /d then switched to Losartan 100mg /d, but he lost his job in 2011& couldn't afford Rx;  He called [place] 2/12 w/ this news & we phoned in Lisinopril 20mg /d... ~  2DEcho 11/09 showed sl incr LVwall thickness, norm LVF w/ EF= 55-60%, no regional wall motion abn, mild DD, mild MR... ~  Myoview 12/09 showed LBBB, no perfusion defects, abn septal motion & EF   GERD 03/26/2008    GERD (ICD-530.81) - prev mild GERD symptoms that improved after cholecystectomy on 2000... uses OTC PPI as needed... ~  he is due for routine screening colonoscopy...       Mixed hyperlipidemia 06/19/2019   PVD (peripheral vascular disease) (HCC)    Sciatica 11/18/2019  Tobacco use 02/04/2020   Tobacco use disorder      FAMILY HISTORY:   Family History  Problem Relation Age of Onset   Heart disease Brother    Leukemia Brother     SOCIAL HISTORY:   Social History   Tobacco Use   Smoking status: Every Day    Packs/day: 0.50    Years: 25.00    Additional pack years: 0.00    Total pack years: 12.50    Types: Cigarettes    Passive exposure: Never   Smokeless tobacco: Never  Substance Use Topics   Alcohol use: Not Currently    Alcohol/week: 0.0 standard drinks of alcohol     ALLERGIES:   No Known Allergies   CURRENT MEDICATIONS:   Current Outpatient Medications   Medication Sig Dispense Refill   albuterol (VENTOLIN HFA) 108 (90 Base) MCG/ACT inhaler Inhale 2 puffs into the lungs every 6 (six) hours as needed for wheezing or shortness of breath. 8 g 2   aspirin EC 81 MG tablet Take 81 mg by mouth daily. Swallow whole.     atorvastatin (LIPITOR) 20 MG tablet TAKE ONE TABLET BY MOUTH EVERY EVENING 90 tablet 2   clopidogrel (PLAVIX) 75 MG tablet TAKE ONE TABLET BY MOUTH EVERY MORNING 90 tablet 2   doxycycline (VIBRAMYCIN) 100 MG capsule Take 1 capsule (100 mg total) by mouth 2 (two) times daily. 14 capsule 0   gabapentin (NEURONTIN) 300 MG capsule Take 2 capsules (600 mg total) by mouth 3 (three) times daily. 90 capsule 2   glimepiride (AMARYL) 4 MG tablet Take 1 tablet (4 mg total) by mouth daily. 30 tablet 0   HYDROcodone-acetaminophen (NORCO) 10-325 MG tablet Take 1 tablet by mouth 3 (three) times daily as needed for back pain. 90 tablet 0   HYDROcodone-acetaminophen (NORCO) 10-325 MG tablet Take 1 tablet by mouth 3 (three) times daily as needed for back pain *09/20/22* 90 tablet 0   Iron, Ferrous Sulfate, 325 (65 Fe) MG TABS Take 325 mg by mouth daily. 90 tablet 0   metFORMIN (GLUCOPHAGE) 1000 MG tablet Take 1 tablet (1,000 mg total) by mouth 2 (two) times daily with a meal. 60 tablet 0   Omega-3 Fatty Acids (FISH OIL) 1000 MG CAPS Take 2 capsules (2,000 mg total) by mouth 2 (two) times daily. 180 capsule 12   Semaglutide (RYBELSUS) 7 MG TABS Take 1 tablet (7 mg total) by mouth daily. 90 tablet 1   valsartan-hydrochlorothiazide (DIOVAN-HCT) 160-12.5 MG tablet Take 1 tablet by mouth daily. 90 tablet 0   No current facility-administered medications for this visit.    REVIEW OF SYSTEMS:   [X]  denotes positive finding, [ ]  denotes negative finding Cardiac  Comments:  Chest pain or chest pressure:    Shortness of breath upon exertion:    Short of breath when lying flat:    Irregular heart rhythm:        Vascular    Pain in calf, thigh, or hip brought  on by ambulation:    Pain in feet at night that wakes you up from your sleep:     Blood clot in your veins:    Leg swelling:         Pulmonary    Oxygen at home:    Productive cough:     Wheezing:         Neurologic    Sudden weakness in arms or legs:     Sudden numbness in arms or legs:  Sudden onset of difficulty speaking or slurred speech:    Temporary loss of vision in one eye:     Problems with dizziness:         Gastrointestinal    Blood in stool:     Vomited blood:         Genitourinary    Burning when urinating:     Blood in urine:        Psychiatric    Major depression:         Hematologic    Bleeding problems:    Problems with blood clotting too easily:        Skin    Rashes or ulcers: x       Constitutional    Fever or chills:      PHYSICAL EXAM:   Vitals:   09/04/22 1421  BP: (!) 180/67  Pulse: 88  Resp: 20  Temp: 98.3 F (36.8 C)  SpO2: 95%  Weight: 149 lb (67.6 kg)  Height: 5\' 9"  (1.753 m)    GENERAL: The patient is a well-nourished male, in no acute distress. The vital signs are documented above. CARDIAC: There is a regular rate and rhythm.  VASCULAR: non-palpable pedal pulse PULMONARY: Non-labored respirations MUSCULOSKELETAL: There are no major deformities or cyanosis. NEUROLOGIC: No focal weakness or paresthesias are detected. SKIN: See photo below PSYCHIATRIC: The patient has a normal affect.  STUDIES:   I have reviewed the following vascular lab studies: +-------+-----------+-----------+------------+------------+  ABI/TBIToday's ABIToday's TBIPrevious ABIPrevious TBI  +-------+-----------+-----------+------------+------------+  Right 0.80       0.35       0.82        0.63          +-------+-----------+-----------+------------+------------+  Left  0.59       0.29       0.39        0.00          +-------+-----------+-----------+------------+------------+  Right toe pressure: 64 Left toe pressure:  53  +-----------+--------+-----+---------------+----------+--------+  LEFT      PSV cm/sRatioStenosis       Waveform  Comments  +-----------+--------+-----+---------------+----------+--------+  CIA Prox   375          50-74% stenosis                    +-----------+--------+-----+---------------+----------+--------+  CIA Distal 551          75-99% stenosismonophasic          +-----------+--------+-----+---------------+----------+--------+  EIA Prox   191                         monophasic          +-----------+--------+-----+---------------+----------+--------+  EIA Mid    166                         monophasic          +-----------+--------+-----+---------------+----------+--------+  EIA Distal 189                         monophasic          +-----------+--------+-----+---------------+----------+--------+  CFA Distal 190                         monophasic          +-----------+--------+-----+---------------+----------+--------+  DFA       117  monophasic          +-----------+--------+-----+---------------+----------+--------+  SFA Prox   234          50-74% stenosis                    +-----------+--------+-----+---------------+----------+--------+  POP Prox   116                         monophasic          +-----------+--------+-----+---------------+----------+--------+  POP Distal 95                          monophasic          +-----------+--------+-----+---------------+----------+--------+  TP Trunk   72                          monophasic          +-----------+--------+-----+---------------+----------+--------+  ATA Distal 47                          monophasic          +-----------+--------+-----+---------------+----------+--------+  PTA Distal 33                          monophasic          +-----------+--------+-----+---------------+----------+--------+   PERO Distal30                          monophasic          +-----------+--------+-----+---------------+----------+--------+     Left Stent(s):  +---------------+--------+---------------+----------+--------+  SFA           PSV cm/sStenosis       Waveform  Comments  +---------------+--------+---------------+----------+--------+  Prox to Stent  101                    monophasic          +---------------+--------+---------------+----------+--------+  Proximal Stent 78                     monophasic          +---------------+--------+---------------+----------+--------+  Mid Stent      163     50-99% stenosismonophasic          +---------------+--------+---------------+----------+--------+  Distal Stent   88                     monophasic          +---------------+--------+---------------+----------+--------+  Distal to Stent75                     monophasic          +---------------+--------+---------------+----------+--------+              Summary:  Left: 75-99% stenosis noted in the distal common iliac artery segment.  50-74% stenosis noted in the proximal superficial femoral artery. 50-99%  stenosis is noted within the mid SFA stent.    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  He underwent successful stenting with a 5 mm Zilver PTX.  His wounds have healed and he no longer has any infection.  He is not having rest pain or significant claudication symptoms.  I discussed with him that he has very small arteries and has had limited durability from endovascular procedures.  Therefore, I am going to be reluctant to chase velocity elevations.  I would consider reintervention if he develops a new wound or has rest pain.  I also talked about the possibility of surgical revascularization if he has adequate vein.  I am and have  him come back in 3 months for surveillance imaging.    Charlena Cross, MD, FACS Vascular and Vein Specialists of [facility] [phone] Pager [phone]

## 2022-09-11 ENCOUNTER — Other Ambulatory Visit: Payer: Self-pay

## 2022-09-11 DIAGNOSIS — I70222 Atherosclerosis of native arteries of extremities with rest pain, left leg: Secondary | ICD-10-CM

## 2022-09-11 DIAGNOSIS — I70249 Atherosclerosis of native arteries of left leg with ulceration of unspecified site: Secondary | ICD-10-CM

## 2022-09-11 DIAGNOSIS — I739 Peripheral vascular disease, unspecified: Secondary | ICD-10-CM

## 2022-09-19 ENCOUNTER — Telehealth: Payer: Self-pay | Admitting: Oncology

## 2022-09-19 NOTE — Telephone Encounter (Signed)
Left VM for patient letting him know we moved his appointment from 5/23 to 6/10 due  to Dr. Melvyn Neth being out of the office. CB

## 2022-09-20 ENCOUNTER — Other Ambulatory Visit (HOSPITAL_COMMUNITY): Payer: Self-pay

## 2022-09-22 NOTE — Progress Notes (Signed)
Remote pacemaker transmission.   

## 2022-09-25 ENCOUNTER — Other Ambulatory Visit (HOSPITAL_COMMUNITY): Payer: Self-pay

## 2022-09-25 DIAGNOSIS — Z79891 Long term (current) use of opiate analgesic: Secondary | ICD-10-CM | POA: Diagnosis not present

## 2022-09-25 DIAGNOSIS — M25552 Pain in left hip: Secondary | ICD-10-CM | POA: Diagnosis not present

## 2022-09-25 DIAGNOSIS — M5136 Other intervertebral disc degeneration, lumbar region: Secondary | ICD-10-CM | POA: Diagnosis not present

## 2022-09-25 DIAGNOSIS — G894 Chronic pain syndrome: Secondary | ICD-10-CM | POA: Diagnosis not present

## 2022-09-25 DIAGNOSIS — M169 Osteoarthritis of hip, unspecified: Secondary | ICD-10-CM | POA: Diagnosis not present

## 2022-09-25 DIAGNOSIS — M549 Dorsalgia, unspecified: Secondary | ICD-10-CM | POA: Diagnosis not present

## 2022-09-25 DIAGNOSIS — Z1389 Encounter for screening for other disorder: Secondary | ICD-10-CM | POA: Diagnosis not present

## 2022-09-25 DIAGNOSIS — M48061 Spinal stenosis, lumbar region without neurogenic claudication: Secondary | ICD-10-CM | POA: Diagnosis not present

## 2022-09-25 DIAGNOSIS — M47816 Spondylosis without myelopathy or radiculopathy, lumbar region: Secondary | ICD-10-CM | POA: Diagnosis not present

## 2022-09-25 DIAGNOSIS — M47818 Spondylosis without myelopathy or radiculopathy, sacral and sacrococcygeal region: Secondary | ICD-10-CM | POA: Diagnosis not present

## 2022-09-25 DIAGNOSIS — M5137 Other intervertebral disc degeneration, lumbosacral region: Secondary | ICD-10-CM | POA: Diagnosis not present

## 2022-09-25 MED ORDER — HYDROCODONE-ACETAMINOPHEN 10-325 MG PO TABS
1.0000 | ORAL_TABLET | Freq: Three times a day (TID) | ORAL | 0 refills | Status: DC
  Filled 2022-10-19: qty 90, 30d supply, fill #0

## 2022-09-28 ENCOUNTER — Other Ambulatory Visit: Payer: Self-pay | Admitting: Family Medicine

## 2022-09-28 ENCOUNTER — Other Ambulatory Visit: Payer: PPO

## 2022-09-28 ENCOUNTER — Ambulatory Visit: Payer: PPO | Admitting: Oncology

## 2022-09-28 DIAGNOSIS — E0840 Diabetes mellitus due to underlying condition with diabetic neuropathy, unspecified: Secondary | ICD-10-CM

## 2022-09-29 ENCOUNTER — Telehealth: Payer: Self-pay

## 2022-09-29 NOTE — Progress Notes (Signed)
Care Management & Coordination Services Pharmacy Team  Reason for Encounter: Medication coordination and delivery  Contacted patient to discuss medications and coordinate delivery from [facility]. {US HC Outreach:28874} Cycle dispensing form sent to *** for review.   Last adherence delivery date:09/12/22      Patient is due for next adherence delivery on: 10/11/22  This delivery to include: Adherence Packaging  30 Days  Aspirin 81mg  1 at B Atorvastatin 20mg  1 at EM Metformin 1000mg  1 at B 1 at EM Clopidogrel 75mg  1 at B Gabapentin 300mg  2 at B, 2 at L, 2 at EM Valsartan-HCTZ 160-12.5 1 B       Iron 325mg  1 B   Omega 3 1000mg  1 B Glimepiride 4mg  1 BT Rybelsus 7mg  1 B  Patient declined the following medications this month: Albuterol Inhaler-LF 08/07/22  25ds Rybelsus 7mg - Getting PAP. Pt is out.  Pt never did submit his 2024 re-enrollment application. I advised him to pick that up from Dr. Sedalia Muta office asap to continue processing. Pt prefers this mailed to him instead. I will send a task to get this out to him. Pt aware. Messaged Dr. Sedalia Muta for samples in the office for him to take.  Hydrocodone 10-325mg - Picked up at 09/20/22 30ds at [facility]  Refills requested from providers include: Gabapentin 300mg    {Delivery ZOXW:96045}   Any concerns about your medications? {yes/no:20286}  How often do you forget or accidentally miss a dose? {Missed doses:25554}  Do you use a pillbox? No  Is patient in packaging Yes  If yes  What is the date on your next pill pack?  Any concerns or issues with your packaging?   Recent blood pressure readings are as follows:***  Recent blood glucose readings are as follows:***   Chart review: Recent office visits:  None  Recent consult visits:  09/04/22 Coral Else MD. (Vascular &Vein). Seen for routine visit. No med changes.   Hospital visits:  None  Medications: Outpatient Encounter Medications as of 09/29/2022  Medication Sig    albuterol (VENTOLIN HFA) 108 (90 Base) MCG/ACT inhaler Inhale 2 puffs into the lungs every 6 (six) hours as needed for wheezing or shortness of breath.   aspirin EC 81 MG tablet Take 81 mg by mouth daily. Swallow whole.   atorvastatin (LIPITOR) 20 MG tablet TAKE ONE TABLET BY MOUTH EVERY EVENING   clopidogrel (PLAVIX) 75 MG tablet TAKE ONE TABLET BY MOUTH EVERY MORNING   doxycycline (VIBRAMYCIN) 100 MG capsule Take 1 capsule (100 mg total) by mouth 2 (two) times daily.   gabapentin (NEURONTIN) 300 MG capsule TAKE TWO CAPSULES BY MOUTH THREE TIMES DAILY   glimepiride (AMARYL) 4 MG tablet Take 1 tablet (4 mg total) by mouth daily.   HYDROcodone-acetaminophen (NORCO) 10-325 MG tablet Take 1 tablet by mouth 3 (three) times daily as needed for back pain.   HYDROcodone-acetaminophen (NORCO) 10-325 MG tablet Take 1 tablet by mouth 3 (three) times daily as needed for back pain *09/20/22*   [START ON 10/19/2022] HYDROcodone-acetaminophen (NORCO) 10-325 MG tablet Take 1 tablet by mouth 3 (three) times daily as needed for back pain   Iron, Ferrous Sulfate, 325 (65 Fe) MG TABS Take 325 mg by mouth daily.   metFORMIN (GLUCOPHAGE) 1000 MG tablet Take 1 tablet (1,000 mg total) by mouth 2 (two) times daily with a meal.   Omega-3 Fatty Acids (FISH OIL) 1000 MG CAPS Take 2 capsules (2,000 mg total) by mouth 2 (two) times daily.   Semaglutide (RYBELSUS)  7 MG TABS Take 1 tablet (7 mg total) by mouth daily.   valsartan-hydrochlorothiazide (DIOVAN-HCT) 160-12.5 MG tablet Take 1 tablet by mouth daily.   No facility-administered encounter medications on file as of 09/29/2022.   BP Readings from Last 3 Encounters:  09/04/22 (!) 180/67  07/28/22 (!) 123/58  07/27/22 (!) 190/70    Pulse Readings from Last 3 Encounters:  09/04/22 88  07/28/22 92  07/27/22 64    Lab Results  Component Value Date/Time   HGBA1C 5.5 06/21/2022 09:16 AM   HGBA1C 8.6 (H) 12/23/2021 09:28 AM   Lab Results  Component Value Date    CREATININE 0.90 07/28/2022   BUN 28 (H) 07/28/2022   GFR 83.08 12/20/2017   GFRNONAA 90 11/18/2019   GFRAA 104 11/18/2019   NA 139 07/28/2022   K 4.7 07/28/2022   CALCIUM 9.6 06/21/2022   CO2 22 06/21/2022     Roxana Hires, CMA Clinical Pharmacist Assistant  [phone]

## 2022-10-04 ENCOUNTER — Other Ambulatory Visit: Payer: Self-pay

## 2022-10-04 DIAGNOSIS — E0840 Diabetes mellitus due to underlying condition with diabetic neuropathy, unspecified: Secondary | ICD-10-CM

## 2022-10-04 MED ORDER — GABAPENTIN 300 MG PO CAPS
600.0000 mg | ORAL_CAPSULE | Freq: Three times a day (TID) | ORAL | 1 refills | Status: DC
Start: 2022-10-04 — End: 2022-11-24

## 2022-10-09 ENCOUNTER — Other Ambulatory Visit: Payer: Self-pay

## 2022-10-09 DIAGNOSIS — E0840 Diabetes mellitus due to underlying condition with diabetic neuropathy, unspecified: Secondary | ICD-10-CM

## 2022-10-09 MED ORDER — GLIMEPIRIDE 4 MG PO TABS
4.0000 mg | ORAL_TABLET | Freq: Every day | ORAL | 0 refills | Status: DC
Start: 2022-10-09 — End: 2022-12-06

## 2022-10-10 ENCOUNTER — Other Ambulatory Visit: Payer: Self-pay | Admitting: Family Medicine

## 2022-10-10 ENCOUNTER — Telehealth: Payer: Self-pay

## 2022-10-10 DIAGNOSIS — E0840 Diabetes mellitus due to underlying condition with diabetic neuropathy, unspecified: Secondary | ICD-10-CM

## 2022-10-10 NOTE — Telephone Encounter (Signed)
Error

## 2022-10-15 ENCOUNTER — Other Ambulatory Visit: Payer: Self-pay | Admitting: Oncology

## 2022-10-15 NOTE — Progress Notes (Unsigned)
[facility]  [address] [place],  [place]  [address] [phone]  Clinic Day:  10/16/2022  Referring physician: Lurline Del, FNP   HISTORY OF PRESENT ILLNESS:  The patient is a 67 y.o. male  who I recently began seeing for iron deficiency anemia.  He comes in today to reassess his iron and hemoglobin levels after recently receiving IV iron.  Of note, the patient has felt better since his IV iron was given.  He continues to deny having any overt forms of blood loss to explain it.  Of note, he has yet to see a GI doctor to undergo his first ever colonoscopy.   PHYSICAL EXAM:  Blood pressure (!) 174/76, pulse 90, temperature 99.1 F (37.3 C), resp. rate 16, height 5\' 9"  (1.753 m), weight 149 lb 4.8 oz (67.7 kg), SpO2 97 %. Wt Readings from Last 3 Encounters:  10/16/22 149 lb 4.8 oz (67.7 kg)  09/04/22 149 lb (67.6 kg)  07/28/22 147 lb (66.7 kg)   Body mass index is 22.05 kg/m. Performance status (ECOG): 0 - Asymptomatic Physical Exam Constitutional:      Appearance: Normal appearance. He is not ill-appearing.  HENT:     Mouth/Throat:     Mouth: Mucous membranes are moist.     Pharynx: Oropharynx is clear. No oropharyngeal exudate or posterior oropharyngeal erythema.  Cardiovascular:     Rate and Rhythm: Normal rate and regular rhythm.     Heart sounds: No murmur heard.    No friction rub. No gallop.  Pulmonary:     Effort: Pulmonary effort is normal. No respiratory distress.     Breath sounds: Normal breath sounds. No wheezing, rhonchi or rales.  Abdominal:     General: Bowel sounds are normal. There is no distension.     Palpations: Abdomen is soft. There is no mass.     Tenderness: There is no abdominal tenderness.  Musculoskeletal:        General: No swelling.     Right lower leg: No edema.     Left lower leg: No edema.  Lymphadenopathy:     Cervical: No cervical adenopathy.     Upper Body:     Right upper body: No  supraclavicular or axillary adenopathy.     Left upper body: No supraclavicular or axillary adenopathy.     Lower Body: No right inguinal adenopathy. No left inguinal adenopathy.  Skin:    General: Skin is warm.     Coloration: Skin is not jaundiced.     Findings: No lesion or rash.  Neurological:     General: No focal deficit present.     Mental Status: He is alert and oriented to person, place, and time. Mental status is at baseline.  Psychiatric:        Mood and Affect: Mood normal.        Behavior: Behavior normal.        Thought Content: Thought content normal.     LABS:     Latest Reference Range & Units 10/16/22 14:24  Iron 45 - 182 ug/dL 93  UIBC ug/dL 295  TIBC 188 - 416 ug/dL 606  Saturation Ratios 17.9 - 39.5 % 24  Ferritin 24 - 336 ng/mL 31    ASSESSMENT & PLAN:  A 67 y.o. male with iron deficiency anemia.  I am pleased with the improvement in his iron and hemoglobin levels since he recently received his IV iron.  As mentioned previously, he  has never undergone a previous colonoscopy.  Based upon this, I will refer him to GI to have this done within the forthcoming weeks/months.  Otherwise, I will see this patient back in 4 months for repeat clinical assessment.  The patient understands all the plans discussed today and is in agreement with them.   Leyani Gargus Kirby Funk, MD

## 2022-10-16 ENCOUNTER — Other Ambulatory Visit: Payer: Self-pay | Admitting: Oncology

## 2022-10-16 ENCOUNTER — Inpatient Hospital Stay: Payer: PPO | Attending: Oncology

## 2022-10-16 ENCOUNTER — Inpatient Hospital Stay (INDEPENDENT_AMBULATORY_CARE_PROVIDER_SITE_OTHER): Payer: PPO | Admitting: Oncology

## 2022-10-16 VITALS — BP 174/76 | HR 90 | Temp 99.1°F | Resp 16 | Ht 69.0 in | Wt 149.3 lb

## 2022-10-16 DIAGNOSIS — D509 Iron deficiency anemia, unspecified: Secondary | ICD-10-CM | POA: Insufficient documentation

## 2022-10-16 DIAGNOSIS — D500 Iron deficiency anemia secondary to blood loss (chronic): Secondary | ICD-10-CM | POA: Diagnosis not present

## 2022-10-16 DIAGNOSIS — D649 Anemia, unspecified: Secondary | ICD-10-CM | POA: Diagnosis not present

## 2022-10-16 LAB — IRON AND TIBC
Iron: 93 ug/dL (ref 45–182)
Saturation Ratios: 24 % (ref 17.9–39.5)
TIBC: 389 ug/dL (ref 250–450)
UIBC: 296 ug/dL

## 2022-10-16 LAB — CBC: RBC: 3.96 (ref 3.87–5.11)

## 2022-10-16 LAB — FERRITIN: Ferritin: 31 ng/mL (ref 24–336)

## 2022-10-16 LAB — CBC AND DIFFERENTIAL
HCT: 38 — AB (ref 41–53)
Hemoglobin: 12.5 — AB (ref 13.5–17.5)
Neutrophils Absolute: 6.53
Platelets: 239 10*3/uL (ref 150–400)
WBC: 9.9

## 2022-10-19 ENCOUNTER — Other Ambulatory Visit (HOSPITAL_COMMUNITY): Payer: Self-pay

## 2022-10-25 ENCOUNTER — Other Ambulatory Visit (HOSPITAL_COMMUNITY): Payer: Self-pay

## 2022-10-25 DIAGNOSIS — M549 Dorsalgia, unspecified: Secondary | ICD-10-CM | POA: Diagnosis not present

## 2022-10-25 DIAGNOSIS — M47818 Spondylosis without myelopathy or radiculopathy, sacral and sacrococcygeal region: Secondary | ICD-10-CM | POA: Diagnosis not present

## 2022-10-25 DIAGNOSIS — M47816 Spondylosis without myelopathy or radiculopathy, lumbar region: Secondary | ICD-10-CM | POA: Diagnosis not present

## 2022-10-25 DIAGNOSIS — Z79891 Long term (current) use of opiate analgesic: Secondary | ICD-10-CM | POA: Diagnosis not present

## 2022-10-25 DIAGNOSIS — M169 Osteoarthritis of hip, unspecified: Secondary | ICD-10-CM | POA: Diagnosis not present

## 2022-10-25 DIAGNOSIS — M48061 Spinal stenosis, lumbar region without neurogenic claudication: Secondary | ICD-10-CM | POA: Diagnosis not present

## 2022-10-25 DIAGNOSIS — Z1389 Encounter for screening for other disorder: Secondary | ICD-10-CM | POA: Diagnosis not present

## 2022-10-25 DIAGNOSIS — M25552 Pain in left hip: Secondary | ICD-10-CM | POA: Diagnosis not present

## 2022-10-25 DIAGNOSIS — M5136 Other intervertebral disc degeneration, lumbar region: Secondary | ICD-10-CM | POA: Diagnosis not present

## 2022-10-25 DIAGNOSIS — M5137 Other intervertebral disc degeneration, lumbosacral region: Secondary | ICD-10-CM | POA: Diagnosis not present

## 2022-10-25 DIAGNOSIS — G894 Chronic pain syndrome: Secondary | ICD-10-CM | POA: Diagnosis not present

## 2022-10-25 MED ORDER — OXYCODONE-ACETAMINOPHEN 7.5-325 MG PO TABS
1.0000 | ORAL_TABLET | Freq: Three times a day (TID) | ORAL | 0 refills | Status: DC
  Filled 2022-10-25 – 2022-11-17 (×2): qty 90, 30d supply, fill #0

## 2022-11-02 ENCOUNTER — Other Ambulatory Visit: Payer: Self-pay | Admitting: Family Medicine

## 2022-11-02 DIAGNOSIS — D500 Iron deficiency anemia secondary to blood loss (chronic): Secondary | ICD-10-CM

## 2022-11-04 ENCOUNTER — Other Ambulatory Visit: Payer: Self-pay

## 2022-11-04 DIAGNOSIS — I739 Peripheral vascular disease, unspecified: Secondary | ICD-10-CM

## 2022-11-04 DIAGNOSIS — E782 Mixed hyperlipidemia: Secondary | ICD-10-CM

## 2022-11-04 MED ORDER — CLOPIDOGREL BISULFATE 75 MG PO TABS
75.0000 mg | ORAL_TABLET | Freq: Every morning | ORAL | 1 refills | Status: DC
Start: 2022-11-04 — End: 2023-06-12

## 2022-11-04 MED ORDER — ATORVASTATIN CALCIUM 20 MG PO TABS
20.0000 mg | ORAL_TABLET | Freq: Every evening | ORAL | 1 refills | Status: DC
Start: 2022-11-04 — End: 2023-06-12

## 2022-11-06 DIAGNOSIS — E119 Type 2 diabetes mellitus without complications: Secondary | ICD-10-CM | POA: Diagnosis not present

## 2022-11-17 ENCOUNTER — Other Ambulatory Visit (HOSPITAL_COMMUNITY): Payer: Self-pay

## 2022-11-23 ENCOUNTER — Other Ambulatory Visit (HOSPITAL_COMMUNITY): Payer: Self-pay

## 2022-11-23 ENCOUNTER — Other Ambulatory Visit: Payer: Self-pay | Admitting: Family Medicine

## 2022-11-23 DIAGNOSIS — G894 Chronic pain syndrome: Secondary | ICD-10-CM | POA: Diagnosis not present

## 2022-11-23 DIAGNOSIS — M47818 Spondylosis without myelopathy or radiculopathy, sacral and sacrococcygeal region: Secondary | ICD-10-CM | POA: Diagnosis not present

## 2022-11-23 DIAGNOSIS — M549 Dorsalgia, unspecified: Secondary | ICD-10-CM | POA: Diagnosis not present

## 2022-11-23 DIAGNOSIS — E0840 Diabetes mellitus due to underlying condition with diabetic neuropathy, unspecified: Secondary | ICD-10-CM

## 2022-11-23 DIAGNOSIS — M25552 Pain in left hip: Secondary | ICD-10-CM | POA: Diagnosis not present

## 2022-11-23 DIAGNOSIS — M5137 Other intervertebral disc degeneration, lumbosacral region: Secondary | ICD-10-CM | POA: Diagnosis not present

## 2022-11-23 DIAGNOSIS — M48061 Spinal stenosis, lumbar region without neurogenic claudication: Secondary | ICD-10-CM | POA: Diagnosis not present

## 2022-11-23 DIAGNOSIS — Z1389 Encounter for screening for other disorder: Secondary | ICD-10-CM | POA: Diagnosis not present

## 2022-11-23 DIAGNOSIS — Z79891 Long term (current) use of opiate analgesic: Secondary | ICD-10-CM | POA: Diagnosis not present

## 2022-11-23 DIAGNOSIS — M47816 Spondylosis without myelopathy or radiculopathy, lumbar region: Secondary | ICD-10-CM | POA: Diagnosis not present

## 2022-11-23 DIAGNOSIS — M169 Osteoarthritis of hip, unspecified: Secondary | ICD-10-CM | POA: Diagnosis not present

## 2022-11-23 DIAGNOSIS — M5136 Other intervertebral disc degeneration, lumbar region: Secondary | ICD-10-CM | POA: Diagnosis not present

## 2022-11-23 MED ORDER — OXYCODONE-ACETAMINOPHEN 7.5-325 MG PO TABS
1.0000 | ORAL_TABLET | Freq: Three times a day (TID) | ORAL | 0 refills | Status: DC
  Filled 2023-02-15: qty 90, 30d supply, fill #0

## 2022-11-24 ENCOUNTER — Ambulatory Visit (INDEPENDENT_AMBULATORY_CARE_PROVIDER_SITE_OTHER): Payer: PPO

## 2022-11-24 DIAGNOSIS — I442 Atrioventricular block, complete: Secondary | ICD-10-CM | POA: Diagnosis not present

## 2022-11-26 LAB — CUP PACEART REMOTE DEVICE CHECK
Battery Remaining Longevity: 140 mo
Battery Voltage: 3.03 V
Brady Statistic AP VP Percent: 1.65 %
Brady Statistic AP VS Percent: 0 %
Brady Statistic AS VP Percent: 98.34 %
Brady Statistic AS VS Percent: 0.01 %
Brady Statistic RA Percent Paced: 1.68 %
Brady Statistic RV Percent Paced: 99.99 %
Date Time Interrogation Session: 20240718212029
Implantable Lead Connection Status: 753985
Implantable Lead Connection Status: 753985
Implantable Lead Implant Date: 20091120
Implantable Lead Implant Date: 20091120
Implantable Lead Location: 753859
Implantable Lead Location: 753860
Implantable Lead Model: 5076
Implantable Lead Model: 5076
Implantable Pulse Generator Implant Date: 20230118
Lead Channel Impedance Value: 304 Ohm
Lead Channel Impedance Value: 418 Ohm
Lead Channel Impedance Value: 722 Ohm
Lead Channel Impedance Value: 779 Ohm
Lead Channel Pacing Threshold Amplitude: 0.625 V
Lead Channel Pacing Threshold Amplitude: 0.75 V
Lead Channel Pacing Threshold Pulse Width: 0.4 ms
Lead Channel Pacing Threshold Pulse Width: 0.4 ms
Lead Channel Sensing Intrinsic Amplitude: 2.25 mV
Lead Channel Sensing Intrinsic Amplitude: 2.25 mV
Lead Channel Sensing Intrinsic Amplitude: 6.75 mV
Lead Channel Setting Pacing Amplitude: 1.5 V
Lead Channel Setting Pacing Amplitude: 2 V
Lead Channel Setting Pacing Pulse Width: 0.4 ms
Lead Channel Setting Sensing Sensitivity: 0.9 mV
Zone Setting Status: 755011
Zone Setting Status: 755011

## 2022-11-27 ENCOUNTER — Other Ambulatory Visit: Payer: Self-pay | Admitting: Family Medicine

## 2022-11-27 DIAGNOSIS — E1140 Type 2 diabetes mellitus with diabetic neuropathy, unspecified: Secondary | ICD-10-CM

## 2022-12-05 ENCOUNTER — Other Ambulatory Visit (HOSPITAL_COMMUNITY): Payer: Self-pay

## 2022-12-05 MED ORDER — OXYCODONE-ACETAMINOPHEN 7.5-325 MG PO TABS
1.0000 | ORAL_TABLET | Freq: Three times a day (TID) | ORAL | 0 refills | Status: DC
Start: 2022-12-05 — End: 2022-12-18
  Filled 2022-12-05 – 2022-12-15 (×2): qty 90, 30d supply, fill #0

## 2022-12-06 ENCOUNTER — Other Ambulatory Visit: Payer: Self-pay | Admitting: Family Medicine

## 2022-12-06 DIAGNOSIS — E0840 Diabetes mellitus due to underlying condition with diabetic neuropathy, unspecified: Secondary | ICD-10-CM

## 2022-12-07 NOTE — Progress Notes (Signed)
Remote pacemaker transmission.   

## 2022-12-15 ENCOUNTER — Other Ambulatory Visit (HOSPITAL_COMMUNITY): Payer: Self-pay

## 2022-12-18 ENCOUNTER — Ambulatory Visit: Payer: PPO | Admitting: Surgery

## 2022-12-18 ENCOUNTER — Ambulatory Visit (INDEPENDENT_AMBULATORY_CARE_PROVIDER_SITE_OTHER)
Admission: RE | Admit: 2022-12-18 | Discharge: 2022-12-18 | Disposition: A | Discharge status: Home / Self Care | Payer: PPO | Source: Ambulatory Visit | Attending: Surgery | Admitting: Surgery

## 2022-12-18 ENCOUNTER — Encounter: Payer: Self-pay | Admitting: Surgery

## 2022-12-18 ENCOUNTER — Other Ambulatory Visit: Payer: Self-pay | Admitting: Surgery

## 2022-12-18 ENCOUNTER — Ambulatory Visit (HOSPITAL_COMMUNITY)
Admission: RE | Admit: 2022-12-18 | Discharge: 2022-12-18 | Disposition: A | Discharge status: Home / Self Care | Payer: PPO | Source: Ambulatory Visit | Attending: Surgery | Admitting: Surgery

## 2022-12-18 VITALS — BP 175/77 | HR 86 | Temp 98.1°F | Wt 150.0 lb

## 2022-12-18 DIAGNOSIS — I70222 Atherosclerosis of native arteries of extremities with rest pain, left leg: Secondary | ICD-10-CM

## 2022-12-18 DIAGNOSIS — I70249 Atherosclerosis of native arteries of left leg with ulceration of unspecified site: Secondary | ICD-10-CM

## 2022-12-18 DIAGNOSIS — I739 Peripheral vascular disease, unspecified: Secondary | ICD-10-CM | POA: Diagnosis not present

## 2022-12-18 LAB — VAS US ABI WITH/WO TBI
Left ABI: 0.7
Right ABI: 0.87

## 2022-12-18 NOTE — Progress Notes (Signed)
[facility]  Patient name: Andrew Wiggins MRN: [id number] DOB: [birth year] Sex: male   REASON FOR VISIT:    Follow up  HISOTRY OF PRESENT ILLNESS:    Andrew Wiggins is a 67 y.o. 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  He was seen in [place] clinic on 07/27/2022.  At that time he had developed a new wound between his fourth and fifth left toes.  He underwent angiogram by Dr. Lenell Antu on 07/28/2022.  He had approximately a 95%  restenosis.  This was treated with a 5 mm Zilver stent.  At his visit in April 2024, all wounds were healed.  He was having progressive stenosis in the treated vessels.  I told him that I would be reluctant to continue treating him percutaneously as he has had limited durability from endovascular interventions.  I said I would be reluctant to chase velocity elevations unless he became symptomatic.  Patient is a current smoker.  He has COPD.  He takes a statin for hypercholesterolemia.  He is medically managed for hypertension.  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 ~  Jan11:  Last seen by Cards & pacer reprogrammed...     Cardiac pacemaker in situ    Carotid artery disease (HCC) 07/27/2010   R/O PERIPHERAL VASCULAR DISEASE (ICD-443.9) - on ASA 81mg /d... exam 11/09 shows gr 1-2 sys murmur LSB, but also has prominent  bruit to base of right side of his neck w/ right > left Carotid Bruit... ~  CDopplers 11/09 showed severe plaque right ICA, & min plaque on left... no signif ICA stenoses per report. 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 smokes 1/2 - 1ppd x yrs... ~  PFT's 11/08 showed FVC= 3.85 (79%), FEV1= 2.19 (55%), %1sec= 57, mid-flows= 32% predicted:  all c/w mod airflow obstruction...  ~  CXR 12/10 showed left subclav pacer, mild hyperinflation, NAD...     Depression    Dermatitis, contact    due to poison ivy   Diabetes mellitus (HCC) 02/04/2020   Diabetes mellitus with neuropathy (HCC) 12/20/2016   Dyslipidemia 03/06/2012   Essential hypertension 04/10/2007    HYPERTENSION (ICD-401.9) - prev on Micardis 80mg /d then switched to Losartan 100mg /d, but he lost his job in 2011& couldn't afford Rx;  He called [place] 2/12 w/ this news & we phoned in Lisinopril 20mg /d... ~  2DEcho 11/09 showed sl incr LVwall thickness, norm LVF w/ EF= 55-60%, no regional wall motion abn, mild DD, mild MR... ~  Myoview 12/09 showed LBBB, no perfusion defects, abn  septal motion & EF   GERD 03/26/2008    GERD (ICD-530.81) - prev mild GERD symptoms that improved after cholecystectomy on 2000... uses OTC PPI as needed... ~  he is due for routine screening colonoscopy...       Mixed hyperlipidemia 06/19/2019   PVD (peripheral vascular disease) (HCC)    Sciatica 11/18/2019   Tobacco use 02/04/2020   Tobacco use disorder      FAMILY HISTORY:   Family History  Problem Relation Age of Onset   Heart disease Brother    Leukemia Brother     SOCIAL HISTORY:   Social History   Tobacco Use   Smoking status: Every Day    Current packs/day: 0.50    Average  packs/day: 0.5 packs/day for 25.0 years (12.5 ttl pk-yrs)    Types: Cigarettes    Passive exposure: Never   Smokeless tobacco: Never  Substance Use Topics   Alcohol use: Not Currently    Alcohol/week: 0.0 standard drinks of alcohol     ALLERGIES:   No Known Allergies   CURRENT MEDICATIONS:   Current Outpatient Medications  Medication Sig Dispense Refill   albuterol (VENTOLIN HFA) 108 (90 Base) MCG/ACT inhaler INHALE 2 PUFFS BY MOUTH EVERY 6 HOURS AS NEEDED FOR WHEEZING OR SHORTNESS OF BREATH 18 g 0   aspirin EC 81 MG tablet Take 81 mg by mouth daily. Swallow whole.     atorvastatin (LIPITOR) 20 MG tablet Take 1 tablet (20 mg total) by mouth every evening. 90 tablet 1   clopidogrel (PLAVIX) 75 MG tablet Take 1 tablet (75 mg total) by mouth every morning. 90 tablet 1   doxycycline (VIBRAMYCIN) 100 MG capsule Take 1 capsule (100 mg total) by mouth 2 (two) times daily. 14 capsule 0   FEROSUL 325 (65 Fe) MG tablet TAKE ONE TABLET BY MOUTH EVERY MORNING 90 tablet 0   gabapentin (NEURONTIN) 300 MG capsule TAKE TWO CAPSULES BY MOUTH THREE TIMES DAILY 180 capsule 1   glimepiride (AMARYL) 4 MG tablet TAKE ONE TABLET BY MOUTH EVERY MORNING 30 tablet 0   metFORMIN (GLUCOPHAGE) 1000 MG tablet TAKE ONE TABLET BY MOUTH TWICE DAILY WITH A MEAL 180 tablet 1   Omega-3 Fatty Acids (FISH OIL) 1000 MG CAPS Take 2 capsules (2,000 mg total) by mouth 2 (two) times daily. 180 capsule 12   oxyCODONE-acetaminophen (PERCOCET) 7.5-325 MG tablet Take 1 tablet by mouth 3 (three) times daily for back pain. 90 tablet 0   valsartan-hydrochlorothiazide (DIOVAN-HCT) 160-12.5 MG tablet TAKE ONE TABLET BY MOUTH EVERY MORNING 90 tablet 0   No current facility-administered medications for this visit.    REVIEW OF SYSTEMS:   [X]  denotes positive finding, [ ]  denotes negative finding Cardiac  Comments:  Chest pain or chest pressure:    Shortness of breath upon exertion:    Short of breath when lying flat:     Irregular heart rhythm:        Vascular    Pain in calf, thigh, or hip brought on by ambulation:    Pain in feet at night that wakes you up from your sleep:     Blood clot in your veins:    Leg swelling:  x       Pulmonary    Oxygen at home:    Productive cough:     Wheezing:         Neurologic    Sudden weakness in arms or legs:     Sudden numbness in arms  or legs:     Sudden onset of difficulty speaking or slurred speech:    Temporary loss of vision in one eye:     Problems with dizziness:         Gastrointestinal    Blood in stool:     Vomited blood:         Genitourinary    Burning when urinating:     Blood in urine:        Psychiatric    Major depression:         Hematologic    Bleeding problems:    Problems with blood clotting too easily:        Skin    Rashes or ulcers:        Constitutional    Fever or chills:      PHYSICAL EXAM:   Vitals:   12/18/22 1435  BP: (!) 175/77  Pulse: 86  Temp: 98.1 F (36.7 C)  TempSrc: Temporal  SpO2: 92%  Weight: 150 lb (68 kg)    GENERAL: The patient is a well-nourished male, in no acute distress. The vital signs are documented above. CARDIAC: There is a regular rate and rhythm.  VASCULAR: 2+ bilateral pitting edema.  Pedal pulses are nonpalpable PULMONARY: Non-labored respirations MUSCULOSKELETAL: There are no major deformities or cyanosis. NEUROLOGIC: No focal weakness or paresthesias are detected. SKIN: There are no ulcers or rashes noted. PSYCHIATRIC: The patient has a normal affect.  STUDIES:   I have reviewed the following: ABI/TBIToday's ABIToday's TBIPrevious ABIPrevious TBI  +-------+-----------+-----------+------------+------------+  Right 0.87       0.42       0.80        0.35          +-------+-----------+-----------+------------+------------+  Left  0.70       0.41       0.59        0.29          +-------+-----------+-----------+------------+------------+  Right toe  pressure: 69, multiphasic waveforms Left toe pressure: 68, multiphasic waveforms  LEFT      PSV cm/sRatioStenosis       Waveform             Comments  +----------+--------+-----+---------------+---------------------+--------+  CFA Mid   136                         technically triphasic          +----------+--------+-----+---------------+---------------------+--------+  CFA [id number]                         monophasic                     +----------+--------+-----+---------------+---------------------+--------+  DFA      153                         technically triphasic          +----------+--------+-----+---------------+---------------------+--------+  SFA Prox  151                         technically triphasic          +----------+--------+-----+---------------+---------------------+--------+  SFA Mid   210          50-74% stenosistechnically triphasic          +----------+--------+-----+---------------+---------------------+--------+  SFA [id number]  technically triphasic          +----------+--------+-----+---------------+---------------------+--------+  POP Prox  76                                                         +----------+--------+-----+---------------+---------------------+--------+  Left: 50-74% stenosis noted in the superficial femoral artery. Patent  stent with no evidence of stenosis in the Left SFA-popliteal artery.    MEDICAL ISSUES:   PAD with ulceration of the left leg: Fortunately the patient has been able to heal all of his wounds.  As I have stated previously, he has very small arteries and has had limited durability with endovascular treatment.  Fortunately today, his ultrasound studies show no significant deterioration.  His ABIs remain stable.  I will continue to monitor him with surveillance imaging, the next study would be in 6 months.  I did state that if we get good durability with  his most recent intervention, he could be considered for repeat treatment if needed at a later date.  Otherwise, he would probably do better with bypass surgery.  Lower extremity swelling: He has bilateral 2+ pitting edema.  I have encouraged him to keep his legs elevated and have given him elastic therapies contact information for 15-20 compression socks.    Charlena Cross, MD, FACS [facility] [phone] Pager [phone]

## 2022-12-22 ENCOUNTER — Other Ambulatory Visit: Payer: Self-pay

## 2022-12-22 DIAGNOSIS — I739 Peripheral vascular disease, unspecified: Secondary | ICD-10-CM

## 2022-12-23 ENCOUNTER — Other Ambulatory Visit: Payer: Self-pay | Admitting: Family Medicine

## 2022-12-23 DIAGNOSIS — E1140 Type 2 diabetes mellitus with diabetic neuropathy, unspecified: Secondary | ICD-10-CM

## 2022-12-28 ENCOUNTER — Other Ambulatory Visit (HOSPITAL_COMMUNITY): Payer: Self-pay

## 2022-12-28 DIAGNOSIS — M25552 Pain in left hip: Secondary | ICD-10-CM | POA: Diagnosis not present

## 2022-12-28 DIAGNOSIS — M48061 Spinal stenosis, lumbar region without neurogenic claudication: Secondary | ICD-10-CM | POA: Diagnosis not present

## 2022-12-28 DIAGNOSIS — M549 Dorsalgia, unspecified: Secondary | ICD-10-CM | POA: Diagnosis not present

## 2022-12-28 DIAGNOSIS — M5137 Other intervertebral disc degeneration, lumbosacral region: Secondary | ICD-10-CM | POA: Diagnosis not present

## 2022-12-28 DIAGNOSIS — M169 Osteoarthritis of hip, unspecified: Secondary | ICD-10-CM | POA: Diagnosis not present

## 2022-12-28 DIAGNOSIS — M47818 Spondylosis without myelopathy or radiculopathy, sacral and sacrococcygeal region: Secondary | ICD-10-CM | POA: Diagnosis not present

## 2022-12-28 DIAGNOSIS — Z1389 Encounter for screening for other disorder: Secondary | ICD-10-CM | POA: Diagnosis not present

## 2022-12-28 DIAGNOSIS — G894 Chronic pain syndrome: Secondary | ICD-10-CM | POA: Diagnosis not present

## 2022-12-28 DIAGNOSIS — M47816 Spondylosis without myelopathy or radiculopathy, lumbar region: Secondary | ICD-10-CM | POA: Diagnosis not present

## 2022-12-28 DIAGNOSIS — Z79891 Long term (current) use of opiate analgesic: Secondary | ICD-10-CM | POA: Diagnosis not present

## 2022-12-28 DIAGNOSIS — M5136 Other intervertebral disc degeneration, lumbar region: Secondary | ICD-10-CM | POA: Diagnosis not present

## 2022-12-28 MED ORDER — OXYCODONE-ACETAMINOPHEN 7.5-325 MG PO TABS
1.0000 | ORAL_TABLET | Freq: Three times a day (TID) | ORAL | 0 refills | Status: DC
Start: 2022-12-28 — End: 2023-06-08
  Filled 2023-01-12: qty 90, 30d supply, fill #0

## 2023-01-04 DIAGNOSIS — D509 Iron deficiency anemia, unspecified: Secondary | ICD-10-CM | POA: Diagnosis not present

## 2023-01-04 DIAGNOSIS — K649 Unspecified hemorrhoids: Secondary | ICD-10-CM | POA: Diagnosis not present

## 2023-01-04 DIAGNOSIS — R1310 Dysphagia, unspecified: Secondary | ICD-10-CM | POA: Diagnosis not present

## 2023-01-08 ENCOUNTER — Other Ambulatory Visit: Payer: Self-pay | Admitting: Family Medicine

## 2023-01-08 DIAGNOSIS — E0840 Diabetes mellitus due to underlying condition with diabetic neuropathy, unspecified: Secondary | ICD-10-CM

## 2023-01-12 ENCOUNTER — Other Ambulatory Visit: Payer: Self-pay | Admitting: Family Medicine

## 2023-01-12 ENCOUNTER — Other Ambulatory Visit (HOSPITAL_COMMUNITY): Payer: Self-pay

## 2023-01-12 DIAGNOSIS — E0840 Diabetes mellitus due to underlying condition with diabetic neuropathy, unspecified: Secondary | ICD-10-CM

## 2023-01-12 DIAGNOSIS — D500 Iron deficiency anemia secondary to blood loss (chronic): Secondary | ICD-10-CM

## 2023-01-20 ENCOUNTER — Other Ambulatory Visit: Payer: Self-pay | Admitting: Family Medicine

## 2023-01-20 DIAGNOSIS — E1140 Type 2 diabetes mellitus with diabetic neuropathy, unspecified: Secondary | ICD-10-CM

## 2023-01-25 ENCOUNTER — Other Ambulatory Visit (HOSPITAL_COMMUNITY): Payer: Self-pay

## 2023-01-25 DIAGNOSIS — M5136 Other intervertebral disc degeneration, lumbar region: Secondary | ICD-10-CM | POA: Diagnosis not present

## 2023-01-25 DIAGNOSIS — M47818 Spondylosis without myelopathy or radiculopathy, sacral and sacrococcygeal region: Secondary | ICD-10-CM | POA: Diagnosis not present

## 2023-01-25 DIAGNOSIS — M48061 Spinal stenosis, lumbar region without neurogenic claudication: Secondary | ICD-10-CM | POA: Diagnosis not present

## 2023-01-25 DIAGNOSIS — M25552 Pain in left hip: Secondary | ICD-10-CM | POA: Diagnosis not present

## 2023-01-25 DIAGNOSIS — M549 Dorsalgia, unspecified: Secondary | ICD-10-CM | POA: Diagnosis not present

## 2023-01-25 DIAGNOSIS — Z79891 Long term (current) use of opiate analgesic: Secondary | ICD-10-CM | POA: Diagnosis not present

## 2023-01-25 DIAGNOSIS — G894 Chronic pain syndrome: Secondary | ICD-10-CM | POA: Diagnosis not present

## 2023-01-25 DIAGNOSIS — M5137 Other intervertebral disc degeneration, lumbosacral region: Secondary | ICD-10-CM | POA: Diagnosis not present

## 2023-01-25 DIAGNOSIS — M47816 Spondylosis without myelopathy or radiculopathy, lumbar region: Secondary | ICD-10-CM | POA: Diagnosis not present

## 2023-01-25 DIAGNOSIS — M169 Osteoarthritis of hip, unspecified: Secondary | ICD-10-CM | POA: Diagnosis not present

## 2023-01-25 DIAGNOSIS — Z1389 Encounter for screening for other disorder: Secondary | ICD-10-CM | POA: Diagnosis not present

## 2023-01-25 MED ORDER — OXYCODONE-ACETAMINOPHEN 7.5-325 MG PO TABS: 1.0000 | ORAL_TABLET | Freq: Three times a day (TID) | ORAL | 0 refills | Status: DC

## 2023-01-29 NOTE — Progress Notes (Signed)
Fax received from [facility] on 01/04/23 for medical clearance/medication hold for EGD/colonoscopy to be signed by V.W. Myra Gianotti, MD.  Provider signed on 01/18/23, form faxed back to sender on 01/19/23, verified successful, scanned into chart.

## 2023-02-15 ENCOUNTER — Other Ambulatory Visit (HOSPITAL_COMMUNITY): Payer: Self-pay

## 2023-02-15 NOTE — Progress Notes (Signed)
[facility]  [address] [place],  [place]  [address] [phone]  Clinic Day:  02/16/2023  Referring physician: No ref. provider found   HISTORY OF PRESENT ILLNESS:  The patient is a 67 y.o. male with iron deficiency anemia.  He comes in today to reassess his iron and hemoglobin levels after recently receiving IV iron.  Of note, the patient has felt better since his IV iron was given.  He continues to deny having any overt forms of blood loss to explain his past anemia.  Of note, he is scheduled for his GI workup later this month.     PHYSICAL EXAM:  Blood pressure (!) 195/86, pulse 99, temperature 98.4 F (36.9 C), resp. rate 16, height 5\' 9"  (1.753 m), weight 149 lb 3.2 oz (67.7 kg), SpO2 96%. Wt Readings from Last 3 Encounters:  02/16/23 149 lb 3.2 oz (67.7 kg)  12/18/22 150 lb (68 kg)  10/16/22 149 lb 4.8 oz (67.7 kg)   Body mass index is 22.03 kg/m. Performance status (ECOG): 0 - Asymptomatic Physical Exam Constitutional:      Appearance: Normal appearance. He is not ill-appearing.  HENT:     Mouth/Throat:     Mouth: Mucous membranes are moist.     Pharynx: Oropharynx is clear. No oropharyngeal exudate or posterior oropharyngeal erythema.  Cardiovascular:     Rate and Rhythm: Normal rate and regular rhythm.     Heart sounds: No murmur heard.    No friction rub. No gallop.  Pulmonary:     Effort: Pulmonary effort is normal. No respiratory distress.     Breath sounds: Normal breath sounds. No wheezing, rhonchi or rales.  Abdominal:     General: Bowel sounds are normal. There is no distension.     Palpations: Abdomen is soft. There is no mass.     Tenderness: There is no abdominal tenderness.  Musculoskeletal:        General: No swelling.     Right lower leg: No edema.     Left lower leg: No edema.  Lymphadenopathy:     Cervical: No cervical adenopathy.     Upper Body:     Right upper body: No supraclavicular or axillary  adenopathy.     Left upper body: No supraclavicular or axillary adenopathy.     Lower Body: No right inguinal adenopathy. No left inguinal adenopathy.  Skin:    General: Skin is warm.     Coloration: Skin is not jaundiced.     Findings: No lesion or rash.  Neurological:     General: No focal deficit present.     Mental Status: He is alert and oriented to person, place, and time. Mental status is at baseline.  Psychiatric:        Mood and Affect: Mood normal.        Behavior: Behavior normal.        Thought Content: Thought content normal.     LABS:    Latest Reference Range & Units 02/16/23 00:00 02/16/23 14:28  Sodium 135 - 145 mmol/L  136  Potassium 3.5 - 5.1 mmol/L  4.4  Chloride 98 - 111 mmol/L  101  CO2 22 - 32 mmol/L  25  Glucose 70 - 99 mg/dL  914 (H)  BUN 8 - 23 mg/dL  18  Creatinine 7.82 - 1.24 mg/dL  9.56  Calcium 8.9 - 21.3 mg/dL  8.9  Anion gap 5 - 15   10  Alkaline  Phosphatase 38 - 126 U/L  85  Albumin 3.5 - 5.0 g/dL  4.0  AST 15 - 41 U/L  15  ALT 0 - 44 U/L  21  Total Protein 6.5 - 8.1 g/dL  6.7  Total Bilirubin 0.3 - 1.2 mg/dL  0.4  GFR, Est Non African American >60 mL/min  >60  Iron 45 - 182 ug/dL  85  UIBC ug/dL  098  TIBC 119 - 147 ug/dL  829  Saturation Ratios 17.9 - 39.5 %  21  Ferritin 24 - 336 ng/mL  15 (L)  CBC W DIFFERENTIAL ([facility] SCANNED REPORT)   Rpt (IP)  WBC  8.6 (E)   RBC 3.87 - 5.11  4.18 (E)   Hemoglobin 13.5 - 17.5  13.3 ! (E)   HCT 41 - 53  39 ! (E)   Platelets 150 - 400 K/uL 240 (E)   NEUT#  5.59 (E)   (H): Data is abnormally high (L): Data is abnormally low !: Data is abnormal  ASSESSMENT & PLAN:  A 67 y.o. male with iron deficiency anemia.  Although his hemoglobin is even better today, his ferritin is low again, which is consistent with persistently low iron stores.  Based upon this, I will arrange for him to receive another course of IV iron.   He knows to keep his GI appointment for later this month.  to have this done  within the forthcoming weeks/months.  Otherwise, I will see this patient back in 4 months for repeat clinical assessment.  The patient understands all the plans discussed today and is in agreement with them.   Brigitt Mcclish Kirby Funk, MD

## 2023-02-16 ENCOUNTER — Other Ambulatory Visit: Payer: Self-pay | Admitting: Family Medicine

## 2023-02-16 ENCOUNTER — Other Ambulatory Visit: Payer: Self-pay | Admitting: Oncology

## 2023-02-16 ENCOUNTER — Inpatient Hospital Stay: Payer: PPO

## 2023-02-16 ENCOUNTER — Inpatient Hospital Stay: Payer: PPO | Attending: Oncology | Admitting: Oncology

## 2023-02-16 VITALS — BP 195/86 | HR 99 | Temp 98.4°F | Resp 16 | Ht 69.0 in | Wt 149.2 lb

## 2023-02-16 DIAGNOSIS — D509 Iron deficiency anemia, unspecified: Secondary | ICD-10-CM | POA: Diagnosis not present

## 2023-02-16 DIAGNOSIS — D500 Iron deficiency anemia secondary to blood loss (chronic): Secondary | ICD-10-CM

## 2023-02-16 DIAGNOSIS — E1140 Type 2 diabetes mellitus with diabetic neuropathy, unspecified: Secondary | ICD-10-CM

## 2023-02-16 LAB — CMP (CANCER CENTER ONLY)
ALT: 21 U/L (ref 0–44)
AST: 15 U/L (ref 15–41)
Albumin: 4 g/dL (ref 3.5–5.0)
Alkaline Phosphatase: 85 U/L (ref 38–126)
Anion gap: 10 (ref 5–15)
BUN: 18 mg/dL (ref 8–23)
CO2: 25 mmol/L (ref 22–32)
Calcium: 8.9 mg/dL (ref 8.9–10.3)
Chloride: 101 mmol/L (ref 98–111)
Creatinine: 0.97 mg/dL (ref 0.61–1.24)
GFR, Estimated: >60 mL/min
Glucose, Bld: 232 mg/dL — ABNORMAL HIGH (ref 70–99)
Potassium: 4.4 mmol/L (ref 3.5–5.1)
Sodium: 136 mmol/L (ref 135–145)
Total Bilirubin: 0.4 mg/dL (ref 0.3–1.2)
Total Protein: 6.7 g/dL (ref 6.5–8.1)

## 2023-02-16 LAB — FERRITIN: Ferritin: 15 ng/mL — ABNORMAL LOW (ref 24–336)

## 2023-02-16 LAB — CBC AND DIFFERENTIAL
HCT: 39 — AB (ref 41–53)
Hemoglobin: 13.3 — AB (ref 13.5–17.5)
Neutrophils Absolute: 5.59
Platelets: 240 10*3/uL (ref 150–400)
WBC: 8.6

## 2023-02-16 LAB — IRON AND TIBC
Iron: 85 ug/dL (ref 45–182)
Saturation Ratios: 21 % (ref 17.9–39.5)
TIBC: 413 ug/dL (ref 250–450)
UIBC: 328 ug/dL

## 2023-02-16 LAB — CBC: RBC: 4.18 (ref 3.87–5.11)

## 2023-02-17 ENCOUNTER — Encounter: Payer: Self-pay | Admitting: Oncology

## 2023-02-17 ENCOUNTER — Other Ambulatory Visit: Payer: Self-pay | Admitting: Oncology

## 2023-02-17 DIAGNOSIS — D500 Iron deficiency anemia secondary to blood loss (chronic): Secondary | ICD-10-CM

## 2023-02-21 ENCOUNTER — Telehealth: Payer: Self-pay | Admitting: Oncology

## 2023-02-21 NOTE — Telephone Encounter (Signed)
Contacted pt to schedule an appt. Unable to reach via phone.   Scheduling Message Entered by Rennis Harding A on 02/16/2023 at  5:30 PM Priority: Routine <No visit type provided>  Department: [facility]  Provider:  Scheduling Notes:  Labs/appt 08-17-23

## 2023-02-21 NOTE — Telephone Encounter (Signed)
Scheduling Message Entered by Rennis Harding A on 02/17/2023 at  1:28 PM Priority: Routine <No visit type provided>  Department: [facility]  Provider:  Scheduling Notes:  IV iron next week  Labs/appt 06-19-23 (cancel April 2025 appt)

## 2023-02-23 ENCOUNTER — Ambulatory Visit (INDEPENDENT_AMBULATORY_CARE_PROVIDER_SITE_OTHER): Payer: PPO

## 2023-02-23 DIAGNOSIS — I442 Atrioventricular block, complete: Secondary | ICD-10-CM | POA: Diagnosis not present

## 2023-02-24 LAB — CUP PACEART REMOTE DEVICE CHECK
Battery Remaining Longevity: 137 mo
Battery Voltage: 3.02 V
Brady Statistic AP VP Percent: 2.37 %
Brady Statistic AP VS Percent: 0 %
Brady Statistic AS VP Percent: 97.56 %
Brady Statistic AS VS Percent: 0.06 %
Brady Statistic RA Percent Paced: 2.38 %
Brady Statistic RV Percent Paced: 99.94 %
Date Time Interrogation Session: 20241018003030
Implantable Lead Connection Status: 753985
Implantable Lead Connection Status: 753985
Implantable Lead Implant Date: 20091120
Implantable Lead Implant Date: 20091120
Implantable Lead Location: 753859
Implantable Lead Location: 753860
Implantable Lead Model: 5076
Implantable Lead Model: 5076
Implantable Pulse Generator Implant Date: 20230118
Lead Channel Impedance Value: 304 Ohm
Lead Channel Impedance Value: 418 Ohm
Lead Channel Impedance Value: 722 Ohm
Lead Channel Impedance Value: 779 Ohm
Lead Channel Pacing Threshold Amplitude: 0.75 V
Lead Channel Pacing Threshold Amplitude: 0.75 V
Lead Channel Pacing Threshold Pulse Width: 0.4 ms
Lead Channel Pacing Threshold Pulse Width: 0.4 ms
Lead Channel Sensing Intrinsic Amplitude: 2.625 mV
Lead Channel Sensing Intrinsic Amplitude: 2.625 mV
Lead Channel Sensing Intrinsic Amplitude: 6.75 mV
Lead Channel Setting Pacing Amplitude: 1.5 V
Lead Channel Setting Pacing Amplitude: 2 V
Lead Channel Setting Pacing Pulse Width: 0.4 ms
Lead Channel Setting Sensing Sensitivity: 0.9 mV
Zone Setting Status: 755011
Zone Setting Status: 755011

## 2023-02-27 ENCOUNTER — Telehealth: Payer: Self-pay | Admitting: Oncology

## 2023-02-27 NOTE — Telephone Encounter (Signed)
Contacted pt to schedule an appt. Unable to reach via phone, voicemail was left.   Scheduling Message Entered by Rennis Harding A on 02/17/2023 at  1:28 PM Priority: Routine <No visit type provided>  Department: [facility]  Provider:  Scheduling Notes:  IV iron next week  Labs/appt 06-19-23 (cancel April 2025 appt)

## 2023-03-01 ENCOUNTER — Other Ambulatory Visit (HOSPITAL_COMMUNITY): Payer: Self-pay

## 2023-03-01 DIAGNOSIS — M47818 Spondylosis without myelopathy or radiculopathy, sacral and sacrococcygeal region: Secondary | ICD-10-CM | POA: Diagnosis not present

## 2023-03-01 DIAGNOSIS — M549 Dorsalgia, unspecified: Secondary | ICD-10-CM | POA: Diagnosis not present

## 2023-03-01 DIAGNOSIS — M48061 Spinal stenosis, lumbar region without neurogenic claudication: Secondary | ICD-10-CM | POA: Diagnosis not present

## 2023-03-01 DIAGNOSIS — M47816 Spondylosis without myelopathy or radiculopathy, lumbar region: Secondary | ICD-10-CM | POA: Diagnosis not present

## 2023-03-01 DIAGNOSIS — Z79891 Long term (current) use of opiate analgesic: Secondary | ICD-10-CM | POA: Diagnosis not present

## 2023-03-01 DIAGNOSIS — Z1389 Encounter for screening for other disorder: Secondary | ICD-10-CM | POA: Diagnosis not present

## 2023-03-01 DIAGNOSIS — M169 Osteoarthritis of hip, unspecified: Secondary | ICD-10-CM | POA: Diagnosis not present

## 2023-03-01 DIAGNOSIS — G894 Chronic pain syndrome: Secondary | ICD-10-CM | POA: Diagnosis not present

## 2023-03-01 DIAGNOSIS — M25552 Pain in left hip: Secondary | ICD-10-CM | POA: Diagnosis not present

## 2023-03-01 MED ORDER — OXYCODONE-ACETAMINOPHEN 7.5-325 MG PO TABS
1.0000 | ORAL_TABLET | Freq: Three times a day (TID) | ORAL | 0 refills | Status: DC
Start: 2023-03-01 — End: 2023-06-08
  Filled 2023-03-16: qty 90, 30d supply, fill #0

## 2023-03-05 NOTE — Telephone Encounter (Signed)
Contacted pt to schedule an appt. Unable to reach via phone, voicemail was left.

## 2023-03-08 DIAGNOSIS — R1310 Dysphagia, unspecified: Secondary | ICD-10-CM | POA: Diagnosis not present

## 2023-03-08 DIAGNOSIS — K449 Diaphragmatic hernia without obstruction or gangrene: Secondary | ICD-10-CM | POA: Diagnosis not present

## 2023-03-08 DIAGNOSIS — D509 Iron deficiency anemia, unspecified: Secondary | ICD-10-CM | POA: Diagnosis not present

## 2023-03-08 DIAGNOSIS — K5520 Angiodysplasia of colon without hemorrhage: Secondary | ICD-10-CM | POA: Diagnosis not present

## 2023-03-08 DIAGNOSIS — E119 Type 2 diabetes mellitus without complications: Secondary | ICD-10-CM | POA: Diagnosis not present

## 2023-03-08 DIAGNOSIS — K2971 Gastritis, unspecified, with bleeding: Secondary | ICD-10-CM | POA: Diagnosis not present

## 2023-03-08 DIAGNOSIS — I10 Essential (primary) hypertension: Secondary | ICD-10-CM | POA: Diagnosis not present

## 2023-03-08 DIAGNOSIS — K2970 Gastritis, unspecified, without bleeding: Secondary | ICD-10-CM | POA: Diagnosis not present

## 2023-03-08 DIAGNOSIS — K2961 Other gastritis with bleeding: Secondary | ICD-10-CM | POA: Diagnosis not present

## 2023-03-08 LAB — HM COLONOSCOPY

## 2023-03-09 NOTE — Progress Notes (Signed)
Remote pacemaker transmission.   

## 2023-03-12 NOTE — Telephone Encounter (Signed)
Contacted pt to schedule an appt. Unable to reach via phone, voicemail was left.

## 2023-03-13 ENCOUNTER — Encounter: Payer: Self-pay | Admitting: Oncology

## 2023-03-15 ENCOUNTER — Telehealth: Payer: Self-pay

## 2023-03-15 NOTE — Telephone Encounter (Signed)
I called the patient today to see about getting an appointment scheduled for chronic appointment. Looks like this patient is currently overdue for an appointment. I left a message for the patient to call the office back.  NOTE: If the patient does not call back within a week to schedule this appointment, the front staff will mail the patient a letter requesting to call the office back.

## 2023-03-15 NOTE — Telephone Encounter (Signed)
-----   Message from Blane Ohara sent at 03/14/2023  9:48 PM EST ----- Regarding: needs appointment Patient needs a chronic appointment. - overdue. Dr. Sedalia Muta

## 2023-03-16 ENCOUNTER — Other Ambulatory Visit (HOSPITAL_COMMUNITY): Payer: Self-pay

## 2023-03-19 ENCOUNTER — Other Ambulatory Visit: Payer: Self-pay

## 2023-03-19 DIAGNOSIS — F32A Depression, unspecified: Secondary | ICD-10-CM | POA: Insufficient documentation

## 2023-03-19 DIAGNOSIS — M1990 Unspecified osteoarthritis, unspecified site: Secondary | ICD-10-CM | POA: Insufficient documentation

## 2023-03-19 DIAGNOSIS — D649 Anemia, unspecified: Secondary | ICD-10-CM | POA: Insufficient documentation

## 2023-03-20 ENCOUNTER — Encounter: Payer: Self-pay | Admitting: Cardiology

## 2023-03-20 ENCOUNTER — Ambulatory Visit: Payer: PPO | Attending: Cardiology | Admitting: Cardiology

## 2023-03-20 VITALS — BP 158/70 | HR 89 | Ht 70.0 in | Wt 150.0 lb

## 2023-03-20 DIAGNOSIS — I739 Peripheral vascular disease, unspecified: Secondary | ICD-10-CM | POA: Diagnosis not present

## 2023-03-20 DIAGNOSIS — I442 Atrioventricular block, complete: Secondary | ICD-10-CM

## 2023-03-20 DIAGNOSIS — I10 Essential (primary) hypertension: Secondary | ICD-10-CM

## 2023-03-20 DIAGNOSIS — J4489 Other specified chronic obstructive pulmonary disease: Secondary | ICD-10-CM | POA: Diagnosis not present

## 2023-03-20 DIAGNOSIS — E782 Mixed hyperlipidemia: Secondary | ICD-10-CM

## 2023-03-20 NOTE — Patient Instructions (Signed)

## 2023-03-20 NOTE — Progress Notes (Signed)
Cardiology Office Note:    Date:  03/20/2023   ID:  Andrew Wiggins, DOB 25-Mar-1956, MRN [id number]  PCP:  Blane Ohara, MD  Cardiologist:  Garwin Brothers, MD   Referring MD: No ref. provider found    ASSESSMENT:    1. Mixed hyperlipidemia   2. Complete AV block (HCC)   3. Essential hypertension   4. PVD (peripheral vascular disease) (HCC)   5. COPD (chronic obstructive pulmonary disease) with chronic bronchitis (HCC)    PLAN:    In order of problems listed above:  Atherosclerotic vascular disease: Peripheral vascular disease: Secondary prevention stressed to the patient.  Importance of compliance with diet medication stressed any vocalized understanding.  He was advised to walk to the best of his ability and he promises to do so. Essential hypertension: Blood pressure stable and diet was emphasized.  Lifestyle modification urged.  He mentions to me that his blood pressure is better at home.  I rechecked it and it was in the range of 134/70. Mixed dyslipidemia: On lipid-lowering medications followed by primary care.  He wants blood work to be done by primary care.  I told him to send me a copy.  Goal LDL must be less than 60. Peripheral vascular disease: Followed by vascular specialist.  Goal LDL should be less than 60. Cigarette smoker: I spent 5 minutes with the patient discussing solely about smoking. Smoking cessation was counseled. I suggested to the patient also different medications and pharmacological interventions. Patient is keen to try stopping on its own at this time. He will get back to me if he needs any further assistance in this matter. Patient will be seen in follow-up appointment in 9 months or earlier if the patient has any concerns.    Medication Adjustments/Labs and Tests Ordered: Current medicines are reviewed at length with the patient today.  Concerns regarding medicines are outlined above.  Orders Placed This Encounter  Procedures   EKG 12-Lead   No  orders of the defined types were placed in this encounter.    No chief complaint on file.    History of Present Illness:    Andrew Wiggins is a 67 y.o. male.  Patient has past medical history of peripheral vascular disease, AV block for which he has pacemaker, atherosclerotic vascular disease and smoking.  He denies any problems at this time and takes care of activities of daily living.  No chest pain orthopnea or PND.  At the time of my evaluation, the patient is alert awake oriented and in no distress.  Unfortunately continues to smoke.  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 ~  Jan11:  Last seen by Cards & pacer reprogrammed...     Cardiac pacemaker in situ    Carotid artery disease (HCC) 07/27/2010   R/O PERIPHERAL VASCULAR DISEASE (ICD-443.9) - on ASA 81mg /d... exam 11/09 shows gr 1-2 sys murmur LSB, but also has prominent bruit to base of right side of his neck w/ right > left Carotid Bruit... ~  CDopplers 11/09 showed severe plaque right ICA, & min plaque on left... no signif ICA stenoses per report. ~  f/u CDopplers 12/10  showed mild irreg plaque bilat in bulbs & intimal thickening in righ   Complete AV block (HCC)    history of   COPD (chronic obstructive pulmonary disease) with chronic bronchitis (HCC) 03/26/2008   CHRONIC OBSTRUCTIVE PULMONARY DISEASE (ICD-496) - hx recurrent bronchitic episodes in the past... smokes 1/2 - 1ppd x yrs... ~  PFT's 11/08 showed FVC= 3.85 (79%), FEV1= 2.19 (55%), %1sec= 57, mid-flows= 32% predicted:  all c/w mod airflow obstruction...  ~  CXR 12/10 showed left subclav pacer, mild hyperinflation, NAD...     Depression    Dermatitis, contact    due to  poison ivy   Diabetes mellitus (HCC) 02/04/2020   Diabetes mellitus with neuropathy (HCC) 12/20/2016   Dyslipidemia 03/06/2012   Encounter for prostate cancer screening 06/21/2022   Essential hypertension 04/10/2007    HYPERTENSION (ICD-401.9) - prev on Micardis 80mg /d then switched to Losartan 100mg /d, but he lost his job in 2011& couldn't afford Rx;  He called [place] 2/12 w/ this news & we phoned in Lisinopril 20mg /d... ~  2DEcho 11/09 showed sl incr LVwall thickness, norm LVF w/ EF= 55-60%, no regional wall motion abn, mild DD, mild MR... ~  Myoview 12/09 showed LBBB, no perfusion defects, abn septal motion & EF   GERD 03/26/2008    GERD (ICD-530.81) - prev mild GERD symptoms that improved after cholecystectomy on 2000... uses OTC PPI as needed... ~  he is due for routine screening colonoscopy...       Iron deficiency anemia due to chronic blood loss 06/21/2022   Mixed hyperlipidemia 06/19/2019   PVD (peripheral vascular disease) (HCC)    Sciatica 11/18/2019   Tobacco use 02/04/2020   Tobacco use disorder     Past Surgical History:  Procedure Laterality Date   ABDOMINAL AORTOGRAM W/LOWER EXTREMITY N/A 12/14/2020   Procedure: ABDOMINAL AORTOGRAM W/LOWER EXTREMITY;  Surgeon: Nada Libman, MD;  Location: [facility];  Service: Cardiovascular;  Laterality: N/A;   ABDOMINAL AORTOGRAM W/LOWER EXTREMITY N/A 05/24/2021   Procedure: ABDOMINAL AORTOGRAM W/LOWER EXTREMITY;  Surgeon: Nada Libman, MD;  Location: [facility];  Service: Cardiovascular;  Laterality: N/A;   ABDOMINAL AORTOGRAM W/LOWER EXTREMITY N/A 07/28/2022   Procedure: ABDOMINAL AORTOGRAM W/LOWER EXTREMITY;  Surgeon: [name], MD;  Location: [facility];  Service: Cardiovascular;  Laterality: N/A;   CATARACT EXTRACTION Bilateral    laproscopic cholecystectomy  05/08/1998   by Dr. Odie Sera   MOUTH SURGERY     PACEMAKER INSERTION  03/08/2008   by Dr. Ladona Ridgel   PERIPHERAL VASCULAR INTERVENTION Left  12/14/2020   Procedure: PERIPHERAL VASCULAR INTERVENTION;  Surgeon: Nada Libman, MD;  Location: [facility];  Service: Cardiovascular;  Laterality: Left;  SFA   PERIPHERAL VASCULAR INTERVENTION Left 05/24/2021   Procedure: PERIPHERAL VASCULAR INTERVENTION;  Surgeon: Nada Libman, MD;  Location: [facility];  Service: Cardiovascular;  Laterality: Left;   PERIPHERAL VASCULAR INTERVENTION Left 07/28/2022   Procedure: PERIPHERAL VASCULAR INTERVENTION;  Surgeon: [name], MD;  Location: [facility];  Service: Cardiovascular;  Laterality: Left;  Lt SFA   PPM GENERATOR CHANGEOUT N/A 05/25/2021   Procedure: PPM GENERATOR CHANGEOUT;  Surgeon: Regan Lemming, MD;  Location: [facility];  Service: Cardiovascular;  Laterality: N/A;    Current Medications: Current Meds  Medication Sig   albuterol (VENTOLIN HFA) 108 (90 Base) MCG/ACT inhaler INHALE 2 PUFFS BY MOUTH EVERY 6 HOURS AS NEEDED FOR WHEEZING OR SHORTNESS OF  BREATH   aspirin EC 81 MG tablet Take 81 mg by mouth daily. Swallow whole.   atorvastatin (LIPITOR) 20 MG tablet Take 1 tablet (20 mg total) by mouth every evening.   clopidogrel (PLAVIX) 75 MG tablet Take 1 tablet (75 mg total) by mouth every morning.   doxycycline (VIBRAMYCIN) 100 MG capsule Take 1 capsule (100 mg total) by mouth 2 (two) times daily.   FEROSUL 325 (65 Fe) MG tablet TAKE 1 TABLET BY MOUTH EVERY MORNING *REFILL REQUEST*   gabapentin (NEURONTIN) 300 MG capsule TAKE 2 CAPSULES BY MOUTH 3 TIMES DAILY *REFILL REQUEST*   glimepiride (AMARYL) 4 MG tablet TAKE 1 TABLET BY MOUTH EVERY MORNING *REFILL REQUEST*   metFORMIN (GLUCOPHAGE) 1000 MG tablet TAKE ONE TABLET BY MOUTH TWICE DAILY WITH A MEAL   Omega-3 Fatty Acids (FISH OIL) 1000 MG CAPS Take 2 capsules (2,000 mg total) by mouth 2 (two) times daily.   oxyCODONE-acetaminophen (PERCOCET) 7.5-325 MG tablet Take 1 tablet by mouth 3 (three) times daily for back pain.    oxyCODONE-acetaminophen (PERCOCET) 7.5-325 MG tablet Take 1 tablet by mouth 3 (three) times daily, for back pain.   oxyCODONE-acetaminophen (PERCOCET) 7.5-325 MG tablet Take 1 tablet by mouth 3 (three) times daily for back pain. (02/10/23)   oxyCODONE-acetaminophen (PERCOCET) 7.5-325 MG tablet Take 1 tablet by mouth 3 (three) times daily for back pain.   pantoprazole (PROTONIX) 40 MG tablet Take 40 mg by mouth every morning.   valsartan-hydrochlorothiazide (DIOVAN-HCT) 160-12.5 MG tablet TAKE 1 TABLET BY MOUTH EVERY MORNING *REFILL REQUEST*     Allergies:   Patient has no known allergies.   Social History   Socioeconomic History   Marital status: Legally Separated    Spouse name: Not on file   Number of children: 2   Years of education: 11   Highest education level: Not on file  Occupational History   Occupation: unemployed    Comment: works for himself   Occupation: RETIRED FROM [employer]  Tobacco Use   Smoking status: Every Day    Current packs/day: 0.50    Average packs/day: 0.5 packs/day for 25.0 years (12.5 ttl pk-yrs)    Types: Cigarettes    Passive exposure: Never   Smokeless tobacco: Never  Vaping Use   Vaping status: Never Used  Substance and Sexual Activity   Alcohol use: Not Currently    Alcohol/week: 0.0 standard drinks of alcohol   Drug use: No   Sexual activity: Not Currently  Other Topics Concern   Not on file  Social History Narrative   PT HAS VERY VAGUE FAMILY HISTORY - HE WAS RAISED IN [facility]   Social Determinants of Health   Financial Resource Strain: Low Risk  (12/21/2021)   Overall Financial Resource Strain (CARDIA)    Difficulty of Paying Living Expenses: Not hard at all  Food Insecurity: No Food Insecurity (06/30/2022)   Hunger Vital Sign    Worried About Running Out of Food in the Last Year: Never true    Ran Out of Food in the Last Year: Never true  Transportation Needs: No Transportation Needs (06/30/2022)   PRAPARE - Therapist, art (Medical): No    Lack of Transportation (Non-Medical): No  Physical Activity: Inactive (06/16/2020)   Exercise Vital Sign    Days of Exercise per Week: 0 days    Minutes of Exercise per Session: 30 min  Stress: Stress Concern Present (06/16/2020)   [employer] of Occupational Health -  Occupational Stress Questionnaire    Feeling of Stress : To some extent  Social Connections: Socially Isolated (06/16/2020)   Social Connection and Isolation Panel [NHANES]    Frequency of Communication with Friends and Family: Three times a week    Frequency of Social Gatherings with Friends and Family: Never    Attends Religious Services: Never    Database administrator or Organizations: No    Attends Engineer, structural: Never    Marital Status: Divorced     Family History: The patient's family history includes Heart disease in his brother; Leukemia in his brother.  ROS:   Please see the history of present illness.    All other systems reviewed and are negative.  EKGs/Labs/Other Studies Reviewed:    The following studies were reviewed today: .Marland KitchenEKG Interpretation Date/Time:  Tuesday March 20 2023 16:57:26 EST Ventricular Rate:  89 PR Interval:  156 QRS Duration:  156 QT Interval:  396 QTC Calculation: 481 R Axis:   119  Text Interpretation: Atrial-sensed ventricular-paced rhythm Abnormal ECG When compared with ECG of 14-Dec-2020 08:10, Vent. rate has decreased BY  17 BPM Confirmed by Belva Crome [phone]) on 03/20/2023 3:58:04 PM     Recent Labs: 06/21/2022: TSH 1.890 02/16/2023: ALT 21; BUN 18; Creatinine 0.97; Hemoglobin 13.3; Platelets 240; Potassium 4.4; Sodium 136  Recent Lipid Panel    Component Value Date/Time   CHOL 100 06/21/2022 0916   TRIG 157 (H) 06/21/2022 0916   HDL 33 (L) 06/21/2022 0916   CHOLHDL 3.0 06/21/2022 0916   CHOLHDL 5 12/20/2017 1320   VLDL 44.0 (H) 12/20/2017 1320   LDLCALC 40 06/21/2022 0916   LDLDIRECT 113.0  12/20/2017 1320    Physical Exam:    VS:  BP (!) 158/70   Pulse 89   Ht 5\' 10"  (1.778 m)   Wt 150 lb (68 kg)   SpO2 97%   BMI 21.52 kg/m     Wt Readings from Last 3 Encounters:  03/20/23 150 lb (68 kg)  02/16/23 149 lb 3.2 oz (67.7 kg)  12/18/22 150 lb (68 kg)     GEN: Patient is in no acute distress HEENT: Normal NECK: No JVD; No carotid bruits LYMPHATICS: No lymphadenopathy CARDIAC: Hear sounds regular, 2/6 systolic murmur at the apex. RESPIRATORY:  Clear to auscultation without rales, wheezing or rhonchi  ABDOMEN: Soft, non-tender, non-distended MUSCULOSKELETAL:  No edema; No deformity  SKIN: Warm and dry NEUROLOGIC:  Alert and oriented x 3 PSYCHIATRIC:  Normal affect   Signed, Garwin Brothers, MD  03/20/2023 4:15 PM    [facility]

## 2023-03-22 ENCOUNTER — Encounter: Payer: Self-pay | Admitting: Oncology

## 2023-03-23 ENCOUNTER — Inpatient Hospital Stay: Payer: PPO | Attending: Oncology

## 2023-03-23 VITALS — BP 157/79 | HR 89 | Temp 98.2°F | Resp 18 | Wt 150.0 lb

## 2023-03-23 DIAGNOSIS — D509 Iron deficiency anemia, unspecified: Secondary | ICD-10-CM | POA: Diagnosis not present

## 2023-03-23 DIAGNOSIS — D500 Iron deficiency anemia secondary to blood loss (chronic): Secondary | ICD-10-CM

## 2023-03-23 MED ORDER — SODIUM CHLORIDE 0.9 % IV SOLN
510.0000 mg | Freq: Once | INTRAVENOUS | Status: AC
  Administered 2023-03-23: 510 mg via INTRAVENOUS
  Filled 2023-03-23: qty 510

## 2023-03-23 MED ORDER — SODIUM CHLORIDE 0.9 % IV SOLN: INTRAVENOUS | Status: DC

## 2023-03-23 MED ORDER — SODIUM CHLORIDE 0.9% FLUSH: 10.0000 mL | Freq: Two times a day (BID) | INTRAVENOUS | Status: DC

## 2023-03-23 NOTE — Patient Instructions (Signed)
 Ferumoxytol Injection What is this medication? FERUMOXYTOL (FER ue MOX i tol) treats low levels of iron in your body (iron deficiency anemia). Iron is a mineral that plays an important role in making red blood cells, which carry oxygen from your lungs to the rest of your body. This medicine may be used for other purposes; ask your health care provider or pharmacist if you have questions. COMMON BRAND NAME(S): Feraheme What should I tell my care team before I take this medication? They need to know if you have any of these conditions: Anemia not caused by low iron levels High levels of iron in the blood Magnetic resonance imaging (MRI) test scheduled An unusual or allergic reaction to iron, other medications, foods, dyes, or preservatives Pregnant or trying to get pregnant Breastfeeding How should I use this medication? This medication is injected into a vein. It is given by your care team in a hospital or clinic setting. Talk to your care team the use of this medication in children. Special care may be needed. Overdosage: If you think you have taken too much of this medicine contact a poison control center or emergency room at once. NOTE: This medicine is only for you. Do not share this medicine with others. What if I miss a dose? It is important not to miss your dose. Call your care team if you are unable to keep an appointment. What may interact with this medication? Other iron products This list may not describe all possible interactions. Give your health care provider a list of all the medicines, herbs, non-prescription drugs, or dietary supplements you use. Also tell them if you smoke, drink alcohol, or use illegal drugs. Some items may interact with your medicine. What should I watch for while using this medication? Visit your care team regularly. Tell your care team if your symptoms do not start to get better or if they get worse. You may need blood work done while you are taking this  medication. You may need to follow a special diet. Talk to your care team. Foods that contain iron include: whole grains/cereals, dried fruits, beans, or peas, leafy green vegetables, and organ meats (liver, kidney). What side effects may I notice from receiving this medication? Side effects that you should report to your care team as soon as possible: Allergic reactions--skin rash, itching, hives, swelling of the face, lips, tongue, or throat Low blood pressure--dizziness, feeling faint or lightheaded, blurry vision Shortness of breath Side effects that usually do not require medical attention (report to your care team if they continue or are bothersome): Flushing Headache Joint pain Muscle pain Nausea Pain, redness, or irritation at injection site This list may not describe all possible side effects. Call your doctor for medical advice about side effects. You may report side effects to FDA at 1-800-FDA-1088. Where should I keep my medication? This medication is given in a hospital or clinic. It will not be stored at home. NOTE: This sheet is a summary. It may not cover all possible information. If you have questions about this medicine, talk to your doctor, pharmacist, or health care provider.  2024 Elsevier/Gold Standard (2022-09-29 00:00:00)

## 2023-03-28 ENCOUNTER — Inpatient Hospital Stay: Payer: PPO

## 2023-03-28 VITALS — BP 160/76 | HR 93 | Temp 98.6°F | Resp 16

## 2023-03-28 DIAGNOSIS — D500 Iron deficiency anemia secondary to blood loss (chronic): Secondary | ICD-10-CM

## 2023-03-28 DIAGNOSIS — D509 Iron deficiency anemia, unspecified: Secondary | ICD-10-CM | POA: Diagnosis not present

## 2023-03-28 MED ORDER — FERUMOXYTOL INJECTION 510 MG/17 ML
510.0000 mg | Freq: Once | INTRAVENOUS | Status: AC
  Administered 2023-03-28: 510 mg via INTRAVENOUS
  Filled 2023-03-28: qty 510

## 2023-03-28 MED ORDER — SODIUM CHLORIDE 0.9 % IV SOLN: INTRAVENOUS | Status: DC

## 2023-03-28 MED ORDER — SODIUM CHLORIDE 0.9% FLUSH: 10.0000 mL | Freq: Two times a day (BID) | INTRAVENOUS | Status: DC

## 2023-03-28 NOTE — Patient Instructions (Signed)
 Ferumoxytol Injection What is this medication? FERUMOXYTOL (FER ue MOX i tol) treats low levels of iron in your body (iron deficiency anemia). Iron is a mineral that plays an important role in making red blood cells, which carry oxygen from your lungs to the rest of your body. This medicine may be used for other purposes; ask your health care provider or pharmacist if you have questions. COMMON BRAND NAME(S): Feraheme What should I tell my care team before I take this medication? They need to know if you have any of these conditions: Anemia not caused by low iron levels High levels of iron in the blood Magnetic resonance imaging (MRI) test scheduled An unusual or allergic reaction to iron, other medications, foods, dyes, or preservatives Pregnant or trying to get pregnant Breastfeeding How should I use this medication? This medication is injected into a vein. It is given by your care team in a hospital or clinic setting. Talk to your care team the use of this medication in children. Special care may be needed. Overdosage: If you think you have taken too much of this medicine contact a poison control center or emergency room at once. NOTE: This medicine is only for you. Do not share this medicine with others. What if I miss a dose? It is important not to miss your dose. Call your care team if you are unable to keep an appointment. What may interact with this medication? Other iron products This list may not describe all possible interactions. Give your health care provider a list of all the medicines, herbs, non-prescription drugs, or dietary supplements you use. Also tell them if you smoke, drink alcohol, or use illegal drugs. Some items may interact with your medicine. What should I watch for while using this medication? Visit your care team regularly. Tell your care team if your symptoms do not start to get better or if they get worse. You may need blood work done while you are taking this  medication. You may need to follow a special diet. Talk to your care team. Foods that contain iron include: whole grains/cereals, dried fruits, beans, or peas, leafy green vegetables, and organ meats (liver, kidney). What side effects may I notice from receiving this medication? Side effects that you should report to your care team as soon as possible: Allergic reactions--skin rash, itching, hives, swelling of the face, lips, tongue, or throat Low blood pressure--dizziness, feeling faint or lightheaded, blurry vision Shortness of breath Side effects that usually do not require medical attention (report to your care team if they continue or are bothersome): Flushing Headache Joint pain Muscle pain Nausea Pain, redness, or irritation at injection site This list may not describe all possible side effects. Call your doctor for medical advice about side effects. You may report side effects to FDA at 1-800-FDA-1088. Where should I keep my medication? This medication is given in a hospital or clinic. It will not be stored at home. NOTE: This sheet is a summary. It may not cover all possible information. If you have questions about this medicine, talk to your doctor, pharmacist, or health care provider.  2024 Elsevier/Gold Standard (2022-09-29 00:00:00)

## 2023-04-03 ENCOUNTER — Other Ambulatory Visit (HOSPITAL_COMMUNITY): Payer: Self-pay

## 2023-04-03 ENCOUNTER — Other Ambulatory Visit (HOSPITAL_BASED_OUTPATIENT_CLINIC_OR_DEPARTMENT_OTHER): Payer: Self-pay

## 2023-04-03 DIAGNOSIS — G894 Chronic pain syndrome: Secondary | ICD-10-CM | POA: Diagnosis not present

## 2023-04-03 DIAGNOSIS — M25552 Pain in left hip: Secondary | ICD-10-CM | POA: Diagnosis not present

## 2023-04-03 DIAGNOSIS — M47816 Spondylosis without myelopathy or radiculopathy, lumbar region: Secondary | ICD-10-CM | POA: Diagnosis not present

## 2023-04-03 DIAGNOSIS — M549 Dorsalgia, unspecified: Secondary | ICD-10-CM | POA: Diagnosis not present

## 2023-04-03 DIAGNOSIS — M48061 Spinal stenosis, lumbar region without neurogenic claudication: Secondary | ICD-10-CM | POA: Diagnosis not present

## 2023-04-03 DIAGNOSIS — M47818 Spondylosis without myelopathy or radiculopathy, sacral and sacrococcygeal region: Secondary | ICD-10-CM | POA: Diagnosis not present

## 2023-04-03 DIAGNOSIS — M169 Osteoarthritis of hip, unspecified: Secondary | ICD-10-CM | POA: Diagnosis not present

## 2023-04-03 DIAGNOSIS — Z79891 Long term (current) use of opiate analgesic: Secondary | ICD-10-CM | POA: Diagnosis not present

## 2023-04-03 DIAGNOSIS — Z1389 Encounter for screening for other disorder: Secondary | ICD-10-CM | POA: Diagnosis not present

## 2023-04-03 MED ORDER — OXYCODONE-ACETAMINOPHEN 7.5-325 MG PO TABS
1.0000 | ORAL_TABLET | Freq: Three times a day (TID) | ORAL | 0 refills | Status: DC
  Filled 2023-04-03 – 2023-04-16 (×3): qty 90, 30d supply, fill #0

## 2023-04-10 ENCOUNTER — Other Ambulatory Visit (HOSPITAL_COMMUNITY): Payer: Self-pay

## 2023-04-13 ENCOUNTER — Other Ambulatory Visit (HOSPITAL_COMMUNITY): Payer: Self-pay

## 2023-04-13 ENCOUNTER — Other Ambulatory Visit (HOSPITAL_BASED_OUTPATIENT_CLINIC_OR_DEPARTMENT_OTHER): Payer: Self-pay

## 2023-04-16 ENCOUNTER — Other Ambulatory Visit (HOSPITAL_BASED_OUTPATIENT_CLINIC_OR_DEPARTMENT_OTHER): Payer: Self-pay

## 2023-04-17 ENCOUNTER — Other Ambulatory Visit: Payer: Self-pay | Admitting: Family Medicine

## 2023-04-17 DIAGNOSIS — I739 Peripheral vascular disease, unspecified: Secondary | ICD-10-CM

## 2023-04-17 DIAGNOSIS — E782 Mixed hyperlipidemia: Secondary | ICD-10-CM

## 2023-04-18 ENCOUNTER — Telehealth: Payer: Self-pay

## 2023-04-18 NOTE — Telephone Encounter (Signed)
I called the patient today to see about getting an appointment scheduled for chronic f.up fasting. Looks like this patient is currently overdue for an appointment. I left a message for the patient to call the office back.  Please schedule this patient with any of our new providers.  NOTE: If the patient does not call back within a week to schedule this appointment, the front staff will mail the patient a letter requesting to call the office back.

## 2023-04-18 NOTE — Telephone Encounter (Signed)
-----   Message from Blane Ohara sent at 04/17/2023  9:17 PM EST ----- Regarding: pt needs an appointment. pt needs an appointment. Schedule with any of new providers.  Dr. Sedalia Muta

## 2023-04-25 NOTE — Telephone Encounter (Signed)
 I have mailed the patient a letter requesting him to call the office to make an appointment.

## 2023-05-04 ENCOUNTER — Telehealth: Payer: Self-pay

## 2023-05-04 NOTE — Telephone Encounter (Signed)
Pt's significant other, Meriam Sprague, called stating that the pt's L foot is red and has spread up to the ankles. She states that "it looks infected."  Reviewed pt's chart, returned call for clarification, no answer, lf vm.  Pt returned call.  Caller: Patient  Concern: Redness from toes up to ankle, swollen, sore d/t edema, rash-like itching  Pt denies any open sores, blisters, or fever  Location: left foot  Description:  x 1 wk  Treatments:  Instructed pt to use cool compresses and protect foot  Resolution: Instructed patient to contact primary care MD and go to urgent care if symptoms worsen over the weekend or he gets a fever  Next Appt: Appointment scheduled for 05/07/23 @ 1345 with PA

## 2023-05-07 ENCOUNTER — Encounter: Payer: Self-pay | Admitting: Physician Assistant

## 2023-05-07 ENCOUNTER — Ambulatory Visit: Payer: PPO

## 2023-05-07 VITALS — BP 177/84 | HR 115 | Temp 98.7°F | Wt 151.0 lb

## 2023-05-07 DIAGNOSIS — I739 Peripheral vascular disease, unspecified: Secondary | ICD-10-CM | POA: Diagnosis not present

## 2023-05-07 MED ORDER — CEPHALEXIN 500 MG PO CAPS: 500.0000 mg | ORAL_CAPSULE | Freq: Three times a day (TID) | ORAL | 0 refills | Status: DC

## 2023-05-07 NOTE — Progress Notes (Signed)
[facility] [place] HISTORY AND PHYSICAL   History of Present Illness:  Patient is a 67 y.o. year old male who presents for evaluation of erythema.  He has a history of Left superficial femoral artery stenting for a toe ulcer 12/14/20 and left SFA DCB for rest pain  05/24/21.  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 He underwent angiogram by Dr. Lenell Antu on 07/28/2022 at that time he placed a new stent in the SFA  he had approximately a 95% restenosis.     He states he has had erythema and a cold feeling in the left LE.  He states he woke up with pain in the right LE and had to walk around then he was able to fall back asleep. Since he has a history of left recurrent stent stenosis he was worried and asked to be seen.  Patient is a current smoker.  He has COPD.  He takes a statin for hypercholesterolemia.  He is medically managed for hypertension.  He has type 2 diabetes complicated by neuropathy.   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 ~  Jan11:  Last seen by Cards & pacer reprogrammed...     Cardiac pacemaker in situ    Carotid artery disease (HCC) 07/27/2010   R/O PERIPHERAL VASCULAR DISEASE (ICD-443.9) - on ASA 81mg /d... exam 11/09 shows gr 1-2 sys murmur LSB, but also has prominent bruit to base of right side of his neck w/ right > left Carotid Bruit... ~  CDopplers 11/09 showed severe plaque right ICA, & min plaque on left... no signif ICA stenoses per report. ~  f/u CDopplers 12/10 showed mild irreg plaque bilat in bulbs & intimal thickening in righ   Complete AV block (HCC)    history of   COPD (chronic  obstructive pulmonary disease) with chronic bronchitis (HCC) 03/26/2008   CHRONIC OBSTRUCTIVE PULMONARY DISEASE (ICD-496) - hx recurrent bronchitic episodes in the past... smokes 1/2 - 1ppd x yrs... ~  PFT's 11/08 showed FVC= 3.85 (79%), FEV1= 2.19 (55%), %1sec= 57, mid-flows= 32% predicted:  all c/w mod airflow obstruction...  ~  CXR 12/10 showed left subclav pacer, mild hyperinflation, NAD...     Depression    Dermatitis, contact    due to poison ivy   Diabetes mellitus (HCC) 02/04/2020   Diabetes mellitus with neuropathy (HCC) 12/20/2016   Dyslipidemia 03/06/2012   Encounter for prostate cancer screening 06/21/2022   Essential hypertension 04/10/2007    HYPERTENSION (ICD-401.9) - prev on Micardis 80mg /d then switched to Losartan 100mg /d, but he lost his job in 2011& couldn't afford Rx;  He called [place] 2/12 w/ this news & we phoned in Lisinopril 20mg /d... ~  2DEcho 11/09 showed sl incr LVwall thickness, norm LVF w/ EF= 55-60%, no regional wall motion abn, mild DD, mild MR... ~  Myoview 12/09 showed LBBB, no perfusion defects, abn septal motion & EF   GERD 03/26/2008    GERD (ICD-530.81) - prev mild GERD symptoms that improved after cholecystectomy on 2000... uses OTC PPI as needed... ~  he is due for routine  screening colonoscopy...       Iron deficiency anemia due to chronic blood loss 06/21/2022   Mixed hyperlipidemia 06/19/2019   PVD (peripheral vascular disease) (HCC)    Sciatica 11/18/2019   Tobacco use 02/04/2020   Tobacco use disorder     Past Surgical History:  Procedure Laterality Date   ABDOMINAL AORTOGRAM W/LOWER EXTREMITY N/A 12/14/2020   Procedure: ABDOMINAL AORTOGRAM W/LOWER EXTREMITY;  Surgeon: Nada Libman, MD;  Location: [facility];  Service: Cardiovascular;  Laterality: N/A;   ABDOMINAL AORTOGRAM W/LOWER EXTREMITY N/A 05/24/2021   Procedure: ABDOMINAL AORTOGRAM W/LOWER EXTREMITY;  Surgeon: Nada Libman, MD;  Location: [facility];  Service:  Cardiovascular;  Laterality: N/A;   ABDOMINAL AORTOGRAM W/LOWER EXTREMITY N/A 07/28/2022   Procedure: ABDOMINAL AORTOGRAM W/LOWER EXTREMITY;  Surgeon: [name], MD;  Location: [facility];  Service: Cardiovascular;  Laterality: N/A;   CATARACT EXTRACTION Bilateral    laproscopic cholecystectomy  05/08/1998   by Dr. Odie Sera   MOUTH SURGERY     PACEMAKER INSERTION  03/08/2008   by Dr. Ladona Ridgel   PERIPHERAL VASCULAR INTERVENTION Left 12/14/2020   Procedure: PERIPHERAL VASCULAR INTERVENTION;  Surgeon: Nada Libman, MD;  Location: [facility];  Service: Cardiovascular;  Laterality: Left;  SFA   PERIPHERAL VASCULAR INTERVENTION Left 05/24/2021   Procedure: PERIPHERAL VASCULAR INTERVENTION;  Surgeon: Nada Libman, MD;  Location: [facility];  Service: Cardiovascular;  Laterality: Left;   PERIPHERAL VASCULAR INTERVENTION Left 07/28/2022   Procedure: PERIPHERAL VASCULAR INTERVENTION;  Surgeon: [name], MD;  Location: [facility];  Service: Cardiovascular;  Laterality: Left;  Lt SFA   PPM GENERATOR CHANGEOUT N/A 05/25/2021   Procedure: PPM GENERATOR CHANGEOUT;  Surgeon: Regan Lemming, MD;  Location: [facility];  Service: Cardiovascular;  Laterality: N/A;    ROS:   General:  No weight loss, Fever, chills  HEENT: No recent headaches, no nasal bleeding, no visual changes, no sore throat  Neurologic: No dizziness, blackouts, seizures. No recent symptoms of stroke or mini- stroke. No recent episodes of slurred speech, or temporary blindness.  Cardiac: No recent episodes of chest pain/pressure, no shortness of breath at rest.  No shortness of breath with exertion.  Denies history of atrial fibrillation or irregular heartbeat  Vascular: No history of rest pain in feet.  No history of claudication.  No history of non-healing ulcer, No history of DVT   Pulmonary: No home oxygen, no productive cough, no hemoptysis,  No asthma or  wheezing  Musculoskeletal:  [ ]  Arthritis, [ ]  Low back pain,  [ ]  Joint pain  Hematologic:No history of hypercoagulable state.  No history of easy bleeding.  No history of anemia  Gastrointestinal: No hematochezia or melena,  No gastroesophageal reflux, no trouble swallowing  Urinary: [ ]  chronic Kidney disease, [ ]  on HD - [ ]  MWF or [ ]  TTHS, [ ]  Burning with urination, [ ]  Frequent urination, [ ]  Difficulty urinating;   Skin: No rashes  Psychological: No history of anxiety,  No history of depression  Social History Social History   Tobacco Use   Smoking status: Every Day    Current packs/day: 0.50    Average packs/day: 0.5 packs/day for 25.0 years (12.5 ttl pk-yrs)    Types: Cigarettes    Passive exposure: Never   Smokeless tobacco: Never  Vaping Use   Vaping status: Never Used  Substance Use Topics   Alcohol use:  Not Currently    Alcohol/week: 0.0 standard drinks of alcohol   Drug use: No    Family History Family History  Problem Relation Age of Onset   Heart disease Brother    Leukemia Brother     Allergies  No Known Allergies   Current Outpatient Medications  Medication Sig Dispense Refill   albuterol (VENTOLIN HFA) 108 (90 Base) MCG/ACT inhaler INHALE 2 PUFFS BY MOUTH EVERY 6 HOURS AS NEEDED FOR WHEEZING OR SHORTNESS OF BREATH 18 g 0   aspirin EC 81 MG tablet Take 81 mg by mouth daily. Swallow whole.     atorvastatin (LIPITOR) 20 MG tablet Take 1 tablet (20 mg total) by mouth every evening. 90 tablet 1   clopidogrel (PLAVIX) 75 MG tablet Take 1 tablet (75 mg total) by mouth every morning. 90 tablet 1   doxycycline (VIBRAMYCIN) 100 MG capsule Take 1 capsule (100 mg total) by mouth 2 (two) times daily. 14 capsule 0   FEROSUL 325 (65 Fe) MG tablet TAKE 1 TABLET BY MOUTH EVERY MORNING *REFILL REQUEST* 30 tablet 10   gabapentin (NEURONTIN) 300 MG capsule TAKE 2 CAPSULES BY MOUTH 3 TIMES DAILY *REFILL REQUEST* 180 capsule 10   glimepiride (AMARYL) 4 MG tablet  TAKE 1 TABLET BY MOUTH EVERY MORNING *REFILL REQUEST* 30 tablet 10   metFORMIN (GLUCOPHAGE) 1000 MG tablet TAKE ONE TABLET BY MOUTH TWICE DAILY WITH A MEAL 180 tablet 1   Omega-3 Fatty Acids (FISH OIL) 1000 MG CAPS Take 2 capsules (2,000 mg total) by mouth 2 (two) times daily. 180 capsule 12   oxyCODONE-acetaminophen (PERCOCET) 7.5-325 MG tablet Take 1 tablet by mouth 3 (three) times daily for back pain. 90 tablet 0   oxyCODONE-acetaminophen (PERCOCET) 7.5-325 MG tablet Take 1 tablet by mouth 3 (three) times daily, for back pain. 90 tablet 0   oxyCODONE-acetaminophen (PERCOCET) 7.5-325 MG tablet Take 1 tablet by mouth 3 (three) times daily for back pain. (02/10/23) 90 tablet 0   oxyCODONE-acetaminophen (PERCOCET) 7.5-325 MG tablet Take 1 tablet by mouth 3 (three) times daily for back pain. 90 tablet 0   oxyCODONE-acetaminophen (PERCOCET) 7.5-325 MG tablet Take 1 tablet by mouth 3 (three) times daily for back pain. 90 tablet 0   pantoprazole (PROTONIX) 40 MG tablet Take 40 mg by mouth every morning.     valsartan-hydrochlorothiazide (DIOVAN-HCT) 160-12.5 MG tablet TAKE 1 TABLET BY MOUTH EVERY MORNING *REFILL REQUEST* 30 tablet 10   No current facility-administered medications for this visit.    Physical Examination  Vitals:   05/07/23 1332  BP: (!) 177/84  Pulse: (!) 115  Temp: 98.7 F (37.1 C)  TempSrc: Temporal  SpO2: 93%  Weight: 151 lb (68.5 kg)    Body mass index is 21.67 kg/m.  General:  Alert and oriented, no acute distress HEENT: Normal Neck: No bruit or JVD Pulmonary: Clear to auscultation bilaterally Cardiac: Regular Rate and Rhythm without murmur Abdomen: Soft, non-tender, non-distended, no mass, no scars Skin: No rash  Extremity Pulses:  biphasic DP/PT doppler signals, cap refill < 5 sec Dependent rubor that improves with elevation.  Palpable left femoral pulse.  No open wounds.  Skin is warm to palpation.    Musculoskeletal: minimal right LE edema  Neurologic:  Upper and lower extremity motor grossly and symmetric      ASSESSMENT/PLAN:  PAD with history of non healing left toe wound  S/P left SFA stent 12/14/20, DCB angioplasty left SFA 05/24/21, and Left superficial femoral artery angioplasty stenting 07/28/22 by  Dr. Lenell Antu.    On exam he has biphasic doppler signals DP/PT, palpable femoral pulses.  With symptoms of rest pain and repeated angiograms for stent stenosis he will need studies.  He has a 2 weeks history of cellulitis with an episode of rest pain.  I will start him on Keflex 500 mg TID and bring him back in the next week to get arterial duplex and ABI's.  If he develops more frequent rest pain he may need endovascular repeated angiogram to improve the inflow again.            Mosetta Pigeon PA-C [facility] [place] Office: [phone]  MD on call Hetty Blend

## 2023-05-16 ENCOUNTER — Other Ambulatory Visit (HOSPITAL_BASED_OUTPATIENT_CLINIC_OR_DEPARTMENT_OTHER): Payer: Self-pay

## 2023-05-16 DIAGNOSIS — M47816 Spondylosis without myelopathy or radiculopathy, lumbar region: Secondary | ICD-10-CM | POA: Diagnosis not present

## 2023-05-16 DIAGNOSIS — M47818 Spondylosis without myelopathy or radiculopathy, sacral and sacrococcygeal region: Secondary | ICD-10-CM | POA: Diagnosis not present

## 2023-05-16 DIAGNOSIS — M549 Dorsalgia, unspecified: Secondary | ICD-10-CM | POA: Diagnosis not present

## 2023-05-16 DIAGNOSIS — Z1389 Encounter for screening for other disorder: Secondary | ICD-10-CM | POA: Diagnosis not present

## 2023-05-16 DIAGNOSIS — Z79891 Long term (current) use of opiate analgesic: Secondary | ICD-10-CM | POA: Diagnosis not present

## 2023-05-16 DIAGNOSIS — G894 Chronic pain syndrome: Secondary | ICD-10-CM | POA: Diagnosis not present

## 2023-05-16 DIAGNOSIS — M169 Osteoarthritis of hip, unspecified: Secondary | ICD-10-CM | POA: Diagnosis not present

## 2023-05-16 DIAGNOSIS — M25552 Pain in left hip: Secondary | ICD-10-CM | POA: Diagnosis not present

## 2023-05-16 DIAGNOSIS — M48061 Spinal stenosis, lumbar region without neurogenic claudication: Secondary | ICD-10-CM | POA: Diagnosis not present

## 2023-05-16 MED ORDER — OXYCODONE-ACETAMINOPHEN 7.5-325 MG PO TABS
1.0000 | ORAL_TABLET | Freq: Three times a day (TID) | ORAL | 0 refills | Status: DC
  Filled 2023-05-16: qty 90, 30d supply, fill #0

## 2023-05-25 ENCOUNTER — Ambulatory Visit: Payer: PPO

## 2023-05-25 DIAGNOSIS — I442 Atrioventricular block, complete: Secondary | ICD-10-CM | POA: Diagnosis not present

## 2023-05-25 LAB — CUP PACEART REMOTE DEVICE CHECK
Battery Remaining Longevity: 135 mo
Battery Voltage: 3.02 V
Brady Statistic AP VP Percent: 1.17 %
Brady Statistic AP VS Percent: 0 %
Brady Statistic AS VP Percent: 98.74 %
Brady Statistic AS VS Percent: 0.09 %
Brady Statistic RA Percent Paced: 1.19 %
Brady Statistic RV Percent Paced: 99.91 %
Date Time Interrogation Session: 20250117013115
Implantable Lead Connection Status: 753985
Implantable Lead Connection Status: 753985
Implantable Lead Implant Date: 20091120
Implantable Lead Implant Date: 20091120
Implantable Lead Location: 753859
Implantable Lead Location: 753860
Implantable Lead Model: 5076
Implantable Lead Model: 5076
Implantable Pulse Generator Implant Date: 20230118
Lead Channel Impedance Value: 323 Ohm
Lead Channel Impedance Value: 437 Ohm
Lead Channel Impedance Value: 760 Ohm
Lead Channel Impedance Value: 798 Ohm
Lead Channel Pacing Threshold Amplitude: 0.75 V
Lead Channel Pacing Threshold Amplitude: 0.875 V
Lead Channel Pacing Threshold Pulse Width: 0.4 ms
Lead Channel Pacing Threshold Pulse Width: 0.4 ms
Lead Channel Sensing Intrinsic Amplitude: 2.5 mV
Lead Channel Sensing Intrinsic Amplitude: 2.5 mV
Lead Channel Sensing Intrinsic Amplitude: 6.75 mV
Lead Channel Setting Pacing Amplitude: 1.5 V
Lead Channel Setting Pacing Amplitude: 2 V
Lead Channel Setting Pacing Pulse Width: 0.4 ms
Lead Channel Setting Sensing Sensitivity: 0.9 mV
Zone Setting Status: 755011
Zone Setting Status: 755011

## 2023-05-28 ENCOUNTER — Encounter: Payer: Self-pay | Admitting: Internal Medicine

## 2023-06-04 ENCOUNTER — Ambulatory Visit: Payer: PPO | Admitting: Physician Assistant

## 2023-06-04 ENCOUNTER — Ambulatory Visit (HOSPITAL_COMMUNITY)
Admission: RE | Admit: 2023-06-04 | Discharge: 2023-06-04 | Disposition: A | Discharge status: Home / Self Care | Payer: PPO | Source: Ambulatory Visit | Attending: Surgery | Admitting: Surgery

## 2023-06-04 ENCOUNTER — Ambulatory Visit (INDEPENDENT_AMBULATORY_CARE_PROVIDER_SITE_OTHER)
Admission: RE | Admit: 2023-06-04 | Discharge: 2023-06-04 | Disposition: A | Discharge status: Home / Self Care | Payer: PPO | Source: Ambulatory Visit | Attending: Surgery | Admitting: Surgery

## 2023-06-04 ENCOUNTER — Telehealth: Payer: Self-pay

## 2023-06-04 VITALS — BP 179/85 | HR 100 | Temp 97.6°F | Resp 20 | Ht 70.0 in | Wt 154.8 lb

## 2023-06-04 DIAGNOSIS — I739 Peripheral vascular disease, unspecified: Secondary | ICD-10-CM

## 2023-06-04 DIAGNOSIS — I70222 Atherosclerosis of native arteries of extremities with rest pain, left leg: Secondary | ICD-10-CM | POA: Diagnosis not present

## 2023-06-04 LAB — VAS US ABI WITH/WO TBI
Left ABI: 0.3
Right ABI: 0.78

## 2023-06-04 NOTE — Telephone Encounter (Signed)
Attempted to reach pt to schedule his procedure for this week. Left VM for him to return our call.

## 2023-06-04 NOTE — H&P (View-Only) (Signed)
 HISTORY AND PHYSICAL     CC:  follow up. Requesting Provider:  Blane Ohara, MD  HPI: This is a 68 y.o. male who is here today for follow up for PAD.  Pt has hx of left SFA stenting for toe ulcer in January 2023 and left SFA drug coated balloon angioplasty for rest pain by Dr. Myra Gianotti.  On 07/28/2022, he underwent angiogram with left SFA angioplasty and stenting for LLE ulceration by Dr. Lenell Antu.  Pt was last seen 05/07/2023 and at that time, he was having some erythema and cold feeling in the left foot.  He woke up with right leg  pain. He was found to have some cellulitis and was started on Keflex and brought back for studies.   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   The pt returns today for follow up.  He states that he took two weeks of abx and he feels the redness got better and has started getting red again.  He does not have any sores on his feet, but at night he does get numbness in his foot and it feels better to hang his foot off the bed and put on the cold floor.  He states the redness gets better when his leg is elevated and gets worse when his foot is hanging down.  He states that he does get some burning in the 4th and 5th toes. He states in the past 2-3 days has started getting severe cramping in the left calf with walking.  He does not think he could walk a city block due to this. He states this stent has been working for 10 months.   He does continue to smoke but says he is trying to quit.    is compliant with his asa/statin/plavix.   He has hx of PPM.   The pt is on a statin for cholesterol management.    The pt is on an aspirin.    Other AC:  Plavix The pt is on ARB, diuretic for hypertension.  The pt is is on medication for diabetes. Tobacco hx:  current  Pt does not have family hx of AAA.  Past Medical History:  Diagnosis Date   Allergic rhinitis 09/04/2012   Anemia    Arthritis    AV block    history of   Cardiac conduction disorder 11/26/2008     Hx of AV BLOCK (ICD-426.9) & CARDIAC PACEMAKER IN SITU (ICD-V45.01) - he was adm 1/10 w/ symptomatic bradycardias & 2:1 heart block requiring pacemaker- followed by DrTaylor... ~  Jan11:  Last seen by Cards & pacer reprogrammed...     Cardiac pacemaker in situ    Carotid artery disease (HCC) 07/27/2010   R/O PERIPHERAL VASCULAR DISEASE (ICD-443.9) - on ASA 81mg /d... exam 11/09 shows gr 1-2 sys murmur LSB, but also has prominent bruit to base of right side of his neck w/ right > left Carotid Bruit... ~  CDopplers 11/09 showed severe plaque right ICA, & min plaque on left... no signif ICA stenoses per report. 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 smokes 1/2 - 1ppd x yrs... ~  PFT's 11/08 showed FVC= 3.85 (79%), FEV1= 2.19 (55%), %1sec= 57, mid-flows= 32% predicted:  all c/w mod airflow obstruction...  ~  CXR 12/10 showed left subclav pacer, mild hyperinflation, NAD...     Depression    Dermatitis, contact    due to poison ivy   Diabetes mellitus (HCC) 02/04/2020   Diabetes mellitus with neuropathy (HCC) 12/20/2016   Dyslipidemia 03/06/2012   Encounter for prostate cancer screening 06/21/2022   Essential hypertension 04/10/2007    HYPERTENSION (ICD-401.9) - prev on Micardis 80mg /d then switched to Losartan 100mg /d, but he lost his job in 2011& couldn't afford Rx;  He called [place] 2/12 w/ this news & we phoned in Lisinopril 20mg /d... ~  2DEcho 11/09 showed sl incr LVwall thickness, norm LVF w/ EF= 55-60%, no regional wall motion abn, mild DD, mild MR... ~  Myoview 12/09 showed LBBB, no perfusion defects, abn septal motion & EF   GERD 03/26/2008    GERD (ICD-530.81) - prev mild GERD symptoms that improved after cholecystectomy on  2000... uses OTC PPI as needed... ~  he is due for routine screening colonoscopy...       Iron deficiency anemia due to chronic blood loss 06/21/2022   Mixed hyperlipidemia 06/19/2019   PVD (peripheral vascular disease) (HCC)    Sciatica 11/18/2019   Tobacco use 02/04/2020   Tobacco use disorder     Past Surgical History:  Procedure Laterality Date   ABDOMINAL AORTOGRAM W/LOWER EXTREMITY N/A 12/14/2020   Procedure: ABDOMINAL AORTOGRAM W/LOWER EXTREMITY;  Surgeon: Nada Libman, MD;  Location: [facility];  Service: Cardiovascular;  Laterality: N/A;   ABDOMINAL AORTOGRAM W/LOWER EXTREMITY N/A 05/24/2021   Procedure: ABDOMINAL AORTOGRAM W/LOWER EXTREMITY;  Surgeon: Nada Libman, MD;  Location: [facility];  Service: Cardiovascular;  Laterality: N/A;   ABDOMINAL AORTOGRAM W/LOWER EXTREMITY N/A 07/28/2022   Procedure: ABDOMINAL AORTOGRAM W/LOWER EXTREMITY;  Surgeon: [name], MD;  Location: [facility];  Service: Cardiovascular;  Laterality: N/A;   CATARACT EXTRACTION Bilateral    laproscopic cholecystectomy  05/08/1998   by Dr. Odie Sera   MOUTH SURGERY     PACEMAKER INSERTION  03/08/2008   by Dr. Ladona Ridgel   PERIPHERAL VASCULAR INTERVENTION Left 12/14/2020   Procedure: PERIPHERAL VASCULAR INTERVENTION;  Surgeon: Nada Libman, MD;  Location: [facility];  Service: Cardiovascular;  Laterality: Left;  SFA   PERIPHERAL VASCULAR INTERVENTION Left 05/24/2021   Procedure: PERIPHERAL VASCULAR INTERVENTION;  Surgeon: Nada Libman, MD;  Location: [facility];  Service: Cardiovascular;  Laterality: Left;   PERIPHERAL VASCULAR INTERVENTION Left 07/28/2022   Procedure: PERIPHERAL VASCULAR INTERVENTION;  Surgeon: [name], MD;  Location: [facility];  Service: Cardiovascular;  Laterality: Left;  Lt SFA   PPM GENERATOR CHANGEOUT N/A 05/25/2021   Procedure: PPM GENERATOR CHANGEOUT;  Surgeon: Regan Lemming, MD;  Location: [facility];  Service: Cardiovascular;  Laterality: N/A;    No Known Allergies  Current Outpatient Medications  Medication Sig Dispense Refill   albuterol (VENTOLIN HFA) 108 (90 Base) MCG/ACT inhaler INHALE 2 PUFFS BY MOUTH EVERY 6 HOURS AS NEEDED FOR WHEEZING OR SHORTNESS OF BREATH 18 g 0   aspirin EC 81 MG tablet Take 81 mg by mouth daily. Swallow whole.     atorvastatin (LIPITOR) 20 MG tablet Take 1 tablet (20 mg total) by mouth every evening. 90 tablet 1   cephALEXin (KEFLEX) 500 MG capsule Take 1 capsule (500 mg total) by mouth 3 (three) times daily. 42 capsule 0   clopidogrel (PLAVIX) 75  MG tablet Take 1 tablet (75 mg total) by mouth every morning. 90 tablet 1   doxycycline (VIBRAMYCIN) 100 MG capsule Take 1 capsule (100 mg total) by mouth 2 (two) times daily. 14 capsule 0   FEROSUL 325 (65 Fe) MG tablet TAKE 1 TABLET BY MOUTH EVERY MORNING *REFILL REQUEST* 30 tablet 10   gabapentin (NEURONTIN) 300 MG capsule TAKE 2 CAPSULES BY MOUTH 3 TIMES DAILY *REFILL REQUEST* 180 capsule 10   glimepiride (AMARYL) 4 MG tablet TAKE 1 TABLET BY MOUTH EVERY MORNING *REFILL REQUEST* 30 tablet 10   metFORMIN (GLUCOPHAGE) 1000 MG tablet TAKE ONE TABLET BY MOUTH TWICE DAILY WITH A MEAL 180 tablet 1   Omega-3 Fatty Acids (FISH OIL) 1000 MG CAPS Take 2 capsules (2,000 mg total) by mouth 2 (two) times daily. 180 capsule 12   oxyCODONE-acetaminophen (PERCOCET) 7.5-325 MG tablet Take 1 tablet by mouth 3 (three) times daily for back pain. 90 tablet 0   oxyCODONE-acetaminophen (PERCOCET) 7.5-325 MG tablet Take 1 tablet by mouth 3 (three) times daily, for back pain. 90 tablet 0   oxyCODONE-acetaminophen (PERCOCET) 7.5-325 MG tablet Take 1 tablet by mouth 3 (three) times daily for back pain. (02/10/23) 90 tablet 0   oxyCODONE-acetaminophen (PERCOCET) 7.5-325 MG tablet Take 1 tablet by mouth 3 (three) times daily for back pain. 90 tablet 0   oxyCODONE-acetaminophen (PERCOCET) 7.5-325 MG tablet Take 1 tablet by  mouth 3 (three) times daily for back pain. 90 tablet 0   oxyCODONE-acetaminophen (PERCOCET) 7.5-325 MG tablet Take 1 tablet by mouth 3 (three) times daily for back pain 90 tablet 0   pantoprazole (PROTONIX) 40 MG tablet Take 40 mg by mouth every morning.     valsartan-hydrochlorothiazide (DIOVAN-HCT) 160-12.5 MG tablet TAKE 1 TABLET BY MOUTH EVERY MORNING *REFILL REQUEST* 30 tablet 10   No current facility-administered medications for this visit.    Family History  Problem Relation Age of Onset   Heart disease Brother    Leukemia Brother     Social History   Socioeconomic History   Marital status: Legally Separated    Spouse name: Not on file   Number of children: 2   Years of education: 11   Highest education level: Not on file  Occupational History   Occupation: unemployed    Comment: works for himself   Occupation: RETIRED FROM [employer]  Tobacco Use   Smoking status: Every Day    Current packs/day: 0.50    Average packs/day: 0.5 packs/day for 25.0 years (12.5 ttl pk-yrs)    Types: Cigarettes    Passive exposure: Never   Smokeless tobacco: Never  Vaping Use   Vaping status: Never Used  Substance and Sexual Activity   Alcohol use: Not Currently    Alcohol/week: 0.0 standard drinks of alcohol   Drug use: No   Sexual activity: Not Currently  Other Topics Concern   Not on file  Social History Narrative   PT HAS VERY VAGUE FAMILY HISTORY - HE WAS RAISED IN [facility]   Social Drivers of Health   Financial Resource Strain: Low Risk  (12/21/2021)   Overall Financial Resource Strain (CARDIA)    Difficulty of Paying Living Expenses: Not hard at all  Food Insecurity: No Food Insecurity (06/30/2022)   Hunger Vital Sign    Worried About Running Out of Food in the Last Year: Never true    Ran Out of Food in the Last Year: Never true  Transportation Needs: No Transportation Needs (06/30/2022)  PRAPARE - Administrator, Civil Service (Medical): No    Lack  of Transportation (Non-Medical): No  Physical Activity: Inactive (06/16/2020)   Exercise Vital Sign    Days of Exercise per Week: 0 days    Minutes of Exercise per Session: 30 min  Stress: Stress Concern Present (06/16/2020)   [employer] of Occupational Health - Occupational Stress Questionnaire    Feeling of Stress : To some extent  Social Connections: Socially Isolated (06/16/2020)   Social Connection and Isolation Panel [NHANES]    Frequency of Communication with Friends and Family: Three times a week    Frequency of Social Gatherings with Friends and Family: Never    Attends Religious Services: Never    Database administrator or Organizations: No    Attends Banker Meetings: Never    Marital Status: Divorced  Catering manager Violence: Not At Risk (06/30/2022)   Humiliation, Afraid, Rape, and Kick questionnaire    Fear of Current or Ex-Partner: No    Emotionally Abused: No    Physically Abused: No    Sexually Abused: No     REVIEW OF SYSTEMS:   [X]  denotes positive finding, [ ]  denotes negative finding Cardiac  Comments:  Chest pain or chest pressure:    Shortness of breath upon exertion:    Short of breath when lying flat:    Irregular heart rhythm:        Vascular    Pain in calf, thigh, or hip brought on by ambulation: x   Pain in feet at night that wakes you up from your sleep:  x   Blood clot in your veins:    Leg swelling:         Pulmonary    Oxygen at home:    Productive cough:     Wheezing:         Neurologic    Sudden weakness in arms or legs:     Sudden numbness in arms or legs:     Sudden onset of difficulty speaking or slurred speech:    Temporary loss of vision in one eye:     Problems with dizziness:         Gastrointestinal    Blood in stool:     Vomited blood:         Genitourinary    Burning when urinating:     Blood in urine:        Psychiatric    Major depression:         Hematologic    Bleeding problems:     Problems with blood clotting too easily:        Skin    Rashes or ulcers:        Constitutional    Fever or chills:      PHYSICAL EXAMINATION:  Today's Vitals   06/04/23 0919  BP: (!) 179/85  Pulse: 100  Resp: 20  Temp: 97.6 F (36.4 C)  TempSrc: Temporal  SpO2: 98%  Weight: 154 lb 12.8 oz (70.2 kg)  Height: 5\' 10"  (1.778 m)  PainSc: 4    Body mass index is 22.21 kg/m.   General:  WDWN in NAD; vital signs documented above Gait: Not observed HENT: WNL, normocephalic Pulmonary: normal non-labored breathing , without wheezing Cardiac: regular HR, without carotid bruits Abdomen: soft, NT; aortic pulse is not palpable Skin: without rashes Vascular Exam/Pulses:  Right Left  Radial 2+ (normal) 2+ (normal)  Femoral 2+ (normal)  2+ (normal)  DP monophasic Faint AT doppler  PT Brisk monophasic  absent  Peroneal monophasic absent   Extremities: pt with dependent rubor left foot.  No ulcerations are present Musculoskeletal: no muscle wasting or atrophy  Neurologic: A&O X 3 Psychiatric:  The pt has Normal affect.   Non-Invasive Vascular Imaging:   ABI's/TBI's on 06/04/2023: Right:  0.78/0.54 - Great toe pressure: 91 Left:  0.3/0 - Great toe pressure: 0  Arterial duplex on 06/04/2023: +-----------+--------+-----+--------+-------------------+--------+  LEFT      PSV cm/sRatioStenosisWaveform           Comments  +-----------+--------+-----+--------+-------------------+--------+  CFA Distal 140                  monophasic                   +-----------+--------+-----+--------+-------------------+--------+  DFA       171                  monophasic                   +-----------+--------+-----+--------+-------------------+--------+  SFA Prox   90                   monophasic                   +-----------+--------+-----+--------+-------------------+--------+  SFA Mid    64                   monophasic                    +-----------+--------+-----+--------+-------------------+--------+  ATA Distal 20                   dampened monophasic          +-----------+--------+-----+--------+-------------------+--------+  PTA Distal 12                   dampened monophasic          +-----------+--------+-----+--------+-------------------+--------+  PERO Distal11                   dampened monphasic           +-----------+--------+-----+--------+-------------------+--------+     Left Stent(s):  +-----------------+--------+---------------+----------+--------+  Distal SFA/AK popPSV cm/sStenosis       Waveform  Comments  +-----------------+--------+---------------+----------+--------+  Prox to Stent    64                     monophasic          +-----------------+--------+---------------+----------+--------+  Proximal Stent   54                     monophasic          +-----------------+--------+---------------+----------+--------+  Mid Stent        555     50-99% stenosismonophasic          +-----------------+--------+---------------+----------+--------+  Distal Stent     56                     monophasic          +-----------------+--------+---------------+----------+--------+  Distal to Stent  40                     monophasic          +-----------------+--------+---------------+----------+--------+    Previous ABI's/TBI's on 12/18/2022: Right:  0.87/0.42 - Great toe pressure:  69 Left:  0.70/0.41 - Great toe pressure:  68  Previous arterial duplex on 12/18/2022: Left: 50-74% stenosis noted in the superficial femoral artery. Patent  stent with no evidence of stenosis in the Left SFA-popliteal artery.     ASSESSMENT/PLAN:: 68 y.o. male here for follow up for PAD with hx of left SFA stenting for toe ulcer in January 2023 and left SFA drug coated balloon angioplasty for rest pain by Dr. Myra Gianotti.  On 07/28/2022, he underwent angiogram with left SFA  angioplasty and stenting for LLE ulceration by Dr. Lenell Antu.  PAD -pt with significant decrease in ABI and absent toe pressure today.  He has starting having significant cramping with walking and at night.  He is having numbness in his feet at night that improves with dependency.  On duplex today, he has a significant stenosis in the mid SFA stent on the left of 555 cm/s.   -continue asa/statin/plavix -plan for angiogram with possible intervention for in stent stenosis of left SFA for critical limb ischemia   Current smoker -discussed the importance of smoking cessation and that this puts him at risk for heart attack and stroke as well as limb loss especially in light of being a diabetic.  He is working on this.    Doreatha Massed, [facility] [phone]  Clinic MD:   Myra Gianotti

## 2023-06-04 NOTE — Progress Notes (Signed)
HISTORY AND PHYSICAL     CC:  follow up. Requesting Provider:  Blane Ohara, MD  HPI: This is a 68 y.o. male who is here today for follow up for PAD.  Pt has hx of left SFA stenting for toe ulcer in January 2023 and left SFA drug coated balloon angioplasty for rest pain by Dr. Myra Gianotti.  On 07/28/2022, he underwent angiogram with left SFA angioplasty and stenting for LLE ulceration by Dr. Lenell Antu.  Pt was last seen 05/07/2023 and at that time, he was having some erythema and cold feeling in the left foot.  He woke up with right leg  pain. He was found to have some cellulitis and was started on Keflex and brought back for studies.   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   The pt returns today for follow up.  He states that he took two weeks of abx and he feels the redness got better and has started getting red again.  He does not have any sores on his feet, but at night he does get numbness in his foot and it feels better to hang his foot off the bed and put on the cold floor.  He states the redness gets better when his leg is elevated and gets worse when his foot is hanging down.  He states that he does get some burning in the 4th and 5th toes. He states in the past 2-3 days has started getting severe cramping in the left calf with walking.  He does not think he could walk a city block due to this. He states this stent has been working for 10 months.   He does continue to smoke but says he is trying to quit.    is compliant with his asa/statin/plavix.   He has hx of PPM.   The pt is on a statin for cholesterol management.    The pt is on an aspirin.    Other AC:  Plavix The pt is on ARB, diuretic for hypertension.  The pt is is on medication for diabetes. Tobacco hx:  current  Pt does not have family hx of AAA.  Past Medical History:  Diagnosis Date   Allergic rhinitis 09/04/2012   Anemia    Arthritis    AV block    history of   Cardiac conduction disorder 11/26/2008     Hx of AV BLOCK (ICD-426.9) & CARDIAC PACEMAKER IN SITU (ICD-V45.01) - he was adm 1/10 w/ symptomatic bradycardias & 2:1 heart block requiring pacemaker- followed by DrTaylor... ~  Jan11:  Last seen by Cards & pacer reprogrammed...     Cardiac pacemaker in situ    Carotid artery disease (HCC) 07/27/2010   R/O PERIPHERAL VASCULAR DISEASE (ICD-443.9) - on ASA 81mg /d... exam 11/09 shows gr 1-2 sys murmur LSB, but also has prominent bruit to base of right side of his neck w/ right > left Carotid Bruit... ~  CDopplers 11/09 showed severe plaque right ICA, & min plaque on left... no signif ICA stenoses per report. 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 smokes 1/2 - 1ppd x yrs... ~  PFT's 11/08 showed FVC= 3.85 (79%), FEV1= 2.19 (55%), %1sec= 57, mid-flows= 32% predicted:  all c/w mod airflow obstruction...  ~  CXR 12/10 showed left subclav pacer, mild hyperinflation, NAD...     Depression    Dermatitis, contact    due to poison ivy   Diabetes mellitus (HCC) 02/04/2020   Diabetes mellitus with neuropathy (HCC) 12/20/2016   Dyslipidemia 03/06/2012   Encounter for prostate cancer screening 06/21/2022   Essential hypertension 04/10/2007    HYPERTENSION (ICD-401.9) - prev on Micardis 80mg /d then switched to Losartan 100mg /d, but he lost his job in 2011& couldn't afford Rx;  He called [place] 2/12 w/ this news & we phoned in Lisinopril 20mg /d... ~  2DEcho 11/09 showed sl incr LVwall thickness, norm LVF w/ EF= 55-60%, no regional wall motion abn, mild DD, mild MR... ~  Myoview 12/09 showed LBBB, no perfusion defects, abn septal motion & EF   GERD 03/26/2008    GERD (ICD-530.81) - prev mild GERD symptoms that improved after cholecystectomy on  2000... uses OTC PPI as needed... ~  he is due for routine screening colonoscopy...       Iron deficiency anemia due to chronic blood loss 06/21/2022   Mixed hyperlipidemia 06/19/2019   PVD (peripheral vascular disease) (HCC)    Sciatica 11/18/2019   Tobacco use 02/04/2020   Tobacco use disorder     Past Surgical History:  Procedure Laterality Date   ABDOMINAL AORTOGRAM W/LOWER EXTREMITY N/A 12/14/2020   Procedure: ABDOMINAL AORTOGRAM W/LOWER EXTREMITY;  Surgeon: Nada Libman, MD;  Location: [facility];  Service: Cardiovascular;  Laterality: N/A;   ABDOMINAL AORTOGRAM W/LOWER EXTREMITY N/A 05/24/2021   Procedure: ABDOMINAL AORTOGRAM W/LOWER EXTREMITY;  Surgeon: Nada Libman, MD;  Location: [facility];  Service: Cardiovascular;  Laterality: N/A;   ABDOMINAL AORTOGRAM W/LOWER EXTREMITY N/A 07/28/2022   Procedure: ABDOMINAL AORTOGRAM W/LOWER EXTREMITY;  Surgeon: [name], MD;  Location: [facility];  Service: Cardiovascular;  Laterality: N/A;   CATARACT EXTRACTION Bilateral    laproscopic cholecystectomy  05/08/1998   by Dr. Odie Sera   MOUTH SURGERY     PACEMAKER INSERTION  03/08/2008   by Dr. Ladona Ridgel   PERIPHERAL VASCULAR INTERVENTION Left 12/14/2020   Procedure: PERIPHERAL VASCULAR INTERVENTION;  Surgeon: Nada Libman, MD;  Location: [facility];  Service: Cardiovascular;  Laterality: Left;  SFA   PERIPHERAL VASCULAR INTERVENTION Left 05/24/2021   Procedure: PERIPHERAL VASCULAR INTERVENTION;  Surgeon: Nada Libman, MD;  Location: [facility];  Service: Cardiovascular;  Laterality: Left;   PERIPHERAL VASCULAR INTERVENTION Left 07/28/2022   Procedure: PERIPHERAL VASCULAR INTERVENTION;  Surgeon: [name], MD;  Location: [facility];  Service: Cardiovascular;  Laterality: Left;  Lt SFA   PPM GENERATOR CHANGEOUT N/A 05/25/2021   Procedure: PPM GENERATOR CHANGEOUT;  Surgeon: Regan Lemming, MD;  Location: [facility];  Service: Cardiovascular;  Laterality: N/A;    No Known Allergies  Current Outpatient Medications  Medication Sig Dispense Refill   albuterol (VENTOLIN HFA) 108 (90 Base) MCG/ACT inhaler INHALE 2 PUFFS BY MOUTH EVERY 6 HOURS AS NEEDED FOR WHEEZING OR SHORTNESS OF BREATH 18 g 0   aspirin EC 81 MG tablet Take 81 mg by mouth daily. Swallow whole.     atorvastatin (LIPITOR) 20 MG tablet Take 1 tablet (20 mg total) by mouth every evening. 90 tablet 1   cephALEXin (KEFLEX) 500 MG capsule Take 1 capsule (500 mg total) by mouth 3 (three) times daily. 42 capsule 0   clopidogrel (PLAVIX) 75  MG tablet Take 1 tablet (75 mg total) by mouth every morning. 90 tablet 1   doxycycline (VIBRAMYCIN) 100 MG capsule Take 1 capsule (100 mg total) by mouth 2 (two) times daily. 14 capsule 0   FEROSUL 325 (65 Fe) MG tablet TAKE 1 TABLET BY MOUTH EVERY MORNING *REFILL REQUEST* 30 tablet 10   gabapentin (NEURONTIN) 300 MG capsule TAKE 2 CAPSULES BY MOUTH 3 TIMES DAILY *REFILL REQUEST* 180 capsule 10   glimepiride (AMARYL) 4 MG tablet TAKE 1 TABLET BY MOUTH EVERY MORNING *REFILL REQUEST* 30 tablet 10   metFORMIN (GLUCOPHAGE) 1000 MG tablet TAKE ONE TABLET BY MOUTH TWICE DAILY WITH A MEAL 180 tablet 1   Omega-3 Fatty Acids (FISH OIL) 1000 MG CAPS Take 2 capsules (2,000 mg total) by mouth 2 (two) times daily. 180 capsule 12   oxyCODONE-acetaminophen (PERCOCET) 7.5-325 MG tablet Take 1 tablet by mouth 3 (three) times daily for back pain. 90 tablet 0   oxyCODONE-acetaminophen (PERCOCET) 7.5-325 MG tablet Take 1 tablet by mouth 3 (three) times daily, for back pain. 90 tablet 0   oxyCODONE-acetaminophen (PERCOCET) 7.5-325 MG tablet Take 1 tablet by mouth 3 (three) times daily for back pain. (02/10/23) 90 tablet 0   oxyCODONE-acetaminophen (PERCOCET) 7.5-325 MG tablet Take 1 tablet by mouth 3 (three) times daily for back pain. 90 tablet 0   oxyCODONE-acetaminophen (PERCOCET) 7.5-325 MG tablet Take 1 tablet by  mouth 3 (three) times daily for back pain. 90 tablet 0   oxyCODONE-acetaminophen (PERCOCET) 7.5-325 MG tablet Take 1 tablet by mouth 3 (three) times daily for back pain 90 tablet 0   pantoprazole (PROTONIX) 40 MG tablet Take 40 mg by mouth every morning.     valsartan-hydrochlorothiazide (DIOVAN-HCT) 160-12.5 MG tablet TAKE 1 TABLET BY MOUTH EVERY MORNING *REFILL REQUEST* 30 tablet 10   No current facility-administered medications for this visit.    Family History  Problem Relation Age of Onset   Heart disease Brother    Leukemia Brother     Social History   Socioeconomic History   Marital status: Legally Separated    Spouse name: Not on file   Number of children: 2   Years of education: 11   Highest education level: Not on file  Occupational History   Occupation: unemployed    Comment: works for himself   Occupation: RETIRED FROM [employer]  Tobacco Use   Smoking status: Every Day    Current packs/day: 0.50    Average packs/day: 0.5 packs/day for 25.0 years (12.5 ttl pk-yrs)    Types: Cigarettes    Passive exposure: Never   Smokeless tobacco: Never  Vaping Use   Vaping status: Never Used  Substance and Sexual Activity   Alcohol use: Not Currently    Alcohol/week: 0.0 standard drinks of alcohol   Drug use: No   Sexual activity: Not Currently  Other Topics Concern   Not on file  Social History Narrative   PT HAS VERY VAGUE FAMILY HISTORY - HE WAS RAISED IN [facility]   Social Drivers of Health   Financial Resource Strain: Low Risk  (12/21/2021)   Overall Financial Resource Strain (CARDIA)    Difficulty of Paying Living Expenses: Not hard at all  Food Insecurity: No Food Insecurity (06/30/2022)   Hunger Vital Sign    Worried About Running Out of Food in the Last Year: Never true    Ran Out of Food in the Last Year: Never true  Transportation Needs: No Transportation Needs (06/30/2022)  PRAPARE - Administrator, Civil Service (Medical): No    Lack  of Transportation (Non-Medical): No  Physical Activity: Inactive (06/16/2020)   Exercise Vital Sign    Days of Exercise per Week: 0 days    Minutes of Exercise per Session: 30 min  Stress: Stress Concern Present (06/16/2020)   [employer] of Occupational Health - Occupational Stress Questionnaire    Feeling of Stress : To some extent  Social Connections: Socially Isolated (06/16/2020)   Social Connection and Isolation Panel [NHANES]    Frequency of Communication with Friends and Family: Three times a week    Frequency of Social Gatherings with Friends and Family: Never    Attends Religious Services: Never    Database administrator or Organizations: No    Attends Banker Meetings: Never    Marital Status: Divorced  Catering manager Violence: Not At Risk (06/30/2022)   Humiliation, Afraid, Rape, and Kick questionnaire    Fear of Current or Ex-Partner: No    Emotionally Abused: No    Physically Abused: No    Sexually Abused: No     REVIEW OF SYSTEMS:   [X]  denotes positive finding, [ ]  denotes negative finding Cardiac  Comments:  Chest pain or chest pressure:    Shortness of breath upon exertion:    Short of breath when lying flat:    Irregular heart rhythm:        Vascular    Pain in calf, thigh, or hip brought on by ambulation: x   Pain in feet at night that wakes you up from your sleep:  x   Blood clot in your veins:    Leg swelling:         Pulmonary    Oxygen at home:    Productive cough:     Wheezing:         Neurologic    Sudden weakness in arms or legs:     Sudden numbness in arms or legs:     Sudden onset of difficulty speaking or slurred speech:    Temporary loss of vision in one eye:     Problems with dizziness:         Gastrointestinal    Blood in stool:     Vomited blood:         Genitourinary    Burning when urinating:     Blood in urine:        Psychiatric    Major depression:         Hematologic    Bleeding problems:     Problems with blood clotting too easily:        Skin    Rashes or ulcers:        Constitutional    Fever or chills:      PHYSICAL EXAMINATION:  Today's Vitals   06/04/23 0919  BP: (!) 179/85  Pulse: 100  Resp: 20  Temp: 97.6 F (36.4 C)  TempSrc: Temporal  SpO2: 98%  Weight: 154 lb 12.8 oz (70.2 kg)  Height: 5\' 10"  (1.778 m)  PainSc: 4    Body mass index is 22.21 kg/m.   General:  WDWN in NAD; vital signs documented above Gait: Not observed HENT: WNL, normocephalic Pulmonary: normal non-labored breathing , without wheezing Cardiac: regular HR, without carotid bruits Abdomen: soft, NT; aortic pulse is not palpable Skin: without rashes Vascular Exam/Pulses:  Right Left  Radial 2+ (normal) 2+ (normal)  Femoral 2+ (normal)  2+ (normal)  DP monophasic Faint AT doppler  PT Brisk monophasic  absent  Peroneal monophasic absent   Extremities: pt with dependent rubor left foot.  No ulcerations are present Musculoskeletal: no muscle wasting or atrophy  Neurologic: A&O X 3 Psychiatric:  The pt has Normal affect.   Non-Invasive Vascular Imaging:   ABI's/TBI's on 06/04/2023: Right:  0.78/0.54 - Great toe pressure: 91 Left:  0.3/0 - Great toe pressure: 0  Arterial duplex on 06/04/2023: +-----------+--------+-----+--------+-------------------+--------+  LEFT      PSV cm/sRatioStenosisWaveform           Comments  +-----------+--------+-----+--------+-------------------+--------+  CFA Distal 140                  monophasic                   +-----------+--------+-----+--------+-------------------+--------+  DFA       171                  monophasic                   +-----------+--------+-----+--------+-------------------+--------+  SFA Prox   90                   monophasic                   +-----------+--------+-----+--------+-------------------+--------+  SFA Mid    64                   monophasic                    +-----------+--------+-----+--------+-------------------+--------+  ATA Distal 20                   dampened monophasic          +-----------+--------+-----+--------+-------------------+--------+  PTA Distal 12                   dampened monophasic          +-----------+--------+-----+--------+-------------------+--------+  PERO Distal11                   dampened monphasic           +-----------+--------+-----+--------+-------------------+--------+     Left Stent(s):  +-----------------+--------+---------------+----------+--------+  Distal SFA/AK popPSV cm/sStenosis       Waveform  Comments  +-----------------+--------+---------------+----------+--------+  Prox to Stent    64                     monophasic          +-----------------+--------+---------------+----------+--------+  Proximal Stent   54                     monophasic          +-----------------+--------+---------------+----------+--------+  Mid Stent        555     50-99% stenosismonophasic          +-----------------+--------+---------------+----------+--------+  Distal Stent     56                     monophasic          +-----------------+--------+---------------+----------+--------+  Distal to Stent  40                     monophasic          +-----------------+--------+---------------+----------+--------+    Previous ABI's/TBI's on 12/18/2022: Right:  0.87/0.42 - Great toe pressure:  69 Left:  0.70/0.41 - Great toe pressure:  68  Previous arterial duplex on 12/18/2022: Left: 50-74% stenosis noted in the superficial femoral artery. Patent  stent with no evidence of stenosis in the Left SFA-popliteal artery.     ASSESSMENT/PLAN:: 68 y.o. male here for follow up for PAD with hx of left SFA stenting for toe ulcer in January 2023 and left SFA drug coated balloon angioplasty for rest pain by Dr. Myra Gianotti.  On 07/28/2022, he underwent angiogram with left SFA  angioplasty and stenting for LLE ulceration by Dr. Lenell Antu.  PAD -pt with significant decrease in ABI and absent toe pressure today.  He has starting having significant cramping with walking and at night.  He is having numbness in his feet at night that improves with dependency.  On duplex today, he has a significant stenosis in the mid SFA stent on the left of 555 cm/s.   -continue asa/statin/plavix -plan for angiogram with possible intervention for in stent stenosis of left SFA for critical limb ischemia   Current smoker -discussed the importance of smoking cessation and that this puts him at risk for heart attack and stroke as well as limb loss especially in light of being a diabetic.  He is working on this.    Doreatha Massed, [facility] [phone]  Clinic MD:   Myra Gianotti

## 2023-06-05 ENCOUNTER — Other Ambulatory Visit: Payer: Self-pay

## 2023-06-05 DIAGNOSIS — I739 Peripheral vascular disease, unspecified: Secondary | ICD-10-CM

## 2023-06-05 DIAGNOSIS — I70222 Atherosclerosis of native arteries of extremities with rest pain, left leg: Secondary | ICD-10-CM

## 2023-06-08 ENCOUNTER — Encounter (HOSPITAL_COMMUNITY)
Admission: RE | Disposition: A | Discharge status: Home / Self Care | Payer: Self-pay | Source: Home / Self Care | Attending: Vascular Surgery

## 2023-06-08 ENCOUNTER — Ambulatory Visit (HOSPITAL_COMMUNITY)
Admission: RE | Admit: 2023-06-08 | Discharge: 2023-06-08 | Disposition: A | Discharge status: Home / Self Care | Payer: PPO | Attending: Vascular Surgery | Admitting: Vascular Surgery

## 2023-06-08 ENCOUNTER — Other Ambulatory Visit: Payer: Self-pay

## 2023-06-08 DIAGNOSIS — Z7984 Long term (current) use of oral hypoglycemic drugs: Secondary | ICD-10-CM | POA: Diagnosis not present

## 2023-06-08 DIAGNOSIS — E1151 Type 2 diabetes mellitus with diabetic peripheral angiopathy without gangrene: Secondary | ICD-10-CM | POA: Insufficient documentation

## 2023-06-08 DIAGNOSIS — Z79899 Other long term (current) drug therapy: Secondary | ICD-10-CM | POA: Diagnosis not present

## 2023-06-08 DIAGNOSIS — Z950 Presence of cardiac pacemaker: Secondary | ICD-10-CM | POA: Insufficient documentation

## 2023-06-08 DIAGNOSIS — Z95828 Presence of other vascular implants and grafts: Secondary | ICD-10-CM | POA: Insufficient documentation

## 2023-06-08 DIAGNOSIS — Z7902 Long term (current) use of antithrombotics/antiplatelets: Secondary | ICD-10-CM | POA: Insufficient documentation

## 2023-06-08 DIAGNOSIS — F17210 Nicotine dependence, cigarettes, uncomplicated: Secondary | ICD-10-CM | POA: Diagnosis not present

## 2023-06-08 DIAGNOSIS — I739 Peripheral vascular disease, unspecified: Secondary | ICD-10-CM

## 2023-06-08 DIAGNOSIS — I10 Essential (primary) hypertension: Secondary | ICD-10-CM | POA: Diagnosis not present

## 2023-06-08 DIAGNOSIS — I70222 Atherosclerosis of native arteries of extremities with rest pain, left leg: Secondary | ICD-10-CM

## 2023-06-08 DIAGNOSIS — Z8249 Family history of ischemic heart disease and other diseases of the circulatory system: Secondary | ICD-10-CM | POA: Diagnosis not present

## 2023-06-08 DIAGNOSIS — Z7982 Long term (current) use of aspirin: Secondary | ICD-10-CM | POA: Diagnosis not present

## 2023-06-08 HISTORY — PX: PERIPHERAL VASCULAR BALLOON ANGIOPLASTY: CATH118281

## 2023-06-08 HISTORY — PX: ABDOMINAL AORTOGRAM W/LOWER EXTREMITY: CATH118223

## 2023-06-08 LAB — POCT I-STAT, CHEM 8
BUN: 15 mg/dL (ref 8–23)
Calcium, Ion: 1.24 mmol/L (ref 1.15–1.40)
Chloride: 102 mmol/L (ref 98–111)
Creatinine, Ser: 1 mg/dL (ref 0.61–1.24)
Glucose, Bld: 114 mg/dL — ABNORMAL HIGH (ref 70–99)
HCT: 44 % (ref 39.0–52.0)
Hemoglobin: 15 g/dL (ref 13.0–17.0)
Potassium: 4.2 mmol/L (ref 3.5–5.1)
Sodium: 140 mmol/L (ref 135–145)
TCO2: 27 mmol/L (ref 22–32)

## 2023-06-08 SURGERY — ABDOMINAL AORTOGRAM W/LOWER EXTREMITY
Anesthesia: LOCAL

## 2023-06-08 MED ORDER — PROTAMINE SULFATE 10 MG/ML IV SOLN
INTRAVENOUS | Status: AC
  Filled 2023-06-08: qty 5

## 2023-06-08 MED ORDER — HYDRALAZINE HCL 20 MG/ML IJ SOLN: 5.0000 mg | INTRAMUSCULAR | Status: DC | PRN

## 2023-06-08 MED ORDER — ONDANSETRON HCL 4 MG/2ML IJ SOLN: 4.0000 mg | Freq: Four times a day (QID) | INTRAMUSCULAR | Status: DC | PRN

## 2023-06-08 MED ORDER — SODIUM CHLORIDE 0.9% FLUSH: 3.0000 mL | Freq: Two times a day (BID) | INTRAVENOUS | Status: DC

## 2023-06-08 MED ORDER — LABETALOL HCL 5 MG/ML IV SOLN
INTRAVENOUS | Status: AC
  Filled 2023-06-08: qty 4

## 2023-06-08 MED ORDER — FENTANYL CITRATE (PF) 100 MCG/2ML IJ SOLN
INTRAMUSCULAR | Status: AC
  Filled 2023-06-08: qty 2

## 2023-06-08 MED ORDER — ACETAMINOPHEN 325 MG PO TABS
650.0000 mg | ORAL_TABLET | ORAL | Status: DC | PRN
Start: 2023-06-08 — End: 2023-06-08

## 2023-06-08 MED ORDER — ASPIRIN 81 MG PO TBEC: 81.0000 mg | DELAYED_RELEASE_TABLET | Freq: Every day | ORAL | Status: DC

## 2023-06-08 MED ORDER — MIDAZOLAM HCL 2 MG/2ML IJ SOLN
INTRAMUSCULAR | Status: DC | PRN
  Administered 2023-06-08: 1 mg via INTRAVENOUS

## 2023-06-08 MED ORDER — HEPARIN (PORCINE) IN NACL 1000-0.9 UT/500ML-% IV SOLN
INTRAVENOUS | Status: DC | PRN
  Administered 2023-06-08 (×2): 500 mL

## 2023-06-08 MED ORDER — SODIUM CHLORIDE 0.9% FLUSH: 3.0000 mL | INTRAVENOUS | Status: DC | PRN

## 2023-06-08 MED ORDER — LIDOCAINE HCL (PF) 1 % IJ SOLN
INTRAMUSCULAR | Status: AC
  Filled 2023-06-08: qty 30

## 2023-06-08 MED ORDER — ASPIRIN 81 MG PO CHEW
CHEWABLE_TABLET | ORAL | Status: DC | PRN
  Administered 2023-06-08: 81 mg via ORAL

## 2023-06-08 MED ORDER — LABETALOL HCL 5 MG/ML IV SOLN
INTRAVENOUS | Status: DC | PRN
  Administered 2023-06-08: 20 mg via INTRAVENOUS

## 2023-06-08 MED ORDER — LIDOCAINE HCL (PF) 1 % IJ SOLN
INTRAMUSCULAR | Status: DC | PRN
  Administered 2023-06-08: 15 mL

## 2023-06-08 MED ORDER — HEPARIN SODIUM (PORCINE) 1000 UNIT/ML IJ SOLN
INTRAMUSCULAR | Status: AC
  Filled 2023-06-08: qty 10

## 2023-06-08 MED ORDER — LABETALOL HCL 5 MG/ML IV SOLN
10.0000 mg | INTRAVENOUS | Status: DC | PRN
  Administered 2023-06-08: 10 mg via INTRAVENOUS

## 2023-06-08 MED ORDER — SODIUM CHLORIDE 0.9 % IV SOLN: INTRAVENOUS | Status: DC

## 2023-06-08 MED ORDER — HEPARIN SODIUM (PORCINE) 1000 UNIT/ML IJ SOLN
INTRAMUSCULAR | Status: DC | PRN
  Administered 2023-06-08: 7000 [IU] via INTRAVENOUS

## 2023-06-08 MED ORDER — ASPIRIN 81 MG PO CHEW
CHEWABLE_TABLET | ORAL | Status: AC
  Filled 2023-06-08: qty 1

## 2023-06-08 MED ORDER — CLOPIDOGREL BISULFATE 75 MG PO TABS
ORAL_TABLET | ORAL | Status: DC | PRN
  Administered 2023-06-08: 75 mg via ORAL

## 2023-06-08 MED ORDER — PROTAMINE SULFATE 10 MG/ML IV SOLN
INTRAVENOUS | Status: DC | PRN
  Administered 2023-06-08: 5 mg via INTRAVENOUS
  Administered 2023-06-08: 25 mg via INTRAVENOUS

## 2023-06-08 MED ORDER — SODIUM CHLORIDE 0.9 % WEIGHT BASED INFUSION
1.0000 mL/kg/h | INTRAVENOUS | Status: DC
Start: 2023-06-08 — End: 2023-06-08

## 2023-06-08 MED ORDER — MIDAZOLAM HCL 2 MG/2ML IJ SOLN
INTRAMUSCULAR | Status: AC
  Filled 2023-06-08: qty 2

## 2023-06-08 MED ORDER — SODIUM CHLORIDE 0.9 % IV SOLN: 250.0000 mL | INTRAVENOUS | Status: DC | PRN

## 2023-06-08 MED ORDER — FENTANYL CITRATE (PF) 100 MCG/2ML IJ SOLN
INTRAMUSCULAR | Status: DC | PRN
  Administered 2023-06-08: 25 ug via INTRAVENOUS

## 2023-06-08 MED ORDER — CLOPIDOGREL BISULFATE 75 MG PO TABS: 75.0000 mg | ORAL_TABLET | Freq: Every day | ORAL | Status: DC

## 2023-06-08 MED ORDER — CLOPIDOGREL BISULFATE 75 MG PO TABS
ORAL_TABLET | ORAL | Status: AC
  Filled 2023-06-08: qty 1

## 2023-06-08 SURGICAL SUPPLY — 16 items
BALLN IN.PACT DCB 5X120 (BALLOONS) ×2
BALLN IN.PACT DCB 6X40 (BALLOONS) ×2
CATH OMNI FLUSH 5F 65CM (CATHETERS) IMPLANT
CLOSURE PERCLOSE PROSTYLE (VASCULAR PRODUCTS) IMPLANT
DCB IN.PACT 5X120 (BALLOONS) IMPLANT
DCB IN.PACT 6X40 (BALLOONS) IMPLANT
GLIDEWIRE ADV .035X260CM (WIRE) IMPLANT
KIT ENCORE 26 ADVANTAGE (KITS) IMPLANT
KIT SYRINGE INJ CVI SPIKEX1 (MISCELLANEOUS) IMPLANT
SET ATX-X65L (MISCELLANEOUS) IMPLANT
SHEATH CATAPULT 6FR 45 (SHEATH) IMPLANT
SHEATH PINNACLE 5F 10CM (SHEATH) IMPLANT
SHEATH PROBE COVER 6X72 (BAG) IMPLANT
TRAY PV CATH (CUSTOM PROCEDURE TRAY) ×3 IMPLANT
WIRE BENTSON .035X145CM (WIRE) IMPLANT
WIRE MICRO SET SILHO 5FR 7 (SHEATH) IMPLANT

## 2023-06-08 NOTE — Op Note (Signed)
DATE OF SERVICE: 06/08/2023  PATIENT:  Andrew Wiggins  68 y.o. male  PRE-OPERATIVE DIAGNOSIS:  Atherosclerosis of native arteries of left lower extremity causing rest pain  POST-OPERATIVE DIAGNOSIS:  Same  PROCEDURE:   1) Ultrasound guided right common femoral artery access 2) Aortogram 3) Left lower extremity angiogram with second order cannulation (48mL total contrast) 4) Left superficial femoral artery drug-coated angioplasty (5x184mm INPact) 5) Left external iliac artery drug-coated angioplasty (6x73mm INPact) 6) Conscious sedation (39 minutes)  SURGEON:  Rande Brunt. Lenell Antu, MD  ASSISTANT: none  ANESTHESIA:   local and IV sedation  ESTIMATED BLOOD LOSS: minimal  LOCAL MEDICATIONS USED:  LIDOCAINE   COUNTS: confirmed correct.  PATIENT DISPOSITION:  PACU - hemodynamically stable.   Delay start of Pharmacological VTE agent (>24hrs) due to surgical blood loss or risk of bleeding: no  INDICATION FOR PROCEDURE: Andrew Wiggins is a 67 y.o. male with atherosclerosis of native arteries of left lower extremity with rest pain.  He has undergone multiple endovascular interventions for the same.  Noninvasive evaluation showed in-stent restenosis of left superficial femoral artery stenting.  After careful discussion of risks, benefits, and alternatives the patient was offered angiogram. The patient understood and wished to proceed.  OPERATIVE FINDINGS:   Left renal artery: Patent Right renal artery: Patent  Infrarenal aorta: Patent  Left common iliac artery: Patent Right common iliac artery: Patent  Left internal iliac artery: Severely diseased, nearly occluded, atretic throughout its course Right internal iliac artery: patent  Left external iliac artery: 60 to 70% proximal artery stenosis Right external iliac artery: patent  Left common femoral artery: 50 to 60% stenosis in the midportion of the artery Right common femoral artery: not studied  Left profunda femoris artery: patent Right  profunda femoris artery: not studied  Left superficial femoral artery: near occlusion of prior stenting; very small arteries Right superficial femoral artery: not studied  Left popliteal artery: patent, but small Right popliteal artery: not studied  Left anterior tibial artery: dominant tibial, flows to foot Right anterior tibial artery: not studied  Left tibioperoneal trunk: patent Right tibioperoneal trunk: not studied  Left peroneal artery: patent Right peroneal artery: not studied  Left posterior tibial artery: patent Right posterior tibial artery: not studied  Left pedal circulation: disadvantaged, but fills from AT Right pedal circulation: not studied   GLASS score. FP 2. IP 0. Stage I.   WIfI score. N/A. No wound  DESCRIPTION OF PROCEDURE: After identification of the patient in the pre-operative holding area, the patient was transferred to the operating room. The patient was positioned supine on the operating room table.  Anesthesia was induced. The groins was prepped and draped in standard fashion. A surgical pause was performed confirming correct patient, procedure, and operative location.  The right groin was anesthetized with subcutaneous injection of 1% lidocaine. Using ultrasound guidance, the right common femoral artery was accessed with micropuncture technique. Fluoroscopy was used to confirm cannulation over the femoral head.   A Benson wire was advanced into the distal aorta. Over the wire an omni flush catheter was advanced to the level of L2. Aortogram was performed - see above for details.   The left common iliac artery was selected with an omniflush catheter and glidewire advantage guidewire. The wire was advanced into the common femoral artery. Over the wire the omni flush catheter was advanced into the external iliac artery. Selective angiography was performed - see above for details.   The decision was made to intervene. The  patient was heparinized with 7,000 units of  heparin. The 285F sheath was exchanged for a 85F x 45cm sheath. Selective angiography of the left lower extremity was performed prior to intervention.   The lesions were treated with: Left superficial femoral artery drug-coated angioplasty (5x138mm INPact) Left external iliac artery drug-coated angioplasty (6x30mm INPact)  Completion angiography revealed:  Resolution of external iliac and superficial femoral stenosis  A perclose device was used to close the arteriotomy. This failed, and manual pressure was held.  Conscious sedation was administered with the use of IV fentanyl and midazolam under continuous physician and nurse monitoring.  Heart rate, blood pressure, and oxygen saturation were continuously monitored.  Total sedation time was 39 minutes  Upon completion of the case instrument and sharps counts were confirmed correct. The patient was transferred to the PACU in good condition. I was present for all portions of the procedure.  PLAN: ASA / Plavix / Statin. Follow up with me in 4 weeks with ABI and LLE duplex.  Rande Brunt. Lenell Antu, MD [facility] Phone Number: [phone] 06/08/2023 8:40 AM

## 2023-06-08 NOTE — Interval H&P Note (Signed)
History and Physical Interval Note:  06/08/2023 7:48 AM  Andrew Wiggins  has presented today for surgery, with the diagnosis of in stent stenosis.  The various methods of treatment have been discussed with the patient and family. After consideration of risks, benefits and other options for treatment, the patient has consented to  Procedure(s): ABDOMINAL AORTOGRAM W/LOWER EXTREMITY (N/A) as a surgical intervention.  The patient's history has been reviewed, patient examined, no change in status, stable for surgery.  I have reviewed the patient's chart and labs.  Questions were answered to the patient's satisfaction.     [name]

## 2023-06-08 NOTE — Discharge Instructions (Signed)
 Femoral Site Care The following information offers guidance on how to care for yourself after your procedure. Your health care provider may also give you more specific instructions. If you have problems or questions, contact your health care provider. What can I expect after the procedure? After the procedure, it is common to have bruising and tenderness at the incision site. This usually fades within 1-2 weeks. Follow these instructions at home: Incision site care  Follow instructions from your health care provider about how to take care of your incision site. Make sure you: Wash your hands with soap and water for at least 20 seconds before and after you change your bandage (dressing). If soap and water are not available, use hand sanitizer. Remove your dressing in 24 hours. Leave stitches (sutures), skin glue, or adhesive strips in place. These skin closures may need to stay in place for 2 weeks or longer. If adhesive strip edges start to loosen and curl up, you may trim the loose edges. Do not remove adhesive strips completely unless your health care provider tells you to do that. Do not take baths, swim, or use a hot tub for at least 1 week. You may shower 24 hours after the procedure or as told by your health care provider. Gently wash the incision site with plain soap and water. Pat the area dry with a clean towel. Do not rub the site. This may cause bleeding. Do not apply powder or lotion to the site. Keep the site clean and dry. Check your femoral site every day for signs of infection. Check for: Redness, swelling, or pain. Fluid or blood. Warmth. Pus or a bad smell. Activity If you were given a sedative during the procedure, it can affect you for several hours. Do not drive or operate machinery until your health care provider says that it is safe. Rest as told by your health care provider. Avoid sitting for a long time without moving. Get up to take short walks every 1-2 hours. This  is important to improve blood flow and breathing. Ask for help if you feel weak or unsteady. Return to your normal activities as told by your health care provider. Ask your health care provider what activities are safe for you and when you can return to work. Avoid activities that take a lot of effort for the first 2-3 days after your procedure, or as long as directed. Do not lift anything that is heavier than 10 lb (4.5 kg), or the limit that you are told, until your health care provider says that it is safe. General instructions Take over-the-counter and prescription medicines only as told by your health care provider. If you will be going home right after the procedure, plan to have a responsible adult care for you for the time you are told. This is important. Keep all follow-up visits. This is important. Contact a health care provider if: You have a fever or chills. You have any of these signs of infection at your incision site: Redness, swelling, or pain. Fluid or blood. Warmth. Pus or a bad smell. Get help right away if: The incision area swells very fast. The incision area is bleeding, and the bleeding does not stop when you hold steady pressure on the area. Your leg or foot becomes pale, cool, tingly, or numb. These symptoms may represent a serious problem that is an emergency. Do not wait to see if the symptoms will go away. Get medical help right away. Call your local emergency  services (911 in the [place] Do not drive yourself to the hospital. Summary After the procedure, it is common to have bruising and tenderness that fade within 1-2 weeks. Check your femoral site every day for signs of infection. Do not lift anything that is heavier than 10 lb (4.5 kg), or the limit that you are told, until your health care provider says that it is safe. Get help right away if the incision area swells very fast, you have bleeding at the incision area that does not stop, or your leg or foot  becomes pale, cool, or numb. This information is not intended to replace advice given to you by your health care provider. Make sure you discuss any questions you have with your health care provider. Document Revised: 01/12/2021 Document Reviewed: 06/13/2020 Elsevier Patient Education  2024 [employer].

## 2023-06-11 ENCOUNTER — Encounter (HOSPITAL_COMMUNITY): Payer: Self-pay | Admitting: Vascular Surgery

## 2023-06-11 MED ORDER — IODIXANOL 320 MG/ML IV SOLN
INTRAVENOUS | Status: DC | PRN
  Administered 2023-06-08: 48 mL

## 2023-06-12 ENCOUNTER — Telehealth: Payer: Self-pay

## 2023-06-12 ENCOUNTER — Other Ambulatory Visit: Payer: Self-pay | Admitting: Family Medicine

## 2023-06-12 DIAGNOSIS — I739 Peripheral vascular disease, unspecified: Secondary | ICD-10-CM

## 2023-06-12 DIAGNOSIS — E782 Mixed hyperlipidemia: Secondary | ICD-10-CM

## 2023-06-12 MED ORDER — CLOPIDOGREL BISULFATE 75 MG PO TABS: 75.0000 mg | ORAL_TABLET | Freq: Every morning | ORAL | 1 refills | Status: DC

## 2023-06-12 MED ORDER — METFORMIN HCL 1000 MG PO TABS: 1000.0000 mg | ORAL_TABLET | Freq: Two times a day (BID) | ORAL | 1 refills | Status: DC

## 2023-06-12 MED ORDER — ATORVASTATIN CALCIUM 20 MG PO TABS: 20.0000 mg | ORAL_TABLET | Freq: Every evening | ORAL | 1 refills | Status: DC

## 2023-06-12 NOTE — Telephone Encounter (Signed)
 Copied from CRM [id number]. Topic: Clinical - Medication Refill >> Jun 12, 2023 10:00 AM Laurier BROCKS wrote: Most Recent Primary Care Visit:  Provider: CURT CONINE  Department: [facility]  Visit Type: OFFICE VISIT  Date: 06/21/2022  Medication: atorvastatin  (LIPITOR) 20 MG tablet clopidogrel  (PLAVIX ) 75 MG tablet metFORMIN  (GLUCOPHAGE ) 1000 MG tablet  Has the patient contacted their pharmacy? Yes (Agent: If no, request that the patient contact the pharmacy for the refill. If patient does not wish to contact the pharmacy document the reason why and proceed with request.) (Agent: If yes, when and what did the pharmacy advise?)  Is this the correct pharmacy for this prescription? Yes If no, delete pharmacy and type the correct one.  This is the patient's preferred pharmacy:  [facility] [place]  [place], [place] - [address] [place] [address] Phone: [phone] Fax: [phone]   Has the prescription been filled recently? No  Is the patient out of the medication? Yes  Has the patient been seen for an appointment in the last year OR does the patient have an upcoming appointment? Yes  Can we respond through MyChart? Yes  Agent: Please be advised that Rx refills may take up to 3 business days. We ask that you follow-up with your pharmacy.

## 2023-06-12 NOTE — Telephone Encounter (Signed)
 Attempted to contact patient, left patient a message informed him there were a couple of possibilities as to why these 3 medications were not [facility]. 1. Possible original request came from [facility] and therefore was sent there. 2. Could have been human error and pharmacy not selected correctly. Advised in the future to request all his rx be [facility] to [facility].  Copied from CRM [id number]. Topic: Clinical - Prescription Issue >> Jun 12, 2023 12:19 PM Fredrica W wrote: Reason for CRM: Patient called back in about missing three medications from [facility]. Advised missing medications were sent to [facility] today. Patient would like more information as to why they are not in [facility] and if they will be sent that way next refill. Thank You

## 2023-06-14 ENCOUNTER — Other Ambulatory Visit (HOSPITAL_BASED_OUTPATIENT_CLINIC_OR_DEPARTMENT_OTHER): Payer: Self-pay

## 2023-06-14 DIAGNOSIS — M25552 Pain in left hip: Secondary | ICD-10-CM | POA: Diagnosis not present

## 2023-06-14 DIAGNOSIS — M549 Dorsalgia, unspecified: Secondary | ICD-10-CM | POA: Diagnosis not present

## 2023-06-14 DIAGNOSIS — M47818 Spondylosis without myelopathy or radiculopathy, sacral and sacrococcygeal region: Secondary | ICD-10-CM | POA: Diagnosis not present

## 2023-06-14 DIAGNOSIS — Z79891 Long term (current) use of opiate analgesic: Secondary | ICD-10-CM | POA: Diagnosis not present

## 2023-06-14 DIAGNOSIS — M47816 Spondylosis without myelopathy or radiculopathy, lumbar region: Secondary | ICD-10-CM | POA: Diagnosis not present

## 2023-06-14 DIAGNOSIS — G894 Chronic pain syndrome: Secondary | ICD-10-CM | POA: Diagnosis not present

## 2023-06-14 DIAGNOSIS — Z1389 Encounter for screening for other disorder: Secondary | ICD-10-CM | POA: Diagnosis not present

## 2023-06-14 DIAGNOSIS — M169 Osteoarthritis of hip, unspecified: Secondary | ICD-10-CM | POA: Diagnosis not present

## 2023-06-14 DIAGNOSIS — M48061 Spinal stenosis, lumbar region without neurogenic claudication: Secondary | ICD-10-CM | POA: Diagnosis not present

## 2023-06-14 MED ORDER — OXYCODONE-ACETAMINOPHEN 7.5-325 MG PO TABS
1.0000 | ORAL_TABLET | Freq: Three times a day (TID) | ORAL | 0 refills | Status: DC
  Filled 2023-06-14: qty 90, 30d supply, fill #0

## 2023-06-25 ENCOUNTER — Encounter (HOSPITAL_COMMUNITY): Payer: PPO

## 2023-06-25 ENCOUNTER — Ambulatory Visit: Payer: PPO

## 2023-06-27 NOTE — Progress Notes (Deleted)
 [facility]  [address] [place],  [place]  [address] [phone]  Clinic Day:  02/16/2023  Referring physician: Blane Ohara, MD   HISTORY OF PRESENT ILLNESS:  The patient is a 68 y.o. male with iron deficiency anemia.  He comes in today to reassess his iron and hemoglobin levels after recently receiving IV iron.  Of note, the patient has felt better since his IV iron was given.  He continues to deny having any overt forms of blood loss to explain his past anemia.  Of note, he is scheduled for his GI workup later this month.     PHYSICAL EXAM:  There were no vitals taken for this visit. Wt Readings from Last 3 Encounters:  06/08/23 151 lb (68.5 kg)  06/04/23 154 lb 12.8 oz (70.2 kg)  05/07/23 151 lb (68.5 kg)   There is no height or weight on file to calculate BMI. Performance status (ECOG): 0 - Asymptomatic Physical Exam Constitutional:      Appearance: Normal appearance. He is not ill-appearing.  HENT:     Mouth/Throat:     Mouth: Mucous membranes are moist.     Pharynx: Oropharynx is clear. No oropharyngeal exudate or posterior oropharyngeal erythema.  Cardiovascular:     Rate and Rhythm: Normal rate and regular rhythm.     Heart sounds: No murmur heard.    No friction rub. No gallop.  Pulmonary:     Effort: Pulmonary effort is normal. No respiratory distress.     Breath sounds: Normal breath sounds. No wheezing, rhonchi or rales.  Abdominal:     General: Bowel sounds are normal. There is no distension.     Palpations: Abdomen is soft. There is no mass.     Tenderness: There is no abdominal tenderness.  Musculoskeletal:        General: No swelling.     Right lower leg: No edema.     Left lower leg: No edema.  Lymphadenopathy:     Cervical: No cervical adenopathy.     Upper Body:     Right upper body: No supraclavicular or axillary adenopathy.     Left upper body: No supraclavicular or axillary adenopathy.     Lower Body: No  right inguinal adenopathy. No left inguinal adenopathy.  Skin:    General: Skin is warm.     Coloration: Skin is not jaundiced.     Findings: No lesion or rash.  Neurological:     General: No focal deficit present.     Mental Status: He is alert and oriented to person, place, and time. Mental status is at baseline.  Psychiatric:        Mood and Affect: Mood normal.        Behavior: Behavior normal.        Thought Content: Thought content normal.    LABS:    Latest Reference Range & Units 02/16/23 00:00 02/16/23 14:28  Sodium 135 - 145 mmol/L  136  Potassium 3.5 - 5.1 mmol/L  4.4  Chloride 98 - 111 mmol/L  101  CO2 22 - 32 mmol/L  25  Glucose 70 - 99 mg/dL  914 (H)  BUN 8 - 23 mg/dL  18  Creatinine 7.82 - 1.24 mg/dL  9.56  Calcium 8.9 - 21.3 mg/dL  8.9  Anion gap 5 - 15   10  Alkaline Phosphatase 38 - 126 U/L  85  Albumin 3.5 - 5.0 g/dL  4.0  AST 15 -  41 U/L  15  ALT 0 - 44 U/L  21  Total Protein 6.5 - 8.1 g/dL  6.7  Total Bilirubin 0.3 - 1.2 mg/dL  0.4  GFR, Est Non African American >60 mL/min  >60  Iron 45 - 182 ug/dL  85  UIBC ug/dL  161  TIBC 096 - 045 ug/dL  409  Saturation Ratios 17.9 - 39.5 %  21  Ferritin 24 - 336 ng/mL  15 (L)  CBC W DIFFERENTIAL ([facility] SCANNED REPORT)   Rpt (IP)  WBC  8.6 (E)   RBC 3.87 - 5.11  4.18 (E)   Hemoglobin 13.5 - 17.5  13.3 ! (E)   HCT 41 - 53  39 ! (E)   Platelets 150 - 400 K/uL 240 (E)   NEUT#  5.59 (E)   (H): Data is abnormally high (L): Data is abnormally low !: Data is abnormal  ASSESSMENT & PLAN:  A 68 y.o. male with iron deficiency anemia.  Although his hemoglobin is even better today, his ferritin is low again, which is consistent with persistently low iron stores.  Based upon this, I will arrange for him to receive another course of IV iron.   He knows to keep his GI appointment for later this month.  to have this done within the forthcoming weeks/months.  Otherwise, I will see this patient back in 4 months for  repeat clinical assessment.  The patient understands all the plans discussed today and is in agreement with them.   Danford Tat Kirby Funk, MD

## 2023-06-28 ENCOUNTER — Inpatient Hospital Stay: Payer: PPO

## 2023-06-28 ENCOUNTER — Other Ambulatory Visit: Payer: Self-pay

## 2023-06-28 ENCOUNTER — Inpatient Hospital Stay: Payer: PPO | Admitting: Oncology

## 2023-06-28 DIAGNOSIS — D500 Iron deficiency anemia secondary to blood loss (chronic): Secondary | ICD-10-CM

## 2023-06-29 ENCOUNTER — Other Ambulatory Visit: Payer: Self-pay

## 2023-06-29 DIAGNOSIS — I739 Peripheral vascular disease, unspecified: Secondary | ICD-10-CM

## 2023-06-29 NOTE — Progress Notes (Signed)
 Remote pacemaker transmission.

## 2023-07-03 ENCOUNTER — Ambulatory Visit (INDEPENDENT_AMBULATORY_CARE_PROVIDER_SITE_OTHER): Payer: PPO

## 2023-07-03 VITALS — BP 174/64 | HR 99 | Temp 97.8°F | Ht 70.0 in | Wt 152.6 lb

## 2023-07-03 DIAGNOSIS — D500 Iron deficiency anemia secondary to blood loss (chronic): Secondary | ICD-10-CM | POA: Diagnosis not present

## 2023-07-03 DIAGNOSIS — I739 Peripheral vascular disease, unspecified: Secondary | ICD-10-CM

## 2023-07-03 DIAGNOSIS — I10 Essential (primary) hypertension: Secondary | ICD-10-CM

## 2023-07-03 DIAGNOSIS — F17200 Nicotine dependence, unspecified, uncomplicated: Secondary | ICD-10-CM

## 2023-07-03 DIAGNOSIS — Z0000 Encounter for general adult medical examination without abnormal findings: Secondary | ICD-10-CM | POA: Diagnosis not present

## 2023-07-03 DIAGNOSIS — Z1159 Encounter for screening for other viral diseases: Secondary | ICD-10-CM | POA: Diagnosis not present

## 2023-07-03 DIAGNOSIS — E118 Type 2 diabetes mellitus with unspecified complications: Secondary | ICD-10-CM | POA: Diagnosis not present

## 2023-07-03 DIAGNOSIS — F17210 Nicotine dependence, cigarettes, uncomplicated: Secondary | ICD-10-CM

## 2023-07-03 DIAGNOSIS — E782 Mixed hyperlipidemia: Secondary | ICD-10-CM | POA: Diagnosis not present

## 2023-07-03 DIAGNOSIS — J4489 Other specified chronic obstructive pulmonary disease: Secondary | ICD-10-CM

## 2023-07-03 MED ORDER — ATORVASTATIN CALCIUM 20 MG PO TABS: 20.0000 mg | ORAL_TABLET | Freq: Every evening | ORAL | 1 refills | Status: DC

## 2023-07-03 MED ORDER — METFORMIN HCL 1000 MG PO TABS: 1000.0000 mg | ORAL_TABLET | Freq: Two times a day (BID) | ORAL | 1 refills | Status: DC

## 2023-07-03 MED ORDER — CLOPIDOGREL BISULFATE 75 MG PO TABS: 75.0000 mg | ORAL_TABLET | Freq: Every morning | ORAL | 1 refills | Status: DC

## 2023-07-03 MED ORDER — GABAPENTIN 600 MG PO TABS: 600.0000 mg | ORAL_TABLET | Freq: Three times a day (TID) | ORAL | 0 refills | Status: DC

## 2023-07-03 NOTE — Assessment & Plan Note (Signed)
 Smokes approximately one pack per day, contributing to vascular issues and PAD. Discussed the importance of quitting smoking to improve vascular health and overall prognosis. Explained that medications like varenicline or bupropion can help reduce cravings and aid in smoking cessation. Encouraged gradual reduction in smoking and seeking support for quitting. - Discuss potential use of varenicline or bupropion to aid in smoking cessation - Encouraged gradual reduction in smoking and seeking support for quitting  TIME SPENT COUNSELING 6 MINUTES

## 2023-07-03 NOTE — Assessment & Plan Note (Signed)
 Well controlled  Albuterol inhaler as needed  Strongly stressed on quitting smoking  Ordered low dose lung cancer screening CT

## 2023-07-03 NOTE — Assessment & Plan Note (Signed)
 Elevated blood pressure noted during the visit. Reports normal readings at home. Discussed the importance of regular monitoring and managing blood pressure to prevent further complications. - Recheck blood pressure today which still came back elevated. - he smoked right before coming in.  - Monitor blood pressure at home regularly - currently on Diovan-HCT 160-12.5 mg  - IF HOME BPS STAY ELEVATED, WILL NEED MEDICATION ADJUSTMENT

## 2023-07-03 NOTE — Assessment & Plan Note (Signed)
 General Health Maintenance Routine health maintenance discussed including screenings and vaccinations. Discussed the importance of regular screenings and preventive measures. - Provide advance directive paperwork - Order lung cancer screening CT scan due to smoking history - Confirm recent colonoscopy results and follow-up schedule  Follow-up - Follow-up appointment in three months - Return sooner if needed.

## 2023-07-03 NOTE — Assessment & Plan Note (Signed)
 Chronic anemia with recurrent low hemoglobin levels. Extensive workup including upper and lower GI was unremarkable. Recent significant blood loss during vascular procedures may contribute to anemia. - Order CBC and electrolytes and send results to the cancer center

## 2023-07-03 NOTE — Assessment & Plan Note (Addendum)
 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 Last A1c was 5.5 in February 2024. Discussed the importance of maintaining glycemic control to prevent further vascular complications. - Order hemoglobin A1c - Continue Amaryl (glimepiride) 4 mg once daily - Continue metformin 1000 mg twice daily   Chronic nerve pain and swelling in the foot. Currently on gabapentin 600 mg three times daily. Discussed switching to a single 600 mg pill three times daily to reduce pill burden. - Continue gabapentin 600 mg three times daily - Switch to single 600 mg pill three times daily to reduce pill burden

## 2023-07-03 NOTE — Assessment & Plan Note (Signed)
 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 Underwent left SFA stenting for toe ulcer in January 2023 and left SFA balloon angioplasty in March 2024. Recent procedures include left SFA and left external iliac artery drug-coated angioplasty on June 08, 2023. Reports recurrent blockages and significant blood loss during procedures. Smoking and diabetes are contributing factors. Discussed the importance of smoking cessation and glycemic control to improve vascular health and prognosis. Explained risks of continued smoking, including increased risk of thrombosis, stroke, and myocardial infarction. - Continue aspirin, statin, and Plavix - Plan for angiogram with possible intervention - Order CBC and electrolytes and send results to the cancer center

## 2023-07-03 NOTE — Progress Notes (Signed)
 Subjective:   Andrew Wiggins is a 68 y.o. male who presents for Medicare Annual/Subsequent preventive examination AND FOLLOW UP OF CHRONIC MEDICAL PROBLEMS.  Visit Complete: In person  Andrew Wiggins is a 68 year old male with peripheral artery disease, diabetes, and anemia who presents for a wellness exam and blood work.  He has a significant history of peripheral artery disease, with multiple interventions including left superficial femoral artery stenting and balloon angioplasty. He experiences recurrent blood clots in his left leg, leading to four surgeries. The most recent surgery was complicated by significant bleeding, requiring manual compression, resulting in a large hematoma and bruising in his right lower abdomen and groin. Blood flow was only 30% prior to the last intervention.  He has ongoing issues with anemia, receiving iron infusions at a cancer center due to dropping hemoglobin levels. Despite extensive workup, including upper and lower GI studies, no source of bleeding has been identified.  He manages his diabetes with Amaryl (glimepiride) 4 mg once daily and metformin 1000 mg twice daily. His hemoglobin A1c was 5.5 in February 2024, indicating good control. He also has a history of elevated blood sugar levels.  He smokes about a pack of cigarettes a day and acknowledges that smoking contributes to his vascular issues. He wants to quit smoking but finds it challenging due to pain and limited mobility. He previously reduced his smoking to less than half a pack a day before his back issues worsened.  He takes gabapentin for nerve pain and swelling in his foot, currently at a dose of 600 mg three times daily, which he finds cumbersome due to the number of pills. He also takes oxycodone three times a day for pain management, prescribed by another doctor.  He has a history of high blood pressure, which he monitors at home. He has a pacemaker, which limits his ability to undergo  certain diagnostic imaging like MRI. He had a colonoscopy about four to five months ago, which was normal, and he has not had a recent lung cancer screening CT scan due to his pacemaker.  He reports burning and swelling sensations in his toes, feeling like he is 'walking on a ball'. No abdominal pain but notes soreness from previous medical procedures. He has dry skin and toenail fungus.    Objective:    Today's Vitals   07/03/23 0755 07/03/23 0849  BP: (!) 168/88 (!) 174/64  Pulse: 99   Temp: 97.8 F (36.6 C)   SpO2: 97%   Weight: 152 lb 9.6 oz (69.2 kg)   Height: 5\' 10"  (1.778 m)    Body mass index is 21.9 kg/m.     07/03/2023    8:05 AM 06/08/2023    6:21 AM 03/28/2023    1:48 PM 03/23/2023    1:40 PM 02/16/2023    4:28 PM 10/16/2022    2:44 PM 07/28/2022   10:25 AM  Advanced Directives  Does Patient Have a Medical Advance Directive? No No No No No No No  Would patient like information on creating a medical advance directive?  No - Patient declined     No - Patient declined    Current Medications (verified) Outpatient Encounter Medications as of 07/03/2023  Medication Sig   albuterol (VENTOLIN HFA) 108 (90 Base) MCG/ACT inhaler INHALE 2 PUFFS BY MOUTH EVERY 6 HOURS AS NEEDED FOR WHEEZING OR SHORTNESS OF BREATH   aspirin EC 81 MG tablet Take 81 mg by mouth daily. Swallow whole.  FEROSUL 325 (65 Fe) MG tablet TAKE 1 TABLET BY MOUTH EVERY MORNING *REFILL REQUEST*   gabapentin (NEURONTIN) 600 MG tablet Take 1 tablet (600 mg total) by mouth 3 (three) times daily.   glimepiride (AMARYL) 4 MG tablet TAKE 1 TABLET BY MOUTH EVERY MORNING *REFILL REQUEST*   Omega-3 Fatty Acids (FISH OIL) 1000 MG CAPS Take 2 capsules (2,000 mg total) by mouth 2 (two) times daily. (Patient taking differently: Take 1 capsule by mouth daily.)   oxyCODONE-acetaminophen (PERCOCET) 7.5-325 MG tablet Take 1 tablet by mouth 3 (three) times daily for back pain.   pantoprazole (PROTONIX) 40 MG tablet Take 40 mg  by mouth every morning.   valsartan-hydrochlorothiazide (DIOVAN-HCT) 160-12.5 MG tablet TAKE 1 TABLET BY MOUTH EVERY MORNING *REFILL REQUEST*   [DISCONTINUED] atorvastatin (LIPITOR) 20 MG tablet Take 1 tablet (20 mg total) by mouth every evening.   [DISCONTINUED] clopidogrel (PLAVIX) 75 MG tablet Take 1 tablet (75 mg total) by mouth every morning.   [DISCONTINUED] gabapentin (NEURONTIN) 300 MG capsule TAKE 2 CAPSULES BY MOUTH 3 TIMES DAILY *REFILL REQUEST*   [DISCONTINUED] metFORMIN (GLUCOPHAGE) 1000 MG tablet Take 1 tablet (1,000 mg total) by mouth 2 (two) times daily with a meal.   [DISCONTINUED] oxyCODONE-acetaminophen (PERCOCET) 7.5-325 MG tablet Take 1 tablet by mouth 3 (three) times daily for back pain   atorvastatin (LIPITOR) 20 MG tablet Take 1 tablet (20 mg total) by mouth every evening.   clopidogrel (PLAVIX) 75 MG tablet Take 1 tablet (75 mg total) by mouth every morning.   metFORMIN (GLUCOPHAGE) 1000 MG tablet Take 1 tablet (1,000 mg total) by mouth 2 (two) times daily with a meal.   No facility-administered encounter medications on file as of 07/03/2023.    Allergies (verified) Patient has no known allergies.   History: Past Medical History:  Diagnosis Date   Allergic rhinitis 09/04/2012   Anemia    Arthritis    AV block    history of   Cardiac conduction disorder 11/26/2008    Hx of AV BLOCK (ICD-426.9) & CARDIAC PACEMAKER IN SITU (ICD-V45.01) - he was adm 1/10 w/ symptomatic bradycardias & 2:1 heart block requiring pacemaker- followed by DrTaylor... ~  Jan11:  Last seen by Cards & pacer reprogrammed...     Cardiac pacemaker in situ    Carotid artery disease (HCC) 07/27/2010   R/O PERIPHERAL VASCULAR DISEASE (ICD-443.9) - on ASA 81mg /d... exam 11/09 shows gr 1-2 sys murmur LSB, but also has prominent bruit to base of right side of his neck w/ right > left Carotid Bruit... ~  CDopplers 11/09 showed severe plaque right ICA, & min plaque on left... no signif ICA stenoses per  report. 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 smokes 1/2 - 1ppd x yrs... ~  PFT's 11/08 showed FVC= 3.85 (79%), FEV1= 2.19 (55%), %1sec= 57, mid-flows= 32% predicted:  all c/w mod airflow obstruction...  ~  CXR 12/10 showed left subclav pacer, mild hyperinflation, NAD...     Depression    Dermatitis, contact    due to poison ivy   Diabetes mellitus (HCC) 02/04/2020   Diabetes mellitus with neuropathy (HCC) 12/20/2016   Dyslipidemia 03/06/2012   Encounter for prostate cancer screening  06/21/2022   Essential hypertension 04/10/2007    HYPERTENSION (ICD-401.9) - prev on Micardis 80mg /d then switched to Losartan 100mg /d, but he lost his job in 2011& couldn't afford Rx;  He called [place] 2/12 w/ this news & we phoned in Lisinopril 20mg /d... ~  2DEcho 11/09 showed sl incr LVwall thickness, norm LVF w/ EF= 55-60%, no regional wall motion abn, mild DD, mild MR... ~  Myoview 12/09 showed LBBB, no perfusion defects, abn septal motion & EF   GERD 03/26/2008    GERD (ICD-530.81) - prev mild GERD symptoms that improved after cholecystectomy on 2000... uses OTC PPI as needed... ~  he is due for routine screening colonoscopy...       Iron deficiency anemia due to chronic blood loss 06/21/2022   Mixed hyperlipidemia 06/19/2019   PVD (peripheral vascular disease) (HCC)    Sciatica 11/18/2019   Tobacco use 02/04/2020   Tobacco use disorder    Past Surgical History:  Procedure Laterality Date   ABDOMINAL AORTOGRAM W/LOWER EXTREMITY N/A 12/14/2020   Procedure: ABDOMINAL AORTOGRAM W/LOWER EXTREMITY;  Surgeon: Nada Libman, MD;  Location: [facility];  Service: Cardiovascular;  Laterality: N/A;   ABDOMINAL  AORTOGRAM W/LOWER EXTREMITY N/A 05/24/2021   Procedure: ABDOMINAL AORTOGRAM W/LOWER EXTREMITY;  Surgeon: Nada Libman, MD;  Location: [facility];  Service: Cardiovascular;  Laterality: N/A;   ABDOMINAL AORTOGRAM W/LOWER EXTREMITY N/A 07/28/2022   Procedure: ABDOMINAL AORTOGRAM W/LOWER EXTREMITY;  Surgeon: [name], MD;  Location: [facility];  Service: Cardiovascular;  Laterality: N/A;   ABDOMINAL AORTOGRAM W/LOWER EXTREMITY N/A 06/08/2023   Procedure: ABDOMINAL AORTOGRAM W/LOWER EXTREMITY;  Surgeon: [name], MD;  Location: [facility];  Service: Cardiovascular;  Laterality: N/A;   CATARACT EXTRACTION Bilateral    laproscopic cholecystectomy  05/08/1998   by Dr. Odie Sera   MOUTH SURGERY     PACEMAKER INSERTION  03/08/2008   by Dr. Ladona Ridgel   PERIPHERAL VASCULAR BALLOON ANGIOPLASTY Left 06/08/2023   Procedure: PERIPHERAL VASCULAR BALLOON ANGIOPLASTY;  Surgeon: [name], MD;  Location: [facility];  Service: Cardiovascular;  Laterality: Left;  SFA   PERIPHERAL VASCULAR INTERVENTION Left 12/14/2020   Procedure: PERIPHERAL VASCULAR INTERVENTION;  Surgeon: Nada Libman, MD;  Location: [facility];  Service: Cardiovascular;  Laterality: Left;  SFA   PERIPHERAL VASCULAR INTERVENTION Left 05/24/2021   Procedure: PERIPHERAL VASCULAR INTERVENTION;  Surgeon: Nada Libman, MD;  Location: [facility];  Service: Cardiovascular;  Laterality: Left;   PERIPHERAL VASCULAR INTERVENTION Left 07/28/2022   Procedure: PERIPHERAL VASCULAR INTERVENTION;  Surgeon: [name], MD;  Location: [facility];  Service: Cardiovascular;  Laterality: Left;  Lt SFA   PPM GENERATOR CHANGEOUT N/A 05/25/2021   Procedure: PPM GENERATOR CHANGEOUT;  Surgeon: Regan Lemming, MD;  Location: [facility];  Service: Cardiovascular;  Laterality: N/A;   Family History  Problem Relation Age of Onset   Heart disease Brother    Leukemia  Brother    Social History   Socioeconomic History   Marital status: Legally Separated    Spouse name: Not on file   Number of children: 2   Years of education: 11   Highest education level: Not on file  Occupational History   Occupation: unemployed    Comment: works for himself   Occupation: RETIRED FROM [employer]  Tobacco Use   Smoking status: Every Day    Current packs/day: 0.50  Average packs/day: 0.5 packs/day for 25.0 years (12.5 ttl pk-yrs)    Types: Cigarettes    Passive exposure: Never   Smokeless tobacco: Never  Vaping Use   Vaping status: Never Used  Substance and Sexual Activity   Alcohol use: Not Currently    Alcohol/week: 0.0 standard drinks of alcohol   Drug use: No   Sexual activity: Not Currently  Other Topics Concern   Not on file  Social History Narrative   PT HAS VERY VAGUE FAMILY HISTORY - HE WAS RAISED IN [facility]   Social Drivers of Health   Financial Resource Strain: Low Risk  (07/03/2023)   Overall Financial Resource Strain (CARDIA)    Difficulty of Paying Living Expenses: Not hard at all  Food Insecurity: No Food Insecurity (07/03/2023)   Hunger Vital Sign    Worried About Running Out of Food in the Last Year: Never true    Ran Out of Food in the Last Year: Never true  Transportation Needs: No Transportation Needs (07/03/2023)   PRAPARE - Administrator, Civil Service (Medical): No    Lack of Transportation (Non-Medical): No  Physical Activity: Insufficiently Active (07/03/2023)   Exercise Vital Sign    Days of Exercise per Week: 3 days    Minutes of Exercise per Session: 30 min  Stress: No Stress Concern Present (07/03/2023)   [employer] of Occupational Health - Occupational Stress Questionnaire    Feeling of Stress : Not at all  Social Connections: Socially Isolated (07/03/2023)   Social Connection and Isolation Panel [NHANES]    Frequency of Communication with Friends and Family: More than three times a week     Frequency of Social Gatherings with Friends and Family: Twice a week    Attends Religious Services: Never    Database administrator or Organizations: No    Attends Engineer, structural: Never    Marital Status: Separated    Tobacco Counseling Ready to quit: Not Answered Counseling given: Not Answered   Clinical Intake:  Pre-visit preparation completed: No Review of Systems  Cardiovascular:  Positive for claudication. Negative for leg swelling.  Musculoskeletal:  Positive for myalgias.  Neurological:  Positive for tingling.  All other systems reviewed and are negative.   Pain : No/denies pain   Physical Exam Vitals and nursing note reviewed.  HENT:     Head: Normocephalic and atraumatic.  Cardiovascular:     Rate and Rhythm: Normal rate and regular rhythm.  Pulmonary:     Effort: Pulmonary effort is normal.     Breath sounds: Normal breath sounds.  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     Cervical back: Normal range of motion.  Skin:    General: Skin is dry.     Findings: Erythema present.  Neurological:     General: No focal deficit present.     Mental Status: He is alert.  Psychiatric:        Mood and Affect: Mood normal.      BMI - recorded: 21.9 Nutritional Status: BMI of 19-24  Normal Nutritional Risks: None Diabetes: Yes CBG done?: No Did pt. bring in CBG monitor from home?: No  How often do you need to have someone help you when you read instructions, pamphlets, or other written materials from your doctor or pharmacy?: 1 - Never What is  the last grade level you completed in school?: 11th grade  Interpreter Needed?: No      Activities of Daily Living    07/03/2023    8:06 AM  In your present state of health, do you have any difficulty performing the following activities:  Hearing? 1  Vision? 0   Difficulty concentrating or making decisions? 0  Walking or climbing stairs? 0  Dressing or bathing? 1  Doing errands, shopping? 0  Preparing Food and eating ? N  Using the Toilet? N  In the past six months, have you accidently leaked urine? N  Do you have problems with loss of bowel control? N  Managing your Medications? N  Managing your Finances? N  Housekeeping or managing your Housekeeping? N    Patient Care Team: Windell Moment, MD as PCP - General (Family Medicine) Regan Lemming, MD as PCP - Electrophysiology (Cardiology) Zettie Pho, [facility] (Inactive) (Pharmacist) Revankar, Aundra Dubin, MD as Consulting Physician (Cardiology)  Indicate any recent Medical Services you may have received from other than [facility] providers in the past year (date may be approximate).     Assessment:   This is a routine wellness examination for [place].  Hearing/Vision screen No results found.   Goals Addressed   None   Depression Screen    07/03/2023    8:06 AM 07/03/2023    8:05 AM 06/30/2022    4:13 PM 06/21/2022    9:24 AM 06/21/2022    8:40 AM 01/26/2021   10:46 AM 01/12/2021   10:20 AM  PHQ 2/9 Scores  PHQ - 2 Score 0 0 0 0 0 0 0    Fall Risk    07/03/2023    8:06 AM 06/21/2022    9:24 AM 06/21/2022    8:39 AM 01/26/2021   10:46 AM 01/12/2021   10:20 AM  Fall Risk   Falls in the past year? 0 0 0 0 0  Number falls in past yr: 0 0 0 0 0  Injury with Fall? 0 0 0 0 0  Risk for fall due to : No Fall Risks  No Fall Risks      MEDICARE RISK AT HOME: Medicare Risk at Home Any stairs in or around the home?: No If so, are there any without handrails?: No Home free of loose throw rugs in walkways, pet beds, electrical cords, etc?: Yes Adequate lighting in your home to reduce risk of falls?: Yes Life alert?: No Use of a cane, walker or w/c?: No Grab bars in the bathroom?: No Shower chair or bench in shower?: Yes Elevated toilet seat or a handicapped toilet?: No    Cognitive  Function:        07/03/2023    8:07 AM 06/16/2020    1:56 PM  6CIT Screen  What Year? 0 points 0 points  What month? 0 points 0 points  What time? 0 points 0 points  Count back from 20 0 points 0 points  Months in reverse 0 points 0 points  Repeat phrase 0 points 0 points  Total Score 0 points 0 points    Immunizations Immunization History  Administered Date(s) Administered   Influenza Whole 04/15/2009   Influenza,inj,Quad PF,6+ Mos 06/07/2015, 06/07/2016   Pneumococcal Polysaccharide-23 04/15/2009     Screening Tests Health Maintenance  Topic Date Due   COVID-19 Vaccine (1) Never done   Hepatitis C Screening  Never done   DTaP/Tdap/Td (1 - Tdap) Never done   Zoster Vaccines- Shingrix (  1 of 2) Never done   OPHTHALMOLOGY EXAM  10/29/2022   HEMOGLOBIN A1C  12/20/2022   Diabetic kidney evaluation - Urine ACR  12/24/2022   INFLUENZA VACCINE  08/06/2023 (Originally 12/07/2022)   Pneumonia Vaccine 66+ Years old (2 of 2 - PCV) 07/02/2024 (Originally 04/15/2010)   Diabetic kidney evaluation - eGFR measurement  02/16/2024   FOOT EXAM  07/02/2024   Medicare Annual Wellness (AWV)  07/02/2024   Colonoscopy  03/07/2033   HPV VACCINES  Aged Out    Health Maintenance  Health Maintenance Due  Topic Date Due   COVID-19 Vaccine (1) Never done   Hepatitis C Screening  Never done   DTaP/Tdap/Td (1 - Tdap) Never done   Zoster Vaccines- Shingrix (1 of 2) Never done   OPHTHALMOLOGY EXAM  10/29/2022   HEMOGLOBIN A1C  12/20/2022   Diabetic kidney evaluation - Urine ACR  12/24/2022    Colorectal cancer screening: Type of screening: Colonoscopy. Completed 2024. Repeat every 5-10 years  Lung Cancer Screening: (Low Dose CT Chest recommended if Age 83-80 years, 20 pack-year currently smoking OR have quit w/in 15years.) does qualify.   Lung Cancer Screening Referral: ordered CT scan  Additional Screening:  Hepatitis C Screening: does qualify; Completed today  Vision Screening:  Recommended annual ophthalmology exams for early detection of glaucoma and other disorders of the eye. Is the patient up to date with their annual eye exam?  Yes  Who is the provider or what is the name of the office in which the patient attends annual eye exams?  If pt is not established with a provider, would they like to be referred to a provider to establish care? No .   Dental Screening: Recommended annual dental exams for proper oral hygiene  Diabetic Foot Exam: Diabetic Foot Exam: Completed 07/03/23  Community Resource Referral / Chronic Care Management: CRR required this visit?  No   CCM required this visit?  No     Plan:     Encounter for Medicare annual wellness exam Assessment & Plan: General Health Maintenance Routine health maintenance discussed including screenings and vaccinations. Discussed the importance of regular screenings and preventive measures. - Provide advance directive paperwork - Order lung cancer screening CT scan due to smoking history - Confirm recent colonoscopy results and follow-up schedule  Follow-up - Follow-up appointment in three months - Return sooner if needed.   PVD (peripheral vascular disease) (HCC) Assessment & Plan: Chronic condition with significant stenosis in the mid SFA stent on the left. Underwent left SFA stenting for toe ulcer in January 2023 and left SFA balloon angioplasty in March 2024. Recent procedures include left SFA and left external iliac artery drug-coated angioplasty on June 08, 2023. Reports recurrent blockages and significant blood loss during procedures. Smoking and diabetes are contributing factors. Discussed the importance of smoking cessation and glycemic control to improve vascular health and prognosis. Explained risks of continued smoking, including increased risk of thrombosis, stroke, and myocardial infarction. - Continue aspirin, statin, and Plavix - Plan for angiogram with possible intervention - Order  CBC and electrolytes and send results to the cancer center   Orders: -     Clopidogrel Bisulfate; Take 1 tablet (75 mg total) by mouth every morning.  Dispense: 90 tablet; Refill: 1 -     Comprehensive metabolic panel -     CBC with Differential/Platelet  Mixed hyperlipidemia Assessment & Plan: Low HDL and acceptable LDL levels. Currently on atorvastatin 20 mg daily. Discussed the importance of maintaining  cholesterol levels to prevent further vascular issues. - Order lipid panel - Continue atorvastatin 20 mg daily - Consider increasing dose if cholesterol levels are not controlled  Orders: -     Atorvastatin Calcium; Take 1 tablet (20 mg total) by mouth every evening.  Dispense: 90 tablet; Refill: 1 -     Comprehensive metabolic panel -     Lipid panel  COPD (chronic obstructive pulmonary disease) with chronic bronchitis (HCC) Assessment & Plan: Well controlled  Albuterol inhaler as needed  Strongly stressed on quitting smoking  Ordered low dose lung cancer screening CT   Controlled type 2 diabetes mellitus with complication, without long-term current use of insulin (HCC) Assessment & Plan: Type 2 diabetes CONTROLLED WITH COMPLICATIONS OF PERIPHERAL ARTERY DISEASE AND PERIPHERAL NEUROPATHY,  with previously elevated A1c, now controlled with current medications. Last A1c was 5.5 in February 2024. Discussed the importance of maintaining glycemic control to prevent further vascular complications. - Order hemoglobin A1c - Continue Amaryl (glimepiride) 4 mg once daily - Continue metformin 1000 mg twice daily   Chronic nerve pain and swelling in the foot. Currently on gabapentin 600 mg three times daily. Discussed switching to a single 600 mg pill three times daily to reduce pill burden. - Continue gabapentin 600 mg three times daily - Switch to single 600 mg pill three times daily to reduce pill burden  Orders: -     Hemoglobin A1c  Iron deficiency anemia due to chronic  blood loss Assessment & Plan: Chronic anemia with recurrent low hemoglobin levels. Extensive workup including upper and lower GI was unremarkable. Recent significant blood loss during vascular procedures may contribute to anemia. - Order CBC and electrolytes and send results to the cancer center   Orders: -     Iron, TIBC and Ferritin Panel  Tobacco use disorder Assessment & Plan: Smokes approximately one pack per day, contributing to vascular issues and PAD. Discussed the importance of quitting smoking to improve vascular health and overall prognosis. Explained that medications like varenicline or bupropion can help reduce cravings and aid in smoking cessation. Encouraged gradual reduction in smoking and seeking support for quitting. - Discuss potential use of varenicline or bupropion to aid in smoking cessation - Encouraged gradual reduction in smoking and seeking support for quitting  TIME SPENT COUNSELING 6 MINUTES  Orders: -     CT CHEST LUNG CANCER SCREENING LOW DOSE WO CONTRAST; Future  Need for hepatitis C screening test -     Hepatitis C antibody  Essential hypertension Assessment & Plan: Elevated blood pressure noted during the visit. Reports normal readings at home. Discussed the importance of regular monitoring and managing blood pressure to prevent further complications. - Recheck blood pressure today which still came back elevated. - he smoked right before coming in.  - Monitor blood pressure at home regularly - currently on Diovan-HCT 160-12.5 mg  - IF HOME BPS STAY ELEVATED, WILL NEED MEDICATION ADJUSTMENT   Other orders -     metFORMIN HCl; Take 1 tablet (1,000 mg total) by mouth 2 (two) times daily with a meal.  Dispense: 180 tablet; Refill: 1 -     Gabapentin; Take 1 tablet (600 mg total) by mouth 3 (three) times daily.  Dispense: 270 tablet; Refill: 0     I have personally reviewed and noted the following in the patient's chart:   Medical and social  history Use of alcohol, tobacco or illicit drugs  Current medications and supplements including opioid prescriptions. Patient is  currently taking opioid prescriptions. Information provided to patient regarding non-opioid alternatives. Patient advised to discuss non-opioid treatment plan with their provider. Functional ability and status Nutritional status Physical activity Advanced directives List of other physicians Hospitalizations, surgeries, and ER visits in previous 12 months Vitals Screenings to include cognitive, depression, and falls Referrals and appointments  In addition, I have reviewed and discussed with patient certain preventive protocols, quality metrics, and best practice recommendations. A written personalized care plan for preventive services as well as general preventive health recommendations were provided to patient.     Windell Moment, MD   07/03/2023   After Visit Summary: (In Person-Printed) AVS printed and given to the patient  Nurse Notes:

## 2023-07-03 NOTE — Assessment & Plan Note (Signed)
 Low HDL and acceptable LDL levels. Currently on atorvastatin 20 mg daily. Discussed the importance of maintaining cholesterol levels to prevent further vascular issues. - Order lipid panel - Continue atorvastatin 20 mg daily - Consider increasing dose if cholesterol levels are not controlled

## 2023-07-03 NOTE — Patient Instructions (Signed)
 Andrew Wiggins , Thank you for taking time to come for your Medicare Wellness Visit. I appreciate your ongoing commitment to your health goals. Please review the following plan we discussed and let me know if I can assist you in the future.   These are the goals we discussed:  Goals      Increase physical activity     Quit Smoking     Set My Target A1C-Diabetes Type 2     Timeframe:  Long-Range Goal Priority:  High Start Date:                             Expected End Date:                       Follow Up Date 03/14/22   - set target A1C    Why is this important?   Your target A1C is decided together by you and your doctor.  It is based on several things like your age and other health issues.    Notes:      Track and Manage My Blood Pressure-Hypertension     Timeframe:  Long-Range Goal Priority:  High Start Date:                             Expected End Date:                       Follow Up Date 03/14/22   - choose a place to take my blood pressure (home, clinic or office, retail store) - write blood pressure results in a log or diary    Why is this important?   You won't feel high blood pressure, but it can still hurt your blood vessels.  High blood pressure can cause heart or kidney problems. It can also cause a stroke.  Making lifestyle changes like losing a little weight or eating less salt will help.  Checking your blood pressure at home and at different times of the day can help to control blood pressure.  If the doctor prescribes medicine remember to take it the way the doctor ordered.  Call the office if you cannot afford the medicine or if there are questions about it.     Notes:         This is a list of the screening recommended for you and due dates:  Health Maintenance  Topic Date Due   COVID-19 Vaccine (1) Never done   Hepatitis C Screening  Never done   DTaP/Tdap/Td vaccine (1 - Tdap) Never done   Zoster (Shingles) Vaccine (1 of 2) Never done   Eye exam  for diabetics  10/29/2022   Hemoglobin A1C  12/20/2022   Yearly kidney health urinalysis for diabetes  12/24/2022   Complete foot exam   06/22/2023   Flu Shot  08/06/2023*   Pneumonia Vaccine (2 of 2 - PCV) 07/02/2024*   Yearly kidney function blood test for diabetes  02/16/2024   Medicare Annual Wellness Visit  07/02/2024   Colon Cancer Screening  03/07/2033   HPV Vaccine  Aged Out  *Topic was postponed. The date shown is not the original due date.           Managing Pain Without Opioids Opioids are strong medicines used to treat moderate to severe pain. For some people, especially those who have long-term (  chronic) pain, opioids may not be the best choice for pain management due to: Side effects like nausea, constipation, and sleepiness. The risk of addiction (opioid use disorder). The longer you take opioids, the greater your risk of addiction. Pain that lasts for more than 3 months is called chronic pain. Managing chronic pain usually requires more than one approach and is often provided by a team of health care providers working together (multidisciplinary approach). Pain management may be done at a pain management center or pain clinic. How to manage pain without the use of opioids Use non-opioid medicines Non-opioid medicines for pain may include: Over-the-counter or prescription non-steroidal anti-inflammatory drugs (NSAIDs). These may be the first medicines used for pain. They work well for muscle and bone pain, and they reduce swelling. Acetaminophen. This over-the-counter medicine may work well for milder pain but not swelling. Antidepressants. These may be used to treat chronic pain. A certain type of antidepressant (tricyclics) is often used. These medicines are given in lower doses for pain than when used for depression. Anticonvulsants. These are usually used to treat seizures but may also reduce nerve (neuropathic) pain. Muscle relaxants. These relieve pain caused by  sudden muscle tightening (spasms). You may also use a pain medicine that is applied to the skin as a patch, cream, or gel (topical analgesic), such as a numbing medicine. These may cause fewer side effects than medicines taken by mouth. Do certain therapies as directed Some therapies can help with pain management. They include: Physical therapy. You will do exercises to gain strength and flexibility. A physical therapist may teach you exercises to move and stretch parts of your body that are weak, stiff, or painful. You can learn these exercises at physical therapy visits and practice them at home. Physical therapy may also involve: Massage. Heat wraps or applying heat or cold to affected areas. Electrical signals that interrupt pain signals (transcutaneous electrical nerve stimulation, TENS). Weak lasers that reduce pain and swelling (low-level laser therapy). Signals from your body that help you learn to regulate pain (biofeedback). Occupational therapy. This helps you to learn ways to function at home and work with less pain. Recreational therapy. This involves trying new activities or hobbies, such as a physical activity or drawing. Mental health therapy, including: Cognitive behavioral therapy (CBT). This helps you learn coping skills for dealing with pain. Acceptance and commitment therapy (ACT) to change the way you think and react to pain. Relaxation therapies, including muscle relaxation exercises and mindfulness-based stress reduction. Pain management counseling. This may be individual, family, or group counseling.  Receive medical treatments Medical treatments for pain management include: Nerve block injections. These may include a pain blocker and anti-inflammatory medicines. You may have injections: Near the spine to relieve chronic back or neck pain. Into joints to relieve back or joint pain. Into nerve areas that supply a painful area to relieve body pain. Into muscles (trigger  point injections) to relieve some painful muscle conditions. A medical device placed near your spine to help block pain signals and relieve nerve pain or chronic back pain (spinal cord stimulation device). Acupuncture. Follow these instructions at home Medicines Take over-the-counter and prescription medicines only as told by your health care provider. If you are taking pain medicine, ask your health care providers about possible side effects to watch out for. Do not drive or use heavy machinery while taking prescription opioid pain medicine. Lifestyle  Do not use drugs or alcohol to reduce pain. If you drink alcohol, limit how  much you have to: 0-1 drink a day for women who are not pregnant. 0-2 drinks a day for men. Know how much alcohol is in a drink. In the [place] one drink equals one 12 oz bottle of beer (355 mL), one 5 oz glass of wine (148 mL), or one 1 oz glass of hard liquor (44 mL). Do not use any products that contain nicotine or tobacco. These products include cigarettes, chewing tobacco, and vaping devices, such as e-cigarettes. If you need help quitting, ask your health care provider. Eat a healthy diet and maintain a healthy weight. Poor diet and excess weight may make pain worse. Eat foods that are high in fiber. These include fresh fruits and vegetables, whole grains, and beans. Limit foods that are high in fat and processed sugars, such as fried and sweet foods. Exercise regularly. Exercise lowers stress and may help relieve pain. Ask your health care provider what activities and exercises are safe for you. If your health care provider approves, join an exercise class that combines movement and stress reduction. Examples include yoga and tai chi. Get enough sleep. Lack of sleep may make pain worse. Lower stress as much as possible. Practice stress reduction techniques as told by your therapist. General instructions Work with all your pain management providers to find the  treatments that work best for you. You are an important member of your pain management team. There are many things you can do to reduce pain on your own. Consider joining an online or in-person support group for people who have chronic pain. Keep all follow-up visits. This is important. Where to find more information You can find more information about managing pain without opioids from: American Academy of Pain Medicine: painmed.org Institute for Chronic Pain: instituteforchronicpain.org American Chronic Pain Association: theacpa.org Contact a health care provider if: You have side effects from pain medicine. Your pain gets worse or does not get better with treatments or home therapy. You are struggling with anxiety or depression. Summary Many types of pain can be managed without opioids. Chronic pain may respond better to pain management without opioids. Pain is best managed when you and a team of health care providers work together. Pain management without opioids may include non-opioid medicines, medical treatments, physical therapy, mental health therapy, and lifestyle changes. Tell your health care providers if your pain gets worse or is not being managed well enough. This information is not intended to replace advice given to you by your health care provider. Make sure you discuss any questions you have with your health care provider. Document Revised: 08/04/2020 Document Reviewed: 08/04/2020 Elsevier Patient Education  2024 [employer].

## 2023-07-04 ENCOUNTER — Other Ambulatory Visit: Payer: Self-pay | Admitting: Oncology

## 2023-07-04 DIAGNOSIS — D500 Iron deficiency anemia secondary to blood loss (chronic): Secondary | ICD-10-CM

## 2023-07-04 LAB — COMPREHENSIVE METABOLIC PANEL
ALT: 17 IU/L (ref 0–44)
AST: 19 IU/L (ref 0–40)
Albumin: 4.7 g/dL (ref 3.9–4.9)
Alkaline Phosphatase: 115 IU/L (ref 44–121)
BUN/Creatinine Ratio: 12 (ref 10–24)
BUN: 12 mg/dL (ref 8–27)
Bilirubin Total: 0.4 mg/dL (ref 0.0–1.2)
CO2: 23 mmol/L (ref 20–29)
Calcium: 9.9 mg/dL (ref 8.6–10.2)
Chloride: 101 mmol/L (ref 96–106)
Creatinine, Ser: 1 mg/dL (ref 0.76–1.27)
Globulin, Total: 2.4 g/dL (ref 1.5–4.5)
Glucose: 103 mg/dL — ABNORMAL HIGH (ref 70–99)
Potassium: 5 mmol/L (ref 3.5–5.2)
Sodium: 142 mmol/L (ref 134–144)
Total Protein: 7.1 g/dL (ref 6.0–8.5)
eGFR: 82 mL/min/{1.73_m2} (ref >59)

## 2023-07-04 LAB — CBC WITH DIFFERENTIAL/PLATELET
Basophils Absolute: 0 10*3/uL (ref 0.0–0.2)
Basos: 0 %
EOS (ABSOLUTE): 0.1 10*3/uL (ref 0.0–0.4)
Eos: 1 %
Hematocrit: 42.8 % (ref 37.5–51.0)
Hemoglobin: 14.3 g/dL (ref 13.0–17.7)
Immature Grans (Abs): 0.1 10*3/uL (ref 0.0–0.1)
Immature Granulocytes: 1 %
Lymphocytes Absolute: 2.5 10*3/uL (ref 0.7–3.1)
Lymphs: 21 %
MCH: 32.4 pg (ref 26.6–33.0)
MCHC: 33.4 g/dL (ref 31.5–35.7)
MCV: 97 fL (ref 79–97)
Monocytes Absolute: 1 10*3/uL — ABNORMAL HIGH (ref 0.1–0.9)
Monocytes: 9 %
Neutrophils Absolute: 8.3 10*3/uL — ABNORMAL HIGH (ref 1.4–7.0)
Neutrophils: 68 %
Platelets: 302 10*3/uL (ref 150–450)
RBC: 4.42 x10E6/uL (ref 4.14–5.80)
RDW: 12.9 % (ref 11.6–15.4)
WBC: 12 10*3/uL — ABNORMAL HIGH (ref 3.4–10.8)

## 2023-07-04 LAB — LIPID PANEL
Chol/HDL Ratio: 4.4 ratio (ref 0.0–5.0)
Cholesterol, Total: 129 mg/dL (ref 100–199)
HDL: 29 mg/dL — ABNORMAL LOW (ref >39)
LDL Chol Calc (NIH): 62 mg/dL (ref 0–99)
Triglycerides: 233 mg/dL — ABNORMAL HIGH (ref 0–149)
VLDL Cholesterol Cal: 38 mg/dL (ref 5–40)

## 2023-07-04 LAB — HEPATITIS C ANTIBODY: Hep C Virus Ab: NONREACTIVE

## 2023-07-04 LAB — IRON,TIBC AND FERRITIN PANEL
Ferritin: 85 ng/mL (ref 30–400)
Iron Saturation: 67 % — ABNORMAL HIGH (ref 15–55)
Iron: 243 ug/dL — ABNORMAL HIGH (ref 38–169)
Total Iron Binding Capacity: 363 ug/dL (ref 250–450)
UIBC: 120 ug/dL (ref 111–343)

## 2023-07-04 LAB — HEMOGLOBIN A1C
Est. average glucose Bld gHb Est-mCnc: 194 mg/dL
Hgb A1c MFr Bld: 8.4 % — ABNORMAL HIGH (ref 4.8–5.6)

## 2023-07-04 NOTE — Progress Notes (Signed)
 [facility]  [address] [place],  [place]  [address] [phone]  Clinic Day:  07/05/2023  Referring physician: Mercy Stall, MD   HISTORY OF PRESENT ILLNESS:  The patient is a 68 y.o. male with iron  deficiency anemia.  He comes in today to reassess his iron  and hemoglobin levels after recently receiving IV iron  in November 2024.  Of note, the patient has felt better since his IV iron  was given.  He continues to deny having any overt forms of blood loss to explain his past anemia.  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 rate 16, height 5\' 10"  (1.778 m), weight 152 lb 6.4 oz (69.1 kg), SpO2 96%. Wt Readings from Last 3 Encounters:  07/17/23 153 lb (69.4 kg)  07/05/23 152 lb 6.4 oz (69.1 kg)  07/03/23 152 lb 9.6 oz (69.2 kg)   Body mass index is 21.87 kg/m. Performance status (ECOG): 0 - Asymptomatic Physical Exam Constitutional:      Appearance: Normal appearance. He is not ill-appearing.  HENT:     Mouth/Throat:     Mouth: Mucous membranes are moist.     Pharynx: Oropharynx is clear. No oropharyngeal exudate or posterior oropharyngeal erythema.  Cardiovascular:     Rate and Rhythm: Normal rate and regular rhythm.     Heart sounds: No murmur heard.    No friction rub. No gallop.  Pulmonary:     Effort: Pulmonary effort is normal. No respiratory distress.     Breath sounds: Normal breath sounds. No wheezing, rhonchi or rales.  Abdominal:     General: Bowel sounds are normal. There is no distension.     Palpations: Abdomen is soft. There is no mass.     Tenderness: There is no abdominal tenderness.  Musculoskeletal:        General: No swelling.     Right lower leg: No edema.     Left lower leg: No edema.  Lymphadenopathy:     Cervical: No cervical adenopathy.     Upper Body:      Right upper body: No supraclavicular or axillary adenopathy.     Left upper body: No supraclavicular or axillary adenopathy.     Lower Body: No right inguinal adenopathy. No left inguinal adenopathy.  Skin:    General: Skin is warm.     Coloration: Skin is not jaundiced.     Findings: No lesion or rash.  Neurological:     General: No focal deficit present.     Mental Status: He is alert and oriented to person, place, and time. Mental status is at baseline.  Psychiatric:        Mood and Affect: Mood normal.        Behavior: Behavior normal.        Thought Content: Thought content normal.    LABS:    Latest Reference Range & Units 07/03/23 08:54  WBC 3.4 - 10.8 x10E3/uL 12.0 (H)  RBC 4.14 - 5.80 x10E6/uL 4.42  Hemoglobin 13.0 - 17.7 g/dL 62.9  HCT 52.8 - 41.3 % 42.8  MCV 79 - 97 fL 97  MCH 26.6 - 33.0 pg 32.4  MCHC 31.5 - 35.7 g/dL 24.4  RDW 01.0 - 27.2 % 12.9  Platelets 150 - 450 x10E3/uL 302  (H): Data is abnormally high   Latest Reference  Range & Units 07/03/23 08:54  Iron  38 - 169 ug/dL 500 (H)  UIBC 938 - 182 ug/dL 993  TIBC 716 - 967 ug/dL 893  Ferritin 30 - 810 ng/mL 85  Iron  Saturation 15 - 55 % 67 (H)  (H): Data is abnormally high  ASSESSMENT & PLAN:  A 68 y.o. male with iron  deficiency anemia.  His hemoglobin and iron  levels have clearly improved since receiving his IV iron . Clinically, he is doing much better.  I will see this patient back in 6 months for repeat clinical assessment.  The patient understands all the plans discussed today and is in agreement with them.   Kamill Fulbright Felicia Horde, MD

## 2023-07-05 ENCOUNTER — Other Ambulatory Visit: Payer: Self-pay | Admitting: Oncology

## 2023-07-05 ENCOUNTER — Other Ambulatory Visit: Payer: Self-pay

## 2023-07-05 ENCOUNTER — Inpatient Hospital Stay: Payer: PPO | Attending: Oncology | Admitting: Oncology

## 2023-07-05 ENCOUNTER — Inpatient Hospital Stay: Payer: PPO

## 2023-07-05 VITALS — BP 158/67 | HR 99 | Temp 98.1°F | Resp 16 | Ht 70.0 in | Wt 152.4 lb

## 2023-07-05 DIAGNOSIS — D509 Iron deficiency anemia, unspecified: Secondary | ICD-10-CM | POA: Diagnosis not present

## 2023-07-05 DIAGNOSIS — E782 Mixed hyperlipidemia: Secondary | ICD-10-CM

## 2023-07-05 DIAGNOSIS — D500 Iron deficiency anemia secondary to blood loss (chronic): Secondary | ICD-10-CM

## 2023-07-05 MED ORDER — ATORVASTATIN CALCIUM 40 MG PO TABS: 40.0000 mg | ORAL_TABLET | Freq: Every day | ORAL | 3 refills | Status: DC

## 2023-07-06 ENCOUNTER — Ambulatory Visit (HOSPITAL_COMMUNITY): Payer: PPO

## 2023-07-06 ENCOUNTER — Telehealth: Payer: Self-pay | Admitting: Oncology

## 2023-07-06 NOTE — Telephone Encounter (Signed)
 Contacted pt to schedule an appt. Unable to reach via phone, voicemail was left.   Follow-up disposition: Return in about 26 weeks (around 01/03/2024).  Check out comments: labs

## 2023-07-10 ENCOUNTER — Encounter: Payer: PPO | Admitting: Vascular Surgery

## 2023-07-10 NOTE — Telephone Encounter (Signed)
 Contacted pt to schedule an appt. Unable to reach via phone, voicemail was left.

## 2023-07-13 ENCOUNTER — Ambulatory Visit (HOSPITAL_BASED_OUTPATIENT_CLINIC_OR_DEPARTMENT_OTHER)
Admission: RE | Admit: 2023-07-13 | Discharge: 2023-07-13 | Disposition: A | Discharge status: Home / Self Care | Source: Ambulatory Visit

## 2023-07-13 DIAGNOSIS — F17210 Nicotine dependence, cigarettes, uncomplicated: Secondary | ICD-10-CM | POA: Diagnosis not present

## 2023-07-13 DIAGNOSIS — Z87891 Personal history of nicotine dependence: Secondary | ICD-10-CM | POA: Diagnosis not present

## 2023-07-13 DIAGNOSIS — F17200 Nicotine dependence, unspecified, uncomplicated: Secondary | ICD-10-CM

## 2023-07-15 NOTE — Progress Notes (Unsigned)
 HISTORY AND PHYSICAL     CC:  follow up. Primary Care Doctor:  Andrew Ohara, MD  HPI: This is a 68 y.o. male who is here today for follow up for PAD.  Pt has hx of left SFA stenting for toe ulcer in January 2023 and left SFA drug coated balloon angioplasty for rest pain by Dr. Myra Wiggins.  On 07/28/2022, he underwent angiogram with left SFA angioplasty and stenting for LLE ulceration by Dr. Lenell Wiggins.  Pt was last seen 05/07/2023 and at that time, he was having some erythema and cold feeling in the left foot.  He woke up with right leg  pain. He was found to have some cellulitis and was started on Keflex and brought back for studies.   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   The pt returns today for follow up.  He states that he took two weeks of abx and he feels the redness got better and has started getting red again.  He does not have any sores on his feet, but at night he does get numbness in his foot and it feels better to hang his foot off the bed and put on the cold floor.  He states the redness gets better when his leg is elevated and gets worse when his foot is hanging down.  He states that he does get some burning in the 4th and 5th toes. He states in the past 2-3 days has started getting severe cramping in the left calf with walking.  He does not think he could walk a city block due to this. He states this stent has been working for 10 months.   He does continue to smoke but says he is trying to quit.    is compliant with his asa/statin/plavix.   He has hx of PPM.   The pt is on a statin for cholesterol management.    The pt is on an aspirin.    Other AC:  Plavix The pt is on ARB, diuretic for hypertension.  The pt is is on medication for diabetes. Tobacco hx:  current  Pt does not have family hx of AAA.  07/17/23: patient returns after left superficial femoral artery drug-coated angioplasty (5x193mm INPact), and left external iliac artery drug-coated angioplasty (6x93mm  INPact) 06/08/23. He reports resolution of symptoms in left lower extremity. He is overall doing well. No claudication. No rest pain. No ulceration.   Past Medical History:  Diagnosis Date   Allergic rhinitis 09/04/2012   Anemia    Arthritis    AV block    history of   Cardiac conduction disorder 11/26/2008    Hx of AV BLOCK (ICD-426.9) & CARDIAC PACEMAKER IN SITU (ICD-V45.01) - he was adm 1/10 w/ symptomatic bradycardias & 2:1 heart block requiring pacemaker- followed by Andrew Wiggins... ~  Jan11:  Last seen by Cards & pacer reprogrammed...     Cardiac pacemaker in situ    Carotid artery disease (HCC) 07/27/2010   R/O PERIPHERAL VASCULAR DISEASE (ICD-443.9) - on ASA 81mg /d... exam 11/09 shows gr 1-2 sys murmur LSB, but also has prominent bruit to base of right side of his neck w/ right > left Carotid Bruit... ~  CDopplers 11/09 showed severe plaque right ICA, & min plaque on left... no signif ICA stenoses per report. 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 smokes 1/2 - 1ppd x yrs... ~  PFT's 11/08 showed FVC= 3.85 (79%), FEV1= 2.19 (55%), %1sec= 57, mid-flows= 32% predicted:  all c/w mod airflow obstruction...  ~  CXR 12/10 showed left subclav pacer, mild hyperinflation, NAD...     Depression    Dermatitis, contact    due to poison ivy   Diabetes mellitus (HCC) 02/04/2020   Diabetes mellitus with neuropathy (HCC) 12/20/2016   Dyslipidemia 03/06/2012   Encounter for prostate cancer screening 06/21/2022   Essential hypertension 04/10/2007    HYPERTENSION (ICD-401.9) - prev on Micardis 80mg /d then switched to Losartan 100mg /d, but he lost his job in 2011& couldn't afford Rx;  He called [place] 2/12 w/ this news &  we phoned in Lisinopril 20mg /d... ~  2DEcho 11/09 showed sl incr LVwall thickness, norm LVF w/ EF= 55-60%, no regional wall motion abn, mild DD, mild MR... ~  Myoview 12/09 showed LBBB, no perfusion defects, abn septal motion & EF   GERD 03/26/2008    GERD (ICD-530.81) - prev mild GERD symptoms that improved after cholecystectomy on 2000... uses OTC PPI as needed... ~  he is due for routine screening colonoscopy...       Iron deficiency anemia due to chronic blood loss 06/21/2022   Mixed hyperlipidemia 06/19/2019   PVD (peripheral vascular disease) (HCC)    Sciatica 11/18/2019   Tobacco use 02/04/2020   Tobacco use disorder     Past Surgical History:  Procedure Laterality Date   ABDOMINAL AORTOGRAM W/LOWER EXTREMITY N/A 12/14/2020   Procedure: ABDOMINAL AORTOGRAM W/LOWER EXTREMITY;  Surgeon: Andrew Libman, MD;  Location: [facility];  Service: Cardiovascular;  Laterality: N/A;   ABDOMINAL AORTOGRAM W/LOWER EXTREMITY N/A 05/24/2021   Procedure: ABDOMINAL AORTOGRAM W/LOWER EXTREMITY;  Surgeon: Andrew Libman, MD;  Location: [facility];  Service: Cardiovascular;  Laterality: N/A;   ABDOMINAL AORTOGRAM W/LOWER EXTREMITY N/A 07/28/2022   Procedure: ABDOMINAL AORTOGRAM W/LOWER EXTREMITY;  Surgeon: Andrew Douglas, MD;  Location: [facility];  Service: Cardiovascular;  Laterality: N/A;   ABDOMINAL AORTOGRAM W/LOWER EXTREMITY N/A 06/08/2023   Procedure: ABDOMINAL AORTOGRAM W/LOWER EXTREMITY;  Surgeon: Andrew Douglas, MD;  Location: [facility];  Service: Cardiovascular;  Laterality: N/A;   CATARACT EXTRACTION Bilateral    laproscopic cholecystectomy  05/08/1998   by Dr. Odie Wiggins   MOUTH SURGERY     PACEMAKER INSERTION  03/08/2008   by Dr. Ladona Wiggins   PERIPHERAL VASCULAR BALLOON ANGIOPLASTY Left 06/08/2023   Procedure: PERIPHERAL VASCULAR BALLOON ANGIOPLASTY;  Surgeon: Andrew Douglas, MD;  Location: [facility];  Service: Cardiovascular;  Laterality: Left;   SFA   PERIPHERAL VASCULAR INTERVENTION Left 12/14/2020   Procedure: PERIPHERAL VASCULAR INTERVENTION;  Surgeon: Andrew Libman, MD;  Location: [facility];  Service: Cardiovascular;  Laterality: Left;  SFA   PERIPHERAL VASCULAR INTERVENTION Left 05/24/2021   Procedure: PERIPHERAL VASCULAR INTERVENTION;  Surgeon: Andrew Libman, MD;  Location: [facility];  Service: Cardiovascular;  Laterality: Left;   PERIPHERAL VASCULAR INTERVENTION Left 07/28/2022   Procedure: PERIPHERAL VASCULAR INTERVENTION;  Surgeon: Andrew Douglas, MD;  Location: [facility];  Service: Cardiovascular;  Laterality: Left;  Lt SFA   PPM GENERATOR CHANGEOUT N/A 05/25/2021   Procedure: PPM GENERATOR CHANGEOUT;  Surgeon: Regan Lemming, MD;  Location: [facility];  Service: Cardiovascular;  Laterality: N/A;    No Known Allergies  Current Outpatient Medications  Medication Sig Dispense Refill   albuterol (VENTOLIN HFA) 108 (90 Base) MCG/ACT inhaler INHALE 2 PUFFS BY MOUTH EVERY 6 HOURS AS NEEDED FOR WHEEZING OR SHORTNESS OF BREATH 18 g 0   aspirin EC 81 MG tablet Take 81 mg by mouth daily. Swallow whole.     atorvastatin (LIPITOR) 40 MG tablet Take 1 tablet (40 mg total) by mouth daily. 90 tablet 3   clopidogrel (PLAVIX) 75 MG tablet Take 1 tablet (75 mg total) by mouth every morning. 90 tablet 1   FEROSUL 325 (65 Fe) MG tablet TAKE 1 TABLET BY MOUTH EVERY MORNING *REFILL REQUEST* 30 tablet 10   gabapentin (NEURONTIN) 600 MG tablet Take 1 tablet (600 mg total) by mouth 3 (three) times daily. 270 tablet 0   glimepiride (AMARYL) 4 MG tablet TAKE 1 TABLET BY MOUTH EVERY MORNING *REFILL REQUEST* 30 tablet 10   metFORMIN (GLUCOPHAGE) 1000 MG tablet Take 1 tablet (1,000 mg total) by mouth 2 (two) times daily with a meal. 180 tablet 1   Omega-3 Fatty Acids (FISH OIL) 1000 MG CAPS Take 2 capsules (2,000 mg total) by mouth 2 (two) times daily. (Patient taking differently: Take 1 capsule by mouth  daily.) 180 capsule 12   oxyCODONE-acetaminophen (PERCOCET) 7.5-325 MG tablet Take 1 tablet by mouth 3 (three) times daily for back pain. 90 tablet 0   pantoprazole (PROTONIX) 40 MG tablet Take 40 mg by mouth every morning.     valsartan-hydrochlorothiazide (DIOVAN-HCT) 160-12.5 MG tablet TAKE 1 TABLET BY MOUTH EVERY MORNING *REFILL REQUEST* 30 tablet 10   No current facility-administered medications for this visit.    Family History  Problem Relation Age of Onset   Heart disease Brother    Leukemia Brother     Social History   Socioeconomic History   Marital status: Legally Separated    Spouse name: Not on file   Number of children: 2   Years of education: 11   Highest education level: Not on file  Occupational History   Occupation: unemployed    Comment: works for himself   Occupation: RETIRED FROM [employer]  Tobacco Use   Smoking status: Every Day    Current packs/day: 0.50    Average packs/day: 0.5 packs/day for 25.0 years (12.5 ttl pk-yrs)    Types: Cigarettes    Passive exposure: Never   Smokeless tobacco: Never  Vaping Use   Vaping status: Never Used  Substance and Sexual Activity   Alcohol use: Not Currently    Alcohol/week: 0.0 standard drinks of alcohol   Drug use: No   Sexual activity: Not Currently  Other Topics Concern   Not on file  Social History Narrative   PT HAS VERY VAGUE FAMILY HISTORY - HE WAS RAISED IN [facility]   Social Drivers of Health   Financial Resource Strain: Low Risk  (07/03/2023)   Overall Financial Resource Strain (CARDIA)    Difficulty of Paying Living Expenses: Not hard at all  Food Insecurity: No Food Insecurity (07/03/2023)   Hunger Vital Sign    Worried About Running Out of Food in the Last Year: Never true    Ran Out of Food in the Last Year: Never true  Transportation Needs: No Transportation Needs (07/03/2023)   PRAPARE - Administrator, Civil Service (Medical): No    Lack of Transportation (Non-Medical):  No  Physical Activity: Insufficiently Active (07/03/2023)   Exercise Vital Sign    Days of Exercise per Week: 3 days    Minutes of Exercise per Session: 30 min  Stress: No Stress Concern Present (07/03/2023)   [employer] of Occupational Health - Occupational Stress Questionnaire    Feeling of Stress : Not at all  Social Connections: Socially Isolated (07/03/2023)   Social Connection and Isolation Panel [NHANES]    Frequency of Communication with Friends and Family: More than three times a week    Frequency of Social Gatherings with Friends and Family: Twice a week    Attends Religious Services: Never    Database administrator or Organizations: No    Attends Banker Meetings: Never    Marital Status: Separated  Intimate Partner Violence: Not At Risk (07/03/2023)   Humiliation, Afraid, Rape, and Kick questionnaire    Fear of Current or Ex-Partner: No    Emotionally Abused: No    Physically Abused: No    Sexually Abused: No    PHYSICAL EXAMINATION:  Today's Vitals   07/17/23 0920  BP: (!) 178/84  Pulse: (!) 103  Resp: (!) 95  Temp: 98.2 F (36.8 C)  Weight: 153 lb (69.4 kg)  Height: 5\' 10"  (1.778 m)    Body mass index is 21.95 kg/m.  Chronically ill man in no distress Regular rate and rhythm Unlabored breathing Brisk doppler flow in L PT and AT Brisk cap refill <2 sec  Non-Invasive Vascular Imaging:   +-------+-----------+-----------+------------+------------+  ABI/TBIToday's ABIToday's TBIPrevious ABIPrevious TBI  +-------+-----------+-----------+------------+------------+  Right 0.90       0.57       0.78        0.54          +-------+-----------+-----------+------------+------------+  Left  0.97       0.66       0.3         0             +-------+-----------+-----------+------------+------------+   Left lower extremity duplex: patent left stent with no visualized stenosis.    ASSESSMENT/PLAN:  Andrew Wiggins is a 68 y.o.  male with atherosclerosis of native arteries of left lower extremity status post angioplasty. Good technical result.   Recommend:  Abstinence from all tobacco products. Blood glucose control with goal A1c < 7%. Blood pressure control with goal blood pressure < 140/90 mmHg. Lipid reduction therapy with goal LDL-C <100 mg/dL Aspirin 81mg  PO QD.  Clopidogrel 75mg  PO QD. Atorvastatin 40-80mg  PO QD (or other "high intensity" statin therapy).  Follow up with me or PA in 6 months with ABI and LLE duplex  Rande Brunt. Andrew Antu, MD [facility] Phone Number: [phone] 07/17/2023 12:58 PM

## 2023-07-17 ENCOUNTER — Ambulatory Visit (INDEPENDENT_AMBULATORY_CARE_PROVIDER_SITE_OTHER)
Admission: RE | Admit: 2023-07-17 | Discharge: 2023-07-17 | Disposition: A | Discharge status: Home / Self Care | Payer: PPO | Source: Ambulatory Visit | Attending: Vascular Surgery

## 2023-07-17 ENCOUNTER — Encounter: Payer: Self-pay | Admitting: Vascular Surgery

## 2023-07-17 ENCOUNTER — Ambulatory Visit (HOSPITAL_COMMUNITY)
Admission: RE | Admit: 2023-07-17 | Discharge: 2023-07-17 | Disposition: A | Discharge status: Home / Self Care | Payer: PPO | Source: Ambulatory Visit | Attending: Vascular Surgery | Admitting: Vascular Surgery

## 2023-07-17 ENCOUNTER — Ambulatory Visit (INDEPENDENT_AMBULATORY_CARE_PROVIDER_SITE_OTHER): Payer: PPO | Admitting: Vascular Surgery

## 2023-07-17 ENCOUNTER — Other Ambulatory Visit (HOSPITAL_BASED_OUTPATIENT_CLINIC_OR_DEPARTMENT_OTHER): Payer: Self-pay

## 2023-07-17 VITALS — BP 178/84 | HR 103 | Temp 98.2°F | Resp 95 | Ht 70.0 in | Wt 153.0 lb

## 2023-07-17 DIAGNOSIS — I70222 Atherosclerosis of native arteries of extremities with rest pain, left leg: Secondary | ICD-10-CM

## 2023-07-17 DIAGNOSIS — M169 Osteoarthritis of hip, unspecified: Secondary | ICD-10-CM | POA: Diagnosis not present

## 2023-07-17 DIAGNOSIS — M25552 Pain in left hip: Secondary | ICD-10-CM | POA: Diagnosis not present

## 2023-07-17 DIAGNOSIS — Z1389 Encounter for screening for other disorder: Secondary | ICD-10-CM | POA: Diagnosis not present

## 2023-07-17 DIAGNOSIS — G894 Chronic pain syndrome: Secondary | ICD-10-CM | POA: Diagnosis not present

## 2023-07-17 DIAGNOSIS — I739 Peripheral vascular disease, unspecified: Secondary | ICD-10-CM

## 2023-07-17 DIAGNOSIS — M549 Dorsalgia, unspecified: Secondary | ICD-10-CM | POA: Diagnosis not present

## 2023-07-17 DIAGNOSIS — Z79891 Long term (current) use of opiate analgesic: Secondary | ICD-10-CM | POA: Diagnosis not present

## 2023-07-17 DIAGNOSIS — M47818 Spondylosis without myelopathy or radiculopathy, sacral and sacrococcygeal region: Secondary | ICD-10-CM | POA: Diagnosis not present

## 2023-07-17 DIAGNOSIS — M47816 Spondylosis without myelopathy or radiculopathy, lumbar region: Secondary | ICD-10-CM | POA: Diagnosis not present

## 2023-07-17 DIAGNOSIS — M48061 Spinal stenosis, lumbar region without neurogenic claudication: Secondary | ICD-10-CM | POA: Diagnosis not present

## 2023-07-17 LAB — VAS US ABI WITH/WO TBI
Left ABI: 0.97
Right ABI: 0.9

## 2023-07-17 MED ORDER — MORPHINE SULFATE 15 MG PO TABS
7.5000 mg | ORAL_TABLET | Freq: Three times a day (TID) | ORAL | 0 refills | Status: DC
  Filled 2023-07-17: qty 21, 14d supply, fill #0

## 2023-07-17 MED ORDER — NALOXONE HCL 4 MG/0.1ML NA LIQD
NASAL | 0 refills | Status: AC
Start: 2023-07-17 — End: ?
  Filled 2023-07-17: qty 2, 2d supply, fill #0

## 2023-07-18 ENCOUNTER — Telehealth: Payer: Self-pay

## 2023-07-18 NOTE — Telephone Encounter (Signed)
 Patient was identified as falling into the [place] Measure - Diabetes.   Patient was: Appointment scheduled with primary care provider in the next 30 days.   Patient last A1C increased to 8.4.  3 month follow-up appointment on schedule.

## 2023-07-20 ENCOUNTER — Other Ambulatory Visit: Payer: Self-pay | Admitting: *Deleted

## 2023-07-20 DIAGNOSIS — I739 Peripheral vascular disease, unspecified: Secondary | ICD-10-CM

## 2023-07-20 DIAGNOSIS — I70222 Atherosclerosis of native arteries of extremities with rest pain, left leg: Secondary | ICD-10-CM

## 2023-07-20 NOTE — Telephone Encounter (Signed)
 Patient has been scheduled. Aware of appt date and time.

## 2023-07-30 ENCOUNTER — Other Ambulatory Visit (HOSPITAL_BASED_OUTPATIENT_CLINIC_OR_DEPARTMENT_OTHER): Payer: Self-pay

## 2023-07-30 MED ORDER — MORPHINE SULFATE 15 MG PO TABS
7.5000 mg | ORAL_TABLET | Freq: Three times a day (TID) | ORAL | 0 refills | Status: DC
  Filled 2023-07-30: qty 45, 30d supply, fill #0

## 2023-08-14 ENCOUNTER — Other Ambulatory Visit (HOSPITAL_BASED_OUTPATIENT_CLINIC_OR_DEPARTMENT_OTHER): Payer: Self-pay

## 2023-08-14 DIAGNOSIS — Z1389 Encounter for screening for other disorder: Secondary | ICD-10-CM | POA: Diagnosis not present

## 2023-08-14 DIAGNOSIS — M169 Osteoarthritis of hip, unspecified: Secondary | ICD-10-CM | POA: Diagnosis not present

## 2023-08-14 DIAGNOSIS — M47818 Spondylosis without myelopathy or radiculopathy, sacral and sacrococcygeal region: Secondary | ICD-10-CM | POA: Diagnosis not present

## 2023-08-14 DIAGNOSIS — Z79891 Long term (current) use of opiate analgesic: Secondary | ICD-10-CM | POA: Diagnosis not present

## 2023-08-14 DIAGNOSIS — G894 Chronic pain syndrome: Secondary | ICD-10-CM | POA: Diagnosis not present

## 2023-08-14 DIAGNOSIS — M48061 Spinal stenosis, lumbar region without neurogenic claudication: Secondary | ICD-10-CM | POA: Diagnosis not present

## 2023-08-14 DIAGNOSIS — M549 Dorsalgia, unspecified: Secondary | ICD-10-CM | POA: Diagnosis not present

## 2023-08-14 DIAGNOSIS — M47816 Spondylosis without myelopathy or radiculopathy, lumbar region: Secondary | ICD-10-CM | POA: Diagnosis not present

## 2023-08-14 DIAGNOSIS — M25552 Pain in left hip: Secondary | ICD-10-CM | POA: Diagnosis not present

## 2023-08-14 MED ORDER — MORPHINE SULFATE 15 MG PO TABS
7.5000 mg | ORAL_TABLET | Freq: Three times a day (TID) | ORAL | 0 refills | Status: DC
  Filled 2023-08-28: qty 45, 30d supply, fill #0
  Filled ????-??-??: fill #0

## 2023-08-24 ENCOUNTER — Encounter: Payer: Self-pay | Admitting: Oncology

## 2023-08-24 ENCOUNTER — Ambulatory Visit (INDEPENDENT_AMBULATORY_CARE_PROVIDER_SITE_OTHER): Payer: PPO

## 2023-08-24 DIAGNOSIS — I4430 Unspecified atrioventricular block: Secondary | ICD-10-CM | POA: Diagnosis not present

## 2023-08-25 LAB — CUP PACEART REMOTE DEVICE CHECK
Battery Remaining Longevity: 131 mo
Battery Voltage: 3.02 V
Brady Statistic AP VP Percent: 1.03 %
Brady Statistic AP VS Percent: 0 %
Brady Statistic AS VP Percent: 98.95 %
Brady Statistic AS VS Percent: 0.03 %
Brady Statistic RA Percent Paced: 1.04 %
Brady Statistic RV Percent Paced: 99.97 %
Date Time Interrogation Session: 20250418015629
Implantable Lead Connection Status: 753985
Implantable Lead Connection Status: 753985
Implantable Lead Implant Date: 20091120
Implantable Lead Implant Date: 20091120
Implantable Lead Location: 753859
Implantable Lead Location: 753860
Implantable Lead Model: 5076
Implantable Lead Model: 5076
Implantable Pulse Generator Implant Date: 20230118
Lead Channel Impedance Value: 304 Ohm
Lead Channel Impedance Value: 361 Ohm
Lead Channel Impedance Value: 722 Ohm
Lead Channel Impedance Value: 779 Ohm
Lead Channel Pacing Threshold Amplitude: 0.75 V
Lead Channel Pacing Threshold Amplitude: 0.75 V
Lead Channel Pacing Threshold Pulse Width: 0.4 ms
Lead Channel Pacing Threshold Pulse Width: 0.4 ms
Lead Channel Sensing Intrinsic Amplitude: 2.25 mV
Lead Channel Sensing Intrinsic Amplitude: 2.25 mV
Lead Channel Sensing Intrinsic Amplitude: 6.75 mV
Lead Channel Setting Pacing Amplitude: 1.5 V
Lead Channel Setting Pacing Amplitude: 2 V
Lead Channel Setting Pacing Pulse Width: 0.4 ms
Lead Channel Setting Sensing Sensitivity: 0.9 mV
Zone Setting Status: 755011
Zone Setting Status: 755011

## 2023-08-28 ENCOUNTER — Other Ambulatory Visit (HOSPITAL_BASED_OUTPATIENT_CLINIC_OR_DEPARTMENT_OTHER): Payer: Self-pay

## 2023-08-28 ENCOUNTER — Encounter: Payer: Self-pay | Admitting: Internal Medicine

## 2023-08-30 ENCOUNTER — Other Ambulatory Visit: Payer: Self-pay

## 2023-08-31 ENCOUNTER — Other Ambulatory Visit: Payer: Self-pay

## 2023-09-03 ENCOUNTER — Encounter: Payer: Self-pay | Admitting: Oncology

## 2023-09-03 ENCOUNTER — Other Ambulatory Visit (HOSPITAL_COMMUNITY): Payer: Self-pay

## 2023-09-03 ENCOUNTER — Other Ambulatory Visit: Payer: Self-pay

## 2023-09-03 ENCOUNTER — Other Ambulatory Visit: Payer: Self-pay | Admitting: Family Medicine

## 2023-09-03 DIAGNOSIS — I739 Peripheral vascular disease, unspecified: Secondary | ICD-10-CM

## 2023-09-03 DIAGNOSIS — D500 Iron deficiency anemia secondary to blood loss (chronic): Secondary | ICD-10-CM

## 2023-09-03 DIAGNOSIS — E782 Mixed hyperlipidemia: Secondary | ICD-10-CM

## 2023-09-03 DIAGNOSIS — E0840 Diabetes mellitus due to underlying condition with diabetic neuropathy, unspecified: Secondary | ICD-10-CM

## 2023-09-03 MED ORDER — FERROUS SULFATE 325 (65 FE) MG PO TABS
ORAL_TABLET | ORAL | 10 refills | Status: DC
  Filled 2023-09-03 – 2023-09-05 (×2): qty 30, 30d supply, fill #0
  Filled ????-??-??: fill #0

## 2023-09-03 MED ORDER — VALSARTAN-HYDROCHLOROTHIAZIDE 160-12.5 MG PO TABS
ORAL_TABLET | ORAL | 10 refills | Status: DC
  Filled 2023-09-03 – 2023-09-05 (×2): qty 30, 30d supply, fill #0
  Filled ????-??-??: fill #0

## 2023-09-03 MED ORDER — GLIMEPIRIDE 4 MG PO TABS
ORAL_TABLET | ORAL | 10 refills | Status: DC
  Filled 2023-09-03 – 2023-09-05 (×2): qty 30, 30d supply, fill #0
  Filled ????-??-??: fill #0

## 2023-09-04 ENCOUNTER — Other Ambulatory Visit: Payer: Self-pay

## 2023-09-04 ENCOUNTER — Other Ambulatory Visit (HOSPITAL_COMMUNITY): Payer: Self-pay

## 2023-09-04 MED ORDER — METFORMIN HCL 1000 MG PO TABS
1000.0000 mg | ORAL_TABLET | Freq: Two times a day (BID) | ORAL | 1 refills | Status: DC
  Filled 2023-09-04 – 2023-09-05 (×2): qty 180, 90d supply, fill #0
  Filled ????-??-??: fill #0

## 2023-09-04 MED ORDER — CLOPIDOGREL BISULFATE 75 MG PO TABS
75.0000 mg | ORAL_TABLET | Freq: Every morning | ORAL | 1 refills | Status: DC
  Filled 2023-09-04 – 2023-09-05 (×2): qty 90, 90d supply, fill #0
  Filled ????-??-??: fill #0

## 2023-09-04 MED ORDER — GABAPENTIN 600 MG PO TABS
600.0000 mg | ORAL_TABLET | Freq: Three times a day (TID) | ORAL | 0 refills | Status: DC
  Filled 2023-09-04 – 2023-09-05 (×2): qty 270, 90d supply, fill #0

## 2023-09-04 MED ORDER — ATORVASTATIN CALCIUM 40 MG PO TABS
40.0000 mg | ORAL_TABLET | Freq: Every day | ORAL | 3 refills | Status: DC
  Filled 2023-09-04: qty 90, 90d supply, fill #0

## 2023-09-05 ENCOUNTER — Other Ambulatory Visit (HOSPITAL_COMMUNITY): Payer: Self-pay

## 2023-09-05 ENCOUNTER — Other Ambulatory Visit: Payer: Self-pay

## 2023-09-10 ENCOUNTER — Other Ambulatory Visit: Payer: Self-pay

## 2023-09-13 ENCOUNTER — Other Ambulatory Visit: Payer: Self-pay

## 2023-09-24 DIAGNOSIS — G894 Chronic pain syndrome: Secondary | ICD-10-CM | POA: Diagnosis not present

## 2023-09-24 DIAGNOSIS — M47816 Spondylosis without myelopathy or radiculopathy, lumbar region: Secondary | ICD-10-CM | POA: Diagnosis not present

## 2023-10-02 NOTE — Progress Notes (Signed)
 Remote pacemaker transmission.

## 2023-10-02 NOTE — Addendum Note (Signed)
 Addended by: Lott Rouleau A on: 10/02/2023 03:53 PM   Modules accepted: Orders

## 2023-10-03 ENCOUNTER — Other Ambulatory Visit (HOSPITAL_BASED_OUTPATIENT_CLINIC_OR_DEPARTMENT_OTHER): Payer: Self-pay

## 2023-10-03 MED ORDER — MORPHINE SULFATE 15 MG PO TABS
7.5000 mg | ORAL_TABLET | Freq: Three times a day (TID) | ORAL | 0 refills | Status: DC | PRN
  Filled 2023-10-03: qty 45, 30d supply, fill #0

## 2023-10-04 ENCOUNTER — Other Ambulatory Visit (HOSPITAL_BASED_OUTPATIENT_CLINIC_OR_DEPARTMENT_OTHER): Payer: Self-pay

## 2023-10-04 MED ORDER — OXYCODONE-ACETAMINOPHEN 7.5-325 MG PO TABS
1.0000 | ORAL_TABLET | Freq: Three times a day (TID) | ORAL | 0 refills | Status: DC | PRN
  Filled 2023-10-04: qty 90, 30d supply, fill #0

## 2023-10-05 ENCOUNTER — Other Ambulatory Visit (HOSPITAL_BASED_OUTPATIENT_CLINIC_OR_DEPARTMENT_OTHER): Payer: Self-pay

## 2023-10-08 ENCOUNTER — Ambulatory Visit: Payer: PPO

## 2023-10-08 VITALS — BP 150/72 | HR 90 | Temp 97.5°F | Ht 70.0 in | Wt 151.8 lb

## 2023-10-08 DIAGNOSIS — E1340 Other specified diabetes mellitus with diabetic neuropathy, unspecified: Secondary | ICD-10-CM | POA: Diagnosis not present

## 2023-10-08 DIAGNOSIS — J4489 Other specified chronic obstructive pulmonary disease: Secondary | ICD-10-CM

## 2023-10-08 DIAGNOSIS — I70249 Atherosclerosis of native arteries of left leg with ulceration of unspecified site: Secondary | ICD-10-CM | POA: Diagnosis not present

## 2023-10-08 DIAGNOSIS — I10 Essential (primary) hypertension: Secondary | ICD-10-CM | POA: Diagnosis not present

## 2023-10-08 DIAGNOSIS — Z95811 Presence of heart assist device: Secondary | ICD-10-CM | POA: Diagnosis not present

## 2023-10-08 DIAGNOSIS — E1165 Type 2 diabetes mellitus with hyperglycemia: Secondary | ICD-10-CM | POA: Insufficient documentation

## 2023-10-08 DIAGNOSIS — E782 Mixed hyperlipidemia: Secondary | ICD-10-CM

## 2023-10-08 DIAGNOSIS — D500 Iron deficiency anemia secondary to blood loss (chronic): Secondary | ICD-10-CM

## 2023-10-08 DIAGNOSIS — G894 Chronic pain syndrome: Secondary | ICD-10-CM | POA: Insufficient documentation

## 2023-10-08 DIAGNOSIS — M47816 Spondylosis without myelopathy or radiculopathy, lumbar region: Secondary | ICD-10-CM | POA: Insufficient documentation

## 2023-10-08 MED ORDER — CONTINUOUS GLUCOSE MONITOR SUP MISC: 0 refills | Status: AC

## 2023-10-08 NOTE — Assessment & Plan Note (Signed)
-  As above.

## 2023-10-08 NOTE — Assessment & Plan Note (Signed)
 BP elevated on 2 different readings. Currently on DIOVAN  HCT 160-12.5 mg daily. Heavy smoker.  Recommend to monitor home BP measures and Report back if any worsening symptoms or call 911 for any severe symptoms or for medication adjustments.

## 2023-10-08 NOTE — Assessment & Plan Note (Addendum)
 Well controlled  Albuterol  inhaler as needed  Strongly stressed on quitting smoking or even cutting back further  Tobacco Use Current smoker, approximately 15 cigarettes per day. Advised on financial and health benefits of smoking cessation, including potential cost savings for health management tools like the glucose monitor. - Encourage reduction in smoking with the goal of cessation.

## 2023-10-08 NOTE — Progress Notes (Signed)
 Subjective:  Patient ID: Andrew Wiggins, male    DOB: 1955/10/02  Age: 68 y.o. MRN: [id number]  Chief Complaint  Patient presents with   Medical Management of Chronic Issues    HPI: Discussed the use of AI scribe software for clinical note transcription with the patient, who gave verbal consent to proceed.  History of Present Illness   Delton L Pineda is a 68 year old male with diabetes and peripheral neuropathy who presents for follow-up of his leg circulation and blood sugar management.  His leg circulation has been stable since the last appointment. His foot remains very sensitive to touch but lacks normal sensation, described as feeling 'round' rather than flat, affecting his balance. He has a history of losing a toe due to poor circulation.  He is not currently using diabetic shoes but wants to obtain them for better support and balance. He recalls a previous visit to a foot care place for toenail trimming.  His blood sugar levels have been elevated, with a recent A1c of 8.4%. He experiences episodes of hypoglycemia, characterized by sweating and shaking, occurring at any time of day. He manages these episodes by consuming sugary drinks or snacks. During these episodes, his blood sugar has been in the 70s. He checks his blood sugar using a traditional glucose meter.  He has a history of smoking, currently smoking about 15 cigarettes a day, and acknowledges the need to reduce his smoking. 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  He mentions a history of working  where he walked extensively, averaging 20 to 30 miles a day, which he believes contributed to his current leg issues.         10/08/2023    1:24 PM 07/03/2023    8:06 AM 07/03/2023    8:05 AM 06/30/2022    4:13 PM 06/21/2022    9:24 AM  Depression screen PHQ 2/9  Decreased Interest 0 0 0 0 0  Down, Depressed, Hopeless 0 0 0 0 0  PHQ - 2 Score 0 0 0 0 0  Altered sleeping 0      Tired, decreased energy  0      Change in appetite 0      Feeling bad or failure about yourself  0      Trouble concentrating 0      Moving slowly or fidgety/restless 0      Suicidal thoughts 0      PHQ-9 Score 0      Difficult doing work/chores Not difficult at all            10/08/2023    1:24 PM  Fall Risk   Falls in the past year? 0  Number falls in past yr: 0  Injury with Fall? 0  Risk for fall due to : No Fall Risks    Patient Care Team: Kinsler Soeder, MD as PCP - General (Family Medicine) Lei Pump, MD as PCP - Electrophysiology (Cardiology) Devon Fogo, [facility] (Inactive) (Pharmacist) Revankar, Micael Adas, MD as Consulting Physician (Cardiology)   Review of Systems  Constitutional:  Negative for chills, fatigue and fever.  HENT:  Negative for congestion, ear pain, sinus pressure and sore throat.   Respiratory:  Negative for cough and shortness of breath.   Cardiovascular:  Negative for chest pain.  Gastrointestinal:  Negative for abdominal pain, constipation, diarrhea, nausea and vomiting.  Genitourinary:  Negative for dysuria and frequency.  Musculoskeletal:  Negative for  arthralgias, back pain and myalgias.  Neurological:  Negative for dizziness and headaches.  Psychiatric/Behavioral:  Negative for dysphoric mood. The patient is not nervous/anxious.     Current Outpatient Medications on File Prior to Visit  Medication Sig Dispense Refill   albuterol  (VENTOLIN  HFA) 108 (90 Base) MCG/ACT inhaler INHALE 2 PUFFS BY MOUTH EVERY 6 HOURS AS NEEDED FOR WHEEZING OR SHORTNESS OF BREATH 18 g 0   aspirin  EC 81 MG tablet Take 81 mg by mouth daily. Swallow whole.     atorvastatin  (LIPITOR) 40 MG tablet Take 1 tablet (40 mg total) by mouth daily. 90 tablet 3   clopidogrel  (PLAVIX ) 75 MG tablet Take 1 tablet (75 mg total) by mouth every morning. 90 tablet 1   ferrous sulfate  (FEROSUL) 325 (65 FE) MG tablet TAKE 1 TABLET BY MOUTH EVERY MORNING *REFILL REQUEST* 30 tablet 10   gabapentin   (NEURONTIN ) 600 MG tablet TAKE 1 TABLET BY MOUTH 3 TIMES DAILY 270 tablet 11   glimepiride  (AMARYL ) 4 MG tablet TAKE 1 TABLET BY MOUTH EVERY MORNING *REFILL REQUEST* 30 tablet 10   metFORMIN  (GLUCOPHAGE ) 1000 MG tablet Take 1 tablet (1,000 mg total) by mouth 2 (two) times daily with a meal. 180 tablet 1   naloxone  (NARCAN ) nasal spray 4 mg/0.1 mL 1 Spray As Needed in nostril for accidental overdose- call 911 if used 2 each 0   Omega-3 Fatty Acids (FISH OIL ) 1000 MG CAPS Take 2 capsules (2,000 mg total) by mouth 2 (two) times daily. (Patient taking differently: Take 1 capsule by mouth daily.) 180 capsule 12   oxyCODONE -acetaminophen  (PERCOCET) 7.5-325 MG tablet Take 1 tablet by mouth 3 (three) times daily for back pain. 90 tablet 0   oxyCODONE -acetaminophen  (PERCOCET) 7.5-325 MG tablet Take 1 tablet by mouth every 8 (eight) hours as needed for pain. 90 tablet 0   pantoprazole (PROTONIX) 40 MG tablet Take 40 mg by mouth every morning.     valsartan -hydrochlorothiazide  (DIOVAN -HCT) 160-12.5 MG tablet TAKE 1 TABLET BY MOUTH EVERY MORNING *REFILL REQUEST* 30 tablet 10   No current facility-administered medications on file prior to visit.   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 ~  Jan11:  Last seen by Cards & pacer reprogrammed...     Cardiac pacemaker in situ    Carotid artery disease (HCC) 07/27/2010   R/O PERIPHERAL VASCULAR DISEASE (ICD-443.9) - on ASA 81mg /d... exam 11/09 shows gr 1-2 sys murmur LSB, but also has prominent bruit to base of right side of his neck w/ right > left Carotid Bruit... ~  CDopplers 11/09 showed severe plaque right ICA, & min plaque on left... no signif ICA stenoses per  report. 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 smokes 1/2 - 1ppd x yrs... ~  PFT's 11/08 showed FVC= 3.85 (79%), FEV1= 2.19 (55%), %1sec= 57, mid-flows= 32% predicted:  all c/w mod  airflow obstruction...  ~  CXR 12/10 showed left subclav pacer, mild hyperinflation, NAD...     Depression    Dermatitis, contact    due to poison ivy   Diabetes mellitus (HCC) 02/04/2020   Diabetes mellitus with neuropathy (HCC) 12/20/2016   Dyslipidemia 03/06/2012   Encounter for prostate cancer screening 06/21/2022   Essential hypertension 04/10/2007    HYPERTENSION (ICD-401.9) - prev on Micardis 80mg /d then switched to Losartan 100mg /d, but he lost his job in 2011& couldn't afford Rx;  He called us  2/12 w/ this news & we phoned in Lisinopril  20mg /d... ~  2DEcho 11/09 showed sl incr LVwall thickness, norm LVF w/ EF= 55-60%, no regional wall motion abn, mild DD, mild MR... ~  Myoview  12/09 showed LBBB, no perfusion defects, abn septal motion & EF   GERD 03/26/2008    GERD (ICD-530.81) - prev mild GERD symptoms that improved after cholecystectomy on 2000... uses OTC PPI as needed... ~  he is due for routine screening colonoscopy...       Iron  deficiency anemia due to chronic blood loss 06/21/2022   Mixed hyperlipidemia 06/19/2019   PVD (peripheral vascular disease) (HCC)    Sciatica 11/18/2019   Tobacco use 02/04/2020   Tobacco use disorder    Past Surgical History:  Procedure Laterality Date   ABDOMINAL AORTOGRAM W/LOWER EXTREMITY N/A 12/14/2020   Procedure: ABDOMINAL AORTOGRAM W/LOWER EXTREMITY;  Surgeon: Margherita Shell, MD;  Location: [facility];  Service: Cardiovascular;  Laterality: N/A;   ABDOMINAL  AORTOGRAM W/LOWER EXTREMITY N/A 05/24/2021   Procedure: ABDOMINAL AORTOGRAM W/LOWER EXTREMITY;  Surgeon: Margherita Shell, MD;  Location: [facility];  Service: Cardiovascular;  Laterality: N/A;   ABDOMINAL AORTOGRAM W/LOWER EXTREMITY N/A 07/28/2022   Procedure: ABDOMINAL AORTOGRAM W/LOWER EXTREMITY;  Surgeon: Carlene Che, MD;  Location: [facility];  Service: Cardiovascular;  Laterality: N/A;   ABDOMINAL AORTOGRAM W/LOWER EXTREMITY N/A 06/08/2023   Procedure: ABDOMINAL AORTOGRAM W/LOWER EXTREMITY;  Surgeon: Carlene Che, MD;  Location: [facility];  Service: Cardiovascular;  Laterality: N/A;   CATARACT EXTRACTION Bilateral    laproscopic cholecystectomy  05/08/1998   by Dr. Stephany Ehrich   MOUTH SURGERY     PACEMAKER INSERTION  03/08/2008   by Dr. Carolynne Citron   PERIPHERAL VASCULAR BALLOON ANGIOPLASTY Left 06/08/2023   Procedure: PERIPHERAL VASCULAR BALLOON ANGIOPLASTY;  Surgeon: Carlene Che, MD;  Location[facility];  Service: Cardiovascular;  Laterality: Left;  SFA   PERIPHERAL VASCULAR INTERVENTION Left 12/14/2020   Procedure: PERIPHERAL VASCULAR INTERVENTION;  Surgeon: Margherita Shell, MD;  Location: [facility];  Service: Cardiovascular;  Laterality: Left;  SFA   PERIPHERAL VASCULAR INTERVENTION Left 05/24/2021   Procedure: PERIPHERAL VASCULAR INTERVENTION;  Surgeon: Margherita Shell, MD;  Location: [facility];  Service: Cardiovascular;  Laterality: Left;   PERIPHERAL VASCULAR INTERVENTION Left 07/28/2022   Procedure: PERIPHERAL VASCULAR INTERVENTION;  Surgeon: Carlene Che, MD;  Location: [facility];  Service: Cardiovascular;  Laterality: Left;  Lt SFA   PPM GENERATOR CHANGEOUT N/A 05/25/2021   Procedure: PPM GENERATOR CHANGEOUT;  Surgeon: Lei Pump, MD;  Location: [facility];  Service: Cardiovascular;  Laterality: N/A;    Family History  Problem Relation Age of Onset   Heart disease Brother    Leukemia  Brother    Social History   Socioeconomic History   Marital status: Legally Separated    Spouse name: Not on file  Number of children: 2   Years of education: 11   Highest education level: Not on file  Occupational History   Occupation: unemployed    Comment: works for himself   Occupation: RETIRED FROM [employer]  Tobacco Use   Smoking status: Every Day    Current packs/day: 0.50    Average packs/day: 0.5 packs/day for 25.0 years (12.5 ttl pk-yrs)    Types: Cigarettes    Passive exposure: Never   Smokeless tobacco: Never  Vaping Use   Vaping status: Never Used  Substance and Sexual Activity   Alcohol use: Not Currently    Alcohol/week: 0.0 standard drinks of alcohol   Drug use: No   Sexual activity: Not Currently  Other Topics Concern   Not on file  Social History Narrative   PT HAS VERY VAGUE FAMILY HISTORY - HE WAS RAISED IN [facility]   Social Drivers of Health   Financial Resource Strain: Low Risk  (07/03/2023)   Overall Financial Resource Strain (CARDIA)    Difficulty of Paying Living Expenses: Not hard at all  Food Insecurity: No Food Insecurity (07/03/2023)   Hunger Vital Sign    Worried About Running Out of Food in the Last Year: Never true    Ran Out of Food in the Last Year: Never true  Transportation Needs: No Transportation Needs (07/03/2023)   PRAPARE - Administrator, Civil Service (Medical): No    Lack of Transportation (Non-Medical): No  Physical Activity: Insufficiently Active (07/03/2023)   Exercise Vital Sign    Days of Exercise per Week: 3 days    Minutes of Exercise per Session: 30 min  Stress: No Stress Concern Present (07/03/2023)   [employer] of Occupational Health - Occupational Stress Questionnaire    Feeling of Stress : Not at all  Social Connections: Socially Isolated (07/03/2023)   Social Connection and Isolation Panel [NHANES]    Frequency of Communication with Friends and Family: More than three times a week     Frequency of Social Gatherings with Friends and Family: Twice a week    Attends Religious Services: Never    Diplomatic Services operational officer: No    Attends Engineer, structural: Never    Marital Status: Separated    Objective:  BP (!) 150/72   Pulse 90   Temp (!) 97.5 F (36.4 C)   Ht 5\' 10"  (1.778 m)   Wt 151 lb 12.8 oz (68.9 kg)   SpO2 98%   BMI 21.78 kg/m      10/08/2023    2:03 PM 10/08/2023    1:17 PM 07/17/2023    9:20 AM  BP/Weight  Systolic BP 150 168 178  Diastolic BP 72 68 84  Wt. (Lbs)  151.8 153  BMI  21.78 kg/m2 21.95 kg/m2    Physical Exam Vitals and nursing note reviewed.  Constitutional:      Appearance: Normal appearance.  HENT:     Head: Normocephalic and atraumatic.  Cardiovascular:     Rate and Rhythm: Normal rate and regular rhythm.  Pulmonary:     Breath sounds: Normal breath sounds.  Musculoskeletal:     Comments: EXTREMITIES: No cyanosis. Weak pulse in right foot, delayed capillary refill, weaker dorsal pedal pulse, onychomycosis of all toenails with overgrowth, athlete's foot on plantar surface, skin peeling, trace swelling on right leg and left lower extremity, skin discoloration, left fourth toe is smaller than others NEUROLOGICAL: Cranial nerves grossly intact, moves all extremities  without gross motor deficit, neuropathy in feet, more pronounced on the left.   Neurological:     Mental Status: He is alert.     Diabetic Foot Exam - Simple   Simple Foot Form Visual Inspection See comments: Yes Sensation Testing See comments: Yes Pulse Check See comments: Yes Comments EXTREMITIES: No cyanosis. Weak pulse in right foot, delayed capillary refill, weaker dorsal pedal pulse, onychomycosis of all toenails with overgrowth, athlete's foot on plantar surface, skin peeling, trace swelling on right leg and left lower extremity, skin discoloration, left fourth toe is smaller than others NEUROLOGICAL: Cranial nerves grossly intact,  moves all extremities without gross motor deficit, neuropathy in feet, more pronounced on the left.      Lab Results  Component Value Date   WBC 12.0 (H) 07/03/2023   HGB 14.3 07/03/2023   HCT 42.8 07/03/2023   PLT 302 07/03/2023   GLUCOSE 103 (H) 07/03/2023   CHOL 129 07/03/2023   TRIG 233 (H) 07/03/2023   HDL 29 (L) 07/03/2023   LDLDIRECT 113.0 12/20/2017   LDLCALC 62 07/03/2023   ALT 17 07/03/2023   AST 19 07/03/2023   NA 142 07/03/2023   K 5.0 07/03/2023   CL 101 07/03/2023   CREATININE 1.00 07/03/2023   BUN 12 07/03/2023   CO2 23 07/03/2023   TSH 1.890 06/21/2022   PSA 0.63 12/20/2017   HGBA1C 8.4 (H) 07/03/2023   MICROALBUR 2.9 (H) 02/07/2017      Assessment & Plan:  Essential hypertension Assessment & Plan: BP elevated on 2 different readings. Currently on DIOVAN  HCT 160-12.5 mg daily. Heavy smoker.  Recommend to monitor home BP measures and Report back if any worsening symptoms or call 911 for any severe symptoms or for medication adjustments.  Orders: -     Comprehensive metabolic panel with GFR -     CBC with Differential/Platelet  Other specified diabetes mellitus with diabetic neuropathy, unspecified whether long term insulin use (HCC) Assessment & Plan: Type 2 Diabetes Mellitus UNCONTROLLED WITH COMPLICATIONS OF  Neuropathy WITHOUT LONG TERM USE OF INSULIN.  Poorly controlled with an A1c of 8.4. Experiences hypoglycemia with symptoms of sweating and shaking, resolving with sugary foods. Neuropathy more pronounced in the left foot with decreased sensation. Discussed continuous glucose monitor for better tracking of blood glucose levels and hypoglycemia/hyperglycemia patterns. Insurance coverage for the monitor will be verified; cost is approximately $60-$70 per month if not covered. - Order continuous glucose monitor. - Perform blood work to reassess A1c levels. - Encourage mindful eating to manage blood glucose levels.  Onychomycosis Overgrown and  disfigured toenails. Toenails require trimming and management of fungal infection. Referral to a podiatrist discussed for toenail trimming and management of onychomycosis. - Refer to podiatrist for toenail trimming and management of onychomycosis.  Orders: -     Comprehensive metabolic panel with GFR -     Hemoglobin A1c  Mixed hyperlipidemia Assessment & Plan: Low HDL and acceptable LDL levels. Currently on atorvastatin  20 mg daily. Discussed the importance of maintaining cholesterol levels to prevent further vascular issues. - Order lipid panel - Continue atorvastatin  20 mg daily - Consider increasing dose if cholesterol levels are not controlled  Orders: -     Comprehensive metabolic panel with GFR -     Lipid panel  Iron  deficiency anemia due to chronic blood loss -     Iron , TIBC and Ferritin Panel  Uncontrolled type 2 diabetes mellitus with hyperglycemia, without long-term current use of insulin (HCC) Assessment &  Plan: As above  Orders: -     Ambulatory referral to Podiatry  Presence of heart assist device [facility]) Assessment & Plan: Pacemaker present, placed for complete AV block   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 No significant improvement in left foot sensation. Examination shows weak pulse in the right foot, delayed capillary refill, and trace swelling in both lower extremities. Left fourth toe partially amputated with no blood flow previously noted in the big toe.   Recommend to continue current medication regimen with ASPIRIN  81 MG, PLAVIX  75 MG, LIPITOR 40 MG AND recommend smoking cessation.   Discussed referral to a podiatrist for further evaluation and management, including assessment for diabetic shoes. - Refer to podiatrist for further evaluation and management, including assessment for diabetic shoes.   COPD (chronic obstructive  pulmonary disease) with chronic bronchitis (HCC) Assessment & Plan: Well controlled  Albuterol  inhaler as needed  Strongly stressed on quitting smoking or even cutting back further  Tobacco Use Current smoker, approximately 15 cigarettes per day. Advised on financial and health benefits of smoking cessation, including potential cost savings for health management tools like the glucose monitor. - Encourage reduction in smoking with the goal of cessation.     Other orders -     Continuous Glucose Monitor Sup; To use for continuous glucose monitor  Dispense: 1 each; Refill: 0    Assessment and Plan           Meds ordered this encounter  Medications   Continuous Glucose Monitor Sup MISC    Sig: To use for continuous glucose monitor    Dispense:  1 each    Refill:  0    Dexcom or Freestyle Libre, whichever is covered by insurance, with sensor.  Does not need receiver. UNCONTROLLED diabetes with HYPOGLYCEMIA and HYPERGLYCEMIA.    Orders Placed This Encounter  Procedures   Comprehensive metabolic panel with GFR   CBC with Differential   Lipid Panel   Hemoglobin A1c   Iron , TIBC and Ferritin Panel   Ambulatory referral to Podiatry     Follow-up: Return in about 3 months (around 01/08/2024) for chronic disease follow up.   Total time spent on today's visit was 46 minutes, including both face-to-face time and nonface-to-face time personally spent on review of chart (labs and imaging), discussing labs and goals, discussing further work-up, treatment options, referrals to specialist if needed, reviewing outside records of pertinent, answering patient's questions, and coordinating care.  An After Visit Summary was printed and given to the patient.  Ilean Spradlin, MD [facility] [phone]

## 2023-10-08 NOTE — Assessment & Plan Note (Addendum)
 Type 2 Diabetes Mellitus UNCONTROLLED WITH COMPLICATIONS OF  Neuropathy WITHOUT LONG TERM USE OF INSULIN.  Poorly controlled with an A1c of 8.4. Experiences hypoglycemia with symptoms of sweating and shaking, resolving with sugary foods. Neuropathy more pronounced in the left foot with decreased sensation. Discussed continuous glucose monitor for better tracking of blood glucose levels and hypoglycemia/hyperglycemia patterns. Insurance coverage for the monitor will be verified; cost is approximately $60-$70 per month if not covered. - Order continuous glucose monitor. - Perform blood work to reassess A1c levels. - Encourage mindful eating to manage blood glucose levels.  Onychomycosis Overgrown and disfigured toenails. Toenails require trimming and management of fungal infection. Referral to a podiatrist discussed for toenail trimming and management of onychomycosis. - Refer to podiatrist for toenail trimming and management of onychomycosis.

## 2023-10-08 NOTE — Assessment & Plan Note (Signed)
 Low HDL and acceptable LDL levels. Currently on atorvastatin 20 mg daily. Discussed the importance of maintaining cholesterol levels to prevent further vascular issues. - Order lipid panel - Continue atorvastatin 20 mg daily - Consider increasing dose if cholesterol levels are not controlled

## 2023-10-08 NOTE — Assessment & Plan Note (Signed)
 Peripheral vascular disease with several previous surgical intervention. No significant improvement in left foot sensation. Examination shows weak pulse in the right foot, delayed capillary refill, and trace swelling in both lower extremities. Left fourth toe partially amputated with no blood flow previously noted in the big toe.   Recommend to continue current medication regimen with ASPIRIN  81 MG, PLAVIX  75 MG, LIPITOR 40 MG AND recommend smoking cessation.   Discussed referral to a podiatrist for further evaluation and management, including assessment for diabetic shoes. - Refer to podiatrist for further evaluation and management, including assessment for diabetic shoes.

## 2023-10-08 NOTE — Assessment & Plan Note (Signed)
 Pacemaker present, placed for complete AV block

## 2023-10-08 NOTE — Patient Instructions (Signed)
  VISIT SUMMARY: You came in for a follow-up on your leg circulation and blood sugar management. We discussed your diabetes, peripheral neuropathy, and smoking habits. We also talked about your need for diabetic shoes and toenail care.  YOUR PLAN: TYPE 2 DIABETES MELLITUS WITH NEUROPATHY: Your blood sugar levels have been high, and you have experienced episodes of low blood sugar. Your foot neuropathy is more pronounced in the left foot. -We will order a continuous glucose monitor to help you track your blood sugar levels more effectively. -We will perform blood work to reassess your A1c levels. -Please practice mindful eating to help manage your blood sugar levels.  PERIPHERAL VASCULAR DISEASE: You have poor circulation in your legs, with weak pulse and delayed capillary refill in your feet. Your left fourth toe is partially amputated. -We will refer you to a podiatrist for further evaluation and management, including assessment for diabetic shoes.  ONYCHOMYCOSIS: You have overgrown and disfigured toenails that need trimming and management of a fungal infection. -We will refer you to a podiatrist for toenail trimming and management of onychomycosis.  TOBACCO USE: You are currently smoking about 15 cigarettes a day. -We encourage you to reduce your smoking with the goal of quitting altogether. This will have financial and health benefits, including potential cost savings for health management tools like the glucose monitor.  HEARING LOSS: You have significant hearing loss that is impacting communication. -We will monitor your hearing loss and discuss further steps if needed.    Contains text generated by [employer].     Contains text generated by [employer]bridge.

## 2023-10-09 ENCOUNTER — Ambulatory Visit: Payer: Self-pay

## 2023-10-09 LAB — CBC WITH DIFFERENTIAL/PLATELET
Basophils Absolute: 0 10*3/uL (ref 0.0–0.2)
Basos: 0 %
EOS (ABSOLUTE): 0.1 10*3/uL (ref 0.0–0.4)
Eos: 1 %
Hematocrit: 41.6 % (ref 37.5–51.0)
Hemoglobin: 13.6 g/dL (ref 13.0–17.7)
Immature Grans (Abs): 0.1 10*3/uL (ref 0.0–0.1)
Immature Granulocytes: 1 %
Lymphocytes Absolute: 2.4 10*3/uL (ref 0.7–3.1)
Lymphs: 26 %
MCH: 31.9 pg (ref 26.6–33.0)
MCHC: 32.7 g/dL (ref 31.5–35.7)
MCV: 98 fL — ABNORMAL HIGH (ref 79–97)
Monocytes Absolute: 0.7 10*3/uL (ref 0.1–0.9)
Monocytes: 8 %
Neutrophils Absolute: 5.8 10*3/uL (ref 1.4–7.0)
Neutrophils: 64 %
Platelets: 296 10*3/uL (ref 150–450)
RBC: 4.26 x10E6/uL (ref 4.14–5.80)
RDW: 13.5 % (ref 11.6–15.4)
WBC: 9 10*3/uL (ref 3.4–10.8)

## 2023-10-09 LAB — HEMOGLOBIN A1C
Est. average glucose Bld gHb Est-mCnc: 146 mg/dL
Hgb A1c MFr Bld: 6.7 % — ABNORMAL HIGH (ref 4.8–5.6)

## 2023-10-09 LAB — LIPID PANEL
Chol/HDL Ratio: 3.7 ratio (ref 0.0–5.0)
Cholesterol, Total: 100 mg/dL (ref 100–199)
HDL: 27 mg/dL — ABNORMAL LOW (ref >39)
LDL Chol Calc (NIH): 46 mg/dL (ref 0–99)
Triglycerides: 160 mg/dL — ABNORMAL HIGH (ref 0–149)
VLDL Cholesterol Cal: 27 mg/dL (ref 5–40)

## 2023-10-09 LAB — COMPREHENSIVE METABOLIC PANEL WITH GFR
ALT: 16 IU/L (ref 0–44)
AST: 17 IU/L (ref 0–40)
Albumin: 4.3 g/dL (ref 3.9–4.9)
Alkaline Phosphatase: 118 IU/L (ref 44–121)
BUN/Creatinine Ratio: 10 (ref 10–24)
BUN: 10 mg/dL (ref 8–27)
Bilirubin Total: 0.2 mg/dL (ref 0.0–1.2)
CO2: 22 mmol/L (ref 20–29)
Calcium: 9.7 mg/dL (ref 8.6–10.2)
Chloride: 102 mmol/L (ref 96–106)
Creatinine, Ser: 1.04 mg/dL (ref 0.76–1.27)
Globulin, Total: 2.3 g/dL (ref 1.5–4.5)
Glucose: 66 mg/dL — ABNORMAL LOW (ref 70–99)
Potassium: 4.5 mmol/L (ref 3.5–5.2)
Sodium: 139 mmol/L (ref 134–144)
Total Protein: 6.6 g/dL (ref 6.0–8.5)
eGFR: 78 mL/min/{1.73_m2} (ref >59)

## 2023-10-09 LAB — IRON,TIBC AND FERRITIN PANEL
Ferritin: 49 ng/mL (ref 30–400)
Iron Saturation: 38 % (ref 15–55)
Iron: 131 ug/dL (ref 38–169)
Total Iron Binding Capacity: 347 ug/dL (ref 250–450)
UIBC: 216 ug/dL (ref 111–343)

## 2023-10-25 ENCOUNTER — Other Ambulatory Visit (HOSPITAL_BASED_OUTPATIENT_CLINIC_OR_DEPARTMENT_OTHER): Payer: Self-pay

## 2023-10-25 DIAGNOSIS — M47816 Spondylosis without myelopathy or radiculopathy, lumbar region: Secondary | ICD-10-CM | POA: Diagnosis not present

## 2023-10-25 MED ORDER — OXYCODONE-ACETAMINOPHEN 10-325 MG PO TABS
1.0000 | ORAL_TABLET | Freq: Three times a day (TID) | ORAL | 0 refills | Status: DC | PRN
  Filled 2023-11-02: qty 90, 30d supply, fill #0

## 2023-11-01 ENCOUNTER — Ambulatory Visit (INDEPENDENT_AMBULATORY_CARE_PROVIDER_SITE_OTHER): Admitting: Podiatry

## 2023-11-01 DIAGNOSIS — M79674 Pain in right toe(s): Secondary | ICD-10-CM | POA: Diagnosis not present

## 2023-11-01 DIAGNOSIS — M79675 Pain in left toe(s): Secondary | ICD-10-CM

## 2023-11-01 DIAGNOSIS — E1142 Type 2 diabetes mellitus with diabetic polyneuropathy: Secondary | ICD-10-CM | POA: Diagnosis not present

## 2023-11-01 DIAGNOSIS — B351 Tinea unguium: Secondary | ICD-10-CM

## 2023-11-01 DIAGNOSIS — B353 Tinea pedis: Secondary | ICD-10-CM | POA: Diagnosis not present

## 2023-11-01 NOTE — Progress Notes (Signed)
 Subjective:  Patient ID: FLEET HIGHAM, male    DOB: [birth year],  MRN: [id number]  Andrew Wiggins presents to clinic today for:  Chief Complaint  Patient presents with   Diabetes    DFC A1C - 5.8 BLOOD THINNER   Patient notes nails are thick, discolored, elongated and painful in shoegear when trying to ambulate.  He notes his diabetic peripheral neuropathy has been affecting his balance.  He is also interested in diabetic shoes.  He has a very short left fourth toe.  He has some itching between the 3rd, 4th and 5th toes on the right foot.  PCP is Sirivol, Mamatha, MD.  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 ~  Jan11:  Last seen by Cards & pacer reprogrammed...     Cardiac pacemaker in situ    Carotid artery disease (HCC) 07/27/2010   R/O PERIPHERAL VASCULAR DISEASE (ICD-443.9) - on ASA 81mg /d... exam 11/09 shows gr 1-2 sys murmur LSB, but also has prominent bruit to base of right side of his neck w/ right > left Carotid Bruit... ~  CDopplers 11/09 showed severe plaque right ICA, & min plaque on left... no signif ICA stenoses per report. 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 smokes 1/2 - 1ppd x yrs... ~  PFT's 11/08 showed FVC= 3.85 (79%), FEV1= 2.19 (55%), %1sec= 57, mid-flows= 32% predicted:  all c/w mod airflow obstruction...  ~  CXR 12/10 showed left subclav pacer, mild hyperinflation, NAD...      Depression    Dermatitis, contact    due to poison ivy   Diabetes mellitus (HCC) 02/04/2020   Diabetes mellitus with neuropathy (HCC) 12/20/2016   Dyslipidemia 03/06/2012   Encounter for prostate cancer screening 06/21/2022   Essential hypertension 04/10/2007    HYPERTENSION (ICD-401.9) - prev on Micardis 80mg /d then switched to Losartan 100mg /d, but he lost his job in 2011& couldn't afford Rx;  He called us  2/12 w/ this news & we phoned in Lisinopril  20mg /d... ~  2DEcho 11/09 showed sl incr LVwall thickness, norm LVF w/ EF= 55-60%, no regional wall motion abn, mild DD, mild MR... ~  Myoview  12/09 showed LBBB, no perfusion defects, abn septal motion & EF   GERD 03/26/2008    GERD (ICD-530.81) - prev mild GERD symptoms that improved after cholecystectomy on 2000... uses OTC PPI as needed... ~  he is due for routine screening colonoscopy...       Iron  deficiency anemia due to chronic blood loss 06/21/2022   Mixed hyperlipidemia 06/19/2019   PVD (peripheral vascular disease) (HCC)    Sciatica 11/18/2019   Tobacco use 02/04/2020   Tobacco use disorder    Past Surgical History:  Procedure Laterality Date   ABDOMINAL AORTOGRAM W/LOWER EXTREMITY N/A 12/14/2020   Procedure: ABDOMINAL AORTOGRAM W/LOWER EXTREMITY;  Surgeon: Serene Gaile ORN, MD;  Location: [facility];  Service: Cardiovascular;  Laterality: N/A;   ABDOMINAL AORTOGRAM W/LOWER EXTREMITY N/A 05/24/2021   Procedure: ABDOMINAL AORTOGRAM W/LOWER EXTREMITY;  Surgeon: Serene Gaile ORN, MD;  Location: [facility];  Service: Cardiovascular;  Laterality: N/A;   ABDOMINAL AORTOGRAM W/LOWER EXTREMITY N/A 07/28/2022   Procedure: ABDOMINAL AORTOGRAM W/LOWER EXTREMITY;  Surgeon: Magda Debby SAILOR, MD;  Location: [facility];  Service: Cardiovascular;  Laterality: N/A;   ABDOMINAL AORTOGRAM W/LOWER EXTREMITY N/A 06/08/2023   Procedure: ABDOMINAL AORTOGRAM W/LOWER EXTREMITY;  Surgeon: Magda Debby SAILOR, MD;  Location: [facility];  Service: Cardiovascular;  Laterality: N/A;   CATARACT EXTRACTION Bilateral    laproscopic cholecystectomy  05/08/1998   by Dr. Cephus   MOUTH SURGERY     PACEMAKER INSERTION  03/08/2008   by Dr. Waddell   PERIPHERAL VASCULAR BALLOON ANGIOPLASTY Left 06/08/2023   Procedure: PERIPHERAL VASCULAR BALLOON ANGIOPLASTY;  Surgeon: Magda Debby SAILOR, MD;  Location: [facility] CV [facility];  Service: Cardiovascular;  Laterality: Left;  SFA   PERIPHERAL VASCULAR INTERVENTION Left 12/14/2020   Procedure: PERIPHERAL VASCULAR INTERVENTION;  Surgeon: Serene Gaile ORN, MD;  Location: [facility];  Service: Cardiovascular;  Laterality: Left;  SFA   PERIPHERAL VASCULAR INTERVENTION Left 05/24/2021   Procedure: PERIPHERAL VASCULAR INTERVENTION;  Surgeon: Serene Gaile ORN, MD;  Location: [facility];  Service: Cardiovascular;  Laterality: Left;   PERIPHERAL VASCULAR INTERVENTION Left 07/28/2022   Procedure: PERIPHERAL VASCULAR INTERVENTION;  Surgeon: Magda Debby SAILOR, MD;  Location: [facility];  Service: Cardiovascular;  Laterality: Left;  Lt SFA   PPM GENERATOR CHANGEOUT N/A 05/25/2021   Procedure: PPM GENERATOR CHANGEOUT;  Surgeon: Inocencio Soyla Lunger, MD;  Location: [facility];  Service: Cardiovascular;  Laterality: N/A;   No Known Allergies  Review of Systems: Negative except as noted in the HPI.  Objective:  There were no vitals filed for this visit.  Andrew Wiggins is a pleasant 68 y.o. male in NAD. AAO x 3.  Vascular Examination: Capillary refill time is 3-5 seconds to toes bilateral. Palpable pedal pulses b/l LE. Digital hair present b/l.  Skin temperature gradient WNL b/l. No varicosities b/l. No cyanosis noted b/l.   Dermatological Examination: Pedal skin with normal turgor, texture and tone b/l. No open wounds. No interdigital macerations b/l. Toenails x10 are 3mm thick, discolored, dystrophic with subungual debris. There is pain with compression of the nail  plates.  They are elongated x10  Neurological examination: There is absent sensation with the Semmes Weinstein monofilament to all toes on the left foot.  Vibration and temperature sensation are intact to the midfoot and rear foot.  Light touch sensation is diminished to the forefoot.     Latest Ref Rng & Units 10/08/2023    1:59 PM 07/03/2023    8:54 AM  Hemoglobin A1C  Hemoglobin-A1c 4.8 - 5.6 % 6.7  8.4    Assessment/Plan: 1. Pain due to onychomycosis of toenails of both feet   2. Diabetic polyneuropathy associated with type 2 diabetes mellitus (HCC)   3. Tinea pedis of right foot    Discussed options for treating his peripheral neuropathy.  He may qualify for Qutenza treatments in the office.  I informed patient that I am not sure what his health insurance coverage is regarding this treatment.  I did send a secure message to our clinic supervisor who is currently taking on the role of prior Auth's for Qutenza.  Patient would like a call back if we are able to proceed with this treatment for him.  The mycotic toenails were sharply debrided x10 with sterile nail nippers and a power debriding burr to decrease bulk/thickness and length.    Castellani's paint was applied in the 3rd and 4th interspaces on the right foot today.  If he continues to have clinical signs of interdigital tinea pedis, he will call the office so that we can send in prescription antifungal therapy.  Patient does qualify for diabetic shoes.  Will need to do an order for [facility] for the diabetic shoes and inserts and faxed to their office along with today's records.  Return in about 3 months (around 02/01/2024) for [facility].   Awanda CHARM Imperial, DPM, FACFAS [facility]     [address] [place], [place] [address]                Office [phone]  Fax [phone]

## 2023-11-02 ENCOUNTER — Other Ambulatory Visit (HOSPITAL_BASED_OUTPATIENT_CLINIC_OR_DEPARTMENT_OTHER): Payer: Self-pay

## 2023-11-12 DIAGNOSIS — E113293 Type 2 diabetes mellitus with mild nonproliferative diabetic retinopathy without macular edema, bilateral: Secondary | ICD-10-CM | POA: Diagnosis not present

## 2023-11-12 LAB — HM DIABETES EYE EXAM

## 2023-11-13 ENCOUNTER — Ambulatory Visit: Payer: Self-pay

## 2023-11-14 ENCOUNTER — Other Ambulatory Visit: Payer: Self-pay

## 2023-11-14 DIAGNOSIS — D500 Iron deficiency anemia secondary to blood loss (chronic): Secondary | ICD-10-CM

## 2023-11-14 DIAGNOSIS — E0840 Diabetes mellitus due to underlying condition with diabetic neuropathy, unspecified: Secondary | ICD-10-CM

## 2023-11-23 ENCOUNTER — Ambulatory Visit: Payer: PPO

## 2023-11-23 DIAGNOSIS — I4430 Unspecified atrioventricular block: Secondary | ICD-10-CM

## 2023-11-27 ENCOUNTER — Telehealth: Payer: Self-pay | Admitting: Cardiology

## 2023-11-27 LAB — CUP PACEART REMOTE DEVICE CHECK
Battery Remaining Longevity: 129 mo
Battery Voltage: 3.02 V
Brady Statistic AP VP Percent: 1.83 %
Brady Statistic AP VS Percent: 0 %
Brady Statistic AS VP Percent: 98.06 %
Brady Statistic AS VS Percent: 0.1 %
Brady Statistic RA Percent Paced: 1.85 %
Brady Statistic RV Percent Paced: 99.9 %
Date Time Interrogation Session: 20250718015814
Implantable Lead Connection Status: 753985
Implantable Lead Connection Status: 753985
Implantable Lead Implant Date: 20091120
Implantable Lead Implant Date: 20091120
Implantable Lead Location: 753859
Implantable Lead Location: 753860
Implantable Lead Model: 5076
Implantable Lead Model: 5076
Implantable Pulse Generator Implant Date: 20230118
Lead Channel Impedance Value: 323 Ohm
Lead Channel Impedance Value: 380 Ohm
Lead Channel Impedance Value: 741 Ohm
Lead Channel Impedance Value: 817 Ohm
Lead Channel Pacing Threshold Amplitude: 0.75 V
Lead Channel Pacing Threshold Amplitude: 0.75 V
Lead Channel Pacing Threshold Pulse Width: 0.4 ms
Lead Channel Pacing Threshold Pulse Width: 0.4 ms
Lead Channel Sensing Intrinsic Amplitude: 2.875 mV
Lead Channel Sensing Intrinsic Amplitude: 2.875 mV
Lead Channel Sensing Intrinsic Amplitude: 6.75 mV
Lead Channel Setting Pacing Amplitude: 1.5 V
Lead Channel Setting Pacing Amplitude: 2 V
Lead Channel Setting Pacing Pulse Width: 0.4 ms
Lead Channel Setting Sensing Sensitivity: 0.9 mV
Zone Setting Status: 755011
Zone Setting Status: 755011

## 2023-11-27 NOTE — Telephone Encounter (Signed)
 LVM for pt to call and schedule overdue follow up with Camnitz/kbl 11/27/23

## 2023-11-28 ENCOUNTER — Ambulatory Visit: Payer: Self-pay | Admitting: Internal Medicine

## 2023-11-28 ENCOUNTER — Other Ambulatory Visit (HOSPITAL_BASED_OUTPATIENT_CLINIC_OR_DEPARTMENT_OTHER): Payer: Self-pay

## 2023-11-28 DIAGNOSIS — Z79891 Long term (current) use of opiate analgesic: Secondary | ICD-10-CM | POA: Diagnosis not present

## 2023-11-28 DIAGNOSIS — M47816 Spondylosis without myelopathy or radiculopathy, lumbar region: Secondary | ICD-10-CM | POA: Diagnosis not present

## 2023-11-28 MED ORDER — OXYCODONE-ACETAMINOPHEN 10-325 MG PO TABS
1.0000 | ORAL_TABLET | Freq: Three times a day (TID) | ORAL | 0 refills | Status: DC | PRN
  Filled 2023-11-30: qty 90, 30d supply, fill #0

## 2023-11-29 NOTE — Telephone Encounter (Signed)
 LVM for pt to call and schedule overdue follow up with Camnitz/kbl 11/29/23

## 2023-11-30 ENCOUNTER — Other Ambulatory Visit (HOSPITAL_BASED_OUTPATIENT_CLINIC_OR_DEPARTMENT_OTHER): Payer: Self-pay

## 2023-12-06 NOTE — Telephone Encounter (Signed)
 LVM for pt to call and schedule overdue follow up with Camnitz/kbl 12/06/23

## 2023-12-26 ENCOUNTER — Other Ambulatory Visit (HOSPITAL_BASED_OUTPATIENT_CLINIC_OR_DEPARTMENT_OTHER): Payer: Self-pay

## 2023-12-26 MED ORDER — OXYCODONE-ACETAMINOPHEN 10-325 MG PO TABS
1.0000 | ORAL_TABLET | Freq: Three times a day (TID) | ORAL | 0 refills | Status: DC | PRN
  Filled 2023-12-28: qty 90, 30d supply, fill #0

## 2023-12-28 ENCOUNTER — Other Ambulatory Visit (HOSPITAL_BASED_OUTPATIENT_CLINIC_OR_DEPARTMENT_OTHER): Payer: Self-pay

## 2024-01-02 NOTE — Progress Notes (Signed)
 [facility]  [address] [place],  [place]  [address] [phone]  Clinic Day:  07/05/2023  Referring physician: Sirivol, Mamatha, MD   HISTORY OF PRESENT ILLNESS:  The patient is a 68 y.o. male with iron  deficiency anemia.  He comes in today to reassess his iron  and hemoglobin levels after recently receiving IV iron  in November 2024.  Of note, the patient has felt better since his IV iron  was given.  He continues to deny having any overt forms of blood loss to explain his past anemia.  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 Wt Readings from Last 3 Encounters:  10/08/23 151 lb 12.8 oz (68.9 kg)  07/17/23 153 lb (69.4 kg)  07/05/23 152 lb 6.4 oz (69.1 kg)   There is no height or weight on file to calculate BMI. Performance status (ECOG): 0 - Asymptomatic Physical Exam Constitutional:      Appearance: Normal appearance. He is not ill-appearing.  HENT:     Mouth/Throat:     Mouth: Mucous membranes are moist.     Pharynx: Oropharynx is clear. No oropharyngeal exudate or posterior oropharyngeal erythema.  Cardiovascular:     Rate and Rhythm: Normal rate and regular rhythm.     Heart sounds: No murmur heard.    No friction rub. No gallop.  Pulmonary:     Effort: Pulmonary effort is normal. No respiratory distress.     Breath sounds: Normal breath sounds. No wheezing, rhonchi or rales.  Abdominal:     General: Bowel sounds are normal. There is no distension.     Palpations: Abdomen is soft. There is no mass.     Tenderness: There is no abdominal tenderness.  Musculoskeletal:        General: No swelling.     Right lower leg: No edema.     Left lower leg: No edema.  Lymphadenopathy:     Cervical: No cervical adenopathy.     Upper Body:     Right upper body: No supraclavicular or axillary adenopathy.     Left upper body: No supraclavicular or  axillary adenopathy.     Lower Body: No right inguinal adenopathy. No left inguinal adenopathy.  Skin:    General: Skin is warm.     Coloration: Skin is not jaundiced.     Findings: No lesion or rash.  Neurological:     General: No focal deficit present.     Mental Status: He is alert and oriented to person, place, and time. Mental status is at baseline.  Psychiatric:        Mood and Affect: Mood normal.        Behavior: Behavior normal.        Thought Content: Thought content normal.    LABS:    Latest Reference Range & Units 07/03/23 08:54  WBC 3.4 - 10.8 x10E3/uL 12.0 (H)  RBC 4.14 - 5.80 x10E6/uL 4.42  Hemoglobin 13.0 - 17.7 g/dL 85.6  HCT 62.4 - 48.9 % 42.8  MCV 79 - 97 fL 97  MCH 26.6 - 33.0 pg 32.4  MCHC 31.5 - 35.7 g/dL 66.5  RDW 88.3 - 84.5 % 12.9  Platelets 150 - 450 x10E3/uL 302  (H): Data is abnormally high   Latest Reference Range & Units 07/03/23 08:54  Iron  38 - 169 ug/dL 756 (H)  UIBC 888 - 656  ug/dL 879  TIBC 749 - 549 ug/dL 636  Ferritin 30 - 599 ng/mL 85  Iron  Saturation 15 - 55 % 67 (H)  (H): Data is abnormally high  ASSESSMENT & PLAN:  A 68 y.o. male with iron  deficiency anemia.  His hemoglobin and iron  levels have clearly improved since receiving his IV iron . Clinically, he is doing much better.  I will see this patient back in 6 months for repeat clinical assessment.  The patient understands all the plans discussed today and is in agreement with them.   Dayln Tugwell DELENA Kerns, MD

## 2024-01-03 ENCOUNTER — Inpatient Hospital Stay

## 2024-01-03 ENCOUNTER — Encounter: Payer: Self-pay | Admitting: Oncology

## 2024-01-03 ENCOUNTER — Inpatient Hospital Stay: Attending: Oncology | Admitting: Oncology

## 2024-01-03 ENCOUNTER — Telehealth: Payer: Self-pay | Admitting: Oncology

## 2024-01-03 ENCOUNTER — Other Ambulatory Visit: Payer: Self-pay

## 2024-01-03 VITALS — BP 177/72 | HR 108 | Temp 98.8°F | Resp 20 | Ht 70.0 in | Wt 147.9 lb

## 2024-01-03 DIAGNOSIS — D509 Iron deficiency anemia, unspecified: Secondary | ICD-10-CM | POA: Insufficient documentation

## 2024-01-03 DIAGNOSIS — D500 Iron deficiency anemia secondary to blood loss (chronic): Secondary | ICD-10-CM

## 2024-01-03 LAB — CBC WITH DIFFERENTIAL (CANCER CENTER ONLY)
Abs Immature Granulocytes: 0.05 K/uL (ref 0.00–0.07)
Basophils Absolute: 0 K/uL (ref 0.0–0.1)
Basophils Relative: 0 %
Eosinophils Absolute: 0.1 K/uL (ref 0.0–0.5)
Eosinophils Relative: 1 %
HCT: 43.2 % (ref 39.0–52.0)
Hemoglobin: 14.8 g/dL (ref 13.0–17.0)
Immature Granulocytes: 1 %
Lymphocytes Relative: 22 %
Lymphs Abs: 2.5 K/uL (ref 0.7–4.0)
MCH: 32.4 pg (ref 26.0–34.0)
MCHC: 34.3 g/dL (ref 30.0–36.0)
MCV: 94.5 fL (ref 80.0–100.0)
Monocytes Absolute: 0.9 K/uL (ref 0.1–1.0)
Monocytes Relative: 8 %
Neutro Abs: 7.5 K/uL (ref 1.7–7.7)
Neutrophils Relative %: 68 %
Platelet Count: 281 K/uL (ref 150–400)
RBC: 4.57 MIL/uL (ref 4.22–5.81)
RDW: 13.9 % (ref 11.5–15.5)
WBC Count: 11 K/uL — ABNORMAL HIGH (ref 4.0–10.5)
nRBC: 0 % (ref 0.0–0.2)

## 2024-01-03 LAB — IRON AND TIBC
Iron: 145 ug/dL (ref 45–182)
Saturation Ratios: 34 % (ref 17.9–39.5)
TIBC: 428 ug/dL (ref 250–450)
UIBC: 283 ug/dL

## 2024-01-03 LAB — FERRITIN: Ferritin: 26 ng/mL (ref 24–336)

## 2024-01-03 NOTE — Telephone Encounter (Signed)
 Patient has been scheduled for follow-up visit per 01/03/24 LOS.  Pt aware of scheduled appt details.

## 2024-01-08 ENCOUNTER — Other Ambulatory Visit (HOSPITAL_BASED_OUTPATIENT_CLINIC_OR_DEPARTMENT_OTHER): Payer: Self-pay

## 2024-01-08 DIAGNOSIS — M47816 Spondylosis without myelopathy or radiculopathy, lumbar region: Secondary | ICD-10-CM | POA: Diagnosis not present

## 2024-01-08 MED ORDER — OXYCODONE-ACETAMINOPHEN 10-325 MG PO TABS
1.0000 | ORAL_TABLET | Freq: Three times a day (TID) | ORAL | 0 refills | Status: DC | PRN
  Filled 2024-01-28: qty 90, 30d supply, fill #0

## 2024-01-09 ENCOUNTER — Other Ambulatory Visit: Payer: Self-pay | Admitting: Oncology

## 2024-01-09 ENCOUNTER — Encounter: Payer: Self-pay | Admitting: Oncology

## 2024-01-09 DIAGNOSIS — D500 Iron deficiency anemia secondary to blood loss (chronic): Secondary | ICD-10-CM

## 2024-01-09 NOTE — Progress Notes (Signed)
 [facility] [address] [place],  [place]  [address] [phone]  Clinic Day:  01/03/2024  Referring physician: Sirivol, Mamatha, MD   HISTORY OF PRESENT ILLNESS:  The patient is a 68 y.o. male with iron  deficiency anemia.  He was last given IV iron  in November 2024.  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  He comes in today for routine follow-up.  Since his last visit, the patient has been doing well.  He denies having increased fatigue or any overt forms of blood loss which concern him for recurrent iron  deficiency anemia.  PHYSICAL EXAM:  Blood pressure (!) 177/72, pulse (!) 108, temperature 98.8 F (37.1 C), temperature source Oral, resp. rate 20, height 5' 10 (1.778 m), weight 147 lb 14.4 oz (67.1 kg), SpO2 97%. Wt Readings from Last 3 Encounters:  01/03/24 147 lb 14.4 oz (67.1 kg)  10/08/23 151 lb 12.8 oz (68.9 kg)  07/17/23 153 lb (69.4 kg)   Body mass index is 21.22 kg/m. Performance status (ECOG): 1 - Symptomatic but completely ambulatory Physical Exam Constitutional:      Appearance: Normal appearance. He is not ill-appearing.  HENT:     Mouth/Throat:     Mouth: Mucous membranes are moist.     Pharynx: Oropharynx is clear. No oropharyngeal exudate or posterior oropharyngeal erythema.  Cardiovascular:     Rate and Rhythm: Normal rate and regular rhythm.     Heart sounds: No murmur heard.    No friction rub. No gallop.  Pulmonary:     Effort: Pulmonary effort is normal. No respiratory distress.     Breath sounds: Normal breath sounds. No wheezing, rhonchi or rales.  Abdominal:     General: Bowel sounds are normal. There is no distension.     Palpations: Abdomen is soft. There is no mass.     Tenderness: There is no abdominal tenderness.  Musculoskeletal:        General: No swelling.     Right lower leg: No edema.     Left lower leg: No edema.  Lymphadenopathy:     Cervical: No  cervical adenopathy.     Upper Body:     Right upper body: No supraclavicular or axillary adenopathy.     Left upper body: No supraclavicular or axillary adenopathy.     Lower Body: No right inguinal adenopathy. No left inguinal adenopathy.  Skin:    General: Skin is warm.     Coloration: Skin is not jaundiced.     Findings: No lesion or rash.  Neurological:     General: No focal deficit present.     Mental Status: He is alert and oriented to person, place, and time. Mental status is at baseline.  Psychiatric:        Mood and Affect: Mood normal.        Behavior: Behavior normal.        Thought Content: Thought content normal.     LABS:      Latest Ref Rng & Units 01/03/2024    2:49 PM 10/08/2023    1:59 PM 07/03/2023    8:54 AM  CBC  WBC 4.0 - 10.5 K/uL 11.0  9.0  12.0   Hemoglobin 13.0 - 17.0 g/dL 85.1  86.3  85.6   Hematocrit 39.0 - 52.0 % 43.2  41.6  42.8   Platelets 150 - 400 K/uL 281  296  302  Latest Ref Rng & Units 10/08/2023    1:59 PM 07/03/2023    8:54 AM 06/08/2023    6:05 AM  CMP  Glucose 70 - 99 mg/dL 66  896  885   BUN 8 - 27 mg/dL 10  12  15    Creatinine 0.76 - 1.27 mg/dL 8.95  8.99  8.99   Sodium 134 - 144 mmol/L 139  142  140   Potassium 3.5 - 5.2 mmol/L 4.5  5.0  4.2   Chloride 96 - 106 mmol/L 102  101  102   CO2 20 - 29 mmol/L 22  23    Calcium  8.6 - 10.2 mg/dL 9.7  9.9    Total Protein 6.0 - 8.5 g/dL 6.6  7.1    Total Bilirubin 0.0 - 1.2 mg/dL 0.2  0.4    Alkaline Phos 44 - 121 IU/L 118  115    AST 0 - 40 IU/L 17  19    ALT 0 - 44 IU/L 16  17      Latest Reference Range & Units 01/03/24 14:49 01/03/24 17:12  Iron  45 - 182 ug/dL  854  UIBC ug/dL  716  TIBC 749 - 549 ug/dL  571  Saturation Ratios 17.9 - 39.5 %  34  Ferritin 24 - 336 ng/mL 26     ASSESSMENT & PLAN:  Assessment/Plan:  A 68 y.o. male with iron  deficiency anemia.  I am pleased that this gentleman's hemoglobin and iron  levels remain ideal.  Clinically, the patient continues to  do very well.  As that is the case, I will see him back in 6 months for repeat clinical assessment..The patient understands all the plans discussed today and is in agreement with them.    Nyeem Stoke DELENA Kerns, MD

## 2024-01-10 ENCOUNTER — Ambulatory Visit

## 2024-01-10 VITALS — BP 140/60 | HR 96 | Temp 97.7°F | Resp 16 | Ht 70.0 in | Wt 150.0 lb

## 2024-01-10 DIAGNOSIS — L570 Actinic keratosis: Secondary | ICD-10-CM

## 2024-01-10 DIAGNOSIS — E1140 Type 2 diabetes mellitus with diabetic neuropathy, unspecified: Secondary | ICD-10-CM

## 2024-01-10 DIAGNOSIS — E118 Type 2 diabetes mellitus with unspecified complications: Secondary | ICD-10-CM | POA: Diagnosis not present

## 2024-01-10 DIAGNOSIS — J4489 Other specified chronic obstructive pulmonary disease: Secondary | ICD-10-CM

## 2024-01-10 DIAGNOSIS — Z23 Encounter for immunization: Secondary | ICD-10-CM | POA: Diagnosis not present

## 2024-01-10 DIAGNOSIS — F17200 Nicotine dependence, unspecified, uncomplicated: Secondary | ICD-10-CM | POA: Diagnosis not present

## 2024-01-10 DIAGNOSIS — I739 Peripheral vascular disease, unspecified: Secondary | ICD-10-CM | POA: Diagnosis not present

## 2024-01-10 MED ORDER — ALBUTEROL SULFATE HFA 108 (90 BASE) MCG/ACT IN AERS: 1.0000 | INHALATION_SPRAY | Freq: Four times a day (QID) | RESPIRATORY_TRACT | 1 refills | Status: AC | PRN

## 2024-01-10 MED ORDER — VARENICLINE TARTRATE 1 MG PO TABS: 1.0000 mg | ORAL_TABLET | Freq: Two times a day (BID) | ORAL | 2 refills | Status: DC

## 2024-01-10 MED ORDER — VARENICLINE TARTRATE (STARTER) 0.5 MG X 11 & 1 MG X 42 PO TBPK: ORAL_TABLET | ORAL | 0 refills | Status: DC

## 2024-01-10 NOTE — Assessment & Plan Note (Signed)
 TOBACCO USE DISORDER: Chronic nicotine dependence with current consumption of two packs per day. Expresses desire to reduce smoking to 2-3 cigarettes per day but is hesitant to quit completely due to personal beliefs. Discussed pharmacotherapy options to aid in smoking reduction. Informed about potential side effects of Chantix , including dry mouth and vivid dreams, and the importance of continuing the medication for three months to reduce cravings. - Prescribe Chantix  starter pack AND continuation pack, contingent on insurance coverage, to aid in reducing smoking. - Encourage reduction of smoking to 2-3 cigarettes per day.  Time spent counseling 5 mins

## 2024-01-10 NOTE — Assessment & Plan Note (Signed)
 COPD with current inhaler use. Reports adequate supply of inhalers but nearing need for refill. - Refill inhaler prescription as needed.

## 2024-01-10 NOTE — Assessment & Plan Note (Signed)
 Presence of actinic keratosis on the scalp, identified as precancerous spots. Discussed treatment options including cryotherapy with liquid nitrogen, which may require multiple treatments every three months. He opted to defer treatment today. - Advise use of sunscreen and wearing a hat to protect from sun exposure. - Offer cryotherapy treatment if he decides to proceed in the future.

## 2024-01-10 NOTE — Assessment & Plan Note (Signed)
 Chronic type 2 diabetes mellitus with complications of diabetic neuropathy affecting the left foot, WITHOUT USE OF INSULIN, causing numbness and balance issues. Current management includes glimepiride  and metformin . Recent A1c was 6.7 in June, indicating reasonable control. Issues with continuous glucose monitor led to exploring alternative monitoring with a wristwatch device. Neuropathy symptoms persist despite current footwear. - Refer to podiatry for evaluation and prescription of custom diabetic shoes. - Continue glimepiride  4 mg daily and metformin  1000 mg twice daily. - Advise to compare wristwatch blood sugar readings with traditional glucometer readings for accuracy. - Encourage follow-up with podiatry on September 26th for diabetic shoe fitting.

## 2024-01-10 NOTE — Progress Notes (Signed)
 Subjective:  Patient ID: Andrew Wiggins, male    DOB: 09/14/1955  Age: 68 y.o. MRN: [id number]  Chief Complaint  Patient presents with   Medical Management of Chronic Issues    HPI: Discussed the use of AI scribe software for clinical note transcription with the patient, who gave verbal consent to proceed.  Discussed the use of AI scribe software for clinical note transcription with the patient, who gave verbal consent to proceed.  History of Present Illness   Andrew Wiggins is a 68 year old male with diabetes and peripheral neuropathy who presents with numbness and balance issues.  Peripheral neuropathy and gait disturbance - Persistent numbness in the left leg, particularly in the foot - Sensation of the left foot being 'round instead of flat' when walking - Staggering and balance issues associated with numbness - Use of neuropathy-specific shoes ordered online without symptom relief  Diabetes mellitus management - Diabetes managed with glimepiride  4 mg every morning and metformin  1000 mg twice daily - Last hemoglobin A1c was 6.7% in June - Attempted use of a continuous glucose monitor, but device malfunctioned and was discarded - Ordered a watch that claims to monitor blood sugar levels; plans to compare its readings with a traditional glucose meter after battery replacement  Lower back pain and functional limitation - Pain in the lower back limiting ability to walk long distances - Unable to walk 50 feet without pain - Previously able to walk quickly through a warehouse, now limited - No chest pain or shortness of breath with walking  Tobacco use - Smokes two packs of cigarettes daily - Desires to reduce smoking to two or three cigarettes per day  Atherosclerosis and peripheral vascular disease management - History of atherosclerosis and peripheral vascular disease - Takes atorvastatin  40 mg and clopidogrel  (Plavix ) 75 mg daily  Hypertension management - Takes valsartan   160 mg with hydrochlorothiazide  12.5 mg for blood pressure control - Does not check blood pressure frequently at home  Scalp lesion - Rough, dry patch on the scalp that is sore - No current treatment  Tick bite - Tick bite in the left groin area approximately three weeks ago - Presented as a bruise, now improved  Dentition - Uses full dentures without reported issues            01/10/2024    1:48 PM 01/03/2024    3:00 PM 10/08/2023    1:24 PM 07/03/2023    8:06 AM 07/03/2023    8:05 AM  Depression screen PHQ 2/9  Decreased Interest 0 0 0 0 0  Down, Depressed, Hopeless 0 0 0 0 0  PHQ - 2 Score 0 0 0 0 0  Altered sleeping 0  0    Tired, decreased energy 0  0    Change in appetite 0  0    Feeling bad or failure about yourself  0  0    Trouble concentrating 0  0    Moving slowly or fidgety/restless 0  0    Suicidal thoughts 0  0    PHQ-9 Score 0  0    Difficult doing work/chores Not difficult at all  Not difficult at all          10/08/2023    1:24 PM  Fall Risk   Falls in the past year? 0  Number falls in past yr: 0  Injury with Fall? 0  Risk for fall due to : No Fall Risks  Patient Care Team: Braylin Formby, MD as PCP - General (Family Medicine) Inocencio Soyla Lunger, MD as PCP - Electrophysiology (Cardiology) Nyle Rankin POUR, [facility] (Inactive) (Pharmacist) Revankar, Jennifer SAUNDERS, MD as Consulting Physician (Cardiology)   Review of Systems  Constitutional:  Negative for chills, fatigue, fever and unexpected weight change.  HENT:  Negative for congestion, ear pain, sinus pain and sore throat.   Respiratory:  Negative for cough and shortness of breath.   Cardiovascular:  Negative for chest pain and palpitations.  Gastrointestinal:  Negative for abdominal pain, blood in stool, constipation, diarrhea, nausea and vomiting.  Endocrine: Negative for polydipsia.  Genitourinary:  Negative for dysuria.  Musculoskeletal:  Negative for back pain.  Skin:  Negative for rash.   Neurological:  Positive for numbness (neuropathy in the feet). Negative for headaches.    Current Outpatient Medications on File Prior to Visit  Medication Sig Dispense Refill   aspirin  EC 81 MG tablet Take 81 mg by mouth daily. Swallow whole.     atorvastatin  (LIPITOR) 40 MG tablet Take 1 tablet (40 mg total) by mouth daily. 90 tablet 3   clopidogrel  (PLAVIX ) 75 MG tablet Take 1 tablet (75 mg total) by mouth every morning. 90 tablet 1   Continuous Glucose Monitor Sup MISC To use for continuous glucose monitor 1 each 0   FEROSUL 325 (65 Fe) MG tablet TAKE 1 TABLET BY MOUTH EVERY MORNING 30 tablet 11   gabapentin  (NEURONTIN ) 600 MG tablet TAKE 1 TABLET BY MOUTH 3 TIMES DAILY 270 tablet 11   glimepiride  (AMARYL ) 4 MG tablet TAKE 1 TABLET BY MOUTH EVERY MORNING 30 tablet 11   metFORMIN  (GLUCOPHAGE ) 1000 MG tablet Take 1 tablet (1,000 mg total) by mouth 2 (two) times daily with a meal. 180 tablet 1   naloxone  (NARCAN ) nasal spray 4 mg/0.1 mL 1 Spray As Needed in nostril for accidental overdose- call 911 if used 2 each 0   Omega-3 Fatty Acids (OMEGA 3 FISH OIL ) 1000 MG CAPS 1 capsule.     [START ON 01/28/2024] oxyCODONE -acetaminophen  (PERCOCET) 10-325 MG tablet Take 1 tablet by mouth every 8 (eight) hours as needed. 90 tablet 0   pantoprazole (PROTONIX) 40 MG tablet Take 40 mg by mouth every morning.     valsartan -hydrochlorothiazide  (DIOVAN -HCT) 160-12.5 MG tablet TAKE 1 TABLET BY MOUTH EVERY MORNING 30 tablet 11   No current facility-administered medications on file prior to visit.   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 ~  Jan11:  Last seen by Cards & pacer reprogrammed...     Cardiac  pacemaker in situ    Carotid artery disease (HCC) 07/27/2010   R/O PERIPHERAL VASCULAR DISEASE (ICD-443.9) - on ASA 81mg /d... exam 11/09 shows gr 1-2 sys murmur LSB, but also has prominent bruit to base of right side of his neck w/ right > left Carotid Bruit... ~  CDopplers 11/09 showed severe plaque right ICA, & min plaque on left... no signif ICA stenoses per report. ~  f/u CDopplers 12/10 showed mild irreg plaque bilat in bulbs & intimal thickening in righ   Cellulitis    Complete AV block (HCC)    history of  COPD (chronic obstructive pulmonary disease) with chronic bronchitis (HCC) 03/26/2008   CHRONIC OBSTRUCTIVE PULMONARY DISEASE (ICD-496) - hx recurrent bronchitic episodes in the past... smokes 1/2 - 1ppd x yrs... ~  PFT's 11/08 showed FVC= 3.85 (79%), FEV1= 2.19 (55%), %1sec= 57, mid-flows= 32% predicted:  all c/w mod airflow obstruction...  ~  CXR 12/10 showed left subclav pacer, mild hyperinflation, NAD...     Depression    Dermatitis, contact    due to poison ivy   Diabetes mellitus (HCC) 02/04/2020   Diabetes mellitus with neuropathy (HCC) 12/20/2016   Dyslipidemia 03/06/2012   Encounter for prostate cancer screening 06/21/2022   Essential hypertension 04/10/2007    HYPERTENSION (ICD-401.9) - prev on Micardis 80mg /d then switched to Losartan 100mg /d, but he lost his job in 2011& couldn't afford Rx;  He called us  2/12 w/ this news & we phoned in Lisinopril  20mg /d... ~  2DEcho 11/09 showed sl incr LVwall thickness, norm LVF w/ EF= 55-60%, no regional wall motion abn, mild DD, mild MR... ~  Myoview  12/09 showed LBBB, no perfusion defects, abn septal motion & EF   GERD 03/26/2008    GERD (ICD-530.81) - prev mild GERD symptoms that improved after cholecystectomy on 2000... uses OTC PPI as needed... ~  he is due for routine screening colonoscopy...       Iron  deficiency anemia due to chronic blood loss 06/21/2022   Mixed hyperlipidemia 06/19/2019   PVD (peripheral vascular disease) (HCC)     Sciatica 11/18/2019   Tobacco use 02/04/2020   Tobacco use disorder    Past Surgical History:  Procedure Laterality Date   ABDOMINAL AORTOGRAM W/LOWER EXTREMITY N/A 12/14/2020   Procedure: ABDOMINAL AORTOGRAM W/LOWER EXTREMITY;  Surgeon: Serene Gaile ORN, MD;  Location: [facility];  Service: Cardiovascular;  Laterality: N/A;   ABDOMINAL AORTOGRAM W/LOWER EXTREMITY N/A 05/24/2021   Procedure: ABDOMINAL AORTOGRAM W/LOWER EXTREMITY;  Surgeon: Serene Gaile ORN, MD;  Location: [facility];  Service: Cardiovascular;  Laterality: N/A;   ABDOMINAL AORTOGRAM W/LOWER EXTREMITY N/A 07/28/2022   Procedure: ABDOMINAL AORTOGRAM W/LOWER EXTREMITY;  Surgeon: Magda Debby SAILOR, MD;  Location: [facility];  Service: Cardiovascular;  Laterality: N/A;   ABDOMINAL AORTOGRAM W/LOWER EXTREMITY N/A 06/08/2023   Procedure: ABDOMINAL AORTOGRAM W/LOWER EXTREMITY;  Surgeon: Magda Debby SAILOR, MD;  Location: [facility];  Service: Cardiovascular;  Laterality: N/A;   CATARACT EXTRACTION Bilateral    laproscopic cholecystectomy  05/08/1998   by Dr. Cephus   MOUTH SURGERY     PACEMAKER INSERTION  03/08/2008   by Dr. Waddell   PERIPHERAL VASCULAR BALLOON ANGIOPLASTY Left 06/08/2023   Procedure: PERIPHERAL VASCULAR BALLOON ANGIOPLASTY;  Surgeon: Magda Debby SAILOR, MD;  Location: [facility];  Service: Cardiovascular;  Laterality: Left;  SFA   PERIPHERAL VASCULAR INTERVENTION Left 12/14/2020   Procedure: PERIPHERAL VASCULAR INTERVENTION;  Surgeon: Serene Gaile ORN, MD;  Location: [facility];  Service: Cardiovascular;  Laterality: Left;  SFA   PERIPHERAL VASCULAR INTERVENTION Left 05/24/2021   Procedure: PERIPHERAL VASCULAR INTERVENTION;  Surgeon: Serene Gaile ORN, MD;  Location: [facility];  Service: Cardiovascular;  Laterality: Left;   PERIPHERAL VASCULAR INTERVENTION Left 07/28/2022   Procedure: PERIPHERAL VASCULAR INTERVENTION;  Surgeon: Magda Debby SAILOR, MD;  Location:  [facility];  Service: Cardiovascular;  Laterality: Left;  Lt SFA   PPM GENERATOR CHANGEOUT N/A 05/25/2021   Procedure: PPM GENERATOR CHANGEOUT;  Surgeon: Inocencio Soyla Lunger, MD;  Location[facility] INVASIVE CV  LAB;  Service: Cardiovascular;  Laterality: N/A;    Family History  Problem Relation Age of Onset   Heart disease Brother    Leukemia Brother    Social History   Socioeconomic History   Marital status: Legally Separated    Spouse name: Not on file   Number of children: 2   Years of education: 11   Highest education level: Not on file  Occupational History   Occupation: unemployed    Comment: works for himself   Occupation: RETIRED FROM [employer]  Tobacco Use   Smoking status: Every Day    Current packs/day: 0.50    Average packs/day: 0.5 packs/day for 25.0 years (12.5 ttl pk-yrs)    Types: Cigarettes    Passive exposure: Never   Smokeless tobacco: Never  Vaping Use   Vaping status: Never Used  Substance and Sexual Activity   Alcohol use: Not Currently    Alcohol/week: 0.0 standard drinks of alcohol   Drug use: No   Sexual activity: Not Currently  Other Topics Concern   Not on file  Social History Narrative   PT HAS VERY VAGUE FAMILY HISTORY - HE WAS RAISED IN [facility]   Social Drivers of Health   Financial Resource Strain: Low Risk  (07/03/2023)   Overall Financial Resource Strain (CARDIA)    Difficulty of Paying Living Expenses: Not hard at all  Food Insecurity: No Food Insecurity (07/03/2023)   Hunger Vital Sign    Worried About Running Out of Food in the Last Year: Never true    Ran Out of Food in the Last Year: Never true  Transportation Needs: No Transportation Needs (07/03/2023)   PRAPARE - Administrator, Civil Service (Medical): No    Lack of Transportation (Non-Medical): No  Physical Activity: Insufficiently Active (07/03/2023)   Exercise Vital Sign    Days of Exercise per Week: 3 days    Minutes of Exercise per Session: 30 min   Stress: No Stress Concern Present (07/03/2023)   [employer] of Occupational Health - Occupational Stress Questionnaire    Feeling of Stress : Not at all  Social Connections: Socially Isolated (07/03/2023)   Social Connection and Isolation Panel    Frequency of Communication with Friends and Family: More than three times a week    Frequency of Social Gatherings with Friends and Family: Twice a week    Attends Religious Services: Never    Database administrator or Organizations: No    Attends Engineer, structural: Never    Marital Status: Separated    Objective:  BP (!) 140/60   Pulse 96   Temp 97.7 F (36.5 C)   Resp 16   Ht 5' 10 (1.778 m)   Wt 150 lb (68 kg)   SpO2 95%   BMI 21.52 kg/m      01/10/2024    1:40 PM 01/03/2024    3:45 PM 01/03/2024    3:42 PM  BP/Weight  Systolic BP 140 177 179  Diastolic BP 60 72 72  Wt. (Lbs) 150  147.9  BMI 21.52 kg/m2  21.22 kg/m2    Physical Exam Vitals and nursing note reviewed.  Constitutional:      Appearance: Normal appearance.  HENT:     Head: Normocephalic and atraumatic.     Mouth/Throat:     Comments: Dentures both jaws Eyes:     Pupils: Pupils are equal, round, and reactive to light.  Cardiovascular:  Rate and Rhythm: Normal rate and regular rhythm.  Pulmonary:     Effort: Pulmonary effort is normal.     Breath sounds: Normal breath sounds.  Musculoskeletal:     Cervical back: Normal range of motion.  Skin:    Comments: AK 2 of them noted on the scalp  Neurological:     General: No focal deficit present.     Mental Status: He is alert and oriented to person, place, and time.  Psychiatric:        Mood and Affect: Mood normal.         Lab Results  Component Value Date   WBC 11.0 (H) 01/03/2024   HGB 14.8 01/03/2024   HCT 43.2 01/03/2024   PLT 281 01/03/2024   GLUCOSE 66 (L) 10/08/2023   CHOL 100 10/08/2023   TRIG 160 (H) 10/08/2023   HDL 27 (L) 10/08/2023   LDLDIRECT 113.0  12/20/2017   LDLCALC 46 10/08/2023   ALT 16 10/08/2023   AST 17 10/08/2023   NA 139 10/08/2023   K 4.5 10/08/2023   CL 102 10/08/2023   CREATININE 1.04 10/08/2023   BUN 10 10/08/2023   CO2 22 10/08/2023   TSH 1.890 06/21/2022   PSA 0.63 12/20/2017   HGBA1C 6.7 (H) 10/08/2023      Assessment & Plan:  Controlled type 2 diabetes mellitus with complication, without long-term current use of insulin (HCC) Assessment & Plan: Chronic type 2 diabetes mellitus with complications of diabetic neuropathy affecting the left foot, WITHOUT USE OF INSULIN, causing numbness and balance issues. Current management includes glimepiride  and metformin . Recent A1c was 6.7 in June, indicating reasonable control. Issues with continuous glucose monitor led to exploring alternative monitoring with a wristwatch device. Neuropathy symptoms persist despite current footwear. - Refer to podiatry for evaluation and prescription of custom diabetic shoes. - Continue glimepiride  4 mg daily and metformin  1000 mg twice daily. - Advise to compare wristwatch blood sugar readings with traditional glucometer readings for accuracy. - Encourage follow-up with podiatry on September 26th for diabetic shoe fitting.   PVD (peripheral vascular disease) (HCC) Assessment & Plan: Peripheral vascular disease managed with atorvastatin  and Plavix . Importance of medication adherence emphasized for cardiovascular protection. - Continue atorvastatin  40 mg daily. - Continue Plavix  75 mg daily. Strongly recommended to cut back on tobacco use.   COPD (chronic obstructive pulmonary disease) with chronic bronchitis (HCC) Assessment & Plan: COPD with current inhaler use. Reports adequate supply of inhalers but nearing need for refill. - Refill inhaler prescription as needed.   Tobacco use disorder Assessment & Plan: TOBACCO USE DISORDER: Chronic nicotine dependence with current consumption of two packs per day. Expresses desire to reduce  smoking to 2-3 cigarettes per day but is hesitant to quit completely due to personal beliefs. Discussed pharmacotherapy options to aid in smoking reduction. Informed about potential side effects of Chantix , including dry mouth and vivid dreams, and the importance of continuing the medication for three months to reduce cravings. - Prescribe Chantix  starter pack AND continuation pack, contingent on insurance coverage, to aid in reducing smoking. - Encourage reduction of smoking to 2-3 cigarettes per day.  Time spent counseling 5 mins   Type 2 diabetes mellitus with diabetic neuropathy, without long-term current use of insulin (HCC) -     Albuterol  Sulfate HFA; Inhale 1-2 puffs into the lungs every 6 (six) hours as needed for wheezing or shortness of breath.  Dispense: 18 g; Refill: 1  Encounter for immunization -  Flu vaccine HIGH DOSE PF(Fluzone Trivalent)  Multiple actinic keratoses Assessment & Plan: Presence of actinic keratosis on the scalp, identified as precancerous spots. Discussed treatment options including cryotherapy with liquid nitrogen, which may require multiple treatments every three months. He opted to defer treatment today. - Advise use of sunscreen and wearing a hat to protect from sun exposure. - Offer cryotherapy treatment if he decides to proceed in the future.       Other orders -     Varenicline  Tartrate (Starter); Take 0.5 mg by mouth daily for 3 days, THEN 0.5 mg 2 (two) times daily for 4 days, THEN 1 mg 2 (two) times daily for 21 days.  Dispense: 1 each; Refill: 0 -     Varenicline  Tartrate; Take 1 tablet (1 mg total) by mouth 2 (two) times daily.  Dispense: 60 tablet; Refill: 2    Assessment and Plan     Hypertension Hypertension with current medication regimen including valsartan  with hydrochlorothiazide . Blood pressure slightly elevated today. Has a home blood pressure monitor but checks infrequently. - Recheck blood pressure before departure. - Advise  checking blood pressure at home 2-3 times per week, particularly in the evenings.  Chronic obstructive pulmonary disease (COPD) COPD with current inhaler use. Reports adequate supply of inhalers but nearing need for refill. - Refill inhaler prescription as needed.  Hyperlipidemia Hyperlipidemia managed with atorvastatin . Recent cholesterol panel in June showed low HDL levels. - Continue atorvastatin  40 mg daily.   Assessment and Plan      Meds ordered this encounter  Medications   Varenicline  Tartrate, Starter, (CHANTIX  STARTING MONTH PAK) 0.5 MG X 11 & 1 MG X 42 TBPK    Sig: Take 0.5 mg by mouth daily for 3 days, THEN 0.5 mg 2 (two) times daily for 4 days, THEN 1 mg 2 (two) times daily for 21 days.    Dispense:  1 each    Refill:  0   varenicline  (CHANTIX  CONTINUING MONTH PAK) 1 MG tablet    Sig: Take 1 tablet (1 mg total) by mouth 2 (two) times daily.    Dispense:  60 tablet    Refill:  2   albuterol  (VENTOLIN  HFA) 108 (90 Base) MCG/ACT inhaler    Sig: Inhale 1-2 puffs into the lungs every 6 (six) hours as needed for wheezing or shortness of breath.    Dispense:  18 g    Refill:  1    Orders Placed This Encounter  Procedures   Flu vaccine HIGH DOSE PF(Fluzone Trivalent)     Follow-up: Return in about 4 weeks (around 02/07/2024) for chronic disease follow up.  An After Visit Summary was printed and given to the patient.  Emsley Custer, MD [facility] [phone]

## 2024-01-10 NOTE — Assessment & Plan Note (Signed)
 Peripheral vascular disease managed with atorvastatin  and Plavix . Importance of medication adherence emphasized for cardiovascular protection. - Continue atorvastatin  40 mg daily. - Continue Plavix  75 mg daily. Strongly recommended to cut back on tobacco use.

## 2024-01-14 ENCOUNTER — Other Ambulatory Visit: Payer: Self-pay

## 2024-01-14 DIAGNOSIS — I739 Peripheral vascular disease, unspecified: Secondary | ICD-10-CM

## 2024-01-28 ENCOUNTER — Other Ambulatory Visit (HOSPITAL_BASED_OUTPATIENT_CLINIC_OR_DEPARTMENT_OTHER): Payer: Self-pay

## 2024-01-28 NOTE — Progress Notes (Unsigned)
 HISTORY AND PHYSICAL     CC:  follow up. Primary Care Doctor:  Sirivol, Mamatha, MD  HPI: This is a 68 y.o. male who is here today for follow up for PAD.  Pt has hx of left SFA stenting for toe ulcer in January 2023 and left SFA drug coated balloon angioplasty for rest pain by Dr. Serene.  On 07/28/2022, he underwent angiogram with left SFA angioplasty and stenting for LLE ulceration.  Pt was seen 05/07/2023 and at that time, he was having some erythema and cold feeling in the left foot.  He woke up with right leg  pain. He was found to have some cellulitis and was started on Keflex  and brought back for studies.   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   He states that he took two weeks of abx and he feels the redness got better and has started getting red again.  He does not have any sores on his feet, but at night he does get numbness in his foot and it feels better to hang his foot off the bed and put on the cold floor.  He states the redness gets better when his leg is elevated and gets worse when his foot is hanging down.  He states that he does get some burning in the 4th and 5th toes. He states in the past 2-3 days has started getting severe cramping in the left calf with walking.  He does not think he could walk a city block due to this. He states this stent has been working for 10 months.   He does continue to smoke but says he is trying to quit.    is compliant with his asa/statin/plavix .   He has hx of PPM.   The pt is on a statin for cholesterol management.    The pt is on an aspirin .    Other AC:  Plavix  The pt is on ARB, diuretic for hypertension.  The pt is is on medication for diabetes. Tobacco hx:  current  Pt does not have family hx of AAA.  07/17/23: patient returns after left superficial femoral artery drug-coated angioplasty (5x148mm INPact), and left external iliac artery drug-coated angioplasty (6x39mm INPact) 06/08/23. He reports resolution of symptoms in  left lower extremity. He is overall doing well. No claudication. No rest pain. No ulceration.   01/29/24 returns to clinic for surveillance.  He has persistent numbness of the left plantar foot.  He has no claudication, rest pain, or ischemic ulceration symptoms.  He continues to smoke.  We reviewed his duplex in detail.  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 ~  Jan11:  Last seen by Cards & pacer reprogrammed...     Cardiac pacemaker in situ    Carotid artery disease 07/27/2010   R/O PERIPHERAL VASCULAR DISEASE (ICD-443.9) - on ASA 81mg /d... exam 11/09 shows gr 1-2 sys murmur LSB, but also has prominent bruit to base of right side of his neck w/ right > left Carotid Bruit... ~  CDopplers 11/09 showed severe plaque right ICA, & min plaque on left... no signif ICA stenoses per report. 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 smokes 1/2 - 1ppd x yrs... ~  PFT's 11/08 showed FVC= 3.85 (79%), FEV1= 2.19 (55%), %1sec= 57, mid-flows= 32% predicted:  all c/w mod airflow obstruction...  ~  CXR 12/10 showed left subclav pacer, mild hyperinflation, NAD...     Depression    Dermatitis, contact    due to poison ivy   Diabetes mellitus (HCC) 02/04/2020   Diabetes mellitus with neuropathy (HCC) 12/20/2016   Dyslipidemia 03/06/2012   Encounter for prostate cancer screening 06/21/2022   Essential hypertension 04/10/2007     HYPERTENSION (ICD-401.9) - prev on Micardis 80mg /d then switched to Losartan 100mg /d, but he lost his job in 2011& couldn't afford Rx;  He called us  2/12 w/ this news & we phoned in Lisinopril  20mg /d... ~  2DEcho 11/09 showed sl incr LVwall thickness, norm LVF w/ EF= 55-60%, no regional wall motion abn, mild DD, mild MR... ~  Myoview  12/09 showed LBBB, no perfusion defects, abn septal motion & EF   GERD 03/26/2008    GERD (ICD-530.81) - prev mild GERD symptoms that improved after cholecystectomy on 2000... uses OTC PPI as needed... ~  he is due for routine screening colonoscopy...       Iron  deficiency anemia due to chronic blood loss 06/21/2022   Mixed hyperlipidemia 06/19/2019   PVD (peripheral vascular disease)    Sciatica 11/18/2019   Tobacco use 02/04/2020   Tobacco use disorder     Past Surgical History:  Procedure Laterality Date   ABDOMINAL AORTOGRAM W/LOWER EXTREMITY N/A 12/14/2020   Procedure: ABDOMINAL AORTOGRAM W/LOWER EXTREMITY;  Surgeon: Serene Gaile ORN, MD;  Location: [facility];  Service: Cardiovascular;  Laterality: N/A;   ABDOMINAL AORTOGRAM W/LOWER EXTREMITY N/A 05/24/2021   Procedure: ABDOMINAL AORTOGRAM W/LOWER EXTREMITY;  Surgeon: Serene Gaile ORN, MD;  Location: [facility];  Service: Cardiovascular;  Laterality: N/A;   ABDOMINAL AORTOGRAM W/LOWER EXTREMITY N/A 07/28/2022   Procedure: ABDOMINAL AORTOGRAM W/LOWER EXTREMITY;  Surgeon: Magda Debby SAILOR, MD;  Location: [facility];  Service: Cardiovascular;  Laterality: N/A;   ABDOMINAL AORTOGRAM W/LOWER EXTREMITY N/A 06/08/2023   Procedure: ABDOMINAL AORTOGRAM W/LOWER EXTREMITY;  Surgeon: Magda Debby SAILOR, MD;  Location: [facility];  Service: Cardiovascular;  Laterality: N/A;   CATARACT EXTRACTION Bilateral    laproscopic cholecystectomy  05/08/1998   by Dr. Cephus   MOUTH SURGERY     PACEMAKER INSERTION  03/08/2008   by Dr. Waddell   PERIPHERAL VASCULAR BALLOON ANGIOPLASTY Left 06/08/2023    Procedure: PERIPHERAL VASCULAR BALLOON ANGIOPLASTY;  Surgeon: Magda Debby SAILOR, MD;  Location: [facility];  Service: Cardiovascular;  Laterality: Left;  SFA   PERIPHERAL VASCULAR INTERVENTION Left 12/14/2020   Procedure: PERIPHERAL VASCULAR INTERVENTION;  Surgeon: Serene Gaile ORN, MD;  [facility]: [facility];  Service: Cardiovascular;  Laterality: Left;  SFA   PERIPHERAL VASCULAR INTERVENTION Left 05/24/2021   Procedure: PERIPHERAL VASCULAR INTERVENTION;  Surgeon: Serene Gaile ORN, MD;  [facility]: [facility];  Service: Cardiovascular;  Laterality: Left;   PERIPHERAL VASCULAR INTERVENTION Left 07/28/2022   Procedure: PERIPHERAL VASCULAR INTERVENTION;  Surgeon: Magda Debby SAILOR, MD;  [facility]: [facility];  Service: Cardiovascular;  Laterality: Left;  Lt SFA   PPM GENERATOR CHANGEOUT N/A 05/25/2021   Procedure: PPM GENERATOR CHANGEOUT;  Surgeon: Inocencio Soyla Lunger, MD;  Location: [facility];  Service: Cardiovascular;  Laterality: N/A;    No Known Allergies  Current Outpatient Medications  Medication Sig Dispense Refill   albuterol  (VENTOLIN  HFA) 108 (90 Base) MCG/ACT inhaler Inhale 1-2 puffs into the lungs every 6 (six) hours as needed for wheezing or shortness of breath. 18 g 1   aspirin  EC 81 MG tablet Take 81 mg by mouth daily. Swallow whole.     atorvastatin  (LIPITOR) 40 MG tablet Take 1 tablet (40 mg total) by mouth daily. 90 tablet 3   clopidogrel  (PLAVIX ) 75 MG tablet TAKE 1 TABLET BY MOUTH EVERY MORNING 90 tablet 11   Continuous Glucose Monitor Sup MISC To use for continuous glucose monitor 1 each 0   FEROSUL 325 (65 Fe) MG tablet TAKE 1 TABLET BY MOUTH EVERY MORNING 30 tablet 11   gabapentin  (NEURONTIN ) 600 MG tablet TAKE 1 TABLET BY MOUTH 3 TIMES DAILY 270 tablet 11   glimepiride  (AMARYL ) 4 MG tablet TAKE 1 TABLET BY MOUTH EVERY MORNING 30 tablet 11   metFORMIN  (GLUCOPHAGE ) 1000 MG tablet TAKE 1 TABLET BY MOUTH TWICE DAILY WITH A MEAL 180 tablet  11   naloxone  (NARCAN ) nasal spray 4 mg/0.1 mL 1 Spray As Needed in nostril for accidental overdose- call 911 if used 2 each 0   Omega-3 Fatty Acids (OMEGA 3 FISH OIL ) 1000 MG CAPS 1 capsule.     oxyCODONE -acetaminophen  (PERCOCET) 10-325 MG tablet Take 1 tablet by mouth every 8 (eight) hours as needed. 90 tablet 0   pantoprazole (PROTONIX) 40 MG tablet Take 40 mg by mouth every morning.     valsartan -hydrochlorothiazide  (DIOVAN -HCT) 160-12.5 MG tablet TAKE 1 TABLET BY MOUTH EVERY MORNING 30 tablet 11   varenicline  (CHANTIX  CONTINUING MONTH PAK) 1 MG tablet Take 1 tablet (1 mg total) by mouth 2 (two) times daily. 60 tablet 2   Varenicline  Tartrate, Starter, (CHANTIX  STARTING MONTH PAK) 0.5 MG X 11 & 1 MG X 42 TBPK Take 0.5 mg by mouth daily for 3 days, THEN 0.5 mg 2 (two) times daily for 4 days, THEN 1 mg 2 (two) times daily for 21 days. 1 each 0   No current facility-administered medications for this visit.    Family History  Problem Relation Age of Onset   Heart disease Brother    Leukemia Brother     Social History   Socioeconomic History   Marital status: Legally Separated    Spouse name: Not on file   Number of children: 2   Years of education: 11   Highest education level: Not on file  Occupational History   Occupation: unemployed    Comment: works for himself   Occupation: RETIRED FROM [employer]  Tobacco Use   Smoking status: Every Day    Current packs/day: 0.50    Average packs/day: 0.5 packs/day for 25.0 years (12.5 ttl pk-yrs)    Types: Cigarettes    Passive exposure: Never   Smokeless tobacco: Never  Vaping Use   Vaping status: Never Used  Substance and Sexual Activity   Alcohol use: Not Currently    Alcohol/week: 0.0 standard drinks of alcohol   Drug use: No   Sexual activity: Not Currently  Other Topics Concern   Not on file  Social History Narrative   PT HAS VERY VAGUE FAMILY HISTORY - HE WAS RAISED  IN [facility]   Social Drivers of Health    Financial Resource Strain: Low Risk  (07/03/2023)   Overall Financial Resource Strain (CARDIA)    Difficulty of Paying Living Expenses: Not hard at all  Food Insecurity: No Food Insecurity (07/03/2023)   Hunger Vital Sign    Worried About Running Out of Food in the Last Year: Never true    Ran Out of Food in the Last Year: Never true  Transportation Needs: No Transportation Needs (07/03/2023)   PRAPARE - Administrator, Civil Service (Medical): No    Lack of Transportation (Non-Medical): No  Physical Activity: Insufficiently Active (07/03/2023)   Exercise Vital Sign    Days of Exercise per Week: 3 days    Minutes of Exercise per Session: 30 min  Stress: No Stress Concern Present (07/03/2023)   [employer] of Occupational Health - Occupational Stress Questionnaire    Feeling of Stress : Not at all  Social Connections: Socially Isolated (07/03/2023)   Social Connection and Isolation Panel    Frequency of Communication with Friends and Family: More than three times a week    Frequency of Social Gatherings with Friends and Family: Twice a week    Attends Religious Services: Never    Database administrator or Organizations: No    Attends Banker Meetings: Never    Marital Status: Separated  Intimate Partner Violence: Not At Risk (07/03/2023)   Humiliation, Afraid, Rape, and Kick questionnaire    Fear of Current or Ex-Partner: No    Emotionally Abused: No    Physically Abused: No    Sexually Abused: No    PHYSICAL EXAMINATION:  Today's Vitals   01/29/24 1418  BP: (!) 143/72  Pulse: 81  Temp: 98.2 F (36.8 C)  SpO2: 96%  Weight: 147 lb (66.7 kg)  Height: 5' 10 (1.778 m)     Body mass index is 21.09 kg/m.  Chronically ill man in no distress Regular rate and rhythm Unlabored breathing Brisk doppler flow in L PT and AT Brisk cap refill <2 sec  Non-Invasive Vascular Imaging:   +-------+-----------+-----------+------------+------------+   ABI/TBIToday's ABIToday's TBIPrevious ABIPrevious TBI  +-------+-----------+-----------+------------+------------+  Right 0.80       0.58       0.90        0.57          +-------+-----------+-----------+------------+------------+  Left  0.84       0.62       0.97        0.66          +-------+-----------+-----------+------------+------------+     Left Lower extremity arterial duplex  Left: 50-74% stenosis noted in the common femoral artery.   50-74% stenosis noted in the proximal superficial femoral artery.  50-74% stenosis noted in the deep femoral artery.  Widely patent mid to distal SFA stent.       ASSESSMENT/PLAN:  JACARI IANNELLO is a 68 y.o. male with atherosclerosis of native arteries of left lower extremity status post angioplasty.  Development of plaque in the common femoral and proximal SFA/DFA noted which likely explains his drop in ankle pressure.  This will need attention going forward.  Recommend:  Abstinence from all tobacco products. Blood glucose control with goal A1c < 7%. Blood pressure control with goal blood pressure < 140/90 mmHg. Lipid reduction therapy with goal LDL-C <100 mg/dL Aspirin  81mg  PO QD.  Clopidogrel  75mg  PO QD. Atorvastatin  40-80mg  PO QD (or other high intensity statin  therapy).  Follow up with me or PA in 6 months with ABI and LLE duplex  Debby SAILOR. Magda, MD [facility] Phone Number: [phone] 01/29/2024 2:41 PM

## 2024-01-29 ENCOUNTER — Ambulatory Visit (INDEPENDENT_AMBULATORY_CARE_PROVIDER_SITE_OTHER): Admitting: Vascular Surgery

## 2024-01-29 ENCOUNTER — Encounter: Payer: Self-pay | Admitting: Vascular Surgery

## 2024-01-29 ENCOUNTER — Ambulatory Visit (HOSPITAL_COMMUNITY)
Admission: RE | Admit: 2024-01-29 | Discharge: 2024-01-29 | Disposition: A | Discharge status: Home / Self Care | Source: Ambulatory Visit | Attending: Vascular Surgery | Admitting: Vascular Surgery

## 2024-01-29 ENCOUNTER — Ambulatory Visit (HOSPITAL_BASED_OUTPATIENT_CLINIC_OR_DEPARTMENT_OTHER)
Admission: RE | Admit: 2024-01-29 | Discharge: 2024-01-29 | Disposition: A | Discharge status: Home / Self Care | Source: Ambulatory Visit | Attending: Vascular Surgery

## 2024-01-29 VITALS — BP 143/72 | HR 81 | Temp 98.2°F | Ht 70.0 in | Wt 147.0 lb

## 2024-01-29 DIAGNOSIS — I70222 Atherosclerosis of native arteries of extremities with rest pain, left leg: Secondary | ICD-10-CM | POA: Diagnosis not present

## 2024-01-29 DIAGNOSIS — I739 Peripheral vascular disease, unspecified: Secondary | ICD-10-CM | POA: Insufficient documentation

## 2024-01-29 LAB — VAS US ABI WITH/WO TBI
Left ABI: 0.84
Right ABI: 0.8

## 2024-01-30 ENCOUNTER — Other Ambulatory Visit (HOSPITAL_BASED_OUTPATIENT_CLINIC_OR_DEPARTMENT_OTHER): Payer: Self-pay

## 2024-01-31 ENCOUNTER — Ambulatory Visit (INDEPENDENT_AMBULATORY_CARE_PROVIDER_SITE_OTHER): Admitting: Podiatry

## 2024-01-31 ENCOUNTER — Other Ambulatory Visit: Payer: Self-pay | Admitting: *Deleted

## 2024-01-31 DIAGNOSIS — M79675 Pain in left toe(s): Secondary | ICD-10-CM

## 2024-01-31 DIAGNOSIS — M79674 Pain in right toe(s): Secondary | ICD-10-CM

## 2024-01-31 DIAGNOSIS — B351 Tinea unguium: Secondary | ICD-10-CM | POA: Diagnosis not present

## 2024-01-31 DIAGNOSIS — I739 Peripheral vascular disease, unspecified: Secondary | ICD-10-CM

## 2024-01-31 DIAGNOSIS — I70222 Atherosclerosis of native arteries of extremities with rest pain, left leg: Secondary | ICD-10-CM

## 2024-01-31 NOTE — Progress Notes (Signed)
 Subjective:  Patient ID: Andrew Wiggins, male    DOB: 01/27/1956,  MRN: [id number]  Andrew Wiggins presents to clinic today for:  Chief Complaint  Patient presents with   [facility]    [facility] with callous on medial side of right hallux.  Has some nerve pain A1c was 5.8 in July Plavix  and ASA   Patient notes nails are thick, discolored, elongated and painful in shoegear when trying to ambulate.    PCP is Sirivol, Mamatha, MD.  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 ~  Jan11:  Last seen by Cards & pacer reprogrammed...     Cardiac pacemaker in situ    Carotid artery disease 07/27/2010   R/O PERIPHERAL VASCULAR DISEASE (ICD-443.9) - on ASA 81mg /d... exam 11/09 shows gr 1-2 sys murmur LSB, but also has prominent bruit to base of right side of his neck w/ right > left Carotid Bruit... ~  CDopplers 11/09 showed severe plaque right ICA, & min plaque on left... no signif ICA stenoses per report. 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 smokes 1/2 - 1ppd x yrs... ~  PFT's 11/08 showed FVC= 3.85 (79%), FEV1= 2.19 (55%), %1sec= 57, mid-flows= 32% predicted:  all c/w mod airflow obstruction...  ~  CXR 12/10 showed left subclav pacer, mild hyperinflation, NAD...     Depression    Dermatitis, contact    due to poison ivy   Diabetes mellitus (HCC) 02/04/2020   Diabetes mellitus with neuropathy (HCC) 12/20/2016    Dyslipidemia 03/06/2012   Encounter for prostate cancer screening 06/21/2022   Essential hypertension 04/10/2007    HYPERTENSION (ICD-401.9) - prev on Micardis 80mg /d then switched to Losartan 100mg /d, but he lost his job in 2011& couldn't afford Rx;  He called us  2/12 w/ this news & we phoned in Lisinopril  20mg /d... ~  2DEcho 11/09 showed sl incr LVwall thickness, norm LVF w/ EF= 55-60%, no regional wall motion abn, mild DD, mild MR... ~  Myoview  12/09 showed LBBB, no perfusion defects, abn septal motion & EF   GERD 03/26/2008    GERD (ICD-530.81) - prev mild GERD symptoms that improved after cholecystectomy on 2000... uses OTC PPI as needed... ~  he is due for routine screening colonoscopy...       Iron  deficiency anemia due to chronic blood loss 06/21/2022   Mixed hyperlipidemia 06/19/2019   PVD (peripheral vascular disease)    Sciatica 11/18/2019   Tobacco use 02/04/2020   Tobacco use disorder    Past Surgical History:  Procedure Laterality Date   ABDOMINAL AORTOGRAM W/LOWER EXTREMITY N/A 12/14/2020   Procedure: ABDOMINAL AORTOGRAM W/LOWER EXTREMITY;  Surgeon: Serene Gaile ORN, MD;  Location: [facility];  Service: Cardiovascular;  Laterality: N/A;   ABDOMINAL AORTOGRAM W/LOWER EXTREMITY N/A 05/24/2021   Procedure: ABDOMINAL AORTOGRAM W/LOWER EXTREMITY;  Surgeon: Serene Gaile ORN, MD;  Location: [facility];  Service: Cardiovascular;  Laterality: N/A;   ABDOMINAL AORTOGRAM W/LOWER EXTREMITY N/A 07/28/2022   Procedure: ABDOMINAL AORTOGRAM W/LOWER EXTREMITY;  Surgeon: Magda Debby SAILOR, MD;  Location: [facility];  Service: Cardiovascular;  Laterality: N/A;   ABDOMINAL AORTOGRAM W/LOWER EXTREMITY N/A 06/08/2023   Procedure: ABDOMINAL AORTOGRAM W/LOWER EXTREMITY;  Surgeon: Magda Debby SAILOR, MD;  Location: [facility];  Service: Cardiovascular;  Laterality: N/A;   CATARACT EXTRACTION Bilateral    laproscopic cholecystectomy  05/08/1998   by Dr. Cephus   MOUTH  SURGERY     PACEMAKER INSERTION  03/08/2008   by Dr. Waddell   PERIPHERAL VASCULAR BALLOON ANGIOPLASTY Left 06/08/2023   Procedure: PERIPHERAL VASCULAR BALLOON ANGIOPLASTY;  Surgeon: Magda Debby SAILOR, MD;  Location[facility] CV LAB;  Service: Cardiovascular;  Laterality: Left;  SFA   PERIPHERAL VASCULAR INTERVENTION Left 12/14/2020   Procedure: PERIPHERAL VASCULAR INTERVENTION;  Surgeon: Serene Gaile ORN, MD;  [facility]: [facility];  Service: Cardiovascular;  Laterality: Left;  SFA   PERIPHERAL VASCULAR INTERVENTION Left 05/24/2021   Procedure: PERIPHERAL VASCULAR INTERVENTION;  Surgeon: Serene Gaile ORN, MD;  [facility]: [facility];  Service: Cardiovascular;  Laterality: Left;   PERIPHERAL VASCULAR INTERVENTION Left 07/28/2022   Procedure: PERIPHERAL VASCULAR INTERVENTION;  Surgeon: Magda Debby SAILOR, MD;  [facility]: [facility];  Service: Cardiovascular;  Laterality: Left;  Lt SFA   PPM GENERATOR CHANGEOUT N/A 05/25/2021   Procedure: PPM GENERATOR CHANGEOUT;  Surgeon: Inocencio Soyla Lunger, MD;  Location: [facility];  Service: Cardiovascular;  Laterality: N/A;   No Known Allergies  Review of Systems: Negative except as noted in the HPI.  Objective:  [name] is a pleasant 68 y.o. male in NAD. AAO x 3.  Vascular Examination: Capillary refill time is 3-5 seconds to toes bilateral. Palpable pedal pulses b/l LE. Digital hair present b/l.  Skin temperature gradient WNL b/l. No varicosities b/l. No cyanosis noted b/l.   Dermatological Examination: Pedal skin with normal turgor, texture and tone b/l. No open wounds.  Mild maceration in the left fourth interspace.  Toenails x10 are 3mm thick, discolored, dystrophic with subungual debris. There is pain with compression of the nail plates.  They are elongated x10     Latest Ref Rng & Units 10/08/2023    1:59 PM 07/03/2023    8:54 AM  Hemoglobin A1C  Hemoglobin-A1c 4.8 - 5.6 % 6.7  8.4    Assessment/Plan: 1. Pain  due to onychomycosis of toenails of both feet     The mycotic toenails were sharply debrided x10 with sterile nail nippers and a power debriding burr to decrease bulk/thickness and length.    Styptocaine solution was applied to the left fourth interspace.  Patient encouraged to dry well between the toes after bathing.  Return in about 3 months (around 05/01/2024) for [facility].   Awanda CHARM Imperial, DPM, FACFAS [facility]     [address] [place], [place] [address]                Office [phone]  Fax [phone]

## 2024-02-07 ENCOUNTER — Ambulatory Visit

## 2024-02-07 VITALS — BP 180/68 | HR 80 | Temp 97.9°F | Ht 70.0 in | Wt 149.0 lb

## 2024-02-07 DIAGNOSIS — M47817 Spondylosis without myelopathy or radiculopathy, lumbosacral region: Secondary | ICD-10-CM | POA: Insufficient documentation

## 2024-02-07 DIAGNOSIS — I10 Essential (primary) hypertension: Secondary | ICD-10-CM

## 2024-02-07 DIAGNOSIS — F17200 Nicotine dependence, unspecified, uncomplicated: Secondary | ICD-10-CM | POA: Diagnosis not present

## 2024-02-07 DIAGNOSIS — E1340 Other specified diabetes mellitus with diabetic neuropathy, unspecified: Secondary | ICD-10-CM | POA: Diagnosis not present

## 2024-02-07 DIAGNOSIS — Z79891 Long term (current) use of opiate analgesic: Secondary | ICD-10-CM | POA: Insufficient documentation

## 2024-02-07 DIAGNOSIS — E782 Mixed hyperlipidemia: Secondary | ICD-10-CM

## 2024-02-07 DIAGNOSIS — D500 Iron deficiency anemia secondary to blood loss (chronic): Secondary | ICD-10-CM

## 2024-02-07 NOTE — Assessment & Plan Note (Addendum)
 Type 2 diabetes mellitus, WELL CONTROLLED WITH COMPLICATIONS OF PAD, NEUROPATHY WITHOUT CURRENT LONG TERM USE OF INSULIN with an A1c of 6.7% in June, improved from 8.4%. Blood glucose monitoring with a smartwatch is inaccurate. - Continue glimepiride  4 mg daily - Continue metformin  1000 mg twice daily - Perform urine test to check for proteinuria - Repeat blood work in three months Orders:   Microalbumin/Creatinine Ratio, Urine

## 2024-02-07 NOTE — Assessment & Plan Note (Addendum)
 On atorvastatin  40 mg daily and Fish oil  pills.  Peripheral vascular disease in the left leg with well-managed blood flow s/p surgery - Continue atorvastatin  40 mg daily - Continue Plavix  75 mg daily - Follow up with vascular surgery in six months

## 2024-02-07 NOTE — Patient Instructions (Signed)
  VISIT SUMMARY: You came in for a follow-up visit to discuss your vascular disease, diabetes, hypertension, and smoking habits. We reviewed your current medications and made some plans for ongoing management.  YOUR PLAN: PERIPHERAL VASCULAR DISEASE: You have peripheral vascular disease in your left leg with well-managed blood flow. -Continue taking atorvastatin  40 mg daily. -Continue taking Plavix  75 mg daily. -Follow up with vascular surgery in six months.  TYPE 2 DIABETES MELLITUS: Your diabetes is improving with an A1c of 6.7%. -Continue taking glimepiride  4 mg daily. -Continue taking metformin  1000 mg twice daily. -Perform a urine test to check for protein in your urine. -Repeat blood work in three months.  ESSENTIAL HYPERTENSION: Your blood pressure is generally well-controlled at home. -Recheck your blood pressure before leaving the office. -Continue your current blood pressure medications.  MIXED HYPERLIPIDEMIA: Your cholesterol levels are being managed with atorvastatin . -Continue taking atorvastatin  40 mg daily.  TOBACCO USE DISORDER: You are currently smoking a little over half a pack per day, reduced from a full pack. -Continue to reduce your smoking. -Consider saving money from reduced smoking for future Chantix  purchase.                      Contains text generated by [employer].                                 Contains text generated by [employer]bridge.

## 2024-02-07 NOTE — Assessment & Plan Note (Addendum)
 Elevated bp today States he has much better home Bps. Currently on Diovan  hydrochlorothiazide  160-12.5 mg  Advised to continue to monitor home Bps and Report back if persistently elevated.

## 2024-02-07 NOTE — Assessment & Plan Note (Addendum)
 Normal labs last blood work Will defer blood work at this time

## 2024-02-07 NOTE — Progress Notes (Signed)
 Subjective:  Patient ID: Andrew Wiggins, male    DOB: 11/22/1955  Age: 68 y.o. MRN: [id number]  Chief Complaint  Patient presents with   Medical Management of Chronic Issues    HPI: Discussed the use of AI scribe software for clinical note transcription with the patient, who gave verbal consent to proceed.   Discussed the use of AI scribe software for clinical note transcription with the patient, who gave verbal consent to proceed.  History of Present Illness   Andrew Wiggins is a 68 year old male with vascular disease and diabetes who presents for follow-up.  Tobacco use disorder - Currently smoking a little over half a pack of cigarettes per day, reduced from a full pack - Attempted to use Chantix  for smoking cessation, but unable to obtain due to insurance coverage and cost - Attempting to quit smoking by distracting himself with activities such as woodworking  Peripheral vascular disease - Recent evaluation by vascular surgeon approximately one week ago - Blood flow in the left leg measured decently well. - Scheduled for vascular follow-up in six months - Currently taking atorvastatin  40 mg daily and clopidogrel  (Plavix ) 75 mg daily  Diabetes mellitus type 2 - Currently taking glimepiride  4 mg daily and metformin  1000 mg twice daily - Hemoglobin A1c was 6.7% in June, improved from previous value of 8.4% - Monitors blood glucose at home - Smartwatch is not accurate for blood glucose monitoring  Hypertension - Home blood pressure readings generally in the 120s to 130s systolic over 87 diastolic - One isolated reading of 169 systolic, but no recurrence since - Uses a home blood pressure machine and checks readings regularly  Immunization status - Received influenza vaccination on September 4th  Dermatologic reaction to wearable devices - Wearing a smartwatch causes skin irritation and bruising - History of similar issues with watches in the past, possibly related to body  chemistry           02/07/2024    9:10 AM 01/10/2024    1:48 PM 01/03/2024    3:00 PM 10/08/2023    1:24 PM 07/03/2023    8:06 AM  Depression screen PHQ 2/9  Decreased Interest 0 0 0 0 0  Down, Depressed, Hopeless 0 0 0 0 0  PHQ - 2 Score 0 0 0 0 0  Altered sleeping  0  0   Tired, decreased energy  0  0   Change in appetite  0  0   Feeling bad or failure about yourself   0  0   Trouble concentrating  0  0   Moving slowly or fidgety/restless  0  0   Suicidal thoughts  0  0   PHQ-9 Score  0  0   Difficult doing work/chores  Not difficult at all  Not difficult at all         02/07/2024    9:10 AM  Fall Risk   Falls in the past year? 0  Number falls in past yr: 0  Injury with Fall? 0  Risk for fall due to : No Fall Risks  Follow up Falls evaluation completed    Patient Care Team: Evora Schechter, MD as PCP - General (Family Medicine) Inocencio Soyla Lunger, MD as PCP - Electrophysiology (Cardiology) Nyle Rankin POUR, [facility] (Inactive) (Pharmacist) Revankar, Jennifer SAUNDERS, MD as Consulting Physician (Cardiology)   Review of Systems  Constitutional:  Negative for chills, fatigue and fever.  HENT:  Negative for congestion, ear  pain, sinus pressure and sore throat.   Respiratory:  Negative for cough and shortness of breath.   Cardiovascular:  Negative for chest pain.  Gastrointestinal:  Negative for abdominal pain, constipation, diarrhea, nausea and vomiting.  Genitourinary:  Negative for dysuria and frequency.  Musculoskeletal:  Negative for arthralgias, back pain and myalgias.  Neurological:  Negative for dizziness and headaches.  Psychiatric/Behavioral:  Negative for dysphoric mood. The patient is not nervous/anxious.     Current Outpatient Medications on File Prior to Visit  Medication Sig Dispense Refill   albuterol  (VENTOLIN  HFA) 108 (90 Base) MCG/ACT inhaler Inhale 1-2 puffs into the lungs every 6 (six) hours as needed for wheezing or shortness of breath. 18 g 1   aspirin  EC 81  MG tablet Take 81 mg by mouth daily. Swallow whole.     atorvastatin  (LIPITOR) 40 MG tablet Take 1 tablet (40 mg total) by mouth daily. 90 tablet 3   clopidogrel  (PLAVIX ) 75 MG tablet TAKE 1 TABLET BY MOUTH EVERY MORNING 90 tablet 11   Continuous Glucose Monitor Sup MISC To use for continuous glucose monitor 1 each 0   FEROSUL 325 (65 Fe) MG tablet TAKE 1 TABLET BY MOUTH EVERY MORNING 30 tablet 11   gabapentin  (NEURONTIN ) 600 MG tablet TAKE 1 TABLET BY MOUTH 3 TIMES DAILY 270 tablet 11   glimepiride  (AMARYL ) 4 MG tablet TAKE 1 TABLET BY MOUTH EVERY MORNING 30 tablet 11   metFORMIN  (GLUCOPHAGE ) 1000 MG tablet TAKE 1 TABLET BY MOUTH TWICE DAILY WITH A MEAL 180 tablet 11   naloxone  (NARCAN ) nasal spray 4 mg/0.1 mL 1 Spray As Needed in nostril for accidental overdose- call 911 if used 2 each 0   Omega-3 Fatty Acids (OMEGA 3 FISH OIL ) 1000 MG CAPS 1 capsule.     oxyCODONE -acetaminophen  (PERCOCET) 10-325 MG tablet Take 1 tablet by mouth every 8 (eight) hours as needed. 90 tablet 0   pantoprazole (PROTONIX) 40 MG tablet Take 40 mg by mouth every morning.     valsartan -hydrochlorothiazide  (DIOVAN -HCT) 160-12.5 MG tablet TAKE 1 TABLET BY MOUTH EVERY MORNING 30 tablet 11   No current facility-administered medications on file prior to visit.   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 ~  Jan11:  Last seen by Cards & pacer reprogrammed...     Cardiac pacemaker in situ    Carotid artery disease 07/27/2010   R/O PERIPHERAL VASCULAR DISEASE (ICD-443.9) - on ASA 81mg /d... exam 11/09 shows gr 1-2 sys murmur LSB, but also has prominent bruit to base of right side of his neck w/ right > left Carotid Bruit... ~   CDopplers 11/09 showed severe plaque right ICA, & min plaque on left... no signif ICA stenoses per report. 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 smokes 1/2 - 1ppd x yrs... ~  PFT's 11/08 showed FVC= 3.85 (79%), FEV1= 2.19 (55%), %  1sec= 57, mid-flows= 32% predicted:  all c/w mod airflow obstruction...  ~  CXR 12/10 showed left subclav pacer, mild hyperinflation, NAD...     Depression    Dermatitis, contact    due to poison ivy   Diabetes mellitus (HCC) 02/04/2020   Diabetes mellitus with neuropathy (HCC) 12/20/2016   Dyslipidemia 03/06/2012   Encounter for prostate cancer screening 06/21/2022   Essential hypertension 04/10/2007    HYPERTENSION (ICD-401.9) - prev on Micardis 80mg /d then switched to Losartan 100mg /d, but he lost his job in 2011& couldn't afford Rx;  He called us  2/12 w/ this news & we phoned in Lisinopril  20mg /d... ~  2DEcho 11/09 showed sl incr LVwall thickness, norm LVF w/ EF= 55-60%, no regional wall motion abn, mild DD, mild MR... ~  Myoview  12/09 showed LBBB, no perfusion defects, abn septal motion & EF   GERD 03/26/2008    GERD (ICD-530.81) - prev mild GERD symptoms that improved after cholecystectomy on 2000... uses OTC PPI as needed... ~  he is due for routine screening colonoscopy...       Iron  deficiency anemia due to chronic blood loss 06/21/2022   Mixed hyperlipidemia 06/19/2019   PVD (peripheral vascular disease)    Sciatica 11/18/2019   Tobacco use 02/04/2020   Tobacco use disorder    Past Surgical History:  Procedure Laterality Date   ABDOMINAL AORTOGRAM W/LOWER EXTREMITY N/A 12/14/2020   Procedure: ABDOMINAL AORTOGRAM W/LOWER EXTREMITY;  Surgeon: Serene Gaile ORN, MD;   Location: [facility];  Service: Cardiovascular;  Laterality: N/A;   ABDOMINAL AORTOGRAM W/LOWER EXTREMITY N/A 05/24/2021   Procedure: ABDOMINAL AORTOGRAM W/LOWER EXTREMITY;  Surgeon: Serene Gaile ORN, MD;  Location: [facility];  Service: Cardiovascular;  Laterality: N/A;   ABDOMINAL AORTOGRAM W/LOWER EXTREMITY N/A 07/28/2022   Procedure: ABDOMINAL AORTOGRAM W/LOWER EXTREMITY;  Surgeon: Magda Debby SAILOR, MD;  Location: [facility];  Service: Cardiovascular;  Laterality: N/A;   ABDOMINAL AORTOGRAM W/LOWER EXTREMITY N/A 06/08/2023   Procedure: ABDOMINAL AORTOGRAM W/LOWER EXTREMITY;  Surgeon: Magda Debby SAILOR, MD;  Location: [facility];  Service: Cardiovascular;  Laterality: N/A;   CATARACT EXTRACTION Bilateral    laproscopic cholecystectomy  05/08/1998   by Dr. Cephus   MOUTH SURGERY     PACEMAKER INSERTION  03/08/2008   by Dr. Waddell   PERIPHERAL VASCULAR BALLOON ANGIOPLASTY Left 06/08/2023   Procedure: PERIPHERAL VASCULAR BALLOON ANGIOPLASTY;  Surgeon: Magda Debby SAILOR, MD;  Location: [facility];  Service: Cardiovascular;  Laterality: Left;  SFA   PERIPHERAL VASCULAR INTERVENTION Left 12/14/2020   Procedure: PERIPHERAL VASCULAR INTERVENTION;  Surgeon: Serene Gaile ORN, MD;  Location: [facility];  Service: Cardiovascular;  Laterality: Left;  SFA   PERIPHERAL VASCULAR INTERVENTION Left 05/24/2021   Procedure: PERIPHERAL VASCULAR INTERVENTION;  Surgeon: Serene Gaile ORN, MD;  Location: [facility];  Service: Cardiovascular;  Laterality: Left;   PERIPHERAL VASCULAR INTERVENTION Left 07/28/2022   Procedure: PERIPHERAL VASCULAR INTERVENTION;  Surgeon: Magda Debby SAILOR, MD;  Location: [facility];  Service: Cardiovascular;  Laterality: Left;  Lt SFA   PPM GENERATOR CHANGEOUT N/A 05/25/2021   Procedure: PPM GENERATOR CHANGEOUT;  Surgeon: Inocencio Soyla Lunger, MD;  Location: [facility];  Service: Cardiovascular;  Laterality: N/A;     Family History  Problem Relation Age of Onset   Heart disease Brother    Leukemia Brother    Social History   Socioeconomic History   Marital status: Legally Separated  Spouse name: Not on file   Number of children: 2   Years of education: 2   Highest education level: Not on file  Occupational History   Occupation: unemployed    Comment: works for himself   Occupation: RETIRED FROM [employer]  Tobacco Use   Smoking status: Every Day    Current packs/day: 0.50    Average packs/day: 0.5 packs/day for 25.0 years (12.5 ttl pk-yrs)    Types: Cigarettes    Passive exposure: Never   Smokeless tobacco: Never  Vaping Use   Vaping status: Never Used  Substance and Sexual Activity   Alcohol use: Not Currently    Alcohol/week: 0.0 standard drinks of alcohol   Drug use: No   Sexual activity: Not Currently  Other Topics Concern   Not on file  Social History Narrative   PT HAS VERY VAGUE FAMILY HISTORY - HE WAS RAISED IN [facility]   Social Drivers of Health   Financial Resource Strain: Low Risk  (07/03/2023)   Overall Financial Resource Strain (CARDIA)    Difficulty of Paying Living Expenses: Not hard at all  Food Insecurity: No Food Insecurity (07/03/2023)   Hunger Vital Sign    Worried About Running Out of Food in the Last Year: Never true    Ran Out of Food in the Last Year: Never true  Transportation Needs: No Transportation Needs (07/03/2023)   PRAPARE - Administrator, Civil Service (Medical): No    Lack of Transportation (Non-Medical): No  Physical Activity: Insufficiently Active (07/03/2023)   Exercise Vital Sign    Days of Exercise per Week: 3 days    Minutes of Exercise per Session: 30 min  Stress: No Stress Concern Present (07/03/2023)   [employer] of Occupational Health - Occupational Stress Questionnaire    Feeling of Stress : Not at all  Social Connections: Socially Isolated (07/03/2023)   Social Connection and Isolation Panel     Frequency of Communication with Friends and Family: More than three times a week    Frequency of Social Gatherings with Friends and Family: Twice a week    Attends Religious Services: Never    Database administrator or Organizations: No    Attends Engineer, structural: Never    Marital Status: Separated    Objective:  BP (!) 180/68   Pulse 80   Temp 97.9 F (36.6 C)   Ht 5' 10 (1.778 m)   Wt 149 lb (67.6 kg)   SpO2 98%   BMI 21.38 kg/m      02/07/2024    9:05 AM 01/29/2024    2:18 PM 01/10/2024    1:40 PM  BP/Weight  Systolic BP 180 143 140  Diastolic BP 68 72 60  Wt. (Lbs) 149 147 150  BMI 21.38 kg/m2 21.09 kg/m2 21.52 kg/m2    Physical Exam Vitals and nursing note reviewed.  Constitutional:      Appearance: Normal appearance.  HENT:     Head: Normocephalic and atraumatic.  Cardiovascular:     Rate and Rhythm: Normal rate and regular rhythm.  Pulmonary:     Effort: Pulmonary effort is normal.     Breath sounds: Normal breath sounds.  Musculoskeletal:        General: Normal range of motion.  Skin:    General: Skin is warm.  Neurological:     General: No focal deficit present.     Mental Status: He is alert.  Lab Results  Component Value Date   WBC 11.0 (H) 01/03/2024   HGB 14.8 01/03/2024   HCT 43.2 01/03/2024   PLT 281 01/03/2024   GLUCOSE 66 (L) 10/08/2023   CHOL 100 10/08/2023   TRIG 160 (H) 10/08/2023   HDL 27 (L) 10/08/2023   LDLDIRECT 113.0 12/20/2017   LDLCALC 46 10/08/2023   ALT 16 10/08/2023   AST 17 10/08/2023   NA 139 10/08/2023   K 4.5 10/08/2023   CL 102 10/08/2023   CREATININE 1.04 10/08/2023   BUN 10 10/08/2023   CO2 22 10/08/2023   TSH 1.890 06/21/2022   PSA 0.63 12/20/2017   HGBA1C 6.7 (H) 10/08/2023    Results for orders placed or performed during the hospital encounter of 01/29/24  VAS US  ABI WITH/WO TBI   Collection Time: 01/29/24  2:11 PM  Result Value Ref Range   Right ABI 0.80    Left ABI 0.84    .  Assessment & Plan:   Assessment & Plan Essential hypertension Elevated bp today States he has much better home Bps. Currently on Diovan  hydrochlorothiazide  160-12.5 mg  Advised to continue to monitor home Bps and Report back if persistently elevated.     Other specified diabetes mellitus with diabetic neuropathy, unspecified whether long term insulin use (HCC) Type 2 diabetes mellitus, WELL CONTROLLED WITH COMPLICATIONS OF PAD, NEUROPATHY WITHOUT CURRENT LONG TERM USE OF INSULIN with an A1c of 6.7% in June, improved from 8.4%. Blood glucose monitoring with a smartwatch is inaccurate. - Continue glimepiride  4 mg daily - Continue metformin  1000 mg twice daily - Perform urine test to check for proteinuria - Repeat blood work in three months Orders:   Microalbumin/Creatinine Ratio, Urine  Mixed hyperlipidemia On atorvastatin  40 mg daily and Fish oil  pills.  Peripheral vascular disease in the left leg with well-managed blood flow s/p surgery - Continue atorvastatin  40 mg daily - Continue Plavix  75 mg daily - Follow up with vascular surgery in six months    Iron  deficiency anemia due to chronic blood loss Normal labs last blood work Will defer blood work at this time    Tobacco use disorder Tobacco use disorder with current smoking of a little over half a pack per day, reduced from a full pack. Chantix  was not obtained due to cost. Discussed alternative strategies for smoking reduction. - Encourage continued reduction in smoking - Consider saving money from reduced smoking for future Chantix  purchase        Body mass index is 21.38 kg/m.            No orders of the defined types were placed in this encounter.   Orders Placed This Encounter  Procedures   Microalbumin/Creatinine Ratio, Urine       Follow-up: Return in about 3 months (around 05/09/2024) for chronic disease follow up.  An After Visit Summary was printed and given to the patient.  Velisa Regnier, MD [facility] [phone]

## 2024-02-07 NOTE — Assessment & Plan Note (Addendum)
 Tobacco use disorder with current smoking of a little over half a pack per day, reduced from a full pack. Chantix  was not obtained due to cost. Discussed alternative strategies for smoking reduction. - Encourage continued reduction in smoking - Consider saving money from reduced smoking for future Chantix  purchase

## 2024-02-08 LAB — MICROALBUMIN / CREATININE URINE RATIO
Creatinine, Urine: 108.8 mg/dL
Microalb/Creat Ratio: 241 mg/g{creat} — ABNORMAL HIGH (ref 0–29)
Microalbumin, Urine: 262.1 ug/mL

## 2024-02-11 ENCOUNTER — Ambulatory Visit: Payer: Self-pay

## 2024-02-11 NOTE — Progress Notes (Signed)
 Remote PPM Transmission

## 2024-02-13 ENCOUNTER — Other Ambulatory Visit: Payer: Self-pay | Admitting: Podiatry

## 2024-02-13 MED ORDER — QUTENZA (4 PATCH) 8 % EX KIT: 4.0000 | PACK | Freq: Once | CUTANEOUS | 0 refills | Status: AC

## 2024-02-13 NOTE — Progress Notes (Signed)
 Rx Qutenza sent to [facility] in [place].

## 2024-02-21 ENCOUNTER — Other Ambulatory Visit (HOSPITAL_BASED_OUTPATIENT_CLINIC_OR_DEPARTMENT_OTHER): Payer: Self-pay

## 2024-02-21 MED ORDER — OXYCODONE-ACETAMINOPHEN 10-325 MG PO TABS
1.0000 | ORAL_TABLET | Freq: Three times a day (TID) | ORAL | 0 refills | Status: DC | PRN
  Filled 2024-02-26: qty 90, 30d supply, fill #0

## 2024-02-22 ENCOUNTER — Ambulatory Visit

## 2024-02-22 DIAGNOSIS — I4430 Unspecified atrioventricular block: Secondary | ICD-10-CM | POA: Diagnosis not present

## 2024-02-26 ENCOUNTER — Other Ambulatory Visit (HOSPITAL_BASED_OUTPATIENT_CLINIC_OR_DEPARTMENT_OTHER): Payer: Self-pay

## 2024-02-26 LAB — CUP PACEART REMOTE DEVICE CHECK
Battery Remaining Longevity: 127 mo
Battery Voltage: 3.01 V
Brady Statistic AP VP Percent: 2.53 %
Brady Statistic AP VS Percent: 0 %
Brady Statistic AS VP Percent: 97.4 %
Brady Statistic AS VS Percent: 0.07 %
Brady Statistic RA Percent Paced: 2.57 %
Brady Statistic RV Percent Paced: 99.93 %
Date Time Interrogation Session: 20251017013809
Implantable Lead Connection Status: 753985
Implantable Lead Connection Status: 753985
Implantable Lead Implant Date: 20091120
Implantable Lead Implant Date: 20091120
Implantable Lead Location: 753859
Implantable Lead Location: 753860
Implantable Lead Model: 5076
Implantable Lead Model: 5076
Implantable Pulse Generator Implant Date: 20230118
Lead Channel Impedance Value: 342 Ohm
Lead Channel Impedance Value: 380 Ohm
Lead Channel Impedance Value: 779 Ohm
Lead Channel Impedance Value: 836 Ohm
Lead Channel Pacing Threshold Amplitude: 0.625 V
Lead Channel Pacing Threshold Amplitude: 0.75 V
Lead Channel Pacing Threshold Pulse Width: 0.4 ms
Lead Channel Pacing Threshold Pulse Width: 0.4 ms
Lead Channel Sensing Intrinsic Amplitude: 2.375 mV
Lead Channel Sensing Intrinsic Amplitude: 2.375 mV
Lead Channel Sensing Intrinsic Amplitude: 6.75 mV
Lead Channel Setting Pacing Amplitude: 1.5 V
Lead Channel Setting Pacing Amplitude: 2 V
Lead Channel Setting Pacing Pulse Width: 0.4 ms
Lead Channel Setting Sensing Sensitivity: 0.9 mV
Zone Setting Status: 755011
Zone Setting Status: 755011

## 2024-02-27 ENCOUNTER — Ambulatory Visit: Payer: Self-pay | Admitting: Internal Medicine

## 2024-02-28 NOTE — Progress Notes (Signed)
 Remote PPM Transmission

## 2024-03-03 ENCOUNTER — Telehealth: Payer: Self-pay

## 2024-03-03 NOTE — Telephone Encounter (Signed)
 PA request received for Qutenza. PA submitted through CoverMyMeds and waiting on response.  Andrew Wiggins  (Key: [id number])

## 2024-03-25 ENCOUNTER — Other Ambulatory Visit: Payer: Self-pay

## 2024-03-27 ENCOUNTER — Other Ambulatory Visit (HOSPITAL_BASED_OUTPATIENT_CLINIC_OR_DEPARTMENT_OTHER): Payer: Self-pay

## 2024-03-27 MED ORDER — OXYCODONE-ACETAMINOPHEN 10-325 MG PO TABS
1.0000 | ORAL_TABLET | Freq: Four times a day (QID) | ORAL | 0 refills | Status: DC | PRN
  Filled 2024-03-27: qty 120, 30d supply, fill #0

## 2024-04-23 ENCOUNTER — Ambulatory Visit: Admitting: Podiatry

## 2024-04-23 ENCOUNTER — Other Ambulatory Visit (HOSPITAL_BASED_OUTPATIENT_CLINIC_OR_DEPARTMENT_OTHER): Payer: Self-pay

## 2024-04-23 MED ORDER — OXYCODONE-ACETAMINOPHEN 10-325 MG PO TABS: 1.0000 | ORAL_TABLET | Freq: Four times a day (QID) | ORAL | 0 refills | Status: DC | PRN

## 2024-04-24 ENCOUNTER — Other Ambulatory Visit (HOSPITAL_BASED_OUTPATIENT_CLINIC_OR_DEPARTMENT_OTHER): Payer: Self-pay

## 2024-04-24 MED ORDER — OXYCODONE-ACETAMINOPHEN 10-325 MG PO TABS
1.0000 | ORAL_TABLET | Freq: Four times a day (QID) | ORAL | 0 refills | Status: DC | PRN
  Filled 2024-04-25: qty 120, 30d supply, fill #0

## 2024-04-25 ENCOUNTER — Other Ambulatory Visit (HOSPITAL_BASED_OUTPATIENT_CLINIC_OR_DEPARTMENT_OTHER): Payer: Self-pay

## 2024-05-14 ENCOUNTER — Other Ambulatory Visit: Payer: Self-pay

## 2024-05-14 DIAGNOSIS — E782 Mixed hyperlipidemia: Secondary | ICD-10-CM

## 2024-05-16 ENCOUNTER — Ambulatory Visit (INDEPENDENT_AMBULATORY_CARE_PROVIDER_SITE_OTHER)

## 2024-05-16 VITALS — BP 180/80 | HR 100 | Temp 98.5°F | Ht 70.0 in | Wt 151.4 lb

## 2024-05-16 DIAGNOSIS — J4489 Other specified chronic obstructive pulmonary disease: Secondary | ICD-10-CM | POA: Diagnosis not present

## 2024-05-16 DIAGNOSIS — Z95811 Presence of heart assist device: Secondary | ICD-10-CM

## 2024-05-16 DIAGNOSIS — I70249 Atherosclerosis of native arteries of left leg with ulceration of unspecified site: Secondary | ICD-10-CM

## 2024-05-16 DIAGNOSIS — D500 Iron deficiency anemia secondary to blood loss (chronic): Secondary | ICD-10-CM

## 2024-05-16 DIAGNOSIS — E782 Mixed hyperlipidemia: Secondary | ICD-10-CM

## 2024-05-16 DIAGNOSIS — I70222 Atherosclerosis of native arteries of extremities with rest pain, left leg: Secondary | ICD-10-CM | POA: Diagnosis not present

## 2024-05-16 DIAGNOSIS — E1340 Other specified diabetes mellitus with diabetic neuropathy, unspecified: Secondary | ICD-10-CM

## 2024-05-16 DIAGNOSIS — I10 Essential (primary) hypertension: Secondary | ICD-10-CM

## 2024-05-16 NOTE — Assessment & Plan Note (Signed)
 COPD with chronic bronchitis COPD with chronic bronchitis, likely exacerbated by smoking. Reports using an inhaler. Recent CT scan showed no significant lung nodules or cancer, but emphysema is present. - Continue using ventolin  inhaler as prescribed. Not on a maintenance inhaler but does not seem to feel the need for it.  - Encouraged smoking cessation to improve respiratory health.

## 2024-05-16 NOTE — Patient Instructions (Signed)
" °  VISIT SUMMARY: During your visit, we addressed your congestion, chronic conditions including peripheral vascular disease, diabetes, hypertension, COPD, and hyperlipidemia. We also discussed your smoking habits and chronic pain management.  YOUR PLAN: UPPER RESPIRATORY SYMPTOMS: You have been experiencing congestion for the past couple of weeks. -Continue using Mucinex for cough and cold as it has shown slight improvement. = TYPE 2 DIABETES MELLITUS WITH DIABETIC NEUROPATHY: You have type 2 diabetes with diabetic neuropathy, primarily affecting your left foot. Your current A1c is 6.7, indicating good control. -Continue taking metformin  and glimepiride  as prescribed. -Consider reducing gabapentin  if neuropathic pain worsens after holding off. -Add Tylenol  500 mg twice daily for additional pain relief.  ESSENTIAL HYPERTENSION: You have episodes of elevated blood pressure, particularly in clinical settings. -Monitor your blood pressure at home regularly.  COPD WITH CHRONIC BRONCHITIS: You have COPD with chronic bronchitis, likely exacerbated by smoking. -Continue using your inhaler as prescribed. -You are encouraged to quit smoking to improve your respiratory health.  MIXED HYPERLIPIDEMIA: You have thickened blood vessels noted on imaging and are currently on atorvastatin  40 mg daily and fish oil  supplements. -Blood work has been ordered to assess your current cholesterol levels. -We will adjust your atorvastatin  dose if cholesterol levels are elevated.  GENERAL HEALTH MAINTENANCE: Routine health maintenance was discussed, including the importance of monitoring blood pressure and cholesterol levels. -Blood work was performed today to assess your current health status. -A follow-up appointment is scheduled in three months.                      Contains text generated by Abridge.                                 Contains text generated by  Abridge.   "

## 2024-05-16 NOTE — Assessment & Plan Note (Signed)
 Essential hypertension Episodes of elevated blood pressure, particularly in clinical settings. Blood pressure was high during the last visit but decreased upon recheck.   CURRENTLY ON VALSARTAN  160- hydrochlorothiazide  12.5 MG. Home blood pressure monitoring is recommended to assess true control. - Monitor blood pressure at home regularly. 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   WILL CONSIDER ADDING AMLODIPINE  5 MG IF PERSISTENTLY ELEVATED BP

## 2024-05-16 NOTE — Assessment & Plan Note (Signed)
" °  Type 2 diabetes mellitus with diabetic neuropathy Type 2 diabetes mellitus CONTROLLED with diabetic neuropathy WITHOUT CURRENT LONG TERM USE OF INSULIN,  neuropathy primarily affecting the left foot but could also be partly his peripheral vascular disease causing the symptoms. last A1c is 6.7, indicating good control. Gabapentin  is used for neuropathic pain, but its efficacy is questioned. Percocet is used for hip pain, with potential for constipation. - Continue metformin  and glimepiride  as prescribed. - Consider reducing gabapentin  if neuropathic pain worsens after holding off. - Add Tylenol  500 mg twice daily for additional pain relief. "

## 2024-05-16 NOTE — Assessment & Plan Note (Signed)
 Mixed hyperlipidemia Thickened blood vessels noted on imaging. Currently on atorvastatin  40 mg daily and fish oil  supplements. Awaiting current cholesterol levels to determine if medication adjustment is needed. - Ordered blood work to assess current cholesterol levels. - Will adjust atorvastatin  dose if cholesterol levels are elevated.

## 2024-05-16 NOTE — Progress Notes (Signed)
 "  Established Patient Office Visit  Patient ID: Andrew Wiggins, male    DOB: [birth year]  Age: 69 y.o. MRN: [id number] PCP: Millisa Giarrusso, MD  Chief Complaint  Patient presents with   Medical Management of Chronic Issues    Subjective:     HPI  Discussed the use of AI scribe software for clinical note transcription with the patient, who gave verbal consent to proceed.  History of Present Illness   Andrew Wiggins is a 69 year old male with peripheral vascular disease and COPD who presents with congestion and follow-up on chronic conditions.  Upper respiratory symptoms - Congestion for the past couple of weeks - Using Mucinex for cough and cold with slight improvement  Peripheral vascular disease and lower extremity neuropathy - History of peripheral vascular disease with prior stent changes in left leg to improve blood flow - Left foot feels 'round' at the bottom with reduced sensation - Marked sensitivity to touch in left foot - Neuropathy in left foot - Takes gabapentin  600 mg three times daily for nerve pain  Cardiovascular status - Pacemaker in place - Blood pressure tends to be elevated during office visits but normalizes afterwards - Occasionally checks blood pressure at home - Has not seen a vascular doctor or cardiologist recently  Tobacco use - Smokes approximately three-quarters of a pack of cigarettes per day  Chronic obstructive pulmonary disease (copd) and emphysema - History of COPD and emphysema - CT scan in March showed no lung nodules or cancer, but thickened blood vessels due to cholesterol - Uses a breathing device  Hyperlipidemia - Takes atorvastatin  40 mg daily and fish oil  pills for cholesterol management  Diabetes mellitus - Takes metformin  and glimepiride  for glycemic control - Last hemoglobin A1c was 6.7  Chronic pain and opioid use - Takes oxycodone  for hip pain, increased to four times daily - Occasional constipation from pain  medication  Vision - Good vision       Patient Active Problem List   Diagnosis Date Noted   Critical limb ischemia of left lower extremity (HCC) 05/16/2024   Long term (current) use of opiate analgesic 02/07/2024   Lumbosacral spondylosis without myelopathy 02/07/2024   Multiple actinic keratoses 01/10/2024   Chronic pain syndrome 10/08/2023   Lumbar spondylosis 10/08/2023   Uncontrolled type 2 diabetes mellitus with hyperglycemia, without long-term current use of insulin (HCC) 10/08/2023   Presence of heart assist device (HCC) 10/08/2023   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) 10/08/2023   Encounter for Medicare annual wellness exam 07/03/2023   Controlled type 2 diabetes mellitus with complication, without long-term current use of insulin (HCC) 07/03/2023   Anemia    Arthritis    Depression    Iron  deficiency anemia due to chronic blood loss 06/21/2022   Encounter for prostate cancer screening 06/21/2022   AV block    Diabetes mellitus (HCC) 02/04/2020   Tobacco use 02/04/2020   Dermatitis, contact    PVD (peripheral vascular disease)    Sciatica 11/18/2019   Mixed hyperlipidemia 06/19/2019   Diabetes mellitus with neuropathy (HCC) 12/20/2016   Allergic rhinitis 09/04/2012   Dyslipidemia 03/06/2012   Carotid artery disease 07/27/2010   Cardiac pacemaker in situ 11/26/2008   Cardiac conduction disorder 11/26/2008   CIGARETTE SMOKER 03/26/2008   COPD (chronic obstructive pulmonary disease) with chronic bronchitis (HCC) 03/26/2008   GERD 03/26/2008   Essential hypertension 04/10/2007   Past Medical History:  Diagnosis Date  Allergic rhinitis 09/04/2012   Anemia    Arthritis    AV block    history of   Cardiac conduction disorder 11/26/2008    Hx of AV BLOCK (ICD-426.9) & CARDIAC PACEMAKER IN SITU (ICD-V45.01) - he was adm 1/10 w/ symptomatic bradycardias & 2:1 heart block requiring pacemaker- followed by DrTaylor... ~  Jan11:  Last seen  by Cards & pacer reprogrammed...     Cardiac pacemaker in situ    Carotid artery disease 07/27/2010   R/O PERIPHERAL VASCULAR DISEASE (ICD-443.9) - on ASA 81mg /d... exam 11/09 shows gr 1-2 sys murmur LSB, but also has prominent bruit to base of right side of his neck w/ right > left Carotid Bruit... ~  CDopplers 11/09 showed severe plaque right ICA, & min plaque on left... no signif ICA stenoses per report. 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 smokes 1/2 - 1ppd x yrs... ~  PFT's 11/08 showed FVC= 3.85 (79%), FEV1= 2.19 (55%), %1sec= 57, mid-flows= 32% predicted:  all c/w mod airflow obstruction...  ~  CXR 12/10 showed left subclav pacer, mild hyperinflation, NAD...     Depression    Dermatitis, contact    due to poison ivy   Diabetes mellitus (HCC) 02/04/2020   Diabetes mellitus with neuropathy (HCC) 12/20/2016   Dyslipidemia 03/06/2012   Encounter for prostate cancer screening 06/21/2022   Essential hypertension 04/10/2007    HYPERTENSION (ICD-401.9) - prev on Micardis 80mg /d then switched to Losartan 100mg /d, but he lost his job in 2011& couldn't afford Rx;  He called us  2/12 w/ this news & we phoned in Lisinopril  20mg /d... ~  2DEcho 11/09 showed sl incr LVwall thickness, norm LVF w/ EF= 55-60%, no regional wall motion abn, mild DD, mild MR... ~  Myoview  12/09 showed LBBB, no perfusion defects, abn septal motion & EF   GERD 03/26/2008    GERD (ICD-530.81) - prev mild GERD symptoms that improved after cholecystectomy on 2000... uses OTC PPI as needed... ~  he is due for routine screening colonoscopy...       Iron  deficiency anemia due to chronic blood loss 06/21/2022   Mixed hyperlipidemia 06/19/2019    PVD (peripheral vascular disease)    Sciatica 11/18/2019   Tobacco use 02/04/2020   Tobacco use disorder    Past Surgical History:  Procedure Laterality Date   ABDOMINAL AORTOGRAM W/LOWER EXTREMITY N/A 12/14/2020   Procedure: ABDOMINAL AORTOGRAM W/LOWER EXTREMITY;  Surgeon: Serene Gaile ORN, MD;  Location: [facility];  Service: Cardiovascular;  Laterality: N/A;   ABDOMINAL AORTOGRAM W/LOWER EXTREMITY N/A 05/24/2021   Procedure: ABDOMINAL AORTOGRAM W/LOWER EXTREMITY;  Surgeon: Serene Gaile ORN, MD;  Location: [facility];  Service: Cardiovascular;  Laterality: N/A;   ABDOMINAL AORTOGRAM W/LOWER EXTREMITY N/A 07/28/2022   Procedure: ABDOMINAL AORTOGRAM W/LOWER EXTREMITY;  Surgeon: Magda Debby SAILOR, MD;  Location: [facility];  Service: Cardiovascular;  Laterality: N/A;   ABDOMINAL AORTOGRAM W/LOWER EXTREMITY N/A 06/08/2023   Procedure: ABDOMINAL AORTOGRAM W/LOWER EXTREMITY;  Surgeon: Magda Debby SAILOR, MD;  Location: [facility];  Service: Cardiovascular;  Laterality: N/A;   CATARACT EXTRACTION Bilateral    laproscopic cholecystectomy  05/08/1998   by Dr. Cephus  MOUTH SURGERY     PACEMAKER INSERTION  03/08/2008   by Dr. Waddell   PERIPHERAL VASCULAR BALLOON ANGIOPLASTY Left 06/08/2023   Procedure: PERIPHERAL VASCULAR BALLOON ANGIOPLASTY;  Surgeon: Magda Debby SAILOR, MD;  Location: [facility];  Service: Cardiovascular;  Laterality: Left;  SFA   PERIPHERAL VASCULAR INTERVENTION Left 12/14/2020   Procedure: PERIPHERAL VASCULAR INTERVENTION;  Surgeon: Serene Gaile ORN, MD;  Location: [facility];  Service: Cardiovascular;  Laterality: Left;  SFA   PERIPHERAL VASCULAR INTERVENTION Left 05/24/2021   Procedure: PERIPHERAL VASCULAR INTERVENTION;  Surgeon: Serene Gaile ORN, MD;  Location: [facility];  Service: Cardiovascular;  Laterality: Left;   PERIPHERAL VASCULAR INTERVENTION Left 07/28/2022   Procedure: PERIPHERAL VASCULAR INTERVENTION;  Surgeon:  Magda Debby SAILOR, MD;  Location: [facility];  Service: Cardiovascular;  Laterality: Left;  Lt SFA   PPM GENERATOR CHANGEOUT N/A 05/25/2021   Procedure: PPM GENERATOR CHANGEOUT;  Surgeon: Inocencio Soyla Lunger, MD;  Location: [facility];  Service: Cardiovascular;  Laterality: N/A;   Social History   Socioeconomic History   Marital status: Legally Separated    Spouse name: Not on file   Number of children: 2   Years of education: 11   Highest education level: 11th grade  Occupational History   Occupation: unemployed    Comment: works for himself   Occupation: RETIRED FROM [employer]  Tobacco Use   Smoking status: Every Day    Current packs/day: 0.50    Average packs/day: 0.5 packs/day for 25.0 years (12.5 ttl pk-yrs)    Types: Cigarettes    Passive exposure: Never   Smokeless tobacco: Never  Vaping Use   Vaping status: Never Used  Substance and Sexual Activity   Alcohol use: Not Currently    Alcohol/week: 0.0 standard drinks of alcohol   Drug use: No   Sexual activity: Not Currently  Other Topics Concern   Not on file  Social History Narrative   PT HAS VERY VAGUE FAMILY HISTORY - HE WAS RAISED IN [facility]   Social Drivers of Health   Tobacco Use: High Risk (05/16/2024)   Patient History    Smoking Tobacco Use: Every Day    Smokeless Tobacco Use: Never    Passive Exposure: Never  Financial Resource Strain: Medium Risk (05/12/2024)   Overall Financial Resource Strain (CARDIA)    Difficulty of Paying Living Expenses: Somewhat hard  Food Insecurity: No Food Insecurity (05/12/2024)   Epic    Worried About Programme Researcher, Broadcasting/film/video in the Last Year: Never true    Ran Out of Food in the Last Year: Never true  Transportation Needs: No Transportation Needs (05/12/2024)   Epic    Lack of Transportation (Medical): No    Lack of Transportation (Non-Medical): No  Physical Activity: Sufficiently Active (05/12/2024)   Exercise Vital Sign    Days of Exercise per Week: 4  days    Minutes of Exercise per Session: 60 min  Stress: No Stress Concern Present (05/12/2024)   [employer] of Occupational Health - Occupational Stress Questionnaire    Feeling of Stress: Only a little  Social Connections: Socially Isolated (05/12/2024)   Social Connection and Isolation Panel    Frequency of Communication with Friends and Family: Twice a week    Frequency of Social Gatherings with Friends and Family: Once a week    Attends Religious Services: Never    Database Administrator or Organizations: No    Attends  Club or Organization Meetings: Not on file    Marital Status: Separated  Intimate Partner Violence: Not At Risk (07/03/2023)   Humiliation, Afraid, Rape, and Kick questionnaire    Fear of Current or Ex-Partner: No    Emotionally Abused: No    Physically Abused: No    Sexually Abused: No  Depression (PHQ2-9): Low Risk (05/16/2024)   Depression (PHQ2-9)    PHQ-2 Score: 0  Alcohol Screen: Low Risk (07/03/2023)   Alcohol Screen    Last Alcohol Screening Score (AUDIT): 0  Housing: Low Risk (05/12/2024)   Epic    Unable to Pay for Housing in the Last Year: No    Number of Times Moved in the Last Year: 0    Homeless in the Last Year: No  Utilities: Not At Risk (07/03/2023)   AHC Utilities    Threatened with loss of utilities: No  Health Literacy: Adequate Health Literacy (07/03/2023)   B1300 Health Literacy    Frequency of need for help with medical instructions: Never   Allergies[1]  Review of Systems  Constitutional: Negative.   Eyes: Negative.   Respiratory: Negative.    Cardiovascular: Negative.   Gastrointestinal: Negative.   Genitourinary: Negative.   Musculoskeletal:  Positive for back pain and myalgias.  Skin: Negative.   Psychiatric/Behavioral: Negative.        Objective:     BP (!) 180/80   Pulse 100   Temp 98.5 F (36.9 C)   Ht 5' 10 (1.778 m)   Wt 151 lb 6.4 oz (68.7 kg)   SpO2 98%   BMI 21.72 kg/m  BP Readings from Last 3  Encounters:  05/16/24 (!) 180/80  02/07/24 (!) 180/68  01/29/24 (!) 143/72      Physical Exam Vitals and nursing note reviewed.  Constitutional:      Appearance: Normal appearance.  HENT:     Head: Normocephalic and atraumatic.  Cardiovascular:     Rate and Rhythm: Normal rate and regular rhythm.  Pulmonary:     Breath sounds: Wheezing and rales present.  Musculoskeletal:        General: Normal range of motion.     Cervical back: Normal range of motion.  Skin:    General: Skin is warm.  Neurological:     General: No focal deficit present.     Mental Status: He is alert.      No results found for any visits on 05/16/24.    The ASCVD Risk score (Arnett DK, et al., 2019) failed to calculate for the following reasons:   The valid total cholesterol range is 130 to 320 mg/dL    Assessment & Plan:   Problem List Items Addressed This Visit     Essential hypertension   Essential hypertension Episodes of elevated blood pressure, particularly in clinical settings. Blood pressure was high during the last visit but decreased upon recheck.   CURRENTLY ON VALSARTAN  160- hydrochlorothiazide  12.5 MG. Home blood pressure monitoring is recommended to assess true control. - Monitor blood pressure at home regularly. 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   WILL CONSIDER ADDING AMLODIPINE  5 MG IF PERSISTENTLY ELEVATED BP      Relevant Orders   CBC with Differential   Comprehensive metabolic panel with GFR   COPD (chronic obstructive pulmonary disease) with chronic bronchitis (HCC)   COPD with chronic bronchitis COPD with chronic bronchitis, likely exacerbated by smoking. Reports  using an inhaler. Recent CT scan showed no significant lung nodules or cancer, but emphysema is present. - Continue using ventolin  inhaler as prescribed. Not on a maintenance inhaler but does not seem to feel the need for  it.  - Encouraged smoking cessation to improve respiratory health.      Diabetes mellitus with neuropathy (HCC)    Type 2 diabetes mellitus with diabetic neuropathy Type 2 diabetes mellitus CONTROLLED with diabetic neuropathy WITHOUT CURRENT LONG TERM USE OF INSULIN,  neuropathy primarily affecting the left foot but could also be partly his peripheral vascular disease causing the symptoms. last A1c is 6.7, indicating good control. Gabapentin  is used for neuropathic pain, but its efficacy is questioned. Percocet is used for hip pain, with potential for constipation. - Continue metformin  and glimepiride  as prescribed. - Consider reducing gabapentin  if neuropathic pain worsens after holding off. - Add Tylenol  500 mg twice daily for additional pain relief.      Relevant Orders   Hemoglobin A1c   Mixed hyperlipidemia - Primary   Mixed hyperlipidemia Thickened blood vessels noted on imaging. Currently on atorvastatin  40 mg daily and fish oil  supplements. Awaiting current cholesterol levels to determine if medication adjustment is needed. - Ordered blood work to assess current cholesterol levels. - Will adjust atorvastatin  dose if cholesterol levels are elevated.      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  Has peripheral vascular disease. On Aspirin  81 mg, plavix  75 mg indefinitely and lipitor 40 mg daily STILL ACTIVE SMOKER. STRONGLY RECOMMENDED TO CUT BACK AND QUIT      Critical limb ischemia of left lower extremity (HCC)   Critical limb ischemia of left lower extremity Chronic critical limb ischemia in the left lower extremity with previous surgical  interventions for stent placement. Reports sensitivity and lack of sensation in the foot. Scheduled for an ultrasound to assess blood flow maintenance. - Continue with scheduled ultrasound to assess blood flow in the left lower extremity. CONTINUE CURRENT MEDS AND ENCOURAGED SMOKING CESSATION         General Health Maintenance Routine health maintenance discussed, including the importance of monitoring blood pressure and cholesterol levels. - Performed blood work today to assess current health status. - Scheduled follow-up appointment in three months.      Return in about 3 months (around 08/14/2024) for chronic disease follow up.   I personally spent a total of 46 minutes in the care of the patient today including getting/reviewing separately obtained history, counseling and educating, placing orders, independently interpreting results, and coordinating care.   Kalief Kattner, MD  [facility]      [1] No Known Allergies  "

## 2024-05-16 NOTE — Assessment & Plan Note (Signed)
 Critical limb ischemia of left lower extremity Chronic critical limb ischemia in the left lower extremity with previous surgical interventions for stent placement. Reports sensitivity and lack of sensation in the foot. Scheduled for an ultrasound to assess blood flow maintenance. - Continue with scheduled ultrasound to assess blood flow in the left lower extremity. CONTINUE CURRENT MEDS AND ENCOURAGED SMOKING CESSATION

## 2024-05-16 NOTE — Assessment & Plan Note (Signed)
 No active ulcer.  Has peripheral vascular disease. On Aspirin  81 mg, plavix  75 mg indefinitely and lipitor 40 mg daily STILL ACTIVE SMOKER. STRONGLY RECOMMENDED TO CUT BACK AND QUIT

## 2024-05-16 NOTE — Assessment & Plan Note (Signed)
 Pacemaker present, placed for complete AV block

## 2024-05-17 ENCOUNTER — Ambulatory Visit: Payer: Self-pay

## 2024-05-17 LAB — CBC WITH DIFFERENTIAL/PLATELET
Basophils Absolute: 0 x10E3/uL (ref 0.0–0.2)
Basos: 0 %
EOS (ABSOLUTE): 0.1 x10E3/uL (ref 0.0–0.4)
Eos: 1 %
Hematocrit: 42.2 % (ref 37.5–51.0)
Hemoglobin: 13.7 g/dL (ref 13.0–17.7)
Immature Grans (Abs): 0.1 x10E3/uL (ref 0.0–0.1)
Immature Granulocytes: 1 %
Lymphocytes Absolute: 2.2 x10E3/uL (ref 0.7–3.1)
Lymphs: 24 %
MCH: 32.9 pg (ref 26.6–33.0)
MCHC: 32.5 g/dL (ref 31.5–35.7)
MCV: 101 fL — ABNORMAL HIGH (ref 79–97)
Monocytes Absolute: 0.8 x10E3/uL (ref 0.1–0.9)
Monocytes: 9 %
Neutrophils Absolute: 6.1 x10E3/uL (ref 1.4–7.0)
Neutrophils: 65 %
Platelets: 263 x10E3/uL (ref 150–450)
RBC: 4.17 x10E6/uL (ref 4.14–5.80)
RDW: 13.8 % (ref 11.6–15.4)
WBC: 9.3 x10E3/uL (ref 3.4–10.8)

## 2024-05-17 LAB — COMPREHENSIVE METABOLIC PANEL WITH GFR
ALT: 20 IU/L (ref 0–44)
AST: 19 IU/L (ref 0–40)
Albumin: 4.4 g/dL (ref 3.9–4.9)
Alkaline Phosphatase: 95 IU/L (ref 47–123)
BUN/Creatinine Ratio: 10 (ref 10–24)
BUN: 10 mg/dL (ref 8–27)
Bilirubin Total: 0.5 mg/dL (ref 0.0–1.2)
CO2: 24 mmol/L (ref 20–29)
Calcium: 9.8 mg/dL (ref 8.6–10.2)
Chloride: 102 mmol/L (ref 96–106)
Creatinine, Ser: 1.01 mg/dL (ref 0.76–1.27)
Globulin, Total: 2.6 g/dL (ref 1.5–4.5)
Glucose: 89 mg/dL (ref 70–99)
Potassium: 4.8 mmol/L (ref 3.5–5.2)
Sodium: 141 mmol/L (ref 134–144)
Total Protein: 7 g/dL (ref 6.0–8.5)
eGFR: 81 mL/min/1.73

## 2024-05-17 LAB — IRON,TIBC AND FERRITIN PANEL
Ferritin: 49 ng/mL (ref 30–400)
Iron Saturation: 58 % — ABNORMAL HIGH (ref 15–55)
Iron: 216 ug/dL — ABNORMAL HIGH (ref 38–169)
Total Iron Binding Capacity: 371 ug/dL (ref 250–450)
UIBC: 155 ug/dL (ref 111–343)

## 2024-05-17 LAB — HEMOGLOBIN A1C
Est. average glucose Bld gHb Est-mCnc: 148 mg/dL
Hgb A1c MFr Bld: 6.8 % — ABNORMAL HIGH (ref 4.8–5.6)

## 2024-05-20 ENCOUNTER — Other Ambulatory Visit (HOSPITAL_BASED_OUTPATIENT_CLINIC_OR_DEPARTMENT_OTHER): Payer: Self-pay

## 2024-05-20 MED ORDER — OXYCODONE-ACETAMINOPHEN 10-325 MG PO TABS
1.0000 | ORAL_TABLET | Freq: Four times a day (QID) | ORAL | 0 refills | Status: AC | PRN
  Filled 2024-05-20 – 2024-05-23 (×2): qty 120, 30d supply, fill #0

## 2024-05-23 ENCOUNTER — Ambulatory Visit

## 2024-05-23 ENCOUNTER — Other Ambulatory Visit (HOSPITAL_BASED_OUTPATIENT_CLINIC_OR_DEPARTMENT_OTHER): Payer: Self-pay

## 2024-06-12 ENCOUNTER — Ambulatory Visit

## 2024-06-13 ENCOUNTER — Telehealth: Payer: Self-pay | Admitting: Cardiology

## 2024-06-13 LAB — CUP PACEART REMOTE DEVICE CHECK
Battery Remaining Longevity: 123 mo
Battery Voltage: 3.01 V
Brady Statistic AP VP Percent: 1.85 %
Brady Statistic AP VS Percent: 0 %
Brady Statistic AS VP Percent: 98.12 %
Brady Statistic AS VS Percent: 0.03 %
Brady Statistic RA Percent Paced: 1.87 %
Brady Statistic RV Percent Paced: 99.97 %
Date Time Interrogation Session: 20260205172236
Implantable Lead Connection Status: 753985
Implantable Lead Connection Status: 753985
Implantable Lead Implant Date: 20091120
Implantable Lead Implant Date: 20091120
Implantable Lead Location: 753859
Implantable Lead Location: 753860
Implantable Lead Model: 5076
Implantable Lead Model: 5076
Implantable Pulse Generator Implant Date: 20230118
Lead Channel Impedance Value: 342 Ohm
Lead Channel Impedance Value: 380 Ohm
Lead Channel Impedance Value: 760 Ohm
Lead Channel Impedance Value: 836 Ohm
Lead Channel Pacing Threshold Amplitude: 0.625 V
Lead Channel Pacing Threshold Amplitude: 0.625 V
Lead Channel Pacing Threshold Pulse Width: 0.4 ms
Lead Channel Pacing Threshold Pulse Width: 0.4 ms
Lead Channel Sensing Intrinsic Amplitude: 2.875 mV
Lead Channel Sensing Intrinsic Amplitude: 2.875 mV
Lead Channel Sensing Intrinsic Amplitude: 6.75 mV
Lead Channel Setting Pacing Amplitude: 1.5 V
Lead Channel Setting Pacing Amplitude: 2 V
Lead Channel Setting Pacing Pulse Width: 0.4 ms
Lead Channel Setting Sensing Sensitivity: 0.9 mV
Zone Setting Status: 755011
Zone Setting Status: 755011

## 2024-06-13 NOTE — Telephone Encounter (Signed)
  1. Has your device fired? No   2. Is you device beeping? No   3. Are you experiencing draining or swelling at device site? No   4. Are you calling to see if we received your device transmission? Yes  5. Have you passed out? No     Please route to Device Clinic Pool  

## 2024-06-13 NOTE — Telephone Encounter (Signed)
 Called patient back and let him know transmission was received and everything was normal.  Dr. Inocencio will review and post his results to his my chart page once he has released them.  Patient to call back if any other questions or concerns.

## 2024-07-04 ENCOUNTER — Ambulatory Visit: Admitting: Oncology

## 2024-07-04 ENCOUNTER — Other Ambulatory Visit

## 2024-07-15 ENCOUNTER — Encounter (HOSPITAL_COMMUNITY)

## 2024-07-15 ENCOUNTER — Ambulatory Visit

## 2024-08-22 ENCOUNTER — Ambulatory Visit

## 2024-09-11 ENCOUNTER — Ambulatory Visit

## 2024-11-21 ENCOUNTER — Ambulatory Visit

## 2024-12-11 ENCOUNTER — Ambulatory Visit

## 2025-02-20 ENCOUNTER — Ambulatory Visit

## 2025-03-12 ENCOUNTER — Ambulatory Visit

## 2025-06-11 ENCOUNTER — Ambulatory Visit

## 2025-09-10 ENCOUNTER — Ambulatory Visit

## 2025-12-10 ENCOUNTER — Ambulatory Visit

## 2026-03-11 ENCOUNTER — Ambulatory Visit
